# Patient Record
Sex: Male | Born: 1945 | Race: White | Hispanic: No | Marital: Single
Health system: Midwestern US, Community
[De-identification: ages and names within clinical notes are randomized; demographics above are authoritative.]

## PROBLEM LIST (undated history)

## (undated) ENCOUNTER — Emergency Department (HOSPITAL_COMMUNITY): Payer: Medicare Other | Source: Home / Self Care

## (undated) DIAGNOSIS — K219 Gastro-esophageal reflux disease without esophagitis: Secondary | ICD-10-CM

## (undated) DIAGNOSIS — K631 Perforation of intestine (nontraumatic): Secondary | ICD-10-CM

## (undated) DIAGNOSIS — E785 Hyperlipidemia, unspecified: Secondary | ICD-10-CM

## (undated) DIAGNOSIS — M109 Gout, unspecified: Secondary | ICD-10-CM

## (undated) DIAGNOSIS — J449 Chronic obstructive pulmonary disease, unspecified: Secondary | ICD-10-CM

## (undated) DIAGNOSIS — K635 Polyp of colon: Secondary | ICD-10-CM

## (undated) DIAGNOSIS — I4891 Unspecified atrial fibrillation: Secondary | ICD-10-CM

## (undated) DIAGNOSIS — C3490 Malignant neoplasm of unspecified part of unspecified bronchus or lung: Secondary | ICD-10-CM

## (undated) DIAGNOSIS — K5790 Diverticulosis of intestine, part unspecified, without perforation or abscess without bleeding: Secondary | ICD-10-CM

## (undated) DIAGNOSIS — E119 Type 2 diabetes mellitus without complications: Secondary | ICD-10-CM

## (undated) DIAGNOSIS — I2510 Atherosclerotic heart disease of native coronary artery without angina pectoris: Secondary | ICD-10-CM

## (undated) DIAGNOSIS — D649 Anemia, unspecified: Secondary | ICD-10-CM

## (undated) DIAGNOSIS — R911 Solitary pulmonary nodule: Secondary | ICD-10-CM

## (undated) DIAGNOSIS — K5900 Constipation, unspecified: Secondary | ICD-10-CM

## (undated) DIAGNOSIS — E109 Type 1 diabetes mellitus without complications: Secondary | ICD-10-CM

## (undated) DIAGNOSIS — R109 Unspecified abdominal pain: Secondary | ICD-10-CM

## (undated) DIAGNOSIS — D509 Iron deficiency anemia, unspecified: Secondary | ICD-10-CM

## (undated) DIAGNOSIS — K529 Noninfective gastroenteritis and colitis, unspecified: Secondary | ICD-10-CM

## (undated) DIAGNOSIS — D500 Iron deficiency anemia secondary to blood loss (chronic): Secondary | ICD-10-CM

## (undated) DIAGNOSIS — R140 Abdominal distension (gaseous): Secondary | ICD-10-CM

## (undated) DIAGNOSIS — R1033 Periumbilical pain: Secondary | ICD-10-CM

## (undated) DIAGNOSIS — R1013 Epigastric pain: Secondary | ICD-10-CM

## (undated) DIAGNOSIS — K30 Functional dyspepsia: Secondary | ICD-10-CM

## (undated) DIAGNOSIS — R69 Illness, unspecified: Secondary | ICD-10-CM

## (undated) DIAGNOSIS — K5909 Other constipation: Secondary | ICD-10-CM

## (undated) HISTORY — DX: Anemia, unspecified: D64.9

## (undated) HISTORY — DX: Atherosclerotic heart disease of native coronary artery without angina pectoris: I25.10

## (undated) HISTORY — DX: Polyp of colon: K63.5

## (undated) HISTORY — DX: Type 2 diabetes mellitus without complications: E11.9

## (undated) HISTORY — DX: Malignant neoplasm of unspecified part of unspecified bronchus or lung: C34.90

## (undated) HISTORY — PX: ROTATOR CUFF REPAIR: SHX139

## (undated) HISTORY — DX: Hyperlipidemia, unspecified: E78.5

## (undated) HISTORY — DX: Diverticulosis of intestine, part unspecified, without perforation or abscess without bleeding: K57.90

## (undated) HISTORY — DX: Gout, unspecified: M10.9

## (undated) HISTORY — PX: LUNG CANCER SURGERY: SHX702

## (undated) HISTORY — PX: ABDOMINAL SURGERY: SHX537

## (undated) HISTORY — PX: TONSILLECTOMY: SUR1361

## (undated) HISTORY — DX: Chronic obstructive pulmonary disease, unspecified: J44.9

## (undated) HISTORY — DX: Unspecified atrial fibrillation: I48.91

## (undated) HISTORY — DX: Solitary pulmonary nodule: R91.1

## (undated) HISTORY — DX: Gastro-esophageal reflux disease without esophagitis: K21.9

## (undated) HISTORY — DX: Perforation of intestine (nontraumatic): K63.1

---

## 2001-09-13 HISTORY — PX: CORONARY ANGIOPLASTY WITH STENT PLACEMENT: SHX49

## 2015-12-31 LAB — HM COLONOSCOPY

## 2016-01-26 NOTE — Progress Notes (Addendum)
Visit Type:  Nurse Visit  Primary Provider:  [name]    Chief Complaint:  Pill Cam.    History of Present Illness:  Was seen in the office today for capsule endoscopy. Consent form was signed. Sucessful application of data recorder with belt and leads placed. Pt was able to swallow the capsule without difficulty, instructions were given to patient for activities, water, diet intake, in which understanding was voiced. Pt agreed to return to the clinic 8 hours from time of ingesting for removal of data recorder. Pt to follow up with referring physician. Capsule results to be reported after viewing images.  .................................Marland KitchenLanier Ensign [facility]  Jan 26, 2016 8:14 AM    Findings: Gastritis. 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 Incomplete and sub optimal exam due to intra luminal contents obscuring mucosa and the fact that the capsule did not enter the colon at the end of the study.    Plan: Ms. Maury Dus advise the patient to proceed to the ER if he develops any symptoms of retention/SBO, to have an AXR in 2 weeks and to follow up with Dr Kelli Churn for repeat egd and colonoscopy if Dr Kelli Churn believes clinically indicated.     Vital Signs:    Patient Profile:    70 Years Old Male                          Orders Added:  1)  Nurse/MA Visit [CPT-nurse/MA visit]  2)  GI Tract Capsule Imaging [[id number]]              Additional Clinical Support Documentation          I have either personally documented or reviewed the patients past medical, surgical, family and social history and review of systems if present in the note and when recorded by clinical staff.             Electronically Signed by Duard Larsen MD on 01/26/2016 at 5:17 PM  Electronically Signed by Lamar Benes APRN on 01/26/2016 at 6:02 PM  Electronically Signed by Bennett Scrape MD on 01/26/2016 at 6:36 PM  ________________________________________________________________________

## 2016-03-01 NOTE — Progress Notes (Addendum)
Visit Type:  Follow-up Visit  Primary Provider:  Dr. Renard [name]    Chief Complaint:  pill cam.    History of Present Illness:  A 70 year old male presents today for a follow up on his pill cam.................................Marland Kitchen.Paul DubinKristen [name] CMA  March 01, 2016 9:20 AM    Here for follow up to review Pill Camera findings which ws done to assess for iron def anemia of unknown etiology - the pill did not pass into the colon upon review and the pt was contacted to advise of red flags for seek emergent care immediately or call 911 for abd pains; he denies ever developing any pain, and KUB was unrevealing for retained capsule.   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 Incomplete and sub optimal exam due to intra luminal contents obscuring mucosa and the fact that the capsule did not enter the colon at the end of the study.     He is currently followed by Paul [name] for the iron def - has rec'd PRBC and IV Ferrolicit while hosp 12/2015, and recently rec'd additional IV Ferrlicit; pt reports the plan is the repeat labs to assess status of iron studies. Has follow up appt with Paul [name] 03/09/16 to review.  I discussed with pt that if the labs do show ongoing decrease, suggestive of active bleeding, he may need repeat EGD/Colonoscopy.   Pt is advised that per review of the recommendation from Paul [name], pt is to return to Paul [name] if consideration of repeat EGD/Colonoscopy - pt is adamant today, that he [name] - asks for follow up with our office. [name] and is very happy with this office.   Currently, pt feels well, good energy, no malaise or fatigue or headache; denies SOB, Chest Pain or Palpitations; he just returned from driving down to [place] to see his grandchild graduate - he  is currently power washing and painting in his house, mows the lawn and maintains general household chores w/o difficulty.    Takes antiplatelet and 81mg  ASA for significant cardiac hx. Avoids all other NSAIDs - reports taking PPI appropriately.  Denies any change in color of stools, no nausea, vomiting, or diarrhea; no melena, no bleeding.   He does have hx of gastric ulcers in 2013, but 12/2015 EGD/Colonoscopy([place]) reveals only mild chemical gastropathy; Colonoscopy - adenomatous polyps -   ROS is otherwise benign except as noted in HPI    Impression:  70 yo male with complex medical hx, anti-platelet tx, recent hx of profound anemia. No obvious bleeding site on testing so far.     Plan:   1- Pt has follow up with hematology for assessment of his s/p Ferrolicit and transfusions, to assess for ongoing or recurrent blood loss; Will plan have pt follow up with Paul [name] in 3 months for eval with discussion that we may plan for sooner appt with likely repeat of the EGD/Colonoscopy, if his labs demonstrate ongoing occult blood loss. I also  reviewed with pt that possible repeat of the Pill Camera may be necessary if blood loss is ongoing, neg pt is aware of recommended repeat interval of 3-5 yrs and is advised that our office will not call to remind her/him.EGD/Colonoscopy - as the previous study was incomplete for exam of the entire small bowel.   \\RTC in 3 month(s) to re-assess - sooner as warranted.  CC: Paul Renard [name]  Patient Health Questionnaire  PHQ-2 Assessment Not Done: Not Indicated  PHQ-9 Assessment Not Done: Not Indicated    Problems:    Added new problem of BODY MASS INDEX (BMI) 28.0-28.99, ADULT (ICD-V85.24) (SWF09-N23.55)      Past Medical History:     Afibrillation     Diverticulosis     DM     Dyslipidemia     IBS     Severe iron deficiency - infusions 01/2016     Blood transions - 2017 for profound anemia     Ferrolicit infusions - 4 & 01/2016         Past Surgical History:     Right middle lobe resection October 2015     Bowel Resection 1994     Cardiac Catherization     Diagnostic Bronchoscopy      EGD - 2013 - gastric ulcers; 2017 gastritis, no bleeding     Colonoscopy - 2013 - recommended  5 yr follow up; repeat 12/2015 - villous adenoma sigmoid, tubular adenoma of transverse colon, ileum and ascending colon - CD3 immunostain neg.     Family History Summary:      Reviewed history Last on 01/12/2016 and no changes required:03/01/2016      General Comments - FH:  Pt was adopted-no family history    Social History:     Reviewed history from 01/08/2016 and no changes required:                Smoking History:        Patient is a former smoker. Quit in Oct 2015        Marital Status: Widowed        Children: no        Occupation: was in [employer].      Risk Factors:     Smoked Tobacco Use:  Former smoker     Cigarettes:  Yes -- 2 pack(s) per day,    Pack-years:  100        Year started:  80        Year quit:  2015        Years Since Last Quit:  2   Alcohol use:  no  Exercise:  no      Vital Signs:    Patient Profile:    70 Years Old Male  Height:     68.5 inches  Weight:     192.0 pounds  BMI:        28.77   ( Added Problem:Body mass index (BMI) 28.0-28.99, adult ( ICD10-Z68.28 ) )  BSA:        2.02  Pulse rate: 72 / minute  Pulse rhythm:   regular  Resp:       16 per minute  BP Sitting: 138 / 78  (left arm)    Cuff size:  large  Patient has a risk of falls? Assessment not done     Reason: Medical contraindication  Patient in pain?    No    Problems: Active problems were reviewed with the patient during this visit.  Medications: Medications were reviewed with the patient during this visit.  Allergies: Allergies were reviewed with the patient during this visit.  No Known Drug Allergy.        Vitals Entered By: Paul [name] CMA (March 01, 2016 9:20 AM)        Patient Instructions:  1)  Your appointment with Paul [name] is June 27 at 1030 AM  -  2)  We will have you follow up with Paul [name] in 3 months - sooner as warranted if Paul [name] finds your iron and blood levels are starting to  decrease. Paul [name] may want to repeat the Colonoscopy and the EGD to further assess.   3)           Additional Clinical Support Documentation    The medication list contains greater than 5 medications and was reviewed with the patient today. Paul [name] [facility]  March 01, 2016 9:04 AM      I have either personally documented or reviewed the patients past medical, surgical, family and social history and review of systems if present in the note and when recorded by clinical staff.             Electronically Signed by Lamar Benes APRN on 03/01/2016 at 10:22 AM  ________________________________________________________________________  OV note faxed/conf to pcp on file.      Electronically Signed by Paul [name] CMA on 03/01/2016 at 11:42 AM  ________________________________________________________________________                    Impression & Recommendations:  per discussion with Paul [name] will repeat the EGD ASAP, and will get  Colonoscopy subsequently - anticipate involved procedure.              Additional Clinical Support Documentation          I have either personally documented or reviewed the patients past medical, surgical, family and social history and review of systems if present in the note and when recorded by clinical staff.               Electronically Signed by Lamar Benes APRN on 03/01/2016 at 5:31 PM  ________________________________________________________________________

## 2016-03-04 LAB — CBC W/O DIFF
HCT: 43.3 % (ref 40.1–51.0)
HGB: 13.5 GM/DL — ABNORMAL LOW (ref 13.7–17.5)
MCH: 27.3 PG (ref 25.6–32.2)
MCHC: 31.2 G/DL — ABNORMAL LOW (ref 32.2–35.5)
MCV: 87.7 FL (ref 80–95)
MPV: 9.9 FL (ref 9.4–12.4)
NRBC: 0 /100 WBC (ref 0–0.02)
PLATELET: 160 10*3/uL (ref 150–400)
RBC: 4.94 M/ul (ref 4.51–5.93)
RDW: 21.9 % — ABNORMAL HIGH (ref 11.6–14.4)
WBC: 7.8 10*3/uL (ref 4.0–10.0)

## 2016-03-04 LAB — IRON & IRON BINDING
% SATURATION: 22 %
IRON: 66 UG/DL (ref 65–175)
TIBC: 297 UG/DL (ref 250–450)

## 2016-03-04 LAB — FERRITIN: FERRITIN: 55.9 NG/ML (ref 26–388)

## 2016-03-11 NOTE — Progress Notes (Signed)
Visit Type:  Follow-up Visit  Referring Provider:  pcp  Primary Provider:  [name]    Chief Complaint:  Iron Deficiency Anemia.    History of Present Illness:  Pt here for 2 mo f/u; he did complete IV iron x 8 May/June; is feeling better; had repeat labs done.  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  * Hem Hx: Severe anemia (Hb 5.2, Hct 18.8, MCV 68), iron deficiency (TS 3%), mild kidney dysfunction (BUN/Cr 20/1.4), mild Tn elevation (0.167), borderline Vit B12 (392) -> pRBC tfx & IViron.   * EGD/Colonoscopy: gastritis, duodenal AVM, pan-diverticulosis, external hemorrhoids, polyps, normal colonic anastomosis. Capsule study was incomplete. Seeing Dr. Berton Lan and Bonnetta Barry, APRN.    Feeling much better since iv iron, able to [employer]. Breathing is excellent, no dizziness. Denies any melena, hematochezia, chest pain or palpitations. Denies any nosebleeds, gum bleeds, blood in the urine. No excessive bruising. No imbalance or falls recently. No weakness of arms or legs, numbness, tingling. No abd. pain but has gas d/t IBS. No N/V. Has regular BMs. No leg swelling. Taking 1 tb of oral iron. Seems to be tolerating well. Reports night sweats recently, not every night but bad enough to change clothes; this is chronic for him. No fever or chills.          Problems:    Changed problem from BODY MASS INDEX (BMI) 28.0-28.99, ADULT (ICD-V85.24) (ICD10-Z68.28) to OVERWEIGHT (BMI 25-29.9) (ICD-278.02) (ICD10-E66.3)  Assessed OVERWEIGHT (BMI 25-29.9) as unchanged  Assessed IRON DEFICIENCY ANEMIA as unchanged  Assessed CARCINOMA, LUNG, NONSMALL CELL, STAGE IA as unchanged      Family History Summary:       Reviewed history Last on 03/01/2016 and no changes required:03/11/2016      General Comments - FH:  Pt was adopted-no family history      Risk Factors:     Smoked Tobacco Use:  Former smoker     Cigarettes:  Yes -- 2 pack(s) per day,    Pack-years:  100        Year started:  97        Year quit:  2015        Years Since Last Quit:  2      Counseled to quit/cut down:  no  Alcohol use:  no  Exercise:  no    Previous Tobacco Use: Signed On - 03/01/2016  Smoked Tobacco Use:  Former smoker     Cigarettes:  Yes -- 2 pack(s) per day,    Pack-years:  100        Year started:  66        Year quit:  2015        Years Since Last Quit:  2 years, 5 months, 28 days     Counseled to quit/cut down:  no      Review of Systems     See HPI  Gen: (+) night sweats (for years), no anorexia, fatigue, weight loss, sleep disorder.  Eyes: No vision loss, double vision, eye irritation, blurring  ENT: No tinnitus, (+) decreased hearing, no dysphagia, sore throat.   CV: No PND, orthopnea.  Resp: No cough, dyspnea, hemoptysis, (+) occ. wheezing,  no sputum.  GI: No indigestion, bloating  GU: No dysuria, (+) nocturia (3-4), no incontinence.  MSK: No muscle cramps, joint pain or swelling, back pain.  Derm: No suspicious lesions, itching, rash.  Neuro: No difficulty with concentration, headache, tremors.   Endo: No cold or heat intolerance.   Heme: No enlarged lymph nodes      Vital Signs:    Patient Profile:    70 Years Old Male  Height:     68.5 inches  Weight:     194 pounds  BMI:        29.07     BSA:        2.03  O2 Sat:     97 %  Pulse rate: 84 / minute  Pulse rhythm:   regular  Resp:       16 per minute  BP Sitting: 114 / 60  (left arm)    Cuff size:  large  Patient has a risk of falls? Assessment not done     Reason: Not indicated  Patient in pain?    No    Problems: Active problems were reviewed with the patient during this visit.  Medications: Medications were reviewed with the patient during this visit.   Allergies: Allergies were reviewed with the patient during this visit.  No Known Allergy.        Vitals Entered By: Virgil Benedict BSN, RN, OCN (March 09, 2016 10:11 AM)    Problems:    Changed problem from [place] (BMI) 28.0-28.99, ADULT (ICD-V85.24) (ICD10-Z68.28) to OVERWEIGHT (BMI 25-29.9) (ICD-278.02) (ICD10-E66.3)      Physical Exam    General: overweight, BMI 29.3, not in acute distress.    HEENT: PERRL/EOM intact, normal conjunctiva, clear sclera without nystagmus. Grossly normal hearing. Oropharynx clear.  Chest Wall: No tenderness.    Lungs: Clear to auscultation with good air entry bilaterally, no wheezes, rales or rhonchi.    Heart: Regular rate and rhythm, normal S1 and S2, 1-2/6 SEM at apex.  Abdomen: Soft, non-tender with normal bowel sounds; no hepatosplenomegaly, no masses noted.    Extremities: No clubbing, cyanosis or edema.    Neurologic: No focal deficits, cranial nerves II-XII grossly intact with grossly normal sensation, muscle strength and gait.    Skin: No visible petechia, purpura, ecchymosis    Lymph Nodes: No significant adenopathy or tenderness in cervical, axillary, inguinal area.    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  * Severe iron deficiency anemia (Hb 5.2, Hct 18.8, MCV 68, TS 3%) -> Received pRBC x5 units, IV Ferrlicit x2.   * EGD/Colonoscopy: gastritis, duodenal AVM, pan-diverticulosis, external hemorrhoids, polyps, normal colonic anastomosis. Capsule study was recommended. Planned to see Dr. Kelli Churn on 01/27/16.   * Feels well, no new/worsening/constitutional sx's, no bleeding/bruising, tolerating 1 tb of oral iron. Exam unchanged. Lab: improving anemia (Hb 13.5), Ferritin 55, TS 22%    Impr/Plan:  * Severe iron deficiency, likely 2/2 GI blood loss (duodenal AVM, pan-diverticulosis, int/ext. hemorrhoids)  - Significant improvement  w IV iron  - I will see him in 3 months with repeat labs, sooner if concerns, questions    His updated medication list for this problem includes:     Ferrous Sulfate 325 (65 Fe) Mg Tabs (Ferrous sulfate) ..... Once a day    Orders:  OV Est - Level III ([id number])  SCHEDULE A 3 MONTH FOLLOW UP (17MONTH)    Future Orders:  CBC w/o Diff ([id number]) ... 06/09/2016  Ferritin ([id number]) ... 06/09/2016  Total Iron Binding Capacity /TIBC (CPT-83550/83540) ... 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  - Has been followed by Dr. Estill Bamberg from the pulmonary [facility] at [facility]  - Last evaluation was on April 19 and Chest CT from the same day showed no evidence of residual or recurrent disease.   - He has no e/o recurrence by H&P and recent CT scan  - He is willing to continue his surveillance CT scans through Dr. Estill Bamberg.      Problem # 3:  OVERWEIGHT (BMI 25-29.9) (ICD-278.02) (ICD10-E66.3)  Assessment: Unchanged    Your BMI is above normal parameters.  - Decrease caloric intake.  - Follow up with your Primary Care Team.      Medications Added to Medication List This Visit:  1)  Ferrous Sulfate 325 (65 Fe) Mg Tabs (Ferrous sulfate) .... Once a day          Patient Instructions:  1)  I will see you in 3 months with repeat blood work but please call sooner with concerns, questions.  2)  Please continue to follow up with your primary care physician for your other medical problems.    Process Orders  Check Orders Results:       [facility]: ABN not required for this insurance.  Tests Sent for requisitioning (March 09, 2016 10:57 AM):      06/09/2016: [facility] -- CBC w/o Diff [[id number]] (signed)          Dx I10: ICD10-D50.9      09/27/2017[facility] -- Ferritin [[id number]] (signed)          Dx I10: ICD10-D50.9      06/09/2016: [facility] -- Total Iron Binding Capacity [place] [CPT-83550/83540] (signed)          Dx I10: ICD10-D50.9    PSC order (Req# [id number]) sent to [facility]. Order transmitted March 09, 2016 10:57 AM      cc: Dr Tamala Fothergill  Additional Clinical Support Documentation  The medication list contains greater than 5 medications and was reviewed with the patient today. Virgil Benedict BSN, RN, OCN  March 09, 2016 10:11 AM      I have either personally documented or reviewed the patients past medical, surgical, family and social history and review of systems if present in the note and when recorded by clinical staff.             Electronically Signed by Bennett Scrape MD on 03/11/2016 at 5:50 PM  ________________________________________________________________________

## 2016-04-12 NOTE — Procedures (Signed)
[place] [address] [phone]      ENDOSCOPY REPORT    Patient Name: Paul [name], Paul [name]. Medical Record Number: [id number]  Account Number: [id number] DOB: 10 / 23 / 1947  Admit Date: 07 / 31 / 2017 Discharge Date: 07 / 31 / 2017        REFERRING PHYSICIAN:  Karle Starch, MD.    PROCEDURES:  1.  Esophagogastroduodenoscopy (EGD) with snare polypectomy.  2.  Colonoscopy with snare polypectomy.    DATE:  04/12/2016.    INDICATION FOR THE PROCEDURES:  The patient underwent an upper endoscopy and colonoscopy for unexplained anemia   due to chronic GI blood loss.  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  Not removed at the time of those procedures; as they were done   for, what was presumed to be, GI bleeding.  The patient was counseled to   consider repeat procedures and elected to have both procedures for the removal   of these lesions.    IMPRESSION:  1.  EGD:  Seven sessile duodenal polyps removed.  Mild nodular antral  gastritis.  The duodenal arteriovenous malformations were not visualized on  this exam.  2.  Colonoscopy:  Six subcentimeter sessile polyps removed.  Pan  diverticulosis, left much greater than right.  Status post sigmoid resection   with normal-appearing anastomosis.  External hemorrhoids.  Inadequate   preparation.    RECOMMENDATIONS:  1.  Will require a followup colonoscopy with adequate preparation in 1 year.  The patient should be advised strongly to avoid any fruits, vegetables, or  salads for 1 week prior to next procedure.  2.  Will repeat endoscopy in 3 to 6 months to ensure completion of  polypectomies and take out any remaining polyps.  3.  Three clips placed in the duodenal bulb.  MRI precautions.  The patient to   resume Eliquis tomorrow.  Advised of the increased risk of bleeding.    HISTORY AND PHYSICAL:  Documented.  No change immediately prior to the procedures.    CONSENT:   The indications, benefits, risks and alternatives of the procedures were   discussed with the patient. The risks, including (but not limited to)  infection, bleeding, pneumonia, need for intubation, the risk of perforation,   the risk of injuring the liver or spleen, the need for surgery, as well as the   risk of missing lesions (such as polyps and cancer), were discussed.  The   increased risks due to chronic anticoagulation were emphasized.  Consent for   anesthesia was obtained by the attending anesthesiologist.    PREPARATION AND MEDICATIONS:  Per Anesthesia.    Procedures performed in the endoscopy unit.    PROCEDURE #1:  EGD  The patient was placed in the left lateral decubitus position.  A bite block  was placed.  After induction of anesthesia, a gastroscope was introduced into   the mouth and advanced carefully under direct visualization. The mucosa of the   hypopharynx and esophagus appeared normal.  The gastroesophageal junction was  at the 40 cm mark and appeared normal.  The scope was advance to the stomach,   where mucosal and inflammatory nodules were noted in the antrum.  No ulcers or   masses in the forward view or upon retroflexion.  The scope was then   straightened and advanced through a normal and patent pylorus to the second   portion of the  duodenum.  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  I could not visualize the arteriovenous malformations   seen on the prior exam.  No active bleeding was noted.  Seven sessile polyps   measuring between 2 mm and 1.5 cm were removed.  The 2 small polyps were  removed by cold snare, and the larger polyps removed by hot snare.  The largest   polyp was 1.5 cm in size, sessile, and had to be removed in a piecemeal fashion   using the hot snare.  A cold snare had to be used to remove any visible    adenomatous tissue that remained after snare polypectomy.  Two clips were  placed at the site to prevent bleeding.  This was on the inferior aspect of the   duodenal bulb.  On the superior anterior aspect of the duodenal bulb, another   polyp measuring about 7 mm in size on a narrow base was removed by hot snare  and a clip placed to prevent bleeding.  The postpolypectomy appearance of the   sites was then satisfactory.  No active bleeding was noted.  One of the polyps   had to be retrieve using a Roth net.  The procedure was then terminated.    Specimens:  Duodenal polyps x7.  Blood loss:  Not applicable.  Complications:  There appeared to be no immediate complications from the upper   endoscopy.    PROCEDURE #2:  Colonoscopy  The patient was turned around.  While still in the left lateral decubitus   position, a rectal examination was performed and no masses were palpable.  An   adult colonoscope was introduced into the rectum and advanced to the cecum   without assistance.  The endoscope was then withdrawn in a controlled fashion   for careful examination.  Representative photographs were taken for   photodocumentation purposes. No polyps visualized in the cecum, ascending  colon, or hepatic flexure.  Scattered in the transverse colon were 5 sessile   polyps measuring between 3 mm and 6 mm removed by cold snare and retrieved for   pathological examination.  No polyps visualized in the splenic flexure,   descending colon, sigmoid colon.  In the rectum, there was a sessile 4 mm polyp   removed by cold snare and retrieved for pathological examination.  The   postpolypectomy appearance of the sites was satisfactory.  Examination was  quite limited from the splenic flexure onwards due the presence of a large   volume of liquid stool and vegetable matter.  This can be considered an   inadequate from the distal transverse all the way to the rectosigmoid.    Retroflexion in the rectum revealed external hemorrhoids.  The endoscope was   straightened, air suctioned, and the procedure terminated.  The preparation   cannot be considered adequate.    In time:  4 minutes.  Withdrawal time:  10 minutes.  Specimens:  1.  Right colon polyps x5.  2.  Left colon polyp x1.  Blood loss:  Not applicable.  Complications:  There appeared to be no immediate complications, and the  patient was transferred to the recovery area.    ______________________________  Bing Ree, MD    PVG/nls  D:  04/12/2016  01:38 PM  T:  04/12/2016  07:41 PM  DVI Job #:  [id number]  Fusion Job #:  [id number]  Doc#:  [id number]    CC: Valetta Fuller, MD  [address]  [address], [place] [address]  Fax#:  [phone]  Karle [place]arch[place] [address]    Electronically Authenticated and Edited by:  Valetta Fuller, MD On 04/13/2016 06:55 AM EDT

## 2016-04-12 NOTE — Procedures (Signed)
[place] [address] [phone]      ENDOSCOPY REPORT    Patient Name: Paul [name], Paul [name]. Medical Record Number: [id number]  Account Number: [id number] DOB: 10 / 23 / 1947  Admit Date: 07 / 31 / 2017 Discharge Date: 07 / 31 / 2017        REFERRING PHYSICIAN:  Karle Starch, MD.    PROCEDURES:  1.  Esophagogastroduodenoscopy (EGD) with snare polypectomy.  2.  Colonoscopy with snare polypectomy.    DATE:  04/12/2016.    INDICATION FOR THE PROCEDURES:  The patient underwent an upper endoscopy and colonoscopy for unexplained anemia   due to chronic GI blood loss.  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  Not removed at the time of those procedures; as they were done   for, what was presumed to be, GI bleeding.  The patient was counseled to   consider repeat procedures and elected to have both procedures for the removal   of these lesions.    IMPRESSION:  1.  EGD:  Seven sessile duodenal polyps removed.  Mild nodular antral  gastritis.  The duodenal arteriovenous malformations were not visualized on  this exam.  2.  Colonoscopy:  Six subcentimeter sessile polyps removed.  Pan  diverticulosis, left much greater than right.  Status post sigmoid resection   with normal-appearing anastomosis.  External hemorrhoids.  Inadequate   preparation.    RECOMMENDATIONS:  1.  Will require a followup colonoscopy with adequate preparation in 1 year.  The patient should be advised strongly to avoid any fruits, vegetables, or  salads for 1 week prior to next procedure.  2.  Will repeat endoscopy in 3 to 6 months to ensure completion of  polypectomies and take out any remaining polyps.  3.  Three clips placed in the duodenal bulb.  MRI precautions.  The patient to   resume Eliquis tomorrow.  Advised of the increased risk of bleeding.    HISTORY AND PHYSICAL:  Documented.  No change immediately prior to the procedures.    CONSENT:  The indications, benefits, risks and alternatives of  the procedures were   discussed with the patient. The risks, including (but not limited to)  infection, bleeding, pneumonia, need for intubation, the risk of perforation,   the risk of injuring the liver or spleen, the need for surgery, as well as the   risk of missing lesions (such as polyps and cancer), were discussed.  The   increased risks due to chronic anticoagulation were emphasized.  Consent for   anesthesia was obtained by the attending anesthesiologist.    PREPARATION AND MEDICATIONS:  Per Anesthesia.    Procedures performed in the endoscopy unit.    PROCEDURE #1:  EGD  The patient was placed in the left lateral decubitus position.  A bite block  was placed.  After induction of anesthesia, a gastroscope was introduced into   the mouth and advanced carefully under direct visualization. The mucosa of the   hypopharynx and esophagus appeared normal.  The gastroesophageal junction was  at the 40 cm mark and appeared normal.  The scope was advance to the stomach,   where mucosal and inflammatory nodules were noted in the antrum.  No ulcers or   masses in the forward view or upon retroflexion.  The scope was then   straightened and advanced through a normal and patent pylorus to the second   portion of the  duodenum.  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  I could not visualize the arteriovenous malformations   seen on the prior exam.  No active bleeding was noted.  Seven sessile polyps   measuring between 2 mm and 1.5 cm were removed.  The 2 small polyps were  removed by cold snare, and the larger polyps removed by hot snare.  The largest   polyp was 1.5 cm in size, sessile, and had to be removed in a piecemeal fashion   using the hot snare.  A cold snare had to be used to remove any visible   adenomatous tissue that remained after snare polypectomy.  Two clips were  placed at the site to prevent  bleeding.  This was on the inferior aspect of the   duodenal bulb.  On the superior anterior aspect of the duodenal bulb, another   polyp measuring about 7 mm in size on a narrow base was removed by hot snare  and a clip placed to prevent bleeding.  The postpolypectomy appearance of the   sites was then satisfactory.  No active bleeding was noted.  One of the polyps   had to be retrieve using a Roth net.  The procedure was then terminated.    Specimens:  Duodenal polyps x7.  Blood loss:  Not applicable.  Complications:  There appeared to be no immediate complications from the upper   endoscopy.    PROCEDURE #2:  Colonoscopy  The patient was turned around.  While still in the left lateral decubitus   position, a rectal examination was performed and no masses were palpable.  An   adult colonoscope was introduced into the rectum and advanced to the cecum   without assistance.  The endoscope was then withdrawn in a controlled fashion   for careful examination.  Representative photographs were taken for   photodocumentation purposes. No polyps visualized in the cecum, ascending  colon, or hepatic flexure.  Scattered in the transverse colon were 5 sessile   polyps measuring between 3 mm and 6 mm removed by cold snare and retrieved for   pathological examination.  No polyps visualized in the splenic flexure,   descending colon, sigmoid colon.  In the rectum, there was a sessile 4 mm polyp   removed by cold snare and retrieved for pathological examination.  The   postpolypectomy appearance of the sites was satisfactory.  Examination was  quite limited from the splenic flexure onwards due the presence of a large   volume of liquid stool and vegetable matter.  This can be considered an   inadequate from the distal transverse all the way to the rectosigmoid.   Retroflexion in the rectum revealed external hemorrhoids.  The endoscope was   straightened, air suctioned, and the procedure terminated.  The preparation   cannot be  considered adequate.    In time:  4 minutes.  Withdrawal time:  10 minutes.  Specimens:  1.  Right colon polyps x5.  2.  Left colon polyp x1.  Blood loss:  Not applicable.  Complications:  There appeared to be no immediate complications, and the  patient was transferred to the recovery area.    ______________________________  Valetta Fuller, MD    PVG/nls  D:  04/12/2016  01:38 PM  T:  04/12/2016  07:41 PM  DVI Job #:  [id number]  Fusion Job #:  [id number]  Doc#:  [id number]    CC: Valetta Fuller, MD  [address]  [address], [place] [address]  Fax#:  [phone]  Karle [place]arch[place] [address]    Electronically Authenticated and Edited by:  Valetta Fuller, MD On 04/13/2016 06:55 AM EDT

## 2016-04-23 LAB — HM COLONOSCOPY

## 2016-06-02 LAB — CBC W/O DIFF
HCT: 42.9 % (ref 40.1–51.0)
HGB: 14.1 GM/DL (ref 13.7–17.5)
MCH: 29.6 PG (ref 25.6–32.2)
MCHC: 32.9 G/DL (ref 32.2–35.5)
MCV: 90.1 FL (ref 80–95)
MPV: 10.2 FL (ref 9.4–12.4)
NRBC: 0 /100 WBC (ref 0–0.02)
PLATELET: 126 10*3/uL — ABNORMAL LOW (ref 150–400)
RBC: 4.76 M/ul (ref 4.51–5.93)
RDW: 15.5 % — ABNORMAL HIGH (ref 11.6–14.4)
WBC: 6.7 10*3/uL (ref 4.0–10.0)

## 2016-06-02 LAB — IRON & IRON BINDING
% SATURATION: 18 %
IRON: 58 UG/DL — ABNORMAL LOW (ref 65–175)
TIBC: 324 UG/DL (ref 250–450)

## 2016-06-02 LAB — FERRITIN: FERRITIN: 25.1 NG/ML — ABNORMAL LOW (ref 26–388)

## 2016-06-04 NOTE — Progress Notes (Addendum)
Visit Type:  Follow-up Visit  Primary Provider:  [name]    Chief Complaint:  iron deficiency and anemia.    History of Present Illness:  70 year old male presents today for 3 month follow up iron deficient and ameic................................Marland KitchenRada Hay CMA  June 04, 2016 9:21 AM    Follow up: Here for follow up after CTE, capsule endoscopy, pan endoscopy done for obscure occult gi bleeding and severe anemia in the setting of chronic anticoagulation. Found to have non bleeding duodenal arteriovenous malformations. Followed by Dr. [name] and is on oral iron. Cbc is normal but continues to have low iron studies. No overt gi bleeding. Multiple adenomatous duodenal and colon polyps removed. Could not visualize duodenal avm on 03/2016. Scheduled for repeat Nov 2017. 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   ROS: Reports enlarged glands in neck. Has seen ENT      Labs: reviewed in centricity  Imaging: reviewed report of CTE, capsule endoscopy  Records: reviewed visits with Ms. Ballentine, Dr. Lynett Grimes, egd and colonoscopy    Impression: 70 year old male patient with the following GI issues  1. 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 On oral iron. Has recieved iv iron as well. H&H stable but iron stores low. Follows with Dr. [name]. No overt gi bleeding. Worsened by being on chronic anticoagulation.  2. Adenomatous duodenal polyps: surveillance scheduled for 07/2016  3. Adenomatous colon polyps- several removed 03/2016. Sub optimal prep. Repeat in 2018/2019  4. Small intestinal arteriovenous malformations  5. Abnorma CT scan abdomen/ abnormal GB appearance- non specific. No RUQ pain. Normal LFTs.  6. Anal seepage: Rectoanal agnosia. ? related to constipation/ fecal impaction. Is on miralax and iron.   Colonoscopy negative for iBD/tumor.  7. Fatty liver- NAFLD. Has risk factors. Will check liver panel     Your BMI is above normal parameters.  - Decrease caloric intake.  - Increase exercise.  - Consider additional dietary counseling.  - Get 7-8 hrs of sleep a night  - Follow up with your Primary Care Team.   Discussion: Discussed trial of fiber, continued use of miralax and an AXR. Discussed need for endoscopy.   Plan:  1. EGD 07/2016  2. Colonoscopy 2018/19  3. AXR. Trial of fiber. Prep is X ray suggests needs.  4. Liver panel    Counseling dominated visit of 25 minutes, with 20 minutes spent in face to face counseling and included discussion of care coordination, management advice, education and various treatment options as outlined above.    RTC 6 months    Patient Health Questionnaire  PHQ-2 Assessment Not Done: Not Indicated  PHQ-9 Assessment Not Done: Not Indicated    Problems:    Added new problem of FECAL INCONTINENCE WITH FECAL SMEARING (GEX-528.41) (ICD10-R15.0) - Signed  Added new problem of NON-ALCOHOLIC FATTY LIVER (LKG-401.0) (ICD10-K76.0) - Signed      Past Medical History:     Reviewed history from 03/01/2016 and no changes required:        Afibrillation        CAD sp MI        COPD; quit tobacco smoking        Right lung cancer sp sx 2015        DM        Dyslipidemia        IBS        Severe iron deficiency - infusions 01/2016  Blood transions - 2017 for profound anemia        Duodenal AVMs        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        Serrated adenomatous colon polyps; due 2018/19        Hiatal hernia        Diverticulosis colon        Elevated BMI            Past Surgical History:     Reviewed history from 03/01/2016 and no changes required:        Right middle lobe resection October 2015        Bowel Resection 1994        Cardiac Catherization        PTCA        Diagnostic Bronchoscopy        EGD - 2013 - gastric ulcers; 2017 gastritis, adenomtous duodenal polyps, no bleeding        Colonoscopy - 2013 - recommended  5 yr follow up; repeat 12/2015 -  villous adenoma sigmoid, tubular adenoma of transverse colon, ileum and ascending colon - CD3 immunostain neg.     Family History Summary:      Reviewed history Last on 03/09/2016 and no changes required:06/04/2016      General Comments - FH:  Pt was adopted-no family history    Social History:     Reviewed history from 01/08/2016 and no changes required:                Smoking History:        Patient is a former smoker. Quit in Oct 2015        Marital Status: Widowed        Children: no        Occupation: was in [employer] photographer.      Risk Factors:     Smoked Tobacco Use:  Former smoker     Cigarettes:  Yes -- 2 pack(s) per day,    Pack-years:  100        Year started:  55        Year quit:  2015        Years Since Last Quit:  2      Counseled to quit/cut down:  no  Alcohol use:  no  Exercise:  no        Vital Signs:    Patient Profile:    70 Years Old Male  Height:     68.5 inches  Weight:     195 pounds  BMI:        29.22     Pulse rate: 75 / minute  Pulse rhythm:   regular  Resp:       14 per minute  BP Sitting: 100 / 72  (left arm)    Cuff size:  regular  Patient has a risk of falls? Assessment not done     Reason: Medical contraindication  Patient in pain?    No    Problems: Active problems were reviewed with the patient during this visit.  Medications: Medications were reviewed with the patient during this visit.  Allergies: Allergies were reviewed with the patient during this visit.  No Known Allergy.  No Known Drug Allergy.        Vitals Entered By: Rada Hay CMA (June 04, 2016 9:22 AM)        Blood Pressure:  Today's BP: 100/72 mm Hg  Medications Added to Medication List This Visit:  1)  Albuterol Sulfate (2.5 Mg/88m) 0.083% Nebu (Albuterol sulfate) .... Take 3 mls by nebulization every 4 hours as needed for wheezing  2)  Ultram 50 Mg Tabs (Tramadol hcl) .... Take 1 tablet by mouth every 6 hours as needed  3)  Trazodone Hcl 100 Mg Tabs (Trazodone hcl) .... Take one before bedtime.   4)  Glucophage 850 Mg Tabs (Metformin hcl) .... Take one tablet by mouth daily.  5)  Symbicort 160-4.5 Mcg/act Aero (Budesonide-formoterol fumarate) .... Take as directed  6)  Bentyl 20 Mg Tabs (Dicyclomine hcl) .... Take 1 tablet by mouth every 6  hours as needed  7)  Cozaar 25 Mg Tabs (Losartan potassium) .... Take one tablet by mouth daily.  8)  Eliquis 5 Mg Tabs (Apixaban) .... Take one tablet by mouth twice daily.  9)  Atorvastatin Calcium 40 Mg Tabs (Atorvastatin calcium) .... Take one tablet by mouth daily.  10)  Aspirin Ec Low Dose 81 Mg Tbec (Aspirin) .... Take one tablet by mouth daily.  11)  Calcium Carbonate-vitamin D 500-400 Mg-unit Tabs (Calcium carbonate-vitamin d) .... Take one tablet by mouth daily.  12)  Protonix 40 Mg Tbec (Pantoprazole sodium) .... Take one tablet by mouth daily.          Patient Instructions:  1)  Citrucel or Benefiber powder 2 teaspoons in 8 ounces water/liquid daily.     Process Orders  Check Orders Results:      [facility] not required for this insurance.  Tests Sent for requisitioning (June 04, 2016 9:40 AM):      06/04/2016: [facility] Liver (Hepatic) Panel [[[id number]](signed)          Dx I10: IGXQ11-H41.7   [place] (Req# [id number] sent to [facility] Order transmitted June 04, 2016 9:40 AM        Additional Clinical Support Documentation      The medication list contains greater than 5 medications and was reviewed with the patient today. KThompson June 04, 2016 9:22 AM    I have either personally documented or reviewed the patients past medical, surgical, family and social history and review of systems if present in the note and when recorded by clinical staff.       cc: Preethi Rajanna      Electronically Signed by PDuard LarsenMD on 06/04/2016 at 9:44 AM  ________________________________________________________________________  ov note faxed/conf to pcp on file.       Electronically Signed by KLanier EnsignCMA on 06/04/2016 at 9:54 AM  ________________________________________________________________________

## 2016-06-15 LAB — CREATININE AND GFR
Creatinine: 1.4 mg/dL — ABNORMAL HIGH (ref 0.7–1.3)
Estimated GFR: 50 mL/min/{1.73_m2} — ABNORMAL LOW (ref >60)

## 2016-06-15 LAB — HEPATIC FUNCTION PANEL
ALT (SGPT): 30 IU/L (ref 16–61)
AST (SGOT): 19 IU/L (ref 15–37)
Albumin: 3.8 G/DL (ref 3.4–5.0)
Alk. phosphatase: 153 U/L — ABNORMAL HIGH (ref 45–117)
BILIRUBIN, DIRECT: 0.19 MG/DL (ref 0.00–0.20)
Bilirubin, total: 0.6 MG/DL (ref 0.2–1.2)
Protein, total: 7 G/DL (ref 6.4–8.2)

## 2016-06-15 LAB — BUN: BUN: 23 MG/DL — ABNORMAL HIGH (ref 7–18)

## 2016-06-15 NOTE — Progress Notes (Signed)
Visit Type:  Follow-up Visit  Referring Provider:  pcp  Primary Provider:  [name]    Chief Complaint:  Iron deficiency anemia.    History of Present Illness:  Pt here for 3 mo f/u; labs done today; he is feeling okay; continues to take oral iron. Continues to f/u with multiple providers; cardiac, GI, pulmonary.  ...............................Marland KitchenVirgil [name] BSN, RN, OCN  June 15, 2016 10:36 AM    70 yo M w h/o Stage 1A, NSCLC s/p RML resection (Oct '15), CAD (s/p stent), dyslipidemia, paroxysmal A.fib (on Eliquis), COPD, T2DM, s/p bowel resection (1994, for bowel obstruction) & iron deficiency anemia (s/p IV iron), here for f/u.  * Hem Hx: Severe anemia (Hb 5.2, Hct 18.8, MCV 68), iron deficiency (TS 3%), mild kidney dysfunction (BUN/Cr 20/1.4), mild Tn elevation (0.167), borderline Vit B12 (392) -> pRBC tfx & IViron.   * EGD/Colonoscopy: gastritis, duodenal AVM, pan-diverticulosis, external hemorrhoids, polyps, normal colonic anastomosis. Capsule study was incomplete. Seeing Dr. Berton Lan and Bonnetta Barry, APRN.    Feeling well, continues w oral iron once a day, tolerating w/o issues. No dyspnea at rest, mild DOE, no CP, palpitations, dizziness. Denies melena, hematochezia, nosebleeds, gum bleeds, hematuria. No excess bruising. No imbalance or falls recently. No weakness of arms or legs, numbness, tingling. 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 No N/V. Has regular BMs. No leg swelling. Continued night sweats, not every night, sometimes bad enough to change clothes; not new. No fever or chills.          Problems:    Assessed IRON DEFICIENCY ANEMIA as unchanged      Family History Summary:      Reviewed history Last on 06/04/2016 and no changes required:06/15/2016  Mother Paul [name].) - Has Family History Unknown - Entered On: 06/15/2016    General Comments - FH:  Pt was adopted-no family history      Risk Factors:     Smoked Tobacco Use:  Former smoker     Cigarettes:  Yes -- 2 pack(s) per day,    Pack-years:  100         Year started:  79        Year quit:  2015        Years Since Last Quit:  2      Counseled to quit/cut down:  no  Alcohol use:  no  Exercise:  no    Previous Tobacco Use: Signed On - 06/04/2016  Smoked Tobacco Use:  Former smoker     Cigarettes:  Yes -- 2 pack(s) per day,    Pack-years:  100        Year started:  55        Year quit:  2015        Years Since Last Quit:  2 years, 9 months, 2 days     Counseled to quit/cut down:  no      Review of Systems     See HPI  Gen: No anorexia, fatigue, weight loss, sleep disorder.  Eyes: No vision loss, double vision, eye irritation, blurring  ENT: No tinnitus, (+) decreased hearing, no dysphagia, sore throat.   CV: No PND, orthopnea.  Resp: No cough, hemoptysis, (+) occ. wheezing, no sputum.  GI: No indigestion, bloating  GU: No dysuria, (+) nocturia (3-4), no incontinence.  MSK: No muscle cramps, joint pain or swelling, back pain.  Derm: No suspicious lesions, itching, rash.  Neuro: No difficulty with concentration, headache, tremors.   Endo: No  cold or heat intolerance.   Heme: No enlarged lymph nodes      Vital Signs:    Patient Profile:    70 Years Old Male  Height:     68.5 inches  Weight:     197 pounds  BMI:        29.51     BSA:        2.04  O2 Sat:     97 %  Pulse rate: 84 / minute  Pulse rhythm:   regular  Resp:       20 per minute  BP Sitting: 130 / 74  (right arm)    Cuff size:  large  Patient has a risk of falls? Assessment not done     Reason: Medical contraindication  Patient in pain?    No    Problems: Active problems were reviewed with the patient during this visit.  Medications: Medications were reviewed with the patient during this visit.  Allergies: Allergies were reviewed with the patient during this visit.  No Known Allergy.        Vitals Entered By: Paul [name] BSN, RN, OCN (June 15, 2016 10:22 AM)      Physical Exam    General: overweight, BMI 29.5, not in acute distress.    HEENT: PERRL/EOM intact, normal conjunctiva, clear sclera without  nystagmus. Grossly normal hearing. Oropharynx clear.  Chest Wall: No tenderness.    Lungs: Clear to auscultation with good air entry bilaterally, no wheezes, rales or rhonchi.    Heart: Regular rate and rhythm, normal S1 and S2, 1-2/6 SEM at apex.  Abdomen: Soft, non-tender with normal bowel sounds; no hepatosplenomegaly, no masses noted.    Extremities: No clubbing, cyanosis or edema.    Neurologic: No focal deficits, cranial nerves II-XII grossly intact with grossly normal sensation, muscle strength and gait.    Skin: No visible petechia, purpura, ecchymosis    Lymph Nodes: No significant adenopathy or tenderness in cervical, axillary, inguinal area.    Psych: Alert, oriented and cooperative        Blood Pressure:  Today's BP: 130/74 mm Hg          Impression & Recommendations:    Problem # 1:  IRON DEFICIENCY ANEMIA (ICD-280.9) (ICD10-D50.9)  Assessment: Unchanged  70 yo M w h/o Stage 1A, NSCLC s/p RML resection (Oct '15), CAD (stenting 2003), dyslipidemia, paroxysmal A.fib (on Eliquis), COPD, T2DM, s/p bowel resection (1994, for bowel obstruction) & iron deficiency anemia (s/p IV iron), here for f/u.  * Severe iron deficiency anemia (Hb 5.2, Hct 18.8, MCV 68, TS 3%) -> Received pRBC x5 units, IV Ferrlicit x2.   * EGD/Colonoscopy: gastritis, duodenal AVM, pan-diverticulosis, external hemorrhoids, polyps, normal colonic anastomosis. Capsule study was performed. Being followed by GI.  * Feels well, some fatigue but no new/worsening/constitutional sx's, no bleeding/bruising, tolerating 1 tb of oral iron. Exam unchanged. Lab: improving anemia (Hb 13.5 -> 14) but iron stores are worsening Ferritin 55 -> 25, TS 22 -> 18%%    Impr/Plan:  * Severe iron deficiency, likely 2/2 GI blood loss (duodenal AVM, pan-diverticulosis, int/ext. hemorrhoids)  - Had improvement in Hb w IV iron but iron stores are worsening  - Will treat w 500 mg of IV iron and repeat labs in 3 months   - I will see him in 6 months with repeat labs, sooner if concerns, questions    His updated medication list for this problem includes:     Ferrous  Sulfate 325 (65 Fe) Mg Tabs (Ferrous sulfate) ..... Once a day    Orders:  OV Est - Level III ([id number])  SCHEDULE A 6 MONTH FOLLOW UP (47MONTH)    Future Orders:  Ferritin ([id number]) ... 09/15/2016  Total Iron Binding Capacity /TIBC (CPT-83550/83540) ... 09/15/2016  CBC w/o Diff ([id number]) ... 09/15/2016  Ferritin ([id number]) ... 12/14/2016  Total Iron Binding Capacity /TIBC (CPT-83550/83540) ... 12/14/2016  CBC w/o Diff ([id number]) ... 12/14/2016    Your BMI is above normal parameters.  - Decrease caloric intake.  - Increase exercise.  - Follow up with your Primary Care Team.    Systolic BP of 025-427 mmHg OR diastolic BP of 06-23 mmHg  Follow up plan:  Recheck blood pressure within one year with your Primary Care Team.  Consider lifestyle modifications such as:  - Increased Physical Activity  - Weight Reduction              Patient Instructions:  1)  I will order IV iron and you will receive a call to schedule.  2)  I have ordered blood work for 09/15/16 and 12/14/16 and will let you know of critical results.  3)  I will see you in 6 months with repeat blood work but please call sooner with concerns, questions.  4)  Please continue to follow up with your primary care physician for your other medical problems.    Process Orders  Check Orders Results:      [facility]: ABN not required for this insurance.  Tests Sent for requisitioning (June 15, 2016 10:58 AM):      09/15/2016: [facility] -- Ferritin [[id number]] (signed)          Dx I10: ICD10-D50.9      09/15/2016: [facility] -- Total Iron Binding Capacity [place] [CPT-83550/83540] (signed)          Dx I10: ICD10-D50.9      09/15/2016: [facility] -- CBC w/o Diff [[id number]] (signed)          Dx I10: ICD10-D50.9       12/14/2016: [facility] -- Ferritin [[id number]] (signed)          Dx I10: ICD10-D50.9      12/14/2016: [facility] -- Total Iron Binding Capacity [place] [CPT-83550/83540] (signed)          Dx I10: ICD10-D50.9      12/14/2016: [facility] -- CBC w/o Diff [[id number]] (signed)          Dx I10: ICD10-D50.9    PSC order (Req# [id number]) sent to [facility]. Order transmitted June 15, 2016 10:58 AM      cc: Dr Tamala Fothergill  cc: Dr Neal Dy    Additional Clinical Support Documentation      The medication list contains greater than 5 medications and was reviewed with the patient today. Paul [name] BSN, RN, OCN  June 15, 2016 10:22 AM    I have either personally documented or reviewed the patients past medical, surgical, family and social history and review of systems if present in the note and when recorded by clinical staff.             Electronically Signed by Bennett Scrape MD on 06/15/2016 at 11:38 AM  ________________________________________________________________________

## 2016-06-22 LAB — GGT: GGT: 67 U/L (ref 15–85)

## 2016-06-22 LAB — AMB EXT HEP C ANTIBODY: Hep C Antibody, External: NONREACTIVE

## 2016-06-22 LAB — CREATININE AND GFR
Creatinine: 1.3 mg/dL (ref 0.7–1.3)
Estimated GFR: 54 mL/min/{1.73_m2} — ABNORMAL LOW (ref >60)

## 2016-06-22 LAB — BUN: BUN: 21 MG/DL — ABNORMAL HIGH (ref 7–18)

## 2016-06-23 LAB — HEPATITIS C ANTIBODY
HEP C AB IGG, 20214: NONREACTIVE
HEP C AB IGG, 20214: REACTIVE

## 2016-06-23 LAB — HEPATITIS B SURFACE ANTIBODY
HEP B SURF AB, 20215: NONREACTIVE
HEP B SURF AB, 20215: REACTIVE

## 2016-06-23 LAB — HEPATITIS B CORE ANTIBODY, TOTAL
HEP BC TOTAL, 20175: NONREACTIVE
HEP BC TOTAL, 20175: POSITIVE

## 2016-06-23 LAB — HEP B SURFACE AG
HEP B SURF AG: NONREACTIVE
HEP B SURF AG: REACTIVE

## 2016-06-23 LAB — HEP B SURFACE AB
HEP B SURF AB: NONREACTIVE
HEP B SURF AB: REACTIVE

## 2016-06-23 LAB — HEP A AB, TOTAL
HEP A TOTAL: NONREACTIVE
HEP A TOTAL: REACTIVE

## 2016-06-23 LAB — CERULOPLASMIN
CERULOPLASMIN,S: 200
CERULOPLASMIN,S: 33.5 — ABNORMAL HIGH

## 2016-06-23 LAB — HCV AB
Hepatitis C Ab, IgG: NONREACTIVE
Hepatitis C Ab, IgG: REACTIVE

## 2016-06-23 LAB — HEPATITIS B CORE AB, TOTAL
HEP BC TOTAL: NONREACTIVE
HEP BC TOTAL: POSITIVE

## 2016-06-24 LAB — MITOCHONDRIAL M2 AB
MITOCHONDRIAL AB: 200
MITOCHONDRIAL AB: <0.1
MITOCHONDRIAL AB: NEGATIVE

## 2016-07-06 LAB — BUN: BUN: 23 MG/DL — ABNORMAL HIGH (ref 7–18)

## 2016-07-06 LAB — CREATININE AND GFR
Creatinine: 1.3 mg/dL (ref 0.7–1.3)
Estimated GFR: 54 mL/min/{1.73_m2} — ABNORMAL LOW (ref >60)

## 2016-07-20 NOTE — Procedures (Signed)
[place] [address] [phone]      ENDOSCOPY REPORT    Patient Name: Paul [name], Paul [name]. Medical Record Number: [id number]   Account Number: [id number] DOB: 10 / 23 / 1947   Admit Date: 11 / 07 / 2017 Discharge Date: 11 / 07 / 2017     REFERRING PHYSICIAN:  Bennett Scrape, MD      PRIMARY CARE PHYSICIAN:  Geralynn Rile, MD    PROCEDURE:  Esophagogastroduodenoscopy with snare polypectomy and biopsy.    DATE:  07/20/2016    INDICATION:  Known adenomatous duodenal polyp.  Upper endoscopy being performed to ensure  completion polypectomy.    IMPRESSION:  1.  Chronic diffuse gastritis with scattered erosions and inflammatory  appearing antral mucosal nodules.  2.  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  3.  Recurrence of adenomatous appearing duodenal polyp on the inferior aspect  of the duodenal bulb.  4.  Previously noted arteriovenous malformations not easily visualized on this  exam.    RECOMMENDATIONS:  Will discuss follow up endoscopy in 6 months with the patient to reassess  duodenal polypectomy site.  Will have the patient resume proton pump inhibitor  which is not on his med rec at this time.    HISTORY AND PHYSICAL:  Documented.  No change immediately prior to the procedure.    CONSENT:  The indications, benefits, risks and alternatives to the procedure were  discussed with the patient.  The risks including but not limited to infection,  bleeding, pneumonia, need for intubation, the risk of perforation, the need for  surgery, as well as the risk of missing lesions were discussed.  Consent for  anesthesia was obtained by the attending anesthesiologist.    PREPARATION MEDICATIONS:  Per Anesthesia.    Procedure performed in the endoscopy unit.    PROCEDURE:  The patient was placed in the left lateral decubitus position.  A bite block  was placed.  After induction of anesthesia, the gastroscope was introduced into  the mouth, advanced carefully  under direct visualization.  The mucosa of the  hypopharynx and esophagus appeared normal.  The gastroesophageal junction was  at the 40 cm mark and appeared normal.  The scope was advanced into the stomach  with chronic diffuse mucosal erythema and evidence of gastritis was  appreciated.  Erosions were scattered throughout the stomach.  Two inflammatory  appearing mucosal nodules on the lesser curvature in the antrum, both of them  with overlying erosions.  No ulcers noted in the forward view or upon  retroflexion.  The scope was then straightened and advanced through a normal  and patent pylorus into the 2nd portion of the duodenum.  Patchy duodenitis was  appreciated.  In the duodenal bulb, multiple mucosal nodules were noted just  beyond the pylorus on all surfaces.  The appearance was more consistent with  Brunner gland hyperplasia.  One large lesion was snared and multiple smaller  lesions were biopsied.  On the inferior aspect of the distal duodenal bulb  there was apparent recurrence of adenomatous appearing tissue.  All visible  tissue that could be removed using a hot snare was removed.  The site was then  treated with the tip of the snare for both hemostasis and ablation of tissue.  The post-polypectomy appearance of the site was then satisfactory and photo  documented.  The procedure was then terminated.    SPECIMENS:  1.  Duodenal mucosal  nodules biopsies.  2.  Duodenal bulb polyp.  3.  Antrum mucosal nodule biopsies.    BLOOD LOSS:  Not applicable.    COMPLICATIONS:  There appeared to be no immediate complications and the patient was transferred  to the [facility].      ______________________________  Valetta Fuller, MD    PVG/mh  D:  07/20/2016  08:05 AM  T:  07/21/2016  11:31 PM  DVI Job #:  [id number]  Fusion Job #:  [id number]  Doc#:  [id number]    CC: Valetta Fuller, MD  [address], [place] [address]  Fax#:  [phone]  Kar[place]  [address]    Electronically Authenticated by:  Valetta Fuller, MD On 07/22/2016 07:52 AM EST

## 2016-07-20 NOTE — Procedures (Signed)
[place] [address] [phone]      ENDOSCOPY REPORT    Patient Name: Paul [name], Paul [name]. Medical Record Number: [id number]   Account Number: [id number] DOB: 10 / 23 / 1947   Admit Date: 11 / 07 / 2017 Discharge Date: 11 / 07 / 2017     REFERRING PHYSICIAN:  Bennett Scrape, MD      PRIMARY CARE PHYSICIAN:  Geralynn Rile, MD    PROCEDURE:  Esophagogastroduodenoscopy with snare polypectomy and biopsy.    DATE:  07/20/2016    INDICATION:  Known adenomatous duodenal polyp.  Upper endoscopy being performed to ensure  completion polypectomy.    IMPRESSION:  1.  Chronic diffuse gastritis with scattered erosions and inflammatory  appearing antral mucosal nodules.  2.  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  3.  Recurrence of adenomatous appearing duodenal polyp on the inferior aspect  of the duodenal bulb.  4.  Previously noted arteriovenous malformations not easily visualized on this  exam.    RECOMMENDATIONS:  Will discuss follow up endoscopy in 6 months with the patient to reassess  duodenal polypectomy site.  Will have the patient resume proton pump inhibitor  which is not on his med rec at this time.    HISTORY AND PHYSICAL:  Documented.  No change immediately prior to the procedure.    CONSENT:  The indications, benefits, risks and alternatives to the procedure were  discussed with the patient.  The risks including but not limited to infection,  bleeding, pneumonia, need for intubation, the risk of perforation, the need for  surgery, as well as the risk of missing lesions were discussed.  Consent for  anesthesia was obtained by the attending anesthesiologist.    PREPARATION MEDICATIONS:  Per Anesthesia.    Procedure performed in the endoscopy unit.    PROCEDURE:  The patient was placed in the left lateral decubitus position.  A bite block  was placed.  After induction of anesthesia, the gastroscope was introduced into   the mouth, advanced carefully under direct visualization.  The mucosa of the  hypopharynx and esophagus appeared normal.  The gastroesophageal junction was  at the 40 cm mark and appeared normal.  The scope was advanced into the stomach  with chronic diffuse mucosal erythema and evidence of gastritis was  appreciated.  Erosions were scattered throughout the stomach.  Two inflammatory  appearing mucosal nodules on the lesser curvature in the antrum, both of them  with overlying erosions.  No ulcers noted in the forward view or upon  retroflexion.  The scope was then straightened and advanced through a normal  and patent pylorus into the 2nd portion of the duodenum.  Patchy duodenitis was  appreciated.  In the duodenal bulb, multiple mucosal nodules were noted just  beyond the pylorus on all surfaces.  The appearance was more consistent with  Brunner gland hyperplasia.  One large lesion was snared and multiple smaller  lesions were biopsied.  On the inferior aspect of the distal duodenal bulb  there was apparent recurrence of adenomatous appearing tissue.  All visible  tissue that could be removed using a hot snare was removed.  The site was then  treated with the tip of the snare for both hemostasis and ablation of tissue.  The post-polypectomy appearance of the site was then satisfactory and photo  documented.  The procedure was then terminated.    SPECIMENS:  1.  Duodenal mucosal  nodules biopsies.  2.  Duodenal bulb polyp.  3.  Antrum mucosal nodule biopsies.    BLOOD LOSS:  Not applicable.    COMPLICATIONS:  There appeared to be no immediate complications and the patient was transferred  to the [facility].      ______________________________  Valetta Fuller, MD    PVG/mh  D:  07/20/2016  08:05 AM  T:  07/21/2016  11:31 PM  DVI Job #:  [id number]  Fusion Job #:  [id number]  Doc#:  [id number]    CC: Valetta Fuller, MD  [address], [place] [address]  Fax#:  [phone]  Karle [place]arch[place] [address]    Electronically Authenticated by:  Valetta Fuller, MD On 07/22/2016 07:52 AM EST

## 2016-08-02 NOTE — Progress Notes (Signed)
Visit Type:  New Patient Visit  Primary Provider:  [name]    Chief Complaint:  Consult gallbladder.    History of Present Illness:  70 year old male presents today for new patient visit to evaluate gallbladder................................Marland KitchenRada Hay CMA  August 02, 2016 1:08 PM    70 yo male referred to my attn by his GI provider, Dr. Berton Lan, and by his PCP.  Pt presents with long-standing GI symptoms, including frequent episodes of left lower abdominal cramping with associated diarrhea.  He also has a history of very frequent, explosive gas which does not seem dependent on what he is eating.  He has a history of severe transfusion dependent iron-deficieny anemia as well.  His workup thus far has been extensive and has included an EGD in 4/17 demonstrating gastritis and a colonoscopy in 7/17 noting benign polyps.  A CT enterography obtained in 5/17 was essentially unremarkable, but incidentally noted edema around the gallbladder in the absence of gallstones.  Pt subsequently had bloodwork noting an elevated alk phos in October (rest of his liver panel was normal).  The elevated alk phos prompted additional liver/gallbladder work-up including hepatitis panel 06/22/16 which was negative and abd [place] on same date which noted thickened gb wall to 75m, no gallstones, negative Murphy's sign and CBD at upper limits of normal 619m    Patient denies post-prandial onset of his LLQ pain.  The LLQ pain episodes occur 3-4 times per week.  The episodes can last up to 3 days at their worst.  The pain is usually quite sharp in nature. He denies any radiation of the pain elsewhere.  He has never had RUQ or epigastric pain.  He denies frequent indigestion or early satiety.  His appetite is very good.  He does not avoid fatty foods.  He has limited his spice intake (because of gastritis noted on EGD) and he has cut out foods that are known to cause gas (e.g. broccoli, beans, etc).  He has not had any weight loss  recently.  He denies prior jaundice, pancreatitis, cholecystitis or changes in the color of his urine or stools.  No melena or hematochezia.       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        Serrated adenomatous colon polyps; due 2018/19        Hiatal hernia        Diverticulosis colon        Elevated BMI            Past Surgical History:  Reviewed history from 06/04/2016 and no changes required:        Right middle lobe resection October 2015        Bowel Resection 1994 ("stomach had attached itself to the small bowel")        Cardiac Catherization        PTCA        Diagnostic Bronchoscopy        EGD - 2013 - gastric ulcers; 2017 gastritis, adenomtous duodenal polyps, no bleeding        Colonoscopy - 2013 - recommended  5 yr follow up; repeat 12/2015 - villous adenoma sigmoid, tubular adenoma of transverse colon, ileum and ascending colon - CD3 immunostain neg.     Family History Summary:      Reviewed history Last on 06/15/2016 and no changes required:08/02/2016  Mother Ailene Ravel.) - Has Family History Unknown - Entered On: 06/15/2016    General Comments - FH:  Pt was adopted-no family history    Social History:     Reviewed history from 01/08/2016 and no changes required:                Smoking History:        Patient is a former smoker. Quit in Oct 2015        Marital Status: Widowed.  Lives with girlfriend        Children: no        Occupation: was in [employer].       Risk Factors:     Smoked Tobacco Use:  Former smoker     Cigarettes:  Yes -- 2 pack(s) per day,    Pack-years:  100        Year started:  2        Year quit:  2015        Years Since Last Quit:  2   Alcohol use:  no  Exercise:  no      Review of Systems     General: no fatigue  H&N : no hearing or vision problems  Skin: no new skin lesions  CVS: no chest pain  Resp: no shortness of breath  Abd: see HPI  GU: no new urinary sx's  MSK: no weakness  Neuro: no focal neurologic complaints  Psych: no mood changes       Vital Signs:    Patient Profile:    70 Years Old Male  Height:     68.5 inches  Weight:     195 pounds  BMI:        29.22     BSA:        2.03  Pulse rate: 81 / minute  Pulse rhythm:   regular  Resp:       14 per minute  BP Sitting: 124 / 70  (left arm)    Cuff size:  regular  Patient has a risk of falls? Assessment not done     Reason: Medical contraindication  Patient in pain?    No  Medications: Medications were reviewed with the patient during this visit.  Allergies: Allergies were reviewed with the patient during this visit.  No Known Allergy.  No Known Drug Allergy.        Vitals Entered By: Rada Hay CMA (August 02, 2016 1:08 PM)      Physical Exam    General:      well developed, well nourished, appears well and in no acute distress.    Head:  normocephalic and atraumatic, no sinus tenderness.    Eyes:      PERRL/EOM intact, conjunctiva and sclera clear without nystagmus.    Lungs:      clear to auscultation with good air entry bilaterally, no wheezes, rales or rhonchi.    Heart:      regular rate and rhythm, normal S1 and S2 without murmurs, rubs, or gallops  Abdomen:      soft, non-tender, non-distended with normal bowel sounds; abdomen is protuberant.  There is a well-healed midline incision present.  No ventral or umbilical hernias, no masses noted.  No tenderness or palpable masses with deep palpation in the right upper abdomen and epigastrium.  Msk:       no spinal deformity or scoliosis noted, full range of motion of all joints with no swelling or deformity.    Extremities:      no clubbing, cyanosis or edema.    Neurologic:      no focal deficits.  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  However, the pt has no symptoms to suggest chronic cholecystitis and no tenderness in the RUQ on exam.  As such, I think that his gallbladder findings are incidental and may represent an anatomical variant.  They do not warrant surgical intervention at this time.  He and I discussed the sx commonly associated with gb disease and he will return to my care should he develop these.   Orders:  OV New - Level IV ([id number])    Your BMI is above normal parameters.  - Decrease caloric intake.  - Increase exercise.  - Consider additional dietary counseling.  - Get 7-8 hrs of sleep a night  - Follow up with your Primary Care Team.      Complete Medication List:  1)  Allopurinol 300 Mg Oral Tablet (Allopurinol) .... Take one by mouth once a day.  2)  Albuterol Sulfate (2.5 Mg/49m) 0.083% Inhalation Nebulizatio (Albuterol sulfate) .... Take 3 mls by nebulization every 4 hours as needed for wheezing  3)  Ultram 50 Mg Oral Tablet (Tramadol hcl) .... Take 1 tablet by mouth every 6 hours as needed  4)  Trazodone Hcl 100 Mg Oral Tablet (Trazodone hcl) .... Take one before bedtime.  5)  Flomax 0.4 Mg Oral Capsule (Tamsulosin hcl) .... Take one tablet by mouth daily.  6)  Glucophage 850 Mg Oral Tablet (Metformin hcl) .... Take one tablet by mouth daily.  7)  Symbicort 160-4.5 Mcg/act Inhalation Aerosol (Budesonide-formoterol fumarate) .... Take as directed   8)  Bentyl 20 Mg Oral Tablet (Dicyclomine hcl) .... Take 1 tablet by mouth every 6  hours as needed  9)  Cozaar 25 Mg Oral Tablet (Losartan potassium) .... Take one tablet by mouth daily.  10)  Eliquis 5 Mg Oral Tablet (Apixaban) .... Take one tablet by mouth twice daily.  11)  Atorvastatin Calcium 40 Mg Oral Tablet (Atorvastatin calcium) .... Take one tablet by mouth daily.  12)  Aspirin Ec Low Dose 81 Mg Oral Tablet Delayed Release (Aspirin) .... Take one tablet by mouth daily.  13)  Calcium Carbonate-vitamin D 500-400 Mg-unit Oral Tablet (Calcium  carbonate-vitamin d) .... Take one tablet by mouth daily.  14)  Tudorza Pressair 400 Industrial/product designer (Aclidinium bromide) .... Take 1 puff twice a day  15)  Protonix 40 Mg Oral Tablet Delayed Release (Pantoprazole sodium) .... Take one tablet by mouth daily. first thing in the a.m empty stomach at least 30 min before breakfast  16)  Miralax Oral Powder (Polyethylene glycol 3350) .... Take 17gms by disolved in water daily as needed for constipation  17)  Ferrous Sulfate 325 (65 Fe) Mg Oral Tablet (Ferrous sulfate) .... Once a day          Patient Instructions:  1)  Your gallbladder wall appeared thickened on the CT you had in May and on the abdominal ultrasound from October.  2)  This finding is usually seen with inflammation of the gallbladder; however, you have no symptoms to suggest chronic cholecystitis and no tenderness in the right upper abdomen on your physical exam.   3)  As such, I think that the gallbladder findings are incidental and may represent a normal finding for you.  4)  At this time, you do not need an operation to remove your gallbladder.  5)  You are welcome to return to my care as needed.        Additional Clinical Support Documentation      The medication list contains greater than 5 medications and was reviewed with the patient today. Rada Hay CMA  August 02, 2016 1:09 PM     I have either personally documented or reviewed the patients past medical, surgical, family and social history and review of systems if present in the note and when recorded by clinical staff.       cc: Biagio Quint, ?PCP at [facility]        Electronically Signed by Bonnita Nasuti MD on 08/02/2016 at 9:06 PM  ________________________________________________________________________

## 2016-09-25 LAB — IRON & IRON BINDING
% SATURATION: 26 %
IRON: 72 UG/DL (ref 65–175)
TIBC: 279 UG/DL (ref 250–450)

## 2016-09-25 LAB — CBC W/O DIFF
HCT: 42.4 % (ref 40.1–51.0)
HGB: 14.4 GM/DL (ref 13.7–17.5)
MCH: 30.9 PG (ref 25.6–32.2)
MCHC: 34 G/DL (ref 32.2–35.5)
MCV: 91 FL (ref 80–95)
MPV: 9.8 FL (ref 9.4–12.4)
NRBC: 0 /100 WBC (ref 0–0.02)
PLATELET: 151 10*3/uL (ref 150–400)
RBC: 4.66 M/ul (ref 4.51–5.93)
RDW: 14.6 % — ABNORMAL HIGH (ref 11.6–14.4)
WBC: 6.5 10*3/uL (ref 4.0–10.0)

## 2016-09-25 LAB — CREATININE AND GFR
Creatinine: 1.2 mg/dL (ref 0.7–1.3)
Estimated GFR: 60 mL/min/{1.73_m2} — ABNORMAL LOW (ref >60)

## 2016-09-25 LAB — FERRITIN: FERRITIN: 54 NG/ML (ref 26–388)

## 2016-09-25 LAB — AMB EXT HCT: Hct, External: 42.4

## 2016-09-25 LAB — BUN: BUN: 19 MG/DL — ABNORMAL HIGH (ref 7–18)

## 2016-09-28 LAB — ALK PHOS ISOENZYMES
ALK. PHOS.: 119 — ABNORMAL HIGH
BONE %: 30.7
BONE: 36.5
INTESTINE %: 0.9
INTESTINE: 1.1
LIVER 1 %: 63.9
LIVER 1: 76 — ABNORMAL HIGH
LIVER 2 %: 4.5
LIVER 2: 5.4
PLACENTAL: 200

## 2016-12-15 LAB — CBC WITH AUTOMATED DIFF
ABS. BASOPHILS: 0 10*3/uL (ref 0.00–0.10)
ABS. EOSINOPHILS: 0.2 10*3/uL (ref 0.00–0.50)
ABS. IMM. GRANS.: 0.02 10*3/uL (ref 0–0.03)
ABS. LYMPHOCYTES: 1 10*3/uL — ABNORMAL LOW (ref 1.20–3.70)
ABS. MONOCYTES: 0.6 10*3/uL (ref 0.20–0.80)
ABS. NEUTROPHILS: 5 10*3/uL (ref 1.56–6.13)
BASOPHILS: 0.1 %
EOSINOPHILS: 3.1 %
HCT: 45.8 % (ref 40.1–51.0)
HGB: 15.7 GM/DL (ref 13.7–17.5)
IMMATURE GRANULOCYTES: 0.3 % (ref 0–0.43)
LYMPHOCYTES: 14.6 %
MCH: 32 PG (ref 25.6–32.2)
MCHC: 34.3 G/DL (ref 32.2–35.5)
MCV: 93.5 FL (ref 80–95)
MONOCYTES: 8.3 %
MPV: 10.7 FL (ref 9.4–12.4)
NEUTROPHILS: 73.6 %
NRBC: 0 /100 WBC (ref 0–0.02)
PLATELET: 142 10*3/uL — ABNORMAL LOW (ref 150–400)
RBC: 4.9 M/ul (ref 4.51–5.93)
RDW: 14.2 % (ref 11.6–14.4)
WBC: 6.8 10*3/uL (ref 4.0–10.0)

## 2016-12-15 LAB — METABOLIC PANEL, COMPREHENSIVE
ALT (SGPT): 36 IU/L (ref 16–61)
AST (SGOT): 24 IU/L (ref 15–37)
Albumin: 3.8 G/DL (ref 3.4–5.0)
Alk. phosphatase: 135 U/L — ABNORMAL HIGH (ref 45–117)
Anion gap: 10.6 MMOL/L (ref 5–15)
BUN: 22 MG/DL — ABNORMAL HIGH (ref 7–18)
Bilirubin, total: 0.4 MG/DL (ref 0.2–1.2)
CO2: 27.4 MMOL/L (ref 21–32)
Calcium: 9.3 MG/DL (ref 8.5–10.1)
Chloride: 103 MMOL/L (ref 98–110)
Creatinine: 1.4 mg/dL — ABNORMAL HIGH (ref 0.7–1.3)
Estimated GFR: 50 mL/min/{1.73_m2} — ABNORMAL LOW (ref >60)
FASTING GLUCOSE: 139 MG/DL — ABNORMAL HIGH (ref 70–100)
Potassium: 4.3 MMOL/L (ref 3.5–5.1)
Protein, total: 7 G/DL (ref 6.4–8.2)
Sodium: 141 MMOL/L (ref 136–145)

## 2016-12-15 LAB — LIPID PANEL
Cholesterol, total: 122 MG/DL (ref 0–200)
HDL Cholesterol: 32 MG/DL — ABNORMAL LOW (ref >60)
HDL Cholesterol: HIGH
LDL, calculated: 47 MG/DL (ref <100)
LDL, calculated: BORDERLINE
LDL, calculated: HIGH
Triglyceride: 216 MG/DL — ABNORMAL HIGH (ref 0–150)

## 2016-12-15 LAB — FERRITIN: FERRITIN: 25.2 NG/ML — ABNORMAL LOW (ref 26–388)

## 2016-12-15 LAB — IRON & IRON BINDING
% SATURATION: 19 %
IRON: 60 UG/DL — ABNORMAL LOW (ref 65–175)
TIBC: 311 UG/DL (ref 250–450)

## 2016-12-15 LAB — VITAMIN B12: VITAMIN B12: 690 PG/ML (ref 211–911)

## 2016-12-16 ENCOUNTER — Encounter

## 2016-12-17 LAB — METHYLMALONIC ACID
Methylmalonic acid: 0.39
Methylmalonic acid: 200

## 2016-12-20 LAB — FAX TO

## 2017-02-03 ENCOUNTER — Ambulatory Visit: Attending: Internal Medicine | Primary: Family Medicine

## 2017-03-31 NOTE — Telephone Encounter (Signed)
Patient would like to speak to a nurse, patient is having a lot of pain and gas/cramping in his stomach

## 2017-03-31 NOTE — Telephone Encounter (Signed)
Pt reports abdominal cramping and gas last week while on vacation. Pt reports taking Tramadol from pcp for this pain. Pt states he is moving his bowels fine and the cramping/pain has subsided. Per Dr. Berton Lan pt needs a f/u appt. We will place pt on a cx list for a sooner appt. Pt advised of this.

## 2017-05-19 ENCOUNTER — Telehealth

## 2017-05-19 MED ORDER — PEG-ELECTROLYTE SOLUTION 420 G ORAL (RECON)
420 gram | Freq: Once | ORAL | 0 refills | Status: AC
Start: 2017-05-19 — End: 2017-05-19

## 2017-05-19 NOTE — Telephone Encounter (Signed)
Discussed symptoms with patient. Having periumbilical abdominal pain which is not severe or waking upon sound sleep.  Feels full all the time and feels bloated.  Reports having liquid stools.  Reviewed prior records and there is a history of chronic constipation. His last colonoscopy in 2017 was limited by the prep.  There was a plan to repeat his colonoscopy in 2018 or 2019.  Also has a history of an abnormal appearing gallbladder but symptoms are not consistent with colic after discussion with patient. He was seen by Dr. Barry Dienes.  I discussed options to schedule imaging versus performing a colonoscopy and EGD.  I advised the patient to proceed to the emergency room with the pain worsens or is associated with nausea or vomiting. He denies severe pain and elects to hold off on emergency room visit.  He is scheduled for lab work next week but he is not aware as to what labs are being done.  He is scheduled to see me the other 20 of September.  I will attempt to get an EGD and colonoscopy before then.  Based on the results, we will consider the need for stool for C diff.  He denies any recent antibiotic use.

## 2017-05-19 NOTE — Telephone Encounter (Signed)
Patient having cramping issues in his stomach. He also wants to know if with this issue, if Dr Berton Lan wants him to have extra labs done. He is requesting a call from nurse.

## 2017-05-19 NOTE — Telephone Encounter (Signed)
Colonoscopy/EGD Dr.Gulur May 30, 2017    Patient is scheduled on: 05/30/17     Check In: 12:05 P.M.  Procedure: Colonoscopy/EGD  DX: Periumbilical abd pain, chronic constipation, Hx colon polyps  Preferred Pharmacy: [facility] [place]  Patient Aware, notification sent to PCP   Patient instructed to have ride due to Propofol anesthesia   Blood Thinners: Eliquis- Pt was instructed to check with doctor to see if any adjustments needed to be made prior.     PLEASE MAIL PREP INSTRUCTIONS TO PT  AND SEND PRESCRIPTION TO THE PHARMACY.     PATIENT WOULD LIKE INSTRUCTIONS MAILED TO [place] [address]

## 2017-05-19 NOTE — Telephone Encounter (Signed)
Nulytely prep mailed to pt home address on file. RX sent to pt pharmacy on file.

## 2017-05-30 ENCOUNTER — Inpatient Hospital Stay: Payer: PRIVATE HEALTH INSURANCE

## 2017-05-30 MED ORDER — PROPOFOL 10 MG/ML IV EMUL
10 mg/mL | INTRAVENOUS | Status: DC | PRN
Start: 2017-05-30 — End: 2017-05-30
  Administered 2017-05-30: 18:00:00 via INTRAVENOUS

## 2017-05-30 MED ORDER — PROPOFOL 10 MG/ML IV EMUL
10 mg/mL | INTRAVENOUS | Status: AC
Start: 2017-05-30 — End: ?

## 2017-05-30 MED ORDER — PROPOFOL 10 MG/ML IV EMUL
10 mg/mL | INTRAVENOUS | Status: DC | PRN
Start: 2017-05-30 — End: 2017-05-30
  Administered 2017-05-30 (×3): via INTRAVENOUS

## 2017-05-30 MED ORDER — LIDOCAINE HCL 2 % (20 MG/ML) IJ SOLN
20 mg/mL (2 %) | INTRAMUSCULAR | Status: DC | PRN
Start: 2017-05-30 — End: 2017-05-30
  Administered 2017-05-30: 18:00:00 via INTRAVENOUS

## 2017-05-30 MED ORDER — SODIUM CHLORIDE 0.9 % IV
INTRAVENOUS | Status: DC
Start: 2017-05-30 — End: 2017-05-30
  Administered 2017-05-30 (×2): via INTRAVENOUS

## 2017-05-30 MED FILL — SODIUM CHLORIDE 0.9 % IV: INTRAVENOUS | Qty: 1000

## 2017-05-30 MED FILL — XYLOCAINE 20 MG/ML (2 %) INJECTION SOLUTION: 20 mg/mL (2 %) | INTRAMUSCULAR | Qty: 100

## 2017-05-30 MED FILL — DIPRIVAN 10 MG/ML INTRAVENOUS EMULSION: 10 mg/mL | INTRAVENOUS | Qty: 543.98

## 2017-05-30 MED FILL — DIPRIVAN 10 MG/ML INTRAVENOUS EMULSION: 10 mg/mL | INTRAVENOUS | Qty: 60

## 2017-05-30 MED FILL — SODIUM CHLORIDE 0.9 % IV: INTRAVENOUS | Qty: 100

## 2017-05-30 NOTE — Procedures (Signed)
This document was generated with the aid of voice recognition software.. Please be aware that there may be inadvertent transcription errors not identified and corrected by the author.        EGD AND COLONOSCOPY REPORT    2:34 PM  Referring:  Paul Joe, MD   Date: 05/30/2017  Endoscopist: Duard Larsen, MD  Procedures:   1. EGD with biopsy and snare polypectomy  2. Colonoscopy with biopsy and snare polypectomy  Indication:  1. EGD:  Epigastric pain.  History of adenomatous duodenal polyp.  2. Colonoscopy:  High risk screening.  Last colonoscopy suboptimal due to prep.  Also reports diarrhea.  History of colon polyps.  Impression:  1. EGD:  2 polyps removed in the duodenum.  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  Nodular gastritis.  2. Colonoscopy:  Diminutive polyp removed.  Pandiverticulosis.  History of sigmoid resection with normal-appearing anastomosis.  External hemorrhoids.  Suboptimal preparation.  Recommendations:  1.  Assess response to prep.  Follow up biopsies taken to look for microscopic colitis.  2.  Interval repeat colonoscopy to be based on pathology of polyps, history of polyps and keeping in mind suboptimal preparation.  Likely 3 years.  3.  Follow up pathology of duodenal polyps.  Interval repeat endoscopy based on results.  Can resume Eliquis and aspirin tomorrow.  4.  Advised to follow up with Dr. Barry Dienes to discuss abdominal pain in the setting of gallstones.    Patient is clear discharge after meeting post anesthesia guidelines.    Clinical History and Physical examination: The patients clinical history was obtained and physical exam performed and documented in the medical record. No changes immediately prior to the procedure    Consent: The indication, benefits, risks and alternatives were discussed with the patient. I explained to the patient that the risks of the procedure included but were not limited to infection, bleeding, pneumonia, need for  intubation, the risk of injury to the colon, the risk of injuring abdominal organs such as the liver and spleen, which may require admission to the hospital, medications, transfusion, procedures such as a repeat colonoscopy or surgery, as well as the risk of missed lesions such as polyps or cancer.   For moderate sedation cases: I then explained to the patient the risks related to sedation that included but were not limited to allergic reactions, abnormal heart beats, breathing difficulty, blood pressure changes or the need for life support interventions. The patient then signed the consent form to document informed consent.  For deep sedation cases: consent obtained by anesthesia provider    Preparation:   The procedure was performed in endoscopy suite. Continuous monitoring of heart rate, heart rhythm, respiratory rate and oxygen saturation was performed throughout the procedure. Blood pressure checks were performed at 5 minute intervals.  ASA:  Refer to chart  AIRWAY: Refer to chart    Medications:   As per anaesthesia for deep sedation case.   For moderate sedation cases: Versed:  Fentanyl: Benadryl: Phenergan:    Procedure:   The patient was taken to the endoscopy suite and placed in the left lateral decubitus position. Time out was performed.   1. EGD: The endoscope was advanced under direct visualization to the descending duodenum. The endoscope was withdrawn in a controlled fashion for careful examination. Retroflexion was performed.  Findings:  1. Hypopharynx:normal.  2. Esophagus:normal.  3. Gastroesophageal junction:Was at 40 cm.  Normal.  4. Stomach:Chronic diffuse nodular gastritis with 2 prominent mucosal nodules  in the antrum on the lesser curvature.  Biopsies taken.  No ulcers or masses in the forward view upon retroflexion.  5. Pylorus:normal.  6. Duodenum:  Multiple mucosal nodule in the duodenal bulb likely Brunner's gland hyperplasia.  Biopsies taken.  Adenomatous appearing polyp on the  anterosuperior aspect of the duodenal bulb removed by cold snare.  One clip placed the site to prevent bleeding.  Additional polyp noted in the 2nd portion of the duodenum with a narrow base removed by cold snare.  Post polypectomy appearance of the site satisfactory.    Specimens:  1.  Duodenal polyps x2 2.  Duodenal nodule biopsies 3.  Antral nodule biopsies  Blood loss: Negligible.  Complications: No immediate complications from the upper endoscopy.    Procedure: 2. Colonoscopy: The patient was turned around. A digital rectal exam was performed and no masses were palpable. The endoscope was introduced into the rectum and advanced under direct visualization to the cecum as confirmed by the appendiceal orifice and ileocecal valve. Photo Documentation was done.The endoscope was withdrawn in a controlled fashion for careful examination of the colon. Retroflexion was performed. The preparation was adequate  Findings:  For redundant spastic colon.  History of sigmoid resection with normal-appearing anastomosis.  Pandiverticulosis.  3 m sessile polyp in the rectum removed by cold snare.  External hemorrhoids.  Preparation made adequate to fine polyps greater than 6 mm.  Spastic colon.  Terminal ileum visualized to 2 cm and the mucosa appeared normal.    Time in:  4 min  Withdrawal time:  14 min  Specimens:  1.  Random colon biopsy 2.  Rectal polyp  Blood loss: Negligible.  Complications: No immediate complications. The patient was transferred to the recovery area

## 2017-05-30 NOTE — Anesthesia Pre-Procedure Evaluation (Addendum)
Anesthetic History   No history of anesthetic complications            Review of Systems / Medical History  Patient summary reviewed, nursing notes reviewed and pertinent labs reviewed    Pulmonary    COPD            Comments: CANCER OF RIGHT LUNG   Neuro/Psych              Cardiovascular            Dysrhythmias : atrial fibrillation  CAD and hyperlipidemia      Comments: H/O PTCA   GI/Hepatic/Renal           Hiatal hernia and liver disease     Endo/Other    Diabetes: well controlled, type 1    Obesity  Pertinent negatives: No morbid obesity   Other Findings   Comments: IBS,         Physical Exam    Airway  Mallampati: II  TM Distance: 4 - 6 cm  Neck ROM: normal range of motion   Mouth opening: Normal     Cardiovascular               Dental    Dentition: Edentulous     Pulmonary                 Abdominal  GI exam deferred       Other Findings            Anesthetic Plan    ASA: 3  Anesthesia type: total IV anesthesia          Induction: Intravenous  Anesthetic plan and risks discussed with: Patient

## 2017-05-30 NOTE — Anesthesia Post-Procedure Evaluation (Signed)
Post-Anesthesia Evaluation and Assessment    Patient: Paul [name] MRN: [id number]  SSN: [id number]    Date of Birth: 03/23/1946  Age: 72 y.o.  Sex: male       Cardiovascular Function/Vital Signs  Visit Vitals   ??? BP 160/80   ??? Pulse 76   ??? Temp 36 ??C (96.8 ??F)   ??? Resp 18   ??? Ht 5\' 9"  (1.753 m)   ??? Wt 90.7 kg (200 lb)   ??? SpO2 98%   ??? BMI 29.53 kg/m2       Patient is status post total IV anesthesia anesthesia for Procedure(s):  ESOPHAGOGASTRODUODENOSCOPY (EGD)  COLONOSCOPY.    Nausea/Vomiting: None    Postoperative hydration reviewed and adequate.    Pain:  Pain Scale 1: Numeric (0 - 10) (05/30/17 1445)  Pain Intensity 1: 0 (05/30/17 1445)   Managed    Neurological Status:       At baseline    Mental Status and Level of Consciousness: Alert and oriented     Pulmonary Status:   O2 Device: Room air (05/30/17 1445)   Adequate oxygenation and airway patent    Complications related to anesthesia: None    Post-anesthesia assessment completed. No concerns    Signed By: Martyn Ehrich, MD     May 30, 2017

## 2017-05-30 NOTE — H&P (Signed)
This document was generated with the aid of voice recognition software. Please be aware that there may be inadvertent transcription errors not identified and corrected by the author.      PRE PROCEDURE H&P    Referring: Tally Joe, MD     12:56 PM  05/30/17    INDICATION:  Patient is a 71 y.o. male who has been referred for an EGD for abd pain, hx of duodenal adenoma and colonoscopy for abd pain, hx of polyps      PMH:  Active Ambulatory Problems     Diagnosis Date Noted   ??? Abdominal pain, generalized 12/16/2016   ??? Gallbladder disorder 12/16/2016   ??? Abnormal abdominal MRI 12/16/2016   ??? Elevated alkaline phosphatase level 12/16/2016   ??? Non-alcoholic fatty liver disease 12/16/2016   ??? Fecal incontinence 12/16/2016   ??? History of colon polyps 12/16/2016   ??? Overweight (BMI 25.0-29.9) 12/16/2016   ??? Arteriovenous malformation, other site 12/16/2016   ??? Polyp of duodenum 12/16/2016   ??? GI bleeding 12/16/2016   ??? Adequate anticoagulation on anticoagulant therapy 12/16/2016   ??? Body mass index (bmi) 29.0-29.9, adult 12/16/2016   ??? Hyperlipidemia 12/16/2016   ??? Esophageal reflux 12/16/2016   ??? COPD (chronic obstructive pulmonary disease) ([place]) 12/16/2016   ??? Diabetes mellitus, type 2 ([facility]) 12/16/2016   ??? Non-small cell carcinoma of lung, stage 1 ([place]) 12/16/2016   ??? Iron deficiency anemia 12/16/2016   ??? Atrial flutter ([place]) 12/16/2016   ??? Coronary atherosclerosis due to calcified coronary lesion ([facility]) 12/16/2016     Resolved Ambulatory Problems     Diagnosis Date Noted   ??? No Resolved Ambulatory Problems     Past Medical History:   Diagnosis Date   ??? A-fib ([place])    ??? Adenomatous colon polyp    ??? Adenomatous duodenal polyp    ??? AVM (arteriovenous malformation)    ??? CAD (coronary artery disease)    ??? Cancer of right lung ([place])    ??? COPD (chronic obstructive pulmonary disease) ([place])    ??? Diabetes ([place])    ??? Diverticulosis of colon    ??? Dyslipidemia    ??? Hiatal hernia    ??? History of blood transfusion 2017    ??? IBS (irritable bowel syndrome)    ??? Iron deficiency          PSH:  Past Surgical History:   Procedure Laterality Date   ??? HX COLONOSCOPY  2013    RECOMMENDED 5 YR FOLLOWUP; REPEAT 4/17-VILLOUS ADENOMA SIGMOID, TUBULAR ADENOMA OF TRANSVERSE COLON, ILEUM AND ASCENDING COLON-CD3 IMMUNOSTAIN NEG   ??? HX ENDOSCOPY      2013-GASTRIC ULCERS; 2017 GASTRITIS; ADENOMTOUS DUODENAL POLYPS, NO BLEEDING   ??? HX HEART CATHETERIZATION  2003    Stent x 1   ??? HX OTHER ARTIFICIAL OPENING      DIAGNOSTIC BRONCHOSCOPY   ??? HX OTHER SURGICAL Right 06/2014    MIDDLE LOBE RESECTION   ??? HX PTCA     ??? HX SMALL BOWEL RESECTION  1994    "STOMACH HAD ATTACHED ITSELF TO SMALL BOWEL"       Allergies:  No Known Allergies    Home Medications:  Prescriptions Prior to Admission   Medication Sig   ??? omeprazole (PRILOSEC) 40 mg capsule Take 40 mg by mouth two (2) times a day.   ??? allopurinol (ZYLOPRIM) 300 mg tablet Take 300 mg by mouth daily.   ??? ALBUTEROL SULFATE PO Take 3 Mls  by nebulization every 4 hours as needed for wheezing   Indications: Albuterol Sulfate (2.5 MG/3ML) 0.083% Inhalation Nebulizatio (Albuterol Sulfate)   ??? traZODone (DESYREL) 100 mg tablet Take 100 mg by mouth nightly.   ??? tamsulosin (FLOMAX) 0.4 mg capsule Take 0.4 mg by mouth daily.   ??? metFORMIN (GLUCOPHAGE) 850 mg tablet Take 500 mg by mouth daily. Indications: daily or twice a day   ??? budesonide-formoterol (SYMBICORT) 160-4.5 mcg/actuation HFAA Take as directed   ??? dicyclomine (BENTYL) 20 mg tablet Take 20 mg by mouth every six (6) hours. As needed   ??? losartan (COZAAR) 25 mg tablet Take 25 mg by mouth daily.   ??? atorvastatin (LIPITOR) 40 mg tablet Take 40 mg by mouth daily.   ??? aspirin delayed-release 81 mg tablet Take 81 mg by mouth daily.   ??? Calcium-Cholecalciferol, D3, 500 mg(1,250mg ) -400 unit tab Take 1 Tab by mouth daily.   ??? aclidinium bromide (TUDORZA PRESSAIR) 400 mcg/actuation inhaler Take 1 Puff by inhalation two (2) times a day.    ??? polyethylene glycol (MIRALAX) 17 gram/dose powder take 17gms by disolved in water daily as needed for constipation   ??? traMADol (ULTRAM) 50 mg tablet Take 50 mg by mouth every six (6) hours as needed.   ??? apixaban (ELIQUIS) 5 mg tablet Take 5 mg by mouth two (2) times a day.   ??? ferrous sulfate 325 mg (65 mg iron) tablet Take  by mouth Daily (before breakfast).         Social History:  Social History     Social History   ??? Marital status: SINGLE     Spouse name: N/A   ??? Number of children: N/A   ??? 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 Smoking status: Former Smoker     Quit date: 06/2014   ??? Smokeless tobacco: Never Used   ??? Alcohol use No   ??? Drug use: Not on file   ??? Sexual activity: Not on file     Other Topics Concern   ??? Not on file     Social History Narrative    PATIENT IS A FORMER SMOKER. QUIT IN OCT 2015    MARTIAL STATUS: WIDOWED. LIVES WITH GIRLFRIEND    CHILDREN: NO    OCCUPATION: WAS IN SALES       Family History:  Family History   Problem Relation Age of Onset   ??? Adopted: Yes   ??? Family history unknown: Yes       Review of Systems: (negative unless otherwise indicated)        EXAM:   General:Alert and Oriented x3, not in distress, able to speak in complete sentences  HEENT:Normocephalic, atraumatic, EOMI, PERRLA, anicteric sclerae  Oral: no ulcers, exudates, vascular malformation or masses  Neck:supple, no lymphadenopathy, JVD or neck vein engorgement  Lungs:Clear bilaterally, no wheezes or crackles. No chest wall deformity  Heart:RRR, no murmurs appreciated  Abdomen:soft, BS+, non distended. Non-tender.No rebound, rigidity or guarding. No masses or organomegaly  Extremities: no Pedal edema, cyanosis, clubbing. Peripheral pulses palpable  Rectal: deferred     Assessment:   ?? Appropriate candidate for procedure, no contra indcation     Plan:   ?? Proceed with EGD and colonoscopy     Valetta Fuller, MD

## 2017-05-30 NOTE — Procedures (Signed)
This document was generated with the aid of voice recognition software.. Please be aware that there may be inadvertent transcription errors not identified and corrected by the author.        EGD AND COLONOSCOPY REPORT    2:34 PM  Referring:  Tally Joe, MD   Date: 05/30/2017  Endoscopist: Duard Larsen, MD  Procedures:   1. EGD with biopsy and snare polypectomy  2. Colonoscopy with biopsy and snare polypectomy  Indication:  1. EGD:  Epigastric pain.  History of adenomatous duodenal polyp.  2. Colonoscopy:  High risk screening.  Last colonoscopy suboptimal due to prep.  Also reports diarrhea.  History of colon polyps.  Impression:  1. EGD:  2 polyps removed in the duodenum.  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  Nodular gastritis.  2. Colonoscopy:  Diminutive polyp removed.  Pandiverticulosis.  History of sigmoid resection with normal-appearing anastomosis.  External hemorrhoids.  Suboptimal preparation.  Recommendations:  1.  Assess response to prep.  Follow up biopsies taken to look for microscopic colitis.  2.  Interval repeat colonoscopy to be based on pathology of polyps, history of polyps and keeping in mind suboptimal preparation.  Likely 3 years.  3.  Follow up pathology of duodenal polyps.  Interval repeat endoscopy based on results.  Can resume Eliquis and aspirin tomorrow.  4.  Advised to follow up with Dr. Barry Dienes to discuss abdominal pain in the setting of gallstones.    Patient is clear discharge after meeting post anesthesia guidelines.    Clinical History and Physical examination: The patients clinical history was obtained and physical exam performed and documented in the medical record. No changes immediately prior to the procedure    Consent: The indication, benefits, risks and alternatives were discussed with the patient. I explained to the patient that the risks of the procedure included but were not limited to infection, bleeding, pneumonia,  need for intubation, the risk of injury to the colon, the risk of injuring abdominal organs such as the liver and spleen, which may require admission to the hospital, medications, transfusion, procedures such as a repeat colonoscopy or surgery, as well as the risk of missed lesions such as polyps or cancer.   For moderate sedation cases: I then explained to the patient the risks related to sedation that included but were not limited to allergic reactions, abnormal heart beats, breathing difficulty, blood pressure changes or the need for life support interventions. The patient then signed the consent form to document informed consent.  For deep sedation cases: consent obtained by anesthesia provider    Preparation:   The procedure was performed in endoscopy suite. Continuous monitoring of heart rate, heart rhythm, respiratory rate and oxygen saturation was performed throughout the procedure. Blood pressure checks were performed at 5 minute intervals.  ASA:  Refer to chart  AIRWAY: Refer to chart    Medications:   As per anaesthesia for deep sedation case.   For moderate sedation cases: Versed:  Fentanyl: Benadryl: Phenergan:    Procedure:   The patient was taken to the endoscopy suite and placed in the left lateral decubitus position. Time out was performed.   1. EGD: The endoscope was advanced under direct visualization to the descending duodenum. The endoscope was withdrawn in a controlled fashion for careful examination. Retroflexion was performed.  Findings:  1. Hypopharynx:normal.  2. Esophagus:normal.  3. Gastroesophageal junction:Was at 40 cm.  Normal.  4. Stomach:Chronic diffuse nodular gastritis with 2 prominent mucosal nodules  in the antrum on the lesser curvature.  Biopsies taken.  No ulcers or masses in the forward view upon retroflexion.  5. Pylorus:normal.  6. Duodenum:  Multiple mucosal nodule in the duodenal bulb likely Brunner's gland hyperplasia.  Biopsies taken.  Adenomatous appearing polyp  on the anterosuperior aspect of the duodenal bulb removed by cold snare.  One clip placed the site to prevent bleeding.  Additional polyp noted in the 2nd portion of the duodenum with a narrow base removed by cold snare.  Post polypectomy appearance of the site satisfactory.    Specimens:  1.  Duodenal polyps x2 2.  Duodenal nodule biopsies 3.  Antral nodule biopsies  Blood loss: Negligible.  Complications: No immediate complications from the upper endoscopy.    Procedure: 2. Colonoscopy: The patient was turned around. A digital rectal exam was performed and no masses were palpable. The endoscope was introduced into the rectum and advanced under direct visualization to the cecum as confirmed by the appendiceal orifice and ileocecal valve. Photo Documentation was done.The endoscope was withdrawn in a controlled fashion for careful examination of the colon. Retroflexion was performed. The preparation was adequate  Findings:  For redundant spastic colon.  History of sigmoid resection with normal-appearing anastomosis.  Pandiverticulosis.  3 m sessile polyp in the rectum removed by cold snare.  External hemorrhoids.  Preparation made adequate to fine polyps greater than 6 mm.  Spastic colon.  Terminal ileum visualized to 2 cm and the mucosa appeared normal.    Time in:  4 min  Withdrawal time:  14 min  Specimens:  1.  Random colon biopsy 2.  Rectal polyp  Blood loss: Negligible.  Complications: No immediate complications. The patient was transferred to the recovery area

## 2017-05-31 NOTE — Telephone Encounter (Signed)
Advised to resume DOAC.   Advised to monitor for overt GI bleeding.  He will call me if he has any issues and/or proceed to the emergency room depending on the hour amount.

## 2017-05-31 NOTE — Telephone Encounter (Signed)
Patient was told to call and update if had moved his bowels if any blood was present, patient states he has not had a bowel movement as of yet today.

## 2017-06-02 ENCOUNTER — Ambulatory Visit
Admit: 2017-06-02 | Discharge: 2017-06-02 | Payer: PRIVATE HEALTH INSURANCE | Attending: Internal Medicine | Primary: Family Medicine

## 2017-06-02 DIAGNOSIS — D500 Iron deficiency anemia secondary to blood loss (chronic): Secondary | ICD-10-CM

## 2017-06-02 NOTE — Progress Notes (Signed)
06/02/17 Follow up: Here for follow up of iron-deficiency anemia, abdominal pain, bowel habit changes. After last visit with me had blood work showing an elevated alkaline phosphatase level.  This was done because the CT enterography had shown fatty liver.  GGTP was normal. He had a CT scan in January 2018 for abdominal pain at which point the radiologist commented that the fatty liver was no longer seen. He had blood work done in April 2018 showing a normal complete blood count albeit with a marginal decrease in platelet count.  His iron studies continue to showed low ferritin.  He has not had any overt gastrointestinal bleeding since his last visit with me.  He called earlier this month complaining of periumbilical abdominal pain.  Also reported liquid stools.  He had an expedited EGD and colonoscopy performed. Found to have a single small adenomatous polyp and duodenitis.  No evidence of peptic ulcer disease.  I did not visualize any GI bleeding or arteriovenous malformations.  Colonoscopy revealed a single small adenomatous polyp and was negative for microscopic colitis.  I called in the day following the procedures and he has not had a bowel movement supporting a suspicion of constipation with overflow.  He also reported that the pain improved after the bowel prep.  He had been taking MiraLax daily, 17 g, since his last visit.  Reports sensation of obstruction incomplete evacuation 25% of the time or more.  Reports excessive straining and hard stools less than 25% of the time.  Moved bowels daily.  Is on iron 3 times a day.  ROS:  Has chronic dyspnea on exertion related to COPD.   8 point review of systems negative   ??  Labs: reviewed in computer.  Complete blood count showing marginal decrease in platelet but normal white count hemoglobin hematocrit as of May 23, 2017.  Liver panel showing elevated alkaline phosphatase less than 2 times the upper limit of normal.  GGTP in 2017 normal. Had  normal alkaline phosphatase in February 2018.  Levels fluctuating up and down but all this less than 2 times the upper limit of normal.        Hepatitis-C negative in 2070.  Ferritin 54 as of May 23, 2017.  Imaging: reviewed report CT scan done January 2018.  No acute pathology.  No fatty liver.  Chronic thickening of gallbladder reported.  Records: reviewed last visit with me, EGD and colonoscopy report, path and results letter.  ??  Impression: 71 year old male patient with the following GI issues  1. 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 On oral iron. Had recieved iv iron as well. H&H stable but iron stores low. Follows with Dr. [name]. No overt gi bleeding. Worsened by being on chronic anticoagulation.  2. Adenomatous duodenal polyps:   Had a single small duodenal adenoma September 2018.  Have discussed options with patient and recommended a repeat EGD in no greater than 3 years from now.  Can consider repeat endoscopy next year especially if he has any symptoms.  3. Adenomatous colon polyps- several removed 03/2016 and a single adenoma removed September 2018.  Prep was suboptimal but sufficient for lesions greater than 6 mm.  As a compromise have decided on repeat colonoscopy no later than September 2021.  4. 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  Reports having had blood work at [facility] showing normal iron stores and CBC.  Will obtain records.  No overt GI bleeding.  5. Abnorma CT scan abdomen/ abnormal GB appearance- non specific.   Has marginal elevation in alkaline phosphatase but normal GGTP.  Pain not consistent with colic.  Has seen surgery and they recommended against cholecystectomy.  His upper abdominal pain improved with bowel prep pointing against gallbladder as cause.  6. Anal seepage: Rectoanal agnosia. Continues to be an issue but does not  affect quality of life.  I suspect constipation/ fecal impaction. Is on miralax and iron. Colonoscopy negative for IBD/tumor.  7. Fatty liver- NAFLD. Has risk factors.   Liver panel shows marginal elevation alkaline phosphatase but normal GGTP.  CT scan done January 2018 showed no evidence of fatty liver per radiologist.  Discussion:  Discussed results of EGD and colonoscopy.  Discussed timing of repeat EGD and colonoscopy.  Discussed symptoms, response to prep and likely diagnosis of constipation with overflow.  Discussed inadequate control of constipation with current dose of MiraLax.  Discussed laxative options.  Discussed side effect profile.   Discussed options to workup bloating.  Explained that I do not believe digestive enzymes will be of benefit since he has no postprandial abdominal discomfort or unexplained weight loss.  And since he does not have described steatorrhea.   Discussed elevated alkaline phosphatase and options for workup was is monitoring.  Plan:  1.  He will continue with MiraLax 17 g at bedtime.  He will begin Dulcolax suppository, 1 to be placed into the rectum, after breakfast.  He will give himself at least 30 min in the restroom.  2.  If this fails to control his constipation, will modify bowel regimen.  He will call me in 4 weeks.  If he continues to have significant abdominal bloating, I will schedule him for a breath test for intestinal bacterial overgrowth.  3.  Next EGD and colonoscopy in 3 years unless the patient develops any upper GI symptoms or blood work shows worsening iron-deficiency and/or anemia.   4.  I will provide him a lab slip for blood work for hepatitis B, ceruloplasmin, AMA and alkaline phosphatase isoenzymes.  Based on results, we will consider further workup such as MRCP and liver biopsy.    Counseling dominated visit of 25 minutes, with 20 minutes spent in face to face counseling and included discussion of care coordination, management  advice, education and various treatment options as outlined above.     RTC 6 months    Discussed the patient's BMI with him.  The BMI follow up plan is as follows:     dietary management education, guidance, and counseling  encourage exercise  monitor weight  prescribed dietary intake    An After Visit Summary was printed and given to the patient.

## 2017-06-02 NOTE — Patient Instructions (Addendum)
Gas and Bloating: Care Instructions  Your Care Instructions    Gas and bloating can be uncomfortable and embarrassing problems. All people pass gas, but some people produce more gas than others, sometimes enough to cause distress. It is normal to pass gas from 6 to 20 times per day. Excess gas usually is not caused by a serious health problem.  Gas and bloating usually are caused by something you eat or drink, including some food supplements and medicines.  Gas and bloating are usually harmless and go away without treatment. However, changing your diet can help end the problem. Some over-the-counter medicines can help prevent gas and relieve bloating.  Follow-up care is a key part of your treatment and safety. Be sure to make and go to all appointments, and call your doctor if you are having problems. It's also a good idea to know your test results and keep a list of the medicines you take.  How can you care for yourself at home?  ?? Keep a food diary if you think a food gives you gas. Write down what you eat or drink. Also record when you get gas. If you notice that a food seems to cause your gas each time, avoid it and see if the gas goes away. Examples of foods that cause gas include:  ?? Fried and fatty foods.  ?? Beans.  ?? Vegetables such as artichokes, asparagus, broccoli, brussels sprouts, cabbage, cauliflower, cucumbers, green peppers, onions, peas, radishes, and raw potatoes.  ?? Fruits such as apricots, bananas, melons, peaches, pears, prunes, and raw apples.  ?? Wheat and wheat bran.  ?? Soak dry beans in water overnight, then dump the water and cook the soaked beans in new water. This can help prevent gas and bloating.  ?? If you have problems with lactose, avoid dairy products such as milk and cheese.  ?? Try not to swallow air. Do not drink through a straw, gulp your food, or chew gum.  ?? Take an over-the-counter medicine. Read and follow all instructions on the label.   ?? Food enzymes, such as Beano, can be added to gas-producing foods to prevent gas.  ?? Antacids, such as Maalox Anti-Gas and Mylanta Gas, can relieve bloating by making you burp. Be careful when you take over-the-counter antacid medicines. Many of these medicines have aspirin in them. Read the label to make sure that you are not taking more than the recommended dose. Too much aspirin can be harmful.  ?? Activated charcoal tablets, such as CharcoCaps, may decrease odor from gas you pass.  ?? If you have problems with lactose, you can take medicines such as Dairy Ease and Lactaid with dairy products to prevent gas and bloating.  ?? Get some exercise regularly.  When should you call for help?  Call 911 anytime you think you may need emergency care. For example, call if:  ?? ?? You have gas and signs of a heart attack, such as:  ?? Chest pain or pressure.  ?? Sweating.  ?? Shortness of breath.  ?? Nausea or vomiting.  ?? Pain that spreads from the chest to the neck, jaw, or one or both shoulders or arms.  ?? Dizziness or lightheadedness.  ?? A fast or uneven pulse.  After calling 911, chew 1 adult-strength aspirin. Wait for an ambulance. Do not try to drive yourself.   ??Call your doctor now or seek immediate medical care if:  ?? ?? You have severe belly pain.   ?? ?? You  have blood in your stool.   ??Watch closely for changes in your health, and be sure to contact your doctor if:  ?? ?? You have blood or pus in your urine.   ?? ?? Your urine is cloudy or smells bad.   ?? ?? You are burping and have trouble swallowing.   ?? ?? You feel bloated and have swelling in your belly.   ?? ?? You do not get better as expected.   Where can you learn more?  Go to [employer].  Enter [phone] in the search box to learn more about "Gas and Bloating: Care Instructions."  Current as of: August 02, 2016  Content Version: 11.7  ?? 2006-2018 [employer]. Care instructions adapted under  license by [employer] (which disclaims liability or warranty for this information). If you have questions about a medical condition or this instruction, always ask your healthcare professional. [place] any warranty or liability for your use of this information.         Body Mass Index: Care Instructions  Your Care Instructions    Body mass index (BMI) can help you see if your weight is raising your risk for health problems. It uses a formula to compare how much you weigh with how tall you are.  ?? A BMI lower than 18.5 is considered underweight.  ?? A BMI between 18.5 and 24.9 is considered healthy.  ?? A BMI between 25 and 29.9 is considered overweight. A BMI of 30 or higher is considered obese.  If your BMI is in the normal range, it means that you have a lower risk for weight-related health problems. If your BMI is in the overweight or obese range, you may be at increased risk for weight-related health problems, such as high blood pressure, heart disease, stroke, arthritis or joint pain, and diabetes. If your BMI is in the underweight range, you may be at increased risk for health problems such as fatigue, lower protection (immunity) against illness, muscle loss, bone loss, hair loss, and hormone problems.  BMI is just one measure of your risk for weight-related health problems. You may be at higher risk for health problems if you are not active, you eat an unhealthy diet, or you drink too much alcohol or use tobacco products.  Follow-up care is a key part of your treatment and safety. Be sure to make and go to all appointments, and call your doctor if you are having problems. It's also a good idea to know your test results and keep a list of the medicines you take.  How can you care for yourself at home?  ?? Practice healthy eating habits. This includes eating plenty of fruits, vegetables, whole grains, lean protein, and low-fat dairy.   ?? If your doctor recommends it, get more exercise. Walking is a good choice. Bit by bit, increase the amount you walk every day. Try for at least 30 minutes on most days of the week.  ?? Do not smoke. Smoking can increase your risk for health problems. If you need help quitting, talk to your doctor about stop-smoking programs and medicines. These can increase your chances of quitting for good.  ?? Limit alcohol to 2 drinks a day for men and 1 drink a day for women. Too much alcohol can cause health problems.  If you have a BMI higher than 25  ?? Your doctor may do other tests to check your risk for weight-related health problems. This may include measuring  the distance around your waist. A waist measurement of more than 40 inches in men or 35 inches in women can increase the risk of weight-related health problems.  ?? Talk with your doctor about steps you can take to stay healthy or improve your health. You may need to make lifestyle changes to lose weight and stay healthy, such as changing your diet and getting regular exercise.  If you have a BMI lower than 18.5  ?? Your doctor may do other tests to check your risk for health problems.  ?? Talk with your doctor about steps you can take to stay healthy or improve your health. You may need to make lifestyle changes to gain or maintain weight and stay healthy, such as getting more healthy foods in your diet and doing exercises to build muscle.  Where can you learn more?  Go to [employer].  Enter [id number] in the search box to learn more about "Body Mass Index: Care Instructions."  Current as of: June 26, 2015  Content Version: 11.4  ?? 2006-2017 Healthwise, [employer]. Care instructions adapted under license by [employer] (which disclaims liability or warranty for this information). If you have questions about a medical condition or this instruction, always ask your healthcare professional. [place] any warranty or liability for your use of this information.

## 2017-08-22 ENCOUNTER — Inpatient Hospital Stay

## 2017-08-22 ENCOUNTER — Emergency Department: Admit: 2017-08-22 | Payer: PRIVATE HEALTH INSURANCE | Primary: Family Medicine

## 2017-08-22 ENCOUNTER — Inpatient Hospital Stay
Admit: 2017-08-22 | Discharge: 2017-08-24 | Disposition: A | Discharge status: Home / Self Care | Payer: PRIVATE HEALTH INSURANCE | Attending: Internal Medicine | Admitting: Internal Medicine

## 2017-08-22 DIAGNOSIS — K529 Noninfective gastroenteritis and colitis, unspecified: Principal | ICD-10-CM

## 2017-08-22 LAB — EKG 12-LEAD
ECG QTC Interval: 392 ms
EKG QRS AXIS: -31 deg
EKG QRS INTERVAL: 122 ms
EKG QTC INTERVAL: 464 ms
EKG T WAVE AXIS: 74 deg
Heart Rate: 84 {beats}/min

## 2017-08-22 LAB — CBC WITH AUTOMATED DIFF
ABS. BASOPHILS: 0 10*3/uL (ref 0.0–0.1)
ABS. EOSINOPHILS: 0.2 10*3/uL (ref 0.0–0.5)
ABS. IMM. GRANS.: 0 10*3/uL (ref 0.00–0.03)
ABS. LYMPHOCYTES: 1.3 10*3/uL (ref 1.2–3.7)
ABS. MONOCYTES: 0.3 10*3/uL (ref 0.2–0.8)
ABS. NEUTROPHILS: 10.8 10*3/uL — ABNORMAL HIGH (ref 1.6–6.1)
BASOPHILS: 0 % (ref 0–1.2)
EOSINOPHILS: 1 %
HCT: 44.2 % (ref 40.1–51.0)
HGB: 14.6 g/dL (ref 13.7–17.5)
IMMATURE GRANULOCYTES: 0 % (ref 0.0–0.4)
LYMPHOCYTES: 10 %
MCH: 31.1 PG (ref 25.6–32.2)
MCHC: 33 g/dL (ref 32.2–35.5)
MCV: 94.2 FL (ref 80.0–95.0)
MONOCYTES: 3 %
MPV: 9.6 FL (ref 9.4–12.4)
NEUTROPHILS: 86 %
PLATELET: 136 10*3/uL — ABNORMAL LOW (ref 150–400)
RBC: 4.69 M/uL (ref 4.51–5.93)
RDW: 15.2 % — ABNORMAL HIGH (ref 11.6–14.4)
WBC: 12.7 10*3/uL — ABNORMAL HIGH (ref 4.0–10.0)

## 2017-08-22 LAB — LACTIC ACID: Lactic acid: 1.8 MMOL/L (ref 0.4–2.0)

## 2017-08-22 LAB — METABOLIC PANEL, COMPREHENSIVE
A-G Ratio: 1.2 (ref 1.0–3.0)
ALT (SGPT): 30 U/L (ref 16–61)
AST (SGOT): 13 U/L — ABNORMAL LOW (ref 15–37)
Albumin: 3.5 g/dL (ref 3.4–5.0)
Alk. phosphatase: 95 U/L (ref 45–117)
Anion gap: 7 mmol/L (ref 5–15)
BUN/Creatinine ratio: 18 (ref 12–20)
BUN: 23 MG/DL — ABNORMAL HIGH (ref 7–18)
Bilirubin, total: 0.89 mg/dL (ref 0.20–1.20)
CO2: 30 mmol/L (ref 21–32)
Calcium: 10.1 MG/DL (ref 8.5–10.1)
Chloride: 101 mmol/L (ref 98–110)
Creatinine: 1.28 MG/DL (ref 0.70–1.30)
GFR est AA: >60 mL/min/{1.73_m2} (ref >60)
GFR est non-AA: 59 mL/min/{1.73_m2} — ABNORMAL LOW (ref >60)
Globulin: 2.9 g/dL (ref 2.5–3.5)
Glucose: 149 mg/dL — ABNORMAL HIGH (ref 70–100)
Potassium: 4.1 mmol/L (ref 3.5–5.1)
Protein, total: 6.4 g/dL (ref 6.4–8.2)
Sodium: 138 mmol/L (ref 136–145)

## 2017-08-22 LAB — UA WITH REFLEX MICRO AND CULTURE
Bilirubin: NEGATIVE
Blood: NEGATIVE
Glucose: NEGATIVE mg/dL
Ketone: NEGATIVE mg/dL
Leukocyte Esterase: NEGATIVE
Nitrites: NEGATIVE
Specific gravity: >1.035 — ABNORMAL HIGH (ref 1.001–1.035)
Urobilinogen: 1 EU/dL (ref 0.2–1.0)
pH (UA): 6.5 (ref 5–8)

## 2017-08-22 LAB — EKG, 12 LEAD, INITIAL
Heart Rate: 84 {beats}/min
QRS Axis: -31 deg
QRS Interval: 122 ms
QT Interval: 392 ms
QTC Interval: 464 ms
T Wave Axis: 74 deg

## 2017-08-22 LAB — LIPASE: Lipase: 74 U/L (ref 73–393)

## 2017-08-22 LAB — PROTHROMBIN TIME + INR
INR: 1 (ref 0.7–1.0)
Prothrombin time: 10.5 s (ref 9.7–11.4)

## 2017-08-22 MED ORDER — ONDANSETRON 4 MG TAB, RAPID DISSOLVE
4 mg | Freq: Four times a day (QID) | ORAL | Status: DC | PRN
Start: 2017-08-22 — End: 2017-08-24

## 2017-08-22 MED ORDER — SODIUM CHLORIDE 0.9 % IJ SYRG
INTRAMUSCULAR | Status: DC | PRN
Start: 2017-08-22 — End: 2017-08-24

## 2017-08-22 MED ORDER — MORPHINE 2 MG/ML INJECTION
2 mg/mL | Freq: Four times a day (QID) | INTRAMUSCULAR | Status: DC | PRN
Start: 2017-08-22 — End: 2017-08-24
  Administered 2017-08-23: 16:00:00 via INTRAVENOUS

## 2017-08-22 MED ORDER — SODIUM CHLORIDE 0.9 % IJ SYRG
Freq: Two times a day (BID) | INTRAMUSCULAR | Status: DC
Start: 2017-08-22 — End: 2017-08-24
  Administered 2017-08-23 – 2017-08-24 (×4): via INTRAVENOUS

## 2017-08-22 MED ORDER — NALOXONE 0.4 MG/ML INJECTION
0.4 mg/mL | INTRAMUSCULAR | Status: DC | PRN
Start: 2017-08-22 — End: 2017-08-24

## 2017-08-22 MED ORDER — SODIUM CHLORIDE 0.9 % IV
INTRAVENOUS | Status: DC
Start: 2017-08-22 — End: 2017-08-23
  Administered 2017-08-23 (×2): via INTRAVENOUS

## 2017-08-22 MED ORDER — DEXTROSE 40 % ORAL GEL
40 % | ORAL | Status: DC | PRN
Start: 2017-08-22 — End: 2017-08-24

## 2017-08-22 MED ORDER — SODIUM CHLORIDE 0.9% BOLUS IV
0.9 % | Freq: Once | INTRAVENOUS | Status: AC
Start: 2017-08-22 — End: 2017-08-22
  Administered 2017-08-22: 18:00:00 via INTRAVENOUS

## 2017-08-22 MED ORDER — ONDANSETRON (PF) 4 MG/2 ML INJECTION
4 mg/2 mL | Freq: Four times a day (QID) | INTRAMUSCULAR | Status: DC | PRN
Start: 2017-08-22 — End: 2017-08-24

## 2017-08-22 MED ORDER — ALBUTEROL SULFATE 0.083 % (0.83 MG/ML) SOLN FOR INHALATION
2.5 mg /3 mL (0.083 %) | Freq: Four times a day (QID) | RESPIRATORY_TRACT | Status: DC | PRN
Start: 2017-08-22 — End: 2017-08-22

## 2017-08-22 MED ORDER — LEVOFLOXACIN IN D5W 500 MG/100 ML IV PIGGY BACK
500 mg/100 mL | INTRAVENOUS | Status: DC
Start: 2017-08-22 — End: 2017-08-24
  Administered 2017-08-23: 21:00:00 via INTRAVENOUS

## 2017-08-22 MED ORDER — BUDESONIDE-FORMOTEROL HFA 160 MCG-4.5 MCG/ACTUATION AEROSOL INHALER
Freq: Two times a day (BID) | RESPIRATORY_TRACT | Status: DC
Start: 2017-08-22 — End: 2017-08-22

## 2017-08-22 MED ORDER — GLUCAGON 1 MG INJECTION
1 mg | INTRAMUSCULAR | Status: DC | PRN
Start: 2017-08-22 — End: 2017-08-24

## 2017-08-22 MED ORDER — METOPROLOL TARTRATE 5 MG/5 ML IV SOLN
5 mg/ mL | Freq: Three times a day (TID) | INTRAVENOUS | Status: DC
Start: 2017-08-22 — End: 2017-08-23
  Administered 2017-08-23 (×2): via INTRAVENOUS

## 2017-08-22 MED ORDER — METOPROLOL TARTRATE 5 MG/5 ML IV SOLN
5 mg/ mL | Freq: Three times a day (TID) | INTRAVENOUS | Status: DC | PRN
Start: 2017-08-22 — End: 2017-08-24

## 2017-08-22 MED ORDER — DEXTROSE 10% IN WATER (D10W) IV
10 % | INTRAVENOUS | Status: DC | PRN
Start: 2017-08-22 — End: 2017-08-24

## 2017-08-22 MED ORDER — HEPARIN (PORCINE) 5,000 UNIT/ML IJ SOLN
5000 unit/mL | Freq: Three times a day (TID) | INTRAMUSCULAR | Status: DC
Start: 2017-08-22 — End: 2017-08-23
  Administered 2017-08-23: 19:00:00 via SUBCUTANEOUS

## 2017-08-22 MED ORDER — MOMETASONE-FORMOTEROL HFA 200 MCG-5 MCG/ACTUATION AEROSOL INHALER
200-5 mcg/actuation | Freq: Two times a day (BID) | RESPIRATORY_TRACT | Status: DC
Start: 2017-08-22 — End: 2017-08-24
  Administered 2017-08-23 – 2017-08-24 (×3): via RESPIRATORY_TRACT

## 2017-08-22 MED ORDER — GLUCOSE 4 GRAM CHEWABLE TAB
4 gram | ORAL | Status: DC | PRN
Start: 2017-08-22 — End: 2017-08-24

## 2017-08-22 MED ORDER — FENTANYL CITRATE (PF) 50 MCG/ML IJ SOLN
50 mcg/mL | INTRAMUSCULAR | Status: AC
Start: 2017-08-22 — End: 2017-08-22
  Administered 2017-08-22: 18:00:00 via INTRAVENOUS

## 2017-08-22 MED ORDER — IOHEXOL 350 MG IODINE/ML INTRAVENOUS SOLUTION
350 mg iodine/mL | Freq: Once | INTRAVENOUS | Status: AC
Start: 2017-08-22 — End: 2017-08-22
  Administered 2017-08-22: 19:00:00 via INTRAVENOUS

## 2017-08-22 MED ORDER — METRONIDAZOLE IN SODIUM CHLORIDE (ISO-OSM) 500 MG/100 ML IV PIGGY BACK
500 mg/100 mL | Freq: Three times a day (TID) | INTRAVENOUS | Status: DC
Start: 2017-08-22 — End: 2017-08-24
  Administered 2017-08-23 – 2017-08-24 (×5): via INTRAVENOUS

## 2017-08-22 MED ORDER — METRONIDAZOLE IN SODIUM CHLORIDE (ISO-OSM) 500 MG/100 ML IV PIGGY BACK
500 mg/100 mL | INTRAVENOUS | Status: AC
Start: 2017-08-22 — End: 2017-08-22
  Administered 2017-08-22: 23:00:00 via INTRAVENOUS

## 2017-08-22 MED ORDER — LEVOFLOXACIN IN D5W 750 MG/150 ML IV PIGGY BACK
750 mg/150 mL | INTRAVENOUS | Status: AC
Start: 2017-08-22 — End: 2017-08-22
  Administered 2017-08-22: 21:00:00 via INTRAVENOUS

## 2017-08-22 MED ORDER — FENTANYL CITRATE (PF) 50 MCG/ML IJ SOLN
50 mcg/mL | INTRAMUSCULAR | Status: AC
Start: 2017-08-22 — End: 2017-08-22
  Administered 2017-08-22: 20:00:00 via INTRAVENOUS

## 2017-08-22 MED FILL — SODIUM CHLORIDE 0.9 % IV: INTRAVENOUS | Qty: 1000

## 2017-08-22 MED FILL — GLUCOSE 4 GRAM CHEWABLE TAB: 4 gram | ORAL | Qty: 4

## 2017-08-22 MED FILL — LEVOFLOXACIN IN D5W 750 MG/150 ML IV PIGGY BACK: 750 mg/150 mL | INTRAVENOUS | Qty: 150

## 2017-08-22 MED FILL — NORMAL SALINE FLUSH 0.9 % INJECTION SYRINGE: INTRAMUSCULAR | Qty: 10

## 2017-08-22 MED FILL — OMNIPAQUE 350 MG IODINE/ML INTRAVENOUS SOLUTION: 350 mg iodine/mL | INTRAVENOUS | Qty: 90

## 2017-08-22 MED FILL — METRONIDAZOLE IN SODIUM CHLORIDE (ISO-OSM) 500 MG/100 ML IV PIGGY BACK: 500 mg/100 mL | INTRAVENOUS | Qty: 100

## 2017-08-22 MED FILL — FENTANYL CITRATE (PF) 50 MCG/ML IJ SOLN: 50 mcg/mL | INTRAMUSCULAR | Qty: 2

## 2017-08-22 MED FILL — DEXTROSE 10% IN WATER (D10W) IV: 10 % | INTRAVENOUS | Qty: 500

## 2017-08-22 NOTE — Progress Notes (Signed)
Pt's Aerosolized Medication Protocol Score: 5 (4-6)    Pt is here with Afib and abdominal pain. Pt has a hx of COPD and is a former smoker. Pt takes Symbicort BID, Atrovent QID, and Xopenex Q4 PRN. Pt denies an allergy to Albuterol and states that he gets these medications cheaper through his insurance. Will order pt on Duonebs QID and an Albuterol neb Q4 PRN; RT f/u QID.

## 2017-08-22 NOTE — ED Notes (Signed)
Care of patient assumed by this RN at this time from [facility]

## 2017-08-22 NOTE — Consults (Signed)
Consults  by Valetta Fuller, MD at 08/22/17 1550                Author: Valetta Fuller, MD  Service: Gastroenterology  Author Type: Physician       Filed: 08/23/17 0712  Date of Service: 08/22/17 1550  Status: Addendum          Editor: Valetta Fuller, MD (Physician)          Related Notes: Original Note by Jeanella Craze, PA-C (Physician Assistant) filed at  08/22/17 1703                               Consult          Patient: Paul [name]  MRN: [id number]   SSN: [id number]          Date of Birth: Oct 04, 1945   Age: 71 y.o.   Sex: male           Subjective:         Paul [name] is a 71 y.o. male who is being seen for abdominal pain and abnormal CT Scan.      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         For the above issues he has undergone extensive GI testing including:   1. EGD/colonoscopy 05/30/17: chronic gastritis (H.Pylori negative), gastric nodule, adenomatous duodenal polyp, single adenomatous colon polyp, diverticulosis, and external hemorrhoids.  Random colon biopsies negative for microscopic colitis.  Repeat recommended  in 3 years.   2. CT abdomen/pelvis 09/25/16: resolution of previously noted fatty infiltration liver, bilateral renal cysts, normal gallbladder appearance, mildly prominent periportal lymph nodes stable since 2/14, scattered subcm retroperitoneal lymph nodes, numerous  scattered diverticula, small bilateral inguinal hernias, prostate enlargement      3. CT enterography 01/22/16: colonic diverticulosis, fatty appearing liver, nonspecific edematous change around the gallbladder, bilateral small fat filled inguinal hernias, bilateral renal cysts, coronary artery calcifications, prostate enlargement   4. 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 EGD and colonoscopy 04/12/16: Seven sessile duodenal polyps, mild gastritis, 6 sub cm polyps, pan diverticulosis, s/p sigmoid resection with normal appearing anastomosis, external hemorrhoids   6. Capsule endoscopy 01/26/16: gastritis, duodenal non bleeding AVM and polyps.  Incomplete/suboptimal exam due to intraluminal contents      For the iron deficiency anemia, there has been no overt bleeding.  His blood counts have normalized with a daily iron supplement.        Regarding the abdominal pain, it had been periumbilical and improved with the prep for the colonoscopy suggesting constipation as a possible cause.  Patient states he was doing very well with Miralax 17 grams every day until last evening.  Had abrupt  onset of generalized abdominal pain and distension during the night.  Pain is unlike anything he has experienced in the past.  He denies associated nausea/vomiting.  He has not moved his bowels since yesterday morning but is passing flatus.        Work up in the emergency room noted a mildly elevated WBC (12.7), normal H&H (hgb 14.6), minimally  depressed platelet count (136k chronic), minimally elevated BUN (23) with normal Cr (1.28), normal LFTs, lipase, and lactic acid.  CT abdomen/pelvis noted  2 areas of bowel wall thickening of the mid ileum with luminal narrowing causing an obstructive pattern.  He has been started on antibiotics (Levaquin and flagyl) and will be admitted for observation.        Continues to have pain, but improved with medication.  He was started on a ARB medication within the last several months.          Past Medical History:        Diagnosis  Date         ?  A-fib ([place])       ?  Adenomatous colon polyp            SERRATED; DUE 18/19         ?  Adenomatous duodenal polyp       ?  AVM  (arteriovenous malformation)            DUODENAL         ?  CAD (coronary artery disease)            SP MI         ?  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  COPD (chronic obstructive pulmonary disease) ([facility])            QUIT TOBACCO SMOKING 2 YRS AGO         ?  Diabetes ([place])       ?  Diverticulosis of colon       ?  Dyslipidemia       ?  Hiatal hernia       ?  History of blood transfusion  2017          FOR PROFOUND ANEMIA         ?  IBS (irritable bowel syndrome)       ?  Iron deficiency            SEVERE-INFUSIONS 5/17          Past Surgical History:         Procedure  Laterality  Date          ?  COLONOSCOPY  N/A  05/30/2017          COLONOSCOPY performed by Valetta Fuller, MD at [place]          ?  HX COLONOSCOPY    2013          RECOMMENDED 5 YR FOLLOWUP; REPEAT 4/17-VILLOUS ADENOMA SIGMOID, TUBULAR ADENOMA OF TRANSVERSE COLON, ILEUM AND ASCENDING COLON-CD3 IMMUNOSTAIN NEG          ?  HX ENDOSCOPY              2013-GASTRIC ULCERS; 2017 GASTRITIS; ADENOMTOUS DUODENAL POLYPS, NO BLEEDING          ?  HX HEART CATHETERIZATION    2003          Stent x 1          ?  HX OTHER ARTIFICIAL OPENING              DIAGNOSTIC BRONCHOSCOPY          ?  HX OTHER SURGICAL  Right  06/2014  MIDDLE LOBE RESECTION          ?  HX PTCA         ?  HX SMALL BOWEL RESECTION    1994          "STOMACH HAD ATTACHED ITSELF TO SMALL BOWEL"          ?  PR COLONOSCOPY W/BIOPSY SINGLE/MULTIPLE    05/30/2017                     ?  PR COLSC FLX W/RMVL OF TUMOR POLYP LESION SNARE TQ    05/30/2017                     ?  PR EGD REMOVAL TUMOR POLYP/OTHER LESION SNARE [facility]    05/30/2017                     ?  PR EGD TRANSORAL BIOPSY SINGLE/MULTIPLE    05/30/2017                      Family History       Adopted: Yes       Family history unknown: Yes          Social History          Tobacco Use         ?  Smoking status:  Former Smoker              Last attempt to quit:  06/2014         Years since quitting:  3.1         ?   Smokeless tobacco:  Never Used       Substance Use Topics         ?  Alcohol use:  No           Current Facility-Administered Medications             Medication  Dose  Route  Frequency  Provider  Last Rate  Last Dose              ?  levoFLOXacin (LEVAQUIN) 750 mg in D5W IVPB   750 mg  IntraVENous  NOW  Audrie Lia., DO  100 mL/hr at 08/22/17 1536  750 mg at 08/22/17 1536     ?  metroNIDAZOLE (FLAGYL) IVPB premix 500 mg   500 mg  IntraVENous  NOW  Barron Alvine J., DO           ?  metoprolol (LOPRESSOR) injection 2.5 mg   2.5 mg  IntraVENous  Q8H  Rohatgi, Ankit, MD           ?  [START ON 08/23/2017] metoprolol (LOPRESSOR) injection 2.5 mg   2.5 mg  IntraVENous  Q8H PRN  Rohatgi, Ankit, MD           ?  albuterol (PROVENTIL VENTOLIN) nebulizer solution 2.5 mg   2.5 mg  Nebulization  Q6H PRN  Rohatgi, Ankit, MD                    ?  budesonide-formoterol (SYMBICORT) 160-4.5 mcg/actuation HFA inhaler 2 Puff   2 Puff  Inhalation  BID  Rohatgi, Ankit, MD                Current Outpatient Medications          Medication  Sig  Dispense  Refill           ?  metFORMIN (GLUCOPHAGE) 500 mg tablet  Take  by mouth two (2) times daily (with meals).         ?  omeprazole (PRILOSEC) 40 mg capsule  Take 40 mg by mouth two (2) times a day.         ?  allopurinol (ZYLOPRIM) 300 mg tablet  Take 300 mg by mouth daily.               ?  ALBUTEROL SULFATE PO  Take 3 Mls by nebulization every 4 hours as needed for wheezing   Indications: Albuterol Sulfate (2.5 MG/3ML) 0.083% Inhalation Nebulizatio (Albuterol  Sulfate)               ?  traMADol (ULTRAM) 50 mg tablet  Take 50 mg by mouth every six (6) hours as needed.         ?  traZODone (DESYREL) 100 mg tablet  Take 100 mg by mouth nightly.         ?  tamsulosin (FLOMAX) 0.4 mg capsule  Take 0.4 mg by mouth daily.         ?  metFORMIN (GLUCOPHAGE) 850 mg tablet  Take 500 mg by mouth daily. Indications: daily or twice a day         ?  budesonide-formoterol (SYMBICORT) 160-4.5  mcg/actuation HFAA  Take as directed         ?  dicyclomine (BENTYL) 20 mg tablet  Take 20 mg by mouth every six (6) hours. As needed         ?  losartan (COZAAR) 25 mg tablet  Take 50 mg by mouth daily.         ?  apixaban (ELIQUIS) 5 mg tablet  Take 5 mg by mouth two (2) times a day.         ?  atorvastatin (LIPITOR) 40 mg tablet  Take 40 mg by mouth daily.         ?  aspirin delayed-release 81 mg tablet  Take 81 mg by mouth daily.         ?  Calcium-Cholecalciferol, D3, 500 mg(1,244m) -400 unit tab  Take 1 Tab by mouth daily.         ?  aclidinium bromide (TUDORZA PRESSAIR) 400 mcg/actuation inhaler  Take 1 Puff by inhalation two (2) times a day.         ?  polyethylene glycol (MIRALAX) 17 gram/dose powder  take 17gms by disolved in water daily as needed for constipation               ?  ferrous sulfate 325 mg (65 mg iron) tablet  Take  by mouth Daily (before breakfast).                No Known Allergies      Review of Systems:   Review of Systems - Negative except abdominal pain.                Objective:          Vitals:           08/22/17 1211  08/22/17 1336         BP:  124/65  120/71     Pulse:  90  65     Resp:  16  12     Temp:  98.1 ??F (36.7 ??C)       SpO2:  94%  94%     Weight:  86.2 kg (190 lb)           Height:  5' 9"  (1.753 m)              Physical Exam:   Physical Examination: General appearance - alert, well appearing.   HEENT: NC/AT, PER, EOMI, sclera anicteric, OP clear, neck supple   Chest - clear to auscultation   Heart - irregularly irregular   Abdomen - soft, distended, diffuse tenderness, hyperactive bowel sounds   Neurological - alert, oriented, normal speech, no focal findings or movement disorder noted   Musculoskeletal - no joint tenderness, deformity or swelling   Extremities - peripheral pulses normal, no pedal edema, no clubbing or cyanosis   Skin - normal coloration and turgor, no rashes, no suspicious skin lesions noted        Recent Results (from the past 24 hour(s))     CBC WITH  AUTOMATED DIFF          Collection Time: 08/22/17 12:42 PM         Result  Value  Ref Range            WBC  12.7 (H)  4.0 - 10.0 K/uL       RBC  4.69  4.51 - 5.93 M/uL       HGB  14.6  13.7 - 17.5 g/dL       HCT  44.2  40.1 - 51.0 %       MCV  94.2  80.0 - 95.0 FL       MCH  31.1  25.6 - 32.2 PG       MCHC  33.0  32.2 - 35.5 g/dL       RDW  15.2 (H)  11.6 - 14.4 %       PLATELET  136 (L)  150 - 400 K/uL       MPV  9.6  9.4 - 12.4 FL       NEUTROPHILS  86  %       LYMPHOCYTES  10  %       MONOCYTES  3  %       EOSINOPHILS  1  %       BASOPHILS  0  0 - 1.2 %       ABS. NEUTROPHILS  10.8 (H)  1.6 - 6.1 K/UL       ABS. LYMPHOCYTES  1.3  1.2 - 3.7 K/UL       ABS. MONOCYTES  0.3  0.2 - 0.8 K/UL       ABS. EOSINOPHILS  0.2  0.0 - 0.5 K/UL       ABS. BASOPHILS  0.0  0.0 - 0.1 K/UL       IMMATURE GRANULOCYTES  0  0.0 - 0.4 %       ABS. IMM. GRANS.  0.0  0.00 - 0.03 K/UL       LIPASE          Collection Time: 08/22/17 12:42 PM         Result  Value  Ref Range            Lipase  74  73 - 393 U/L       METABOLIC PANEL, COMPREHENSIVE          Collection Time: 08/22/17 12:42 PM         Result  Value  Ref Range  Sodium  138  136 - 145 mmol/L       Potassium  4.1  3.5 - 5.1 mmol/L       Chloride  101  98 - 110 mmol/L       CO2  30  21 - 32 mmol/L       Anion gap  7  5 - 15 mmol/L       Glucose  149 (H)  70 - 100 mg/dL       BUN  23 (H)  7 - 18 MG/DL       Creatinine  1.28  0.70 - 1.30 MG/DL       BUN/Creatinine ratio  18  12 - 20       GFR est AA  >60  >60 ml/min/1.65m       GFR est non-AA  59 (L)  >60 ml/min/1.777m      Calcium  10.1  8.5 - 10.1 MG/DL       Bilirubin, total  0.89  0.20 - 1.20 mg/dL       ALT (SGPT)  30  16 - 61 U/L       AST (SGOT)  13 (L)  15 - 37 U/L       Alk. phosphatase  95  45 - 117 U/L       Protein, total  6.4  6.4 - 8.2 g/dL       Albumin  3.5  3.4 - 5.0 g/dL       Globulin  2.9  2.5 - 3.5 g/dL       A-G Ratio  1.2  1.0 - 3.0         UA WITH REFLEX MICRO AND CULTURE          Collection Time:  08/22/17 12:42 PM         Result  Value  Ref Range            Color  YELLOW  YELLOW       Appearance  CLEAR  CLEAR       Specific gravity  >1.035 (H)  1.001 - 1.035       pH (UA)  6.5  5 - 8       Protein  TRACE (A)  NEGATIVE mg/dL       Glucose  NEGATIVE   NEGATIVE mg/dL       Ketone  NEGATIVE   NEGATIVE mg/dL       Bilirubin  NEGATIVE   NEGATIVE       Blood  NEGATIVE   NEGATIVE       Urobilinogen  1.0  0.2 - 1.0 EU/dL       Nitrites  NEGATIVE   NEGATIVE       Leukocyte Esterase  NEGATIVE   NEGATIVE       PROTHROMBIN TIME + INR          Collection Time: 08/22/17 12:42 PM         Result  Value  Ref Range            Prothrombin time  10.5  9.7 - 11.4 sec       INR  1.0  0.7 - 1.0       EKG, 12 LEAD, INITIAL          Collection Time: 08/22/17 12:52 PM         Result  Value  Ref Range  Heart Rate  84  bpm       QRS Interval  122  ms       QT Interval  392  ms       QTC Interval  464  ms       P Axis    deg       QRS Axis  -31  deg       T Wave Axis  74  deg            P-R Interval    msec             Assessment:     71 year old male with multiple medical problems including history of 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  He has undergone an extensive GI work up over the recent years including multiple EGDs, colonoscopies, abdominal CTs and capsule endoscopy.  He presents  to the emergency department due to acute generalized abdominal pain and distension since last evening.  Labs show a mild leukocytosis.  CT abdomen/pelvis note 2 areas of bowel wall thickening in the mid ileum beyond the reach of a traditional scope.   Areas appear to be causing an obstruction.      Discussed the case with Dr. Berton Lan.  Findings consistent with an acute enteritis.  Etiology is not clear.  Less suspicious of crohn's as he has undergone an extensive GI work up to date without  findings to support this.  Its possible the recently started  ARB is causing angioedema.            Plan:     1. Recommend stopping the ARB and switching to an alternative antihypertensive   2. Recommend clear liquid diet    3. Agree with antibiotic coverage   4. Continue supportive management   5. If condition improves, will plan for repeat enterography study in 4-6 weeks to document healing         Signed By:  Jeanella Craze, PA-C           August 22, 2017         This patient was seen by me in person in addition to the allied provider. The chart was reviewed, the patient examined, the plan was discussed with the patient and I agree with the above note and plan.

## 2017-08-22 NOTE — Consults (Addendum)
Consult    Patient: Paul [name] MRN: [id number]  SSN: [id number]    Date of Birth: 03-09-1946  Age: 71 y.o.  Sex: male      Subjective:      Paul [name] is a 71 y.o. male who is being seen for abdominal pain and abnormal CT Scan.    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      For the above issues he has undergone extensive GI testing including:  1. EGD/colonoscopy 05/30/17: chronic gastritis (H.Pylori negative), gastric nodule, adenomatous duodenal polyp, single adenomatous colon polyp, diverticulosis, and external hemorrhoids.  Random colon biopsies negative for microscopic colitis.  Repeat recommended in 3 years.  2. CT abdomen/pelvis 09/25/16: resolution of previously noted fatty infiltration liver, bilateral renal cysts, normal gallbladder appearance, mildly prominent periportal lymph nodes stable since 2/14, scattered subcm retroperitoneal lymph nodes, numerous scattered diverticula, small bilateral inguinal hernias, prostate enlargement     3. CT enterography 01/22/16: colonic diverticulosis, fatty appearing liver, nonspecific edematous change around the gallbladder, bilateral small fat filled inguinal hernias, bilateral renal cysts, coronary artery calcifications, prostate enlargement  4. 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 EGD and colonoscopy 04/12/16: Seven sessile duodenal polyps, mild gastritis, 6 sub cm polyps, pan diverticulosis, s/p sigmoid resection with normal appearing anastomosis, external hemorrhoids  6. Capsule endoscopy 01/26/16: gastritis, duodenal non bleeding AVM and  polyps.  Incomplete/suboptimal exam due to intraluminal contents    For the iron deficiency anemia, there has been no overt bleeding.  His blood counts have normalized with a daily iron supplement.      Regarding the abdominal pain, it had been periumbilical and improved with the prep for the colonoscopy suggesting constipation as a possible cause.  Patient states he was doing very well with Miralax 17 grams every day until last evening.  Had abrupt onset of generalized abdominal pain and distension during the night.  Pain is unlike anything he has experienced in the past.  He denies associated nausea/vomiting.  He has not moved his bowels since yesterday morning but is passing flatus.      Work up in the emergency room noted a mildly elevated WBC (12.7), normal H&H (hgb 14.6), minimally depressed platelet count (136k chronic), minimally elevated BUN (23) with normal Cr (1.28), normal LFTs, lipase, and lactic acid.  CT abdomen/pelvis noted 2 areas of bowel wall thickening of the mid ileum with luminal narrowing causing an obstructive pattern.  He has been started on antibiotics (Levaquin and flagyl) and will be admitted for observation.      Continues to have pain, but improved with medication.  He was started on a ARB medication within the last several months.      Past Medical History:   Diagnosis Date   ??? A-fib ([place])    ??? Adenomatous colon polyp     SERRATED; DUE 18/19   ??? Adenomatous duodenal polyp    ??? AVM (arteriovenous malformation)     DUODENAL   ??? CAD (coronary artery disease)     SP MI   ??? Cancer of right lung ([place])     SP SX 2015   ??? COPD (chronic obstructive pulmonary disease) ([place])  QUIT TOBACCO SMOKING 2 YRS AGO   ??? Diabetes ([place])    ??? Diverticulosis of colon    ??? Dyslipidemia    ??? Hiatal hernia    ??? History of blood transfusion 2017    FOR PROFOUND ANEMIA   ??? IBS (irritable bowel syndrome)    ??? Iron deficiency     SEVERE-INFUSIONS 5/17     Past Surgical History:   Procedure Laterality Date    ??? COLONOSCOPY N/A 05/30/2017    COLONOSCOPY performed by Valetta Fuller, MD at [place]   ??? HX COLONOSCOPY  2013    RECOMMENDED 5 YR FOLLOWUP; REPEAT 4/17-VILLOUS ADENOMA SIGMOID, TUBULAR ADENOMA OF TRANSVERSE COLON, ILEUM AND ASCENDING COLON-CD3 IMMUNOSTAIN NEG   ??? HX ENDOSCOPY      2013-GASTRIC ULCERS; 2017 GASTRITIS; ADENOMTOUS DUODENAL POLYPS, NO BLEEDING   ??? HX HEART CATHETERIZATION  2003    Stent x 1   ??? HX OTHER ARTIFICIAL OPENING      DIAGNOSTIC BRONCHOSCOPY   ??? HX OTHER SURGICAL Right 06/2014    MIDDLE LOBE RESECTION   ??? HX PTCA     ??? HX SMALL BOWEL RESECTION  1994    "STOMACH HAD ATTACHED ITSELF TO SMALL BOWEL"   ??? PR COLONOSCOPY W/BIOPSY SINGLE/MULTIPLE  05/30/2017        ??? PR COLSC FLX W/RMVL OF TUMOR POLYP LESION SNARE TQ  05/30/2017        ??? PR EGD REMOVAL TUMOR POLYP/OTHER LESION SNARE [facility]  05/30/2017        ??? PR EGD TRANSORAL BIOPSY SINGLE/MULTIPLE  05/30/2017           Family History   Adopted: Yes   Family history unknown: Yes     Social History     Tobacco Use   ??? Smoking status: Former Smoker     Last attempt to quit: 06/2014     Years since quitting: 3.1   ??? Smokeless tobacco: Never Used   Substance Use Topics   ??? Alcohol use: No      Current Facility-Administered Medications   Medication Dose Route Frequency Provider Last Rate Last Dose   ??? levoFLOXacin (LEVAQUIN) 750 mg in D5W IVPB  750 mg IntraVENous NOW Audrie Lia., DO 100 mL/hr at 08/22/17 1536 750 mg at 08/22/17 1536   ??? metroNIDAZOLE (FLAGYL) IVPB premix 500 mg  500 mg IntraVENous NOW Barron Alvine J., DO       ??? metoprolol (LOPRESSOR) injection 2.5 mg  2.5 mg IntraVENous Q8H Rohatgi, Ankit, MD       ??? [START ON 08/23/2017] metoprolol (LOPRESSOR) injection 2.5 mg  2.5 mg IntraVENous Q8H PRN Rohatgi, Ankit, MD       ??? albuterol (PROVENTIL VENTOLIN) nebulizer solution 2.5 mg  2.5 mg Nebulization Q6H PRN Rohatgi, Ankit, MD       ??? budesonide-formoterol (SYMBICORT) 160-4.5 mcg/actuation HFA inhaler 2  Puff  2 Puff Inhalation BID Rohatgi, Ankit, MD         Current Outpatient Medications   Medication Sig Dispense Refill   ??? metFORMIN (GLUCOPHAGE) 500 mg tablet Take  by mouth two (2) times daily (with meals).     ??? omeprazole (PRILOSEC) 40 mg capsule Take 40 mg by mouth two (2) times a day.     ??? allopurinol (ZYLOPRIM) 300 mg tablet Take 300 mg by mouth daily.     ??? ALBUTEROL SULFATE PO Take 3 Mls by nebulization every 4 hours as needed for wheezing   Indications:  Albuterol Sulfate (2.5 MG/3ML) 0.083% Inhalation Nebulizatio (Albuterol Sulfate)     ??? traMADol (ULTRAM) 50 mg tablet Take 50 mg by mouth every six (6) hours as needed.     ??? traZODone (DESYREL) 100 mg tablet Take 100 mg by mouth nightly.     ??? tamsulosin (FLOMAX) 0.4 mg capsule Take 0.4 mg by mouth daily.     ??? metFORMIN (GLUCOPHAGE) 850 mg tablet Take 500 mg by mouth daily. Indications: daily or twice a day     ??? budesonide-formoterol (SYMBICORT) 160-4.5 mcg/actuation HFAA Take as directed     ??? dicyclomine (BENTYL) 20 mg tablet Take 20 mg by mouth every six (6) hours. As needed     ??? losartan (COZAAR) 25 mg tablet Take 50 mg by mouth daily.     ??? apixaban (ELIQUIS) 5 mg tablet Take 5 mg by mouth two (2) times a day.     ??? atorvastatin (LIPITOR) 40 mg tablet Take 40 mg by mouth daily.     ??? aspirin delayed-release 81 mg tablet Take 81 mg by mouth daily.     ??? Calcium-Cholecalciferol, D3, 500 mg(1,'250mg'$ ) -400 unit tab Take 1 Tab by mouth daily.     ??? aclidinium bromide (TUDORZA PRESSAIR) 400 mcg/actuation inhaler Take 1 Puff by inhalation two (2) times a day.     ??? polyethylene glycol (MIRALAX) 17 gram/dose powder take 17gms by disolved in water daily as needed for constipation     ??? ferrous sulfate 325 mg (65 mg iron) tablet Take  by mouth Daily (before breakfast).          No Known Allergies    Review of Systems:  Review of Systems - Negative except abdominal pain.          Objective:     Vitals:    08/22/17 1211 08/22/17 1336   BP: 124/65 120/71    Pulse: 90 65   Resp: 16 12   Temp: 98.1 ??F (36.7 ??C)    SpO2: 94% 94%   Weight: 86.2 kg (190 lb)    Height: '5\' 9"'$  (1.753 m)         Physical Exam:  Physical Examination: General appearance - alert, well appearing.  HEENT: NC/AT, PER, EOMI, sclera anicteric, OP clear, neck supple  Chest - clear to auscultation  Heart - irregularly irregular  Abdomen - soft, distended, diffuse tenderness, hyperactive bowel sounds  Neurological - alert, oriented, normal speech, no focal findings or movement disorder noted  Musculoskeletal - no joint tenderness, deformity or swelling  Extremities - peripheral pulses normal, no pedal edema, no clubbing or cyanosis  Skin - normal coloration and turgor, no rashes, no suspicious skin lesions noted    Recent Results (from the past 24 hour(s))   CBC WITH AUTOMATED DIFF    Collection Time: 08/22/17 12:42 PM   Result Value Ref Range    WBC 12.7 (H) 4.0 - 10.0 K/uL    RBC 4.69 4.51 - 5.93 M/uL    HGB 14.6 13.7 - 17.5 g/dL    HCT 44.2 40.1 - 51.0 %    MCV 94.2 80.0 - 95.0 FL    MCH 31.1 25.6 - 32.2 PG    MCHC 33.0 32.2 - 35.5 g/dL    RDW 15.2 (H) 11.6 - 14.4 %    PLATELET 136 (L) 150 - 400 K/uL    MPV 9.6 9.4 - 12.4 FL    NEUTROPHILS 86 %    LYMPHOCYTES 10 %  MONOCYTES 3 %    EOSINOPHILS 1 %    BASOPHILS 0 0 - 1.2 %    ABS. NEUTROPHILS 10.8 (H) 1.6 - 6.1 K/UL    ABS. LYMPHOCYTES 1.3 1.2 - 3.7 K/UL    ABS. MONOCYTES 0.3 0.2 - 0.8 K/UL    ABS. EOSINOPHILS 0.2 0.0 - 0.5 K/UL    ABS. BASOPHILS 0.0 0.0 - 0.1 K/UL    IMMATURE GRANULOCYTES 0 0.0 - 0.4 %    ABS. IMM. GRANS. 0.0 0.00 - 0.03 K/UL   LIPASE    Collection Time: 08/22/17 12:42 PM   Result Value Ref Range    Lipase 74 73 - 161 U/L   METABOLIC PANEL, COMPREHENSIVE    Collection Time: 08/22/17 12:42 PM   Result Value Ref Range    Sodium 138 136 - 145 mmol/L    Potassium 4.1 3.5 - 5.1 mmol/L    Chloride 101 98 - 110 mmol/L    CO2 30 21 - 32 mmol/L    Anion gap 7 5 - 15 mmol/L    Glucose 149 (H) 70 - 100 mg/dL    BUN 23 (H) 7 - 18 MG/DL     Creatinine 1.28 0.70 - 1.30 MG/DL    BUN/Creatinine ratio 18 12 - 20    GFR est AA >60 >60 ml/min/1.41m    GFR est non-AA 59 (L) >60 ml/min/1.762m   Calcium 10.1 8.5 - 10.1 MG/DL    Bilirubin, total 0.89 0.20 - 1.20 mg/dL    ALT (SGPT) 30 16 - 61 U/L    AST (SGOT) 13 (L) 15 - 37 U/L    Alk. phosphatase 95 45 - 117 U/L    Protein, total 6.4 6.4 - 8.2 g/dL    Albumin 3.5 3.4 - 5.0 g/dL    Globulin 2.9 2.5 - 3.5 g/dL    A-G Ratio 1.2 1.0 - 3.0     UA WITH REFLEX MICRO AND CULTURE    Collection Time: 08/22/17 12:42 PM   Result Value Ref Range    Color YELLOW YELLOW    Appearance CLEAR CLEAR    Specific gravity >1.035 (H) 1.001 - 1.035    pH (UA) 6.5 5 - 8    Protein TRACE (A) NEGATIVE mg/dL    Glucose NEGATIVE  NEGATIVE mg/dL    Ketone NEGATIVE  NEGATIVE mg/dL    Bilirubin NEGATIVE  NEGATIVE    Blood NEGATIVE  NEGATIVE    Urobilinogen 1.0 0.2 - 1.0 EU/dL    Nitrites NEGATIVE  NEGATIVE    Leukocyte Esterase NEGATIVE  NEGATIVE   PROTHROMBIN TIME + INR    Collection Time: 08/22/17 12:42 PM   Result Value Ref Range    Prothrombin time 10.5 9.7 - 11.4 sec    INR 1.0 0.7 - 1.0   EKG, 12 LEAD, INITIAL    Collection Time: 08/22/17 12:52 PM   Result Value Ref Range    Heart Rate 84 bpm    QRS Interval 122 ms    QT Interval 392 ms    QTC Interval 464 ms    P Axis  deg    QRS Axis -31 deg    T Wave Axis 74 deg    P-R Interval  msec       Assessment:   [age]ear old male with multiple medical problems including history 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  He has undergone an extensive GI work up over the recent years including multiple EGDs, colonoscopies, abdominal CTs and capsule endoscopy.  He presents to the emergency department due to acute generalized abdominal pain and distension since last evening.  Labs show a mild leukocytosis.  CT  abdomen/pelvis note 2 areas of bowel wall thickening in the mid ileum beyond the reach of a traditional scope.  Areas appear to be causing an obstruction.    Discussed the case with Dr. Berton Lan.  Findings consistent with an acute enteritis.  Etiology is not clear.  Less suspicious of crohn's as he has undergone an extensive GI work up to date without findings to support this.  Its possible the recently started ARB is causing angioedema.        Plan:   1. Recommend stopping the ARB and switching to an alternative antihypertensive  2. Recommend clear liquid diet   3. Agree with antibiotic coverage  4. Continue supportive management  5. If condition improves, will plan for repeat enterography study in 4-6 weeks to document healing    Signed By: Jeanella Craze, PA-C     August 22, 2017      This patient was seen by me in person in addition to the allied provider. The chart was reviewed, the patient examined, the plan was discussed with the patient and I agree with the above note and plan.

## 2017-08-22 NOTE — Progress Notes (Signed)
TRANSFER - IN REPORT:    Verbal report received from [place] Aragoso(name) on [employer]  being received from ED(unit) for routine progression of care      Report consisted of patient???s Situation, Background, Assessment and   Recommendations(SBAR).     Information from the following report(s) SBAR was reviewed with the receiving nurse.    Opportunity for questions and clarification was provided.      Assessment completed upon patient???s arrival to unit and care assumed.

## 2017-08-22 NOTE — ED Provider Notes (Signed)
Presents to the ED with a chief complaint of abdominal pain.  States that he has had abdominal pain ongoing now for approximately the last couple days.  Believes it started yesterday or perhaps on Saturday describes as a sharp pain across the abdomen worse on the right than the left he denies any fever chills nausea vomiting or other associated symptoms. This is never happened to him before he has no cough no chest pain no shortness of breath.             Past Medical History:   Diagnosis Date   ??? A-fib ([place])    ??? Adenomatous colon polyp     SERRATED; DUE 18/19   ??? Adenomatous duodenal polyp    ??? AVM (arteriovenous malformation)     DUODENAL   ??? CAD (coronary artery disease)     SP MI   ??? 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 COPD (chronic obstructive pulmonary disease) ([facility])     QUIT TOBACCO SMOKING 2 YRS AGO   ??? Diabetes ([place])    ??? Diverticulosis of colon    ??? Dyslipidemia    ??? Hiatal hernia    ??? History of blood transfusion 2017    FOR PROFOUND ANEMIA   ??? IBS (irritable bowel syndrome)    ??? Iron deficiency     SEVERE-INFUSIONS 5/17       Past Surgical History:   Procedure Laterality Date   ??? COLONOSCOPY N/A 05/30/2017    COLONOSCOPY performed by Valetta Fuller, MD at [place]   ??? HX COLONOSCOPY  2013    RECOMMENDED 5 YR FOLLOWUP; REPEAT 4/17-VILLOUS ADENOMA SIGMOID, TUBULAR ADENOMA OF TRANSVERSE COLON, ILEUM AND ASCENDING COLON-CD3 IMMUNOSTAIN NEG   ??? HX ENDOSCOPY      2013-GASTRIC ULCERS; 2017 GASTRITIS; ADENOMTOUS DUODENAL POLYPS, NO BLEEDING   ??? HX HEART CATHETERIZATION  2003    Stent x 1   ??? HX OTHER ARTIFICIAL OPENING      DIAGNOSTIC BRONCHOSCOPY   ??? HX OTHER SURGICAL Right 06/2014    MIDDLE LOBE RESECTION   ??? HX PTCA     ??? HX SMALL BOWEL RESECTION  1994    "STOMACH HAD ATTACHED ITSELF TO SMALL BOWEL"   ??? PR COLONOSCOPY W/BIOPSY SINGLE/MULTIPLE  05/30/2017        ??? PR COLSC FLX W/RMVL OF TUMOR POLYP LESION SNARE TQ  05/30/2017         ??? PR EGD REMOVAL TUMOR POLYP/OTHER LESION SNARE [facility]  05/30/2017        ??? PR EGD TRANSORAL BIOPSY SINGLE/MULTIPLE  05/30/2017              Family History:   Adopted: Yes   Family history unknown: Yes       Social History     Socioeconomic History   ??? Marital status: SINGLE     Spouse name: Not on file   ??? Number of children: Not on file   ??? Years of education: Not on file   ??? Highest education level: Not on file   Social Needs   ??? Financial resource strain: Not on file   ??? Food insecurity - worry: Not on file   ??? Food insecurity - inability: Not on file   ??? Transportation needs - medical: Not on file   ??? Transportation needs - non-medical: Not on file   Occupational History   ??? Occupation: Press photographer   Tobacco Use   ???  Smoking status: Former Smoker     Last attempt to quit: 06/2014     Years since quitting: 3.1   ??? Smokeless tobacco: Never Used   Substance and Sexual Activity   ??? Alcohol use: No   ??? Drug use: Not on file   ??? Sexual activity: Not on file   Other Topics Concern   ??? Not on file   Social History Narrative    PATIENT IS A FORMER SMOKER. QUIT IN OCT 2015    MARTIAL STATUS: WIDOWED. LIVES WITH GIRLFRIEND    CHILDREN: NO    OCCUPATION: WAS IN SALES         ALLERGIES: Patient has no known allergies.    Review of Systems   Constitutional: Negative for fever.   HENT: Negative for congestion.    Eyes: Negative for pain.   Respiratory: Negative for shortness of breath.    Cardiovascular: Negative for chest pain.   Gastrointestinal: Negative for abdominal pain.   Endocrine: Negative for polydipsia.   All other systems reviewed and are negative.      Vitals:    08/22/17 1211   BP: 124/65   Pulse: 90   Resp: 16   Temp: 98.1 ??F (36.7 ??C)   SpO2: 94%   Weight: 86.2 kg (190 lb)   Height: 5\' 9"  (1.753 m)            Physical Exam   Constitutional: He is oriented to person, place, and time. He appears well-developed and well-nourished.   HENT:   Head: Normocephalic and atraumatic.    Eyes: EOM are normal. Pupils are equal, round, and reactive to light.   Neck: Normal range of motion. Neck supple.   Cardiovascular: Normal rate and regular rhythm.   Pulmonary/Chest: Effort normal and breath sounds normal.   Abdominal: Soft. Bowel sounds are normal. He exhibits no abdominal bruit. There is generalized tenderness. There is no rigidity, no guarding and no CVA tenderness.   Musculoskeletal: Normal range of motion.   Neurological: He is alert and oriented to person, place, and time.   Skin: Skin is warm and dry. Capillary refill takes less than 2 seconds.   Psychiatric: He has a normal mood and affect.   Nursing note and vitals reviewed.       MDM  Number of Diagnoses or Management Options  Diagnosis management comments: Presents to the ED with a chief complaint of abdominal pain. Patient has diffuse abdominal pain.  He appears to have 2 areas of thickening on the mid ileum with luminal narrowing there are 2 areas separated by another small area.  This appears to be causing an obstructive pattern.  He will be started on Levaquin and Flagyl in the ED in the plan will be to admit him to the hospitalist service.  Currently he is hemodynamically stable he is afebrile he is nontoxic.  I think with the obstructive pattern it is prudent to admit him.  Have spoken with Dr. Berton Lan.  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 Enteritis K52.9 558.9   2. Obstruction (acquired) of bladder neck or vesicourethral orifice N32.0 596.0

## 2017-08-22 NOTE — H&P (Signed)
History & Physical     Primary Care Provider: Tally Joe, MD  Date of service: 08/22/17    History of Presenting Illness:     Paul [name] is a 71 y.o. male who presents with generalized abdominal pain with started yesterday associated with nausea.   Last bowel movement was yesterday, well-formed, no diarrhea, no blood in stools.  No vomiting. No history of fever or chills  No chest pain, lightheadedness, dizziness, palpitations.    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    Initial workup in the ED revealed, borderline leukocytosis, normal hemoglobin hematocrit.  CT scan of abdomen and pelvis reveals 2 areas of bowel wall thickening of mid ileum with luminal narrowing causing an obstructive pattern.  Patient started on antibiotics including levofloxacin and metronidazole.  Gastroenterology has been consulted    Past Medical History:   Diagnosis Date   ??? A-fib ([place])    ??? Adenomatous colon polyp     SERRATED; DUE 18/19   ??? Adenomatous duodenal polyp    ??? AVM (arteriovenous malformation)     DUODENAL   ??? CAD (coronary artery disease)     SP MI   ??? 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 COPD (chronic obstructive pulmonary disease) (HCC)     QUIT TOBACCO SMOKING 2 YRS AGO   ??? Diabetes ([place])    ??? Diverticulosis of colon    ??? Dyslipidemia    ??? Hiatal hernia    ??? History of blood transfusion 2017    FOR PROFOUND ANEMIA   ??? IBS (irritable bowel syndrome)    ??? Iron deficiency     SEVERE-INFUSIONS 5/17      Past Surgical History:   Procedure Laterality Date   ??? COLONOSCOPY N/A 05/30/2017    COLONOSCOPY performed by Valetta Fuller, MD at [facility]    ??? HX COLONOSCOPY  2013    RECOMMENDED 5 YR FOLLOWUP; REPEAT 4/17-VILLOUS ADENOMA SIGMOID, TUBULAR ADENOMA OF TRANSVERSE COLON, ILEUM AND ASCENDING COLON-CD3 IMMUNOSTAIN NEG   ??? HX ENDOSCOPY      2013-GASTRIC ULCERS; 2017 GASTRITIS; ADENOMTOUS DUODENAL POLYPS, NO BLEEDING   ??? HX HEART CATHETERIZATION  2003    Stent x 1   ??? HX OTHER ARTIFICIAL OPENING      DIAGNOSTIC BRONCHOSCOPY   ??? HX OTHER SURGICAL Right 06/2014    MIDDLE LOBE RESECTION   ??? HX PTCA     ??? HX SMALL BOWEL RESECTION  1994    "STOMACH HAD ATTACHED ITSELF TO SMALL BOWEL"   ??? PR COLONOSCOPY W/BIOPSY SINGLE/MULTIPLE  05/30/2017        ??? PR COLSC FLX W/RMVL OF TUMOR POLYP LESION SNARE TQ  05/30/2017        ??? PR EGD REMOVAL TUMOR POLYP/OTHER LESION SNARE [facility]  05/30/2017        ??? PR EGD TRANSORAL BIOPSY SINGLE/MULTIPLE  05/30/2017          Prior to Admission medications    Medication Sig Start Date End Date Taking? Authorizing Provider   metFORMIN (GLUCOPHAGE) 500 mg tablet Take  by mouth two (2) times daily (with meals).    Provider, Historical   omeprazole (PRILOSEC) 40  mg capsule Take 40 mg by mouth two (2) times a day.    Provider, Historical   allopurinol (ZYLOPRIM) 300 mg tablet Take 300 mg by mouth daily.    Provider, Historical   ALBUTEROL SULFATE PO Take 3 Mls by nebulization every 4 hours as needed for wheezing   Indications: Albuterol Sulfate (2.5 MG/3ML) 0.083% Inhalation Nebulizatio (Albuterol Sulfate)    Provider, Historical   traMADol (ULTRAM) 50 mg tablet Take 50 mg by mouth every six (6) hours as needed.    Provider, Historical   traZODone (DESYREL) 100 mg tablet Take 100 mg by mouth nightly.    Provider, Historical   tamsulosin (FLOMAX) 0.4 mg capsule Take 0.4 mg by mouth daily.    Provider, Historical   metFORMIN (GLUCOPHAGE) 850 mg tablet Take 500 mg by mouth daily. Indications: daily or twice a day    Provider, Historical   budesonide-formoterol (SYMBICORT) 160-4.5 mcg/actuation HFAA Take as directed    Provider, Historical    dicyclomine (BENTYL) 20 mg tablet Take 20 mg by mouth every six (6) hours. As needed    Provider, Historical   losartan (COZAAR) 25 mg tablet Take 50 mg by mouth daily.    Provider, Historical   apixaban (ELIQUIS) 5 mg tablet Take 5 mg by mouth two (2) times a day.    Provider, Historical   atorvastatin (LIPITOR) 40 mg tablet Take 40 mg by mouth daily.    Provider, Historical   aspirin delayed-release 81 mg tablet Take 81 mg by mouth daily.    Provider, Historical   Calcium-Cholecalciferol, D3, 500 mg(1,232m) -400 unit tab Take 1 Tab by mouth daily.    Provider, Historical   aclidinium bromide (TUDORZA PRESSAIR) 400 mcg/actuation inhaler Take 1 Puff by inhalation two (2) times a day.    Provider, Historical   polyethylene glycol (MIRALAX) 17 gram/dose powder take 17gms by disolved in water daily as needed for constipation    Provider, Historical   ferrous sulfate 325 mg (65 mg iron) tablet Take  by mouth Daily (before breakfast).    Provider, Historical     No Known Allergies   Family History   Adopted: Yes   Family history unknown: Yes      Social History     Socioeconomic History   ??? Marital status: SINGLE     Spouse name: Not on file   ??? Number of children: Not on file   ??? Years of education: Not on file   ??? Highest education level: Not on file   Social Needs   ??? Financial resource strain: Not on file   ??? Food insecurity - worry: Not on file   ??? Food insecurity - inability: Not on file   ??? Transportation needs - medical: Not on file   ??? Transportation needs - non-medical: Not on file   Occupational History   ??? Occupation: SPress photographer  Tobacco Use   ??? Smoking status: Former Smoker     Last attempt to quit: 06/2014     Years since quitting: 3.1   ??? Smokeless tobacco: Never Used   Substance and Sexual Activity   ??? Alcohol use: No   ??? Drug use: Not on file   ??? Sexual activity: Not on file   Other Topics Concern   ??? Not on file   Social History Narrative    PATIENT IS A FORMER SMOKER. QUIT IN OCT 2015     MARTIAL STATUS: WIDOWED. LIVES WITH GIRLFRIEND    CHILDREN:  NO    OCCUPATION: WAS IN SALES     No Order    REVIEW OF SYSTEMS:       _0  All systems reviewed and negative except as above in HPI      _1  Unable to obtain  ROS due to  _2 mental status change  _3 sedated   _4 intubated      Objective:     Physical Exam:     Visit Vitals  BP 120/71   Pulse 65   Temp 98.1 ??F (36.7 ??C)   Resp 12   Ht _5  (1.753 m)   Wt 86.2 kg (190 lb)   SpO2 94%   BMI 28.06 kg/m??      O2 Device: Room air     General: Alert, cooperative, no acute distress  HEENT:  Pupil equal and reactive to light, no pallor, no icterus.                  Moist mucous membranes  CVS:  Regular rate rhythm, S1-S2 normal, no murmur, gallops or rubs  Chest: Decreased A/E b/l  with bibasilar crackles. No overt respiratory distress.  Abdomen:  Soft, distended, diffuse tenderness, hyperactive bowel sounds. No rebound, guarding, rigidity.  Extremities:  No lower extremity pitting edema noted.  Distal pulse palpable bilaterally  Skin:  Warm dry, no rash   Neurologic:  Alert, cooperative.  No obvious focal motor deficits        Clinical results:    Recent Labs     08/22/17  1242   WBC 12.7*   HGB 14.6   HCT 44.2   PLT 136*     Recent Labs     08/22/17  1242   NA 138   K 4.1   CL 101   CO2 30   GLU 149*   BUN 23*   CREA 1.28   CA 10.1   ALB 3.5   TBILI 0.89   SGOT 13*   ALT 30   INR 1.0     No results for input(s): PH, PCO2, PO2, HCO3, FIO2 in the last 72 hours.  Recent Results (from the past 24 hour(s))   CBC WITH AUTOMATED DIFF    Collection Time: 08/22/17 12:42 PM   Result Value Ref Range    WBC 12.7 (H) 4.0 - 10.0 K/uL    RBC 4.69 4.51 - 5.93 M/uL    HGB 14.6 13.7 - 17.5 g/dL    HCT 44.2 40.1 - 51.0 %    MCV 94.2 80.0 - 95.0 FL    MCH 31.1 25.6 - 32.2 PG    MCHC 33.0 32.2 - 35.5 g/dL    RDW 15.2 (H) 11.6 - 14.4 %    PLATELET 136 (L) 150 - 400 K/uL    MPV 9.6 9.4 - 12.4 FL    NEUTROPHILS 86 %    LYMPHOCYTES 10 %    MONOCYTES 3 %    EOSINOPHILS 1 %     BASOPHILS 0 0 - 1.2 %    ABS. NEUTROPHILS 10.8 (H) 1.6 - 6.1 K/UL    ABS. LYMPHOCYTES 1.3 1.2 - 3.7 K/UL    ABS. MONOCYTES 0.3 0.2 - 0.8 K/UL    ABS. EOSINOPHILS 0.2 0.0 - 0.5 K/UL    ABS. BASOPHILS 0.0 0.0 - 0.1 K/UL    IMMATURE GRANULOCYTES 0 0.0 - 0.4 %    ABS. IMM. GRANS. 0.0 0.00 - 0.03 K/UL   LIPASE    Collection Time: 08/22/17 12:42  PM   Result Value Ref Range    Lipase 74 73 - 237 U/L   METABOLIC PANEL, COMPREHENSIVE    Collection Time: 08/22/17 12:42 PM   Result Value Ref Range    Sodium 138 136 - 145 mmol/L    Potassium 4.1 3.5 - 5.1 mmol/L    Chloride 101 98 - 110 mmol/L    CO2 30 21 - 32 mmol/L    Anion gap 7 5 - 15 mmol/L    Glucose 149 (H) 70 - 100 mg/dL    BUN 23 (H) 7 - 18 MG/DL    Creatinine 1.28 0.70 - 1.30 MG/DL    BUN/Creatinine ratio 18 12 - 20    GFR est AA >60 >60 ml/min/1.52m    GFR est non-AA 59 (L) >60 ml/min/1.741m   Calcium 10.1 8.5 - 10.1 MG/DL    Bilirubin, total 0.89 0.20 - 1.20 mg/dL    ALT (SGPT) 30 16 - 61 U/L    AST (SGOT) 13 (L) 15 - 37 U/L    Alk. phosphatase 95 45 - 117 U/L    Protein, total 6.4 6.4 - 8.2 g/dL    Albumin 3.5 3.4 - 5.0 g/dL    Globulin 2.9 2.5 - 3.5 g/dL    A-G Ratio 1.2 1.0 - 3.0     UA WITH REFLEX MICRO AND CULTURE    Collection Time: 08/22/17 12:42 PM   Result Value Ref Range    Color YELLOW YELLOW    Appearance CLEAR CLEAR    Specific gravity >1.035 (H) 1.001 - 1.035    pH (UA) 6.5 5 - 8    Protein TRACE (A) NEGATIVE mg/dL    Glucose NEGATIVE  NEGATIVE mg/dL    Ketone NEGATIVE  NEGATIVE mg/dL    Bilirubin NEGATIVE  NEGATIVE    Blood NEGATIVE  NEGATIVE    Urobilinogen 1.0 0.2 - 1.0 EU/dL    Nitrites NEGATIVE  NEGATIVE    Leukocyte Esterase NEGATIVE  NEGATIVE   PROTHROMBIN TIME + INR    Collection Time: 08/22/17 12:42 PM   Result Value Ref Range    Prothrombin time 10.5 9.7 - 11.4 sec    INR 1.0 0.7 - 1.0   EKG, 12 LEAD, INITIAL    Collection Time: 08/22/17 12:52 PM   Result Value Ref Range    Heart Rate 84 bpm    QRS Interval 122 ms    QT Interval 392 ms     QTC Interval 464 ms    P Axis  deg    QRS Axis -31 deg    T Wave Axis 74 deg    P-R Interval  msec   LACTIC ACID    Collection Time: 08/22/17  3:36 PM   Result Value Ref Range    Lactic acid 1.8 0.4 - 2.0 MMOL/L       EKG:  Atrial fibrillation rate controlled 84 beats per minute.   Incomplete left bundle branch block.   No acute ST T wave changes    Imaging:    CT scan of abdomen and pelvis reveals 2 areas of bowel wall thickening of mid ileum with luminal narrowing causing an obstructive pattern.     Assessment and Plan:     Patient is a pleasant [age]ear old gentleman, with multiple medical comorbidities including iron deficiency anemia, history of colonic and duodenal adenomatous polyps, remote history of bowel resection, atrial fibrillation on chronic anticoagulation, coronary artery disease non-insulin-dependent diabetes type 2 who presents with  generalized abdominal pain, since 1 day.  Initial workup reveals mild leukocytosis.  CT scan of abdomen and pelvis reveals 2 areas of bowel wall thickening in mid ileum, with luminal narrowing causing an obstructive pattern    Abdominal pain:   Patient will be treated supportively at this point with IV hydration, clear liquid diet, empiric treatment with IV antibiotics including levofloxacin and metronidazole.  Pain management, close clinical evaluation and serial abdominal examinations.  Gastroenterology has been consulted    Atrial fibrillation:   Rate controlled.   Continue telemetry monitoring.  Continue metoprolol IV with close monitoring.  Hold apixaban for now    Hypertension:   Monitor blood pressure trends, hold losartan    Diabetes type 2:   Close glucose monitoring, hold metformin for now    Code status:   Full code  Thromboprophylaxis:   Heparin s/c          Total time spent in evaluation management and admission more than 60 min.    Signed By: Raymond Gurney, MD     August 22, 2017

## 2017-08-22 NOTE — Progress Notes (Signed)
Problem: Falls - Risk of  Goal: *Absence of Falls  Document Schmid Fall Risk and appropriate interventions in the flowsheet.  Outcome: Progressing Towards Goal  Fall Risk Interventions:            Medication Interventions: Assess postural VS orthostatic hypotension, Patient to call before getting OOB, Teach patient to arise slowly

## 2017-08-22 NOTE — ED Notes (Signed)
TRANSFER - OUT REPORT:    Verbal report given to Josephina Shih RN (name) on Paul [name]  being transferred to [place] (unit) for routine progression of care       Report consisted of patient???s Situation, Background, Assessment and   Recommendations(SBAR).     Information from the following report(s) SBAR was reviewed with the receiving nurse.    Lines:       Opportunity for questions and clarification was provided.      Patient transported with:   [employer]

## 2017-08-23 LAB — GLUCOSE, POC
Glucose (POC): 144 mg/dL — ABNORMAL HIGH (ref 70–105)
Glucose (POC): 143 mg/dL — ABNORMAL HIGH (ref 70–105)

## 2017-08-23 LAB — METABOLIC PANEL, BASIC
Anion gap: 6 mmol/L (ref 5–15)
BUN/Creatinine ratio: 16 (ref 12–20)
BUN: 19 MG/DL — ABNORMAL HIGH (ref 7–18)
CO2: 30 mmol/L (ref 21–32)
Calcium: 8.6 MG/DL (ref 8.5–10.1)
Chloride: 103 mmol/L (ref 98–110)
Creatinine: 1.17 MG/DL (ref 0.70–1.30)
GFR est AA: >60 mL/min/{1.73_m2} (ref >60)
GFR est non-AA: >60 mL/min/{1.73_m2} (ref >60)
Glucose: 143 mg/dL — ABNORMAL HIGH (ref 70–100)
Potassium: 4 mmol/L (ref 3.5–5.1)
Sodium: 139 mmol/L (ref 136–145)

## 2017-08-23 LAB — CBC WITH AUTOMATED DIFF
ABS. BASOPHILS: 0 10*3/uL (ref 0.0–0.1)
ABS. EOSINOPHILS: 0.2 10*3/uL (ref 0.0–0.5)
ABS. IMM. GRANS.: 0 10*3/uL (ref 0–0.03)
ABS. LYMPHOCYTES: 0.9 10*3/uL — ABNORMAL LOW (ref 1.2–3.7)
ABS. MONOCYTES: 0.6 10*3/uL (ref 0.2–0.8)
ABS. NEUTROPHILS: 5.9 10*3/uL (ref 1.6–6.1)
BASOPHILS: 0 % (ref 0–1.2)
EOSINOPHILS: 2 %
HCT: 42.7 % (ref 40.1–51.0)
HGB: 14.1 g/dL (ref 13.7–17.5)
IMMATURE GRANULOCYTES: 0 % (ref 0–0.4)
LYMPHOCYTES: 12 %
MCH: 31.8 PG (ref 25.6–32.2)
MCHC: 33 g/dL (ref 32.2–35.5)
MCV: 96.4 FL — ABNORMAL HIGH (ref 80.0–95.0)
MONOCYTES: 7 %
MPV: 10.2 FL (ref 9.4–12.4)
NEUTROPHILS: 79 %
PLATELET: 114 10*3/uL — ABNORMAL LOW (ref 150–400)
RBC: 4.43 M/uL — ABNORMAL LOW (ref 4.51–5.93)
RDW: 15.3 % — ABNORMAL HIGH (ref 11.6–14.4)
WBC: 7.6 10*3/uL (ref 4.0–10.0)

## 2017-08-23 LAB — MAGNESIUM: Magnesium: 1.6 mg/dL (ref 1.6–2.6)

## 2017-08-23 MED ORDER — ALBUTEROL SULFATE 0.083 % (0.83 MG/ML) SOLN FOR INHALATION
2.5 mg /3 mL (0.083 %) | RESPIRATORY_TRACT | Status: DC | PRN
Start: 2017-08-23 — End: 2017-08-24

## 2017-08-23 MED ORDER — IPRATROPIUM-ALBUTEROL 2.5 MG-0.5 MG/3 ML NEB SOLUTION
2.5 mg-0.5 mg/3 ml | Freq: Four times a day (QID) | RESPIRATORY_TRACT | Status: DC
Start: 2017-08-23 — End: 2017-08-24
  Administered 2017-08-23 – 2017-08-24 (×5): via RESPIRATORY_TRACT

## 2017-08-23 MED ORDER — METOPROLOL TARTRATE 25 MG TAB
25 mg | Freq: Two times a day (BID) | ORAL | Status: DC
Start: 2017-08-23 — End: 2017-08-24
  Administered 2017-08-23 – 2017-08-24 (×3): via ORAL

## 2017-08-23 MED ORDER — MAGNESIUM SULFATE 2 GRAM/50 ML IVPB
2 gram/50 mL (4 %) | Freq: Once | INTRAVENOUS | Status: AC
Start: 2017-08-23 — End: 2017-08-23
  Administered 2017-08-23: 18:00:00 via INTRAVENOUS

## 2017-08-23 MED ORDER — TRAZODONE 100 MG TAB
100 mg | Freq: Every evening | ORAL | Status: DC
Start: 2017-08-23 — End: 2017-08-24
  Administered 2017-08-23 – 2017-08-24 (×2): via ORAL

## 2017-08-23 MED ORDER — APIXABAN 5 MG TABLET
5 mg | Freq: Two times a day (BID) | ORAL | Status: DC
Start: 2017-08-23 — End: 2017-08-24
  Administered 2017-08-24: 15:00:00 via ORAL

## 2017-08-23 MED ORDER — FLU VACCINE TV 2018-19 (65 YRS+)(PF) 180 MCG (60 MCG X 3)/0.5 ML IM SYRINGE
180 mcg/0.5 mL | INTRAMUSCULAR | Status: AC
Start: 2017-08-23 — End: 2017-08-23
  Administered 2017-08-23: 21:00:00 via INTRAMUSCULAR

## 2017-08-23 MED FILL — MORPHINE 2 MG/ML INJECTION: 2 mg/mL | INTRAMUSCULAR | Qty: 1

## 2017-08-23 MED FILL — METOPROLOL TARTRATE 25 MG TAB: 25 mg | ORAL | Qty: 1

## 2017-08-23 MED FILL — FLUZONE HIGH-DOSE 2018-2019 (PF) 180 MCG/0.5 ML INTRAMUSCULAR SYRINGE: 180 mcg/0.5 mL | INTRAMUSCULAR | Qty: 0.5

## 2017-08-23 MED FILL — ALBUTEROL SULFATE 0.083 % (0.83 MG/ML) SOLN FOR INHALATION: 2.5 mg /3 mL (0.083 %) | RESPIRATORY_TRACT | Qty: 1

## 2017-08-23 MED FILL — METRONIDAZOLE IN SODIUM CHLORIDE (ISO-OSM) 500 MG/100 ML IV PIGGY BACK: 500 mg/100 mL | INTRAVENOUS | Qty: 100

## 2017-08-23 MED FILL — METOPROLOL TARTRATE 5 MG/5 ML IV SOLN: 5 mg/ mL | INTRAVENOUS | Qty: 5

## 2017-08-23 MED FILL — HEPARIN (PORCINE) 5,000 UNIT/ML IJ SOLN: 5000 unit/mL | INTRAMUSCULAR | Qty: 1

## 2017-08-23 MED FILL — IPRATROPIUM-ALBUTEROL 2.5 MG-0.5 MG/3 ML NEB SOLUTION: 2.5 mg-0.5 mg/3 ml | RESPIRATORY_TRACT | Qty: 3

## 2017-08-23 MED FILL — MAGNESIUM SULFATE 2 GRAM/50 ML IVPB: 2 gram/50 mL (4 %) | INTRAVENOUS | Qty: 50

## 2017-08-23 MED FILL — DULERA 200 MCG-5 MCG/ACTUATION HFA AEROSOL INHALER: 200-5 mcg/actuation | RESPIRATORY_TRACT | Qty: 8.8

## 2017-08-23 MED FILL — LEVOFLOXACIN IN D5W 500 MG/100 ML IV PIGGY BACK: 500 mg/100 mL | INTRAVENOUS | Qty: 100

## 2017-08-23 MED FILL — TRAZODONE 100 MG TAB: 100 mg | ORAL | Qty: 1

## 2017-08-23 NOTE — Progress Notes (Signed)
Problem: Falls - Risk of  Goal: *Absence of Falls  Document Schmid Fall Risk and appropriate interventions in the flowsheet.  Outcome: Progressing Towards Goal  Fall Risk Interventions:            Medication Interventions: Bed/chair exit alarm, Teach patient to arise slowly, Assess postural VS orthostatic hypotension

## 2017-08-23 NOTE — Progress Notes (Addendum)
Gastrointestinal Progress Note    08/23/2017    Admit Date: 08/22/2017    Subjective:   Feeling better.   Minimal pain.  Denies nausea/vomiting.  Passing a lot of gas.  No BM yet.  Tolerating clear liquids.      Current Facility-Administered Medications   Medication Dose Route Frequency   ??? traZODone (DESYREL) tablet 100 mg  100 mg Oral QHS   ??? sodium chloride (NS) flush 3-10 mL  3-10 mL IntraVENous Q12H   ??? sodium chloride (NS) flush 3-10 mL  3-10 mL IntraVENous PRN   ??? naloxone (NARCAN) injection 0.4 mg  0.4 mg IntraVENous PRN   ??? morphine injection 2 mg  2 mg IntraVENous Q6H PRN   ??? ondansetron (ZOFRAN) injection 4 mg  4 mg IntraVENous Q6H PRN    Or   ??? ondansetron (ZOFRAN ODT) tablet 4 mg  4 mg Oral Q6H PRN   ??? heparin (porcine) injection 5,000 Units  5,000 Units SubCUTAneous Q8H   ??? levoFLOXacin (LEVAQUIN) 500 mg in D5W IVPB  500 mg IntraVENous Q24H   ??? 0.9% sodium chloride infusion  75 mL/hr IntraVENous CONTINUOUS   ??? glucose chewable tablet 16 g  4 Tab Oral PRN   ??? dextrose 40% (GLUTOSE) oral gel 1 Tube  1 Tube Oral PRN   ??? glucagon (GLUCAGEN) injection 1 mg  1 mg IntraMUSCular PRN   ??? dextrose 10% infusion 250 mL  250 mL IntraVENous PRN   ??? metroNIDAZOLE (FLAGYL) IVPB premix 500 mg  500 mg IntraVENous Q8H   ??? metoprolol (LOPRESSOR) injection 2.5 mg  2.5 mg IntraVENous Q8H   ??? metoprolol (LOPRESSOR) injection 2.5 mg  2.5 mg IntraVENous Q8H PRN   ??? mometasone-formoterol (DULERA) 230mg-5mcg/puff  2 Puff Inhalation BID RT   ??? influenza vaccine 2018-19 (65 yrs+)(PF) (FLUZONE HIGH-DOSE) injection 0.5 mL  0.5 mL IntraMUSCular PRIOR TO DISCHARGE   ??? albuterol (PROVENTIL VENTOLIN) nebulizer solution 2.5 mg  2.5 mg Nebulization Q4H PRN   ??? albuterol-ipratropium (DUO-NEB) 2.5 MG-0.5 MG/3 ML  3 mL Nebulization QID RT        Objective:     Blood pressure 122/70, pulse 79, temperature 97.8 ??F (36.6 ??C), resp. rate 16, height '5\' 9"'$  (1.753 m), weight 88.6 kg (195 lb 5.2 oz), SpO2 96 %.    No intake/output data recorded.     12/09 1901 - 12/11 0700  In: 1250 [I.V.:1250]  Out: -     EXAM:   General: Appears comfortable. A&Ox3  Cardio: RRR  Pulm: Clear  Abd: Hyperactive BS, less distended, soft, mild tenderness periumbilically.     Data Review      Recent Results (from the past 24 hour(s))   CBC WITH AUTOMATED DIFF    Collection Time: 08/22/17 12:42 PM   Result Value Ref Range    WBC 12.7 (H) 4.0 - 10.0 K/uL    RBC 4.69 4.51 - 5.93 M/uL    HGB 14.6 13.7 - 17.5 g/dL    HCT 44.2 40.1 - 51.0 %    MCV 94.2 80.0 - 95.0 FL    MCH 31.1 25.6 - 32.2 PG    MCHC 33.0 32.2 - 35.5 g/dL    RDW 15.2 (H) 11.6 - 14.4 %    PLATELET 136 (L) 150 - 400 K/uL    MPV 9.6 9.4 - 12.4 FL    NEUTROPHILS 86 %    LYMPHOCYTES 10 %    MONOCYTES 3 %    EOSINOPHILS 1 %  BASOPHILS 0 0 - 1.2 %    ABS. NEUTROPHILS 10.8 (H) 1.6 - 6.1 K/UL    ABS. LYMPHOCYTES 1.3 1.2 - 3.7 K/UL    ABS. MONOCYTES 0.3 0.2 - 0.8 K/UL    ABS. EOSINOPHILS 0.2 0.0 - 0.5 K/UL    ABS. BASOPHILS 0.0 0.0 - 0.1 K/UL    IMMATURE GRANULOCYTES 0 0.0 - 0.4 %    ABS. IMM. GRANS. 0.0 0.00 - 0.03 K/UL   LIPASE    Collection Time: 08/22/17 12:42 PM   Result Value Ref Range    Lipase 74 73 - 932 U/L   METABOLIC PANEL, COMPREHENSIVE    Collection Time: 08/22/17 12:42 PM   Result Value Ref Range    Sodium 138 136 - 145 mmol/L    Potassium 4.1 3.5 - 5.1 mmol/L    Chloride 101 98 - 110 mmol/L    CO2 30 21 - 32 mmol/L    Anion gap 7 5 - 15 mmol/L    Glucose 149 (H) 70 - 100 mg/dL    BUN 23 (H) 7 - 18 MG/DL    Creatinine 1.28 0.70 - 1.30 MG/DL    BUN/Creatinine ratio 18 12 - 20    GFR est AA >60 >60 ml/min/1.32m    GFR est non-AA 59 (L) >60 ml/min/1.718m   Calcium 10.1 8.5 - 10.1 MG/DL    Bilirubin, total 0.89 0.20 - 1.20 mg/dL    ALT (SGPT) 30 16 - 61 U/L    AST (SGOT) 13 (L) 15 - 37 U/L    Alk. phosphatase 95 45 - 117 U/L    Protein, total 6.4 6.4 - 8.2 g/dL    Albumin 3.5 3.4 - 5.0 g/dL    Globulin 2.9 2.5 - 3.5 g/dL    A-G Ratio 1.2 1.0 - 3.0     UA WITH REFLEX MICRO AND CULTURE     Collection Time: 08/22/17 12:42 PM   Result Value Ref Range    Color YELLOW YELLOW    Appearance CLEAR CLEAR    Specific gravity >1.035 (H) 1.001 - 1.035    pH (UA) 6.5 5 - 8    Protein TRACE (A) NEGATIVE mg/dL    Glucose NEGATIVE  NEGATIVE mg/dL    Ketone NEGATIVE  NEGATIVE mg/dL    Bilirubin NEGATIVE  NEGATIVE    Blood NEGATIVE  NEGATIVE    Urobilinogen 1.0 0.2 - 1.0 EU/dL    Nitrites NEGATIVE  NEGATIVE    Leukocyte Esterase NEGATIVE  NEGATIVE   PROTHROMBIN TIME + INR    Collection Time: 08/22/17 12:42 PM   Result Value Ref Range    Prothrombin time 10.5 9.7 - 11.4 sec    INR 1.0 0.7 - 1.0   EKG, 12 LEAD, INITIAL    Collection Time: 08/22/17 12:52 PM   Result Value Ref Range    Heart Rate 84 bpm    QRS Interval 122 ms    QT Interval 392 ms    QTC Interval 464 ms    P Axis  deg    QRS Axis -31 deg    T Wave Axis 74 deg    P-R Interval  msec   LACTIC ACID    Collection Time: 08/22/17  3:36 PM   Result Value Ref Range    Lactic acid 1.8 0.4 - 2.0 MMOL/L   GLUCOSE, POC    Collection Time: 08/22/17 10:21 PM   Result Value Ref Range    Glucose (POC) 144 (H)  70 - 105 mg/dL    Performed by Josephina Shih    GLUCOSE, POC    Collection Time: 08/23/17  5:47 AM   Result Value Ref Range    Glucose (POC) 143 (H) 70 - 105 mg/dL    Performed by [place], BASIC    Collection Time: 08/23/17  6:00 AM   Result Value Ref Range    Sodium 139 136 - 145 mmol/L    Potassium 4.0 3.5 - 5.1 mmol/L    Chloride 103 98 - 110 mmol/L    CO2 30 21 - 32 mmol/L    Anion gap 6 5 - 15 mmol/L    Glucose 143 (H) 70 - 100 mg/dL    BUN 19 (H) 7 - 18 MG/DL    Creatinine 1.17 0.70 - 1.30 MG/DL    BUN/Creatinine ratio 16 12 - 20    GFR est AA >60 >60 ml/min/1.61m    GFR est non-AA >60 >60 ml/min/1.768m   Calcium 8.6 8.5 - 10.1 MG/DL   CBC WITH AUTOMATED DIFF    Collection Time: 08/23/17  6:00 AM   Result Value Ref Range    WBC 7.6 4.0 - 10.0 K/uL    RBC 4.43 (L) 4.51 - 5.93 M/uL    HGB 14.1 13.7 - 17.5 g/dL    HCT 42.7 40.1 - 51.0 %     MCV 96.4 (H) 80.0 - 95.0 FL    MCH 31.8 25.6 - 32.2 PG    MCHC 33.0 32.2 - 35.5 g/dL    RDW 15.3 (H) 11.6 - 14.4 %    PLATELET 114 (L) 150 - 400 K/uL    MPV 10.2 9.4 - 12.4 FL    NEUTROPHILS 79 %    LYMPHOCYTES 12 %    MONOCYTES 7 %    EOSINOPHILS 2 %    BASOPHILS 0 0 - 1.2 %    ABS. NEUTROPHILS 5.9 1.6 - 6.1 K/UL    ABS. LYMPHOCYTES 0.9 (L) 1.2 - 3.7 K/UL    ABS. MONOCYTES 0.6 0.2 - 0.8 K/UL    ABS. EOSINOPHILS 0.2 0.0 - 0.5 K/UL    ABS. BASOPHILS 0.0 0.0 - 0.1 K/UL    IMMATURE GRANULOCYTES 0 0 - 0.4 %    ABS. IMM. GRANS. 0.0 0 - 0.03 K/UL   MAGNESIUM    Collection Time: 08/23/17  6:00 AM   Result Value Ref Range    Magnesium 1.6 1.6 - 2.6 mg/dL         Assessment:   Acute enteritis with partial small bowel obstruction:   Differential includes acute infectious enteritis versus Angioedema related to ARB, has been discontinued.  Shows clinical improvement.  Less likely inflammatory bowel disease.    Plan:   1. Continue supportive management  2. Will advance diet to low fiber as tolerated  3.  Plan is for outpatient CT or MRI enterography in 4-6 weeks.  Complete 7 days antibiotics.    Discussed with Dr. GuBerton Lan  This patient was seen by me in person in addition to the allied provider. The chart was reviewed, the patient examined, the plan was discussed with the patient and I agree with the above note and plan.

## 2017-08-23 NOTE — Progress Notes (Signed)
Patien was glad for healing prayer.  He was having a breathing treatment so did not talk long.

## 2017-08-23 NOTE — Progress Notes (Signed)
2100: DH to Dr Verneda Skill- 38- [name], Paul- pt c/o rash to chest- very small, red, raised area. states it itches. He said he has had it since he came into the hospital and "no one has done anything about it"... he would like some Benadryl.     Orders received for 25mg  benadryl PO x1.    Pt medicated with benadryl at 2140. Trazodone held at this time d/t benadryl admin, pt will call if he is unable to sleep. Will cont to monitor.

## 2017-08-23 NOTE — Progress Notes (Signed)
Hospitalist Progress Note         Raymond Gurney, MD    Date of Service: 08/23/2017    Overnight events reviewed- no acute overnight events.   Diet escalated to clear liquids    Review of Systems:   The patient is without any acute complaints at this time.   Abdominal pain has significantly improved.  No nausea, vomiting, diarrhea.  No fever or chills  Objective:   Physical Exam:   Vitals:  Visit Vitals  BP 122/70   Pulse 79   Temp 97.8 ??F (36.6 ??C)   Resp 16   Ht 5\' 9"  (1.753 m)   Wt 88.6 kg (195 lb 5.2 oz)   SpO2 96%   BMI 28.84 kg/m??     Temp (24hrs), Avg:97.4 ??F (36.3 ??C), Min:95.7 ??F (35.4 ??C), Max:98.1 ??F (36.7 ??C)    Weight change:     Last 24hr Input/Output:    Intake/Output Summary (Last 24 hours) at 08/23/2017 1138  Last data filed at 08/22/2017 1912  Gross per 24 hour   Intake 1250 ml   Output ???   Net 1250 ml        General: Alert, cooperative, no acute distress  HEENT:  Pupil equal and reactive to light, no pallor, no icterus.                  Moist mucous membranes  CVS:  Regular rate rhythm, S1-S2 normal, no murmur, gallops or rubs  Chest: Decreased A/E b/l  with bibasilar crackles. No overt respiratory distress.  Abdomen:  Soft, mild generalized tenderness.  No rebound, guarding, rigidity.  Bowel sounds normoactive.  Extremities:  No lower extremity pitting edema noted.  Distal pulse palpable bilaterally  Skin:  Warm dry, no rash   Neurologic:  Alert, cooperative.  No obvious focal motor deficits    Clinical results:       Lab Data Reviewed:  No components found for: [facility]  Recent Labs     08/23/17  0600 08/22/17  1242   NA 139 138   K 4.0 4.1   CL 103 101   CO2 30 30   BUN 19* 23*   CREA 1.17 1.28   GLU 143* 149*   CA 8.6 10.1   ALB  --  3.5   WBC 7.6 12.7*   HGB 14.1 14.6   HCT 42.7 44.2   PLT 114* 136*       Recent Results (from the past 12 hour(s))   GLUCOSE, POC    Collection Time: 08/23/17  5:47 AM   Result Value Ref Range    Glucose (POC) 143 (H) 70 - 105 mg/dL    Performed by Josephina Shih     METABOLIC PANEL, BASIC    Collection Time: 08/23/17  6:00 AM   Result Value Ref Range    Sodium 139 136 - 145 mmol/L    Potassium 4.0 3.5 - 5.1 mmol/L    Chloride 103 98 - 110 mmol/L    CO2 30 21 - 32 mmol/L    Anion gap 6 5 - 15 mmol/L    Glucose 143 (H) 70 - 100 mg/dL    BUN 19 (H) 7 - 18 MG/DL    Creatinine 1.17 0.70 - 1.30 MG/DL    BUN/Creatinine ratio 16 12 - 20    GFR est AA >60 >60 ml/min/1.37m2    GFR est non-AA >60 >60 ml/min/1.48m2    Calcium 8.6 8.5 - 10.1 MG/DL  CBC WITH AUTOMATED DIFF    Collection Time: 08/23/17  6:00 AM   Result Value Ref Range    WBC 7.6 4.0 - 10.0 K/uL    RBC 4.43 (L) 4.51 - 5.93 M/uL    HGB 14.1 13.7 - 17.5 g/dL    HCT 42.7 40.1 - 51.0 %    MCV 96.4 (H) 80.0 - 95.0 FL    MCH 31.8 25.6 - 32.2 PG    MCHC 33.0 32.2 - 35.5 g/dL    RDW 15.3 (H) 11.6 - 14.4 %    PLATELET 114 (L) 150 - 400 K/uL    MPV 10.2 9.4 - 12.4 FL    NEUTROPHILS 79 %    LYMPHOCYTES 12 %    MONOCYTES 7 %    EOSINOPHILS 2 %    BASOPHILS 0 0 - 1.2 %    ABS. NEUTROPHILS 5.9 1.6 - 6.1 K/UL    ABS. LYMPHOCYTES 0.9 (L) 1.2 - 3.7 K/UL    ABS. MONOCYTES 0.6 0.2 - 0.8 K/UL    ABS. EOSINOPHILS 0.2 0.0 - 0.5 K/UL    ABS. BASOPHILS 0.0 0.0 - 0.1 K/UL    IMMATURE GRANULOCYTES 0 0 - 0.4 %    ABS. IMM. GRANS. 0.0 0 - 0.03 K/UL   MAGNESIUM    Collection Time: 08/23/17  6:00 AM   Result Value Ref Range    Magnesium 1.6 1.6 - 2.6 mg/dL       All Micro Results     None          Assessment/Plan:     Patient is a pleasant 72 year old gentleman, with multiple medical comorbidities including iron deficiency anemia, history of colonic and duodenal adenomatous polyps, remote history of bowel resection, atrial fibrillation on chronic anticoagulation, coronary artery disease non-insulin-dependent diabetes type 2 who presents with generalized abdominal pain, since 1 day.  Initial workup reveals mild leukocytosis.  CT scan of abdomen and pelvis reveals 2 areas of bowel wall thickening in  mid ileum, with luminal narrowing causing an obstructive pattern  ??  Abdominal pain:   Patient will be treated supportively at this point with IV hydration, escalate diet as tolerates, empiric treatment with IV antibiotics including levofloxacin and metronidazole.    Abdominal pain seems to be gradually improving.  Monitor.    ??  Atrial fibrillation:   Rate controlled.   Continue telemetry monitoring.    Resume beta-blockers.  Resume apixaban.    ??  Hypertension:   Monitor blood pressure trends, hold losartan  ??  Diabetes type 2:   Close glucose monitoring, hold metformin for now, in setting of mild renal impairment which is gradually improving.    ??  Code status:   Full code  Thromboprophylaxis:   Heparin s/c      Disposition likely discharge in next 24 hr.    Risk of deterioration:  [] Low    [] Moderate  [] High    Care Plan discussed with:    [x] Patient   [] Family    [] Care Manager    [x] Nursing   [] Consultant/Specialist :    Disposition:  [x] Home w/ Family   [] HH PT,OT,RN   [] SNF/LTC   [] Rehab    Total time spent with patient: 30 minutes.    Raymond Gurney, MD  08/23/2017      * Medications reviewed  Current Facility-Administered Medications   Medication Dose Route Frequency   ??? traZODone (DESYREL) tablet 100 mg  100 mg Oral QHS   ??? metoprolol tartrate (LOPRESSOR)  tablet 12.5 mg  12.5 mg Oral Q12H   ??? magnesium sulfate 2 g/50 ml IVPB (premix or compounded)  2 g IntraVENous ONCE   ??? sodium chloride (NS) flush 3-10 mL  3-10 mL IntraVENous Q12H   ??? sodium chloride (NS) flush 3-10 mL  3-10 mL IntraVENous PRN   ??? naloxone (NARCAN) injection 0.4 mg  0.4 mg IntraVENous PRN   ??? morphine injection 2 mg  2 mg IntraVENous Q6H PRN   ??? ondansetron (ZOFRAN) injection 4 mg  4 mg IntraVENous Q6H PRN    Or   ??? ondansetron (ZOFRAN ODT) tablet 4 mg  4 mg Oral Q6H PRN   ??? heparin (porcine) injection 5,000 Units  5,000 Units SubCUTAneous Q8H   ??? levoFLOXacin (LEVAQUIN) 500 mg in D5W IVPB  500 mg IntraVENous Q24H    ??? 0.9% sodium chloride infusion  75 mL/hr IntraVENous CONTINUOUS   ??? glucose chewable tablet 16 g  4 Tab Oral PRN   ??? dextrose 40% (GLUTOSE) oral gel 1 Tube  1 Tube Oral PRN   ??? glucagon (GLUCAGEN) injection 1 mg  1 mg IntraMUSCular PRN   ??? dextrose 10% infusion 250 mL  250 mL IntraVENous PRN   ??? metroNIDAZOLE (FLAGYL) IVPB premix 500 mg  500 mg IntraVENous Q8H   ??? metoprolol (LOPRESSOR) injection 2.5 mg  2.5 mg IntraVENous Q8H PRN   ??? mometasone-formoterol (DULERA) 246mcg-5mcg/puff  2 Puff Inhalation BID RT   ??? influenza vaccine 2018-19 (65 yrs+)(PF) (FLUZONE HIGH-DOSE) injection 0.5 mL  0.5 mL IntraMUSCular PRIOR TO DISCHARGE   ??? albuterol (PROVENTIL VENTOLIN) nebulizer solution 2.5 mg  2.5 mg Nebulization Q4H PRN   ??? albuterol-ipratropium (DUO-NEB) 2.5 MG-0.5 MG/3 ML  3 mL Nebulization QID RT

## 2017-08-23 NOTE — Progress Notes (Addendum)
Met with patient.  He was independent PTA.  He lives alone.  He has a girlfriend Vicente Males)  who he lives with part time.  Patient will be going to his girlfriend's home on discharge.  No needs.  Anticipated discharge is 08/24/17 if patient is able to tolerate advancing of his diet.

## 2017-08-24 LAB — METABOLIC PANEL, BASIC
Anion gap: 5 mmol/L (ref 5–15)
BUN/Creatinine ratio: 15 (ref 12–20)
BUN: 17 MG/DL (ref 7–18)
CO2: 28 mmol/L (ref 21–32)
Calcium: 8.6 MG/DL (ref 8.5–10.1)
Chloride: 106 mmol/L (ref 98–110)
Creatinine: 1.17 MG/DL (ref 0.70–1.30)
GFR est AA: >60 mL/min/{1.73_m2} (ref >60)
GFR est non-AA: >60 mL/min/{1.73_m2} (ref >60)
Glucose: 172 mg/dL — ABNORMAL HIGH (ref 70–100)
Potassium: 4.1 mmol/L (ref 3.5–5.1)
Sodium: 139 mmol/L (ref 136–145)

## 2017-08-24 LAB — GLUCOSE, POC: Glucose (POC): 162 mg/dL — ABNORMAL HIGH (ref 70–105)

## 2017-08-24 MED ORDER — DIPHENHYDRAMINE 25 MG TAB
25 mg | Freq: Once | ORAL | Status: AC
Start: 2017-08-24 — End: 2017-08-23
  Administered 2017-08-24: 03:00:00 via ORAL

## 2017-08-24 MED ORDER — DIPHENHYDRAMINE 25 MG TAB
25 mg | ORAL | Status: AC
Start: 2017-08-24 — End: 2017-08-24
  Administered 2017-08-24: 18:00:00 via ORAL

## 2017-08-24 MED ORDER — METRONIDAZOLE 500 MG TAB
500 mg | ORAL_TABLET | Freq: Three times a day (TID) | ORAL | 0 refills | Status: AC
Start: 2017-08-24 — End: 2017-08-31

## 2017-08-24 MED ORDER — LEVOFLOXACIN 500 MG TAB
500 mg | ORAL_TABLET | Freq: Every day | ORAL | 0 refills | Status: AC
Start: 2017-08-24 — End: 2017-08-29

## 2017-08-24 MED ORDER — METOPROLOL SUCCINATE SR 25 MG 24 HR TAB
25 mg | ORAL_TABLET | Freq: Every day | ORAL | 1 refills | Status: AC
Start: 2017-08-24 — End: ?

## 2017-08-24 MED FILL — TRAZODONE 100 MG TAB: 100 mg | ORAL | Qty: 1

## 2017-08-24 MED FILL — METRONIDAZOLE IN SODIUM CHLORIDE (ISO-OSM) 500 MG/100 ML IV PIGGY BACK: 500 mg/100 mL | INTRAVENOUS | Qty: 100

## 2017-08-24 MED FILL — ELIQUIS 5 MG TABLET: 5 mg | ORAL | Qty: 1

## 2017-08-24 MED FILL — LEVOFLOXACIN IN D5W 500 MG/100 ML IV PIGGY BACK: 500 mg/100 mL | INTRAVENOUS | Qty: 100

## 2017-08-24 MED FILL — METOPROLOL TARTRATE 25 MG TAB: 25 mg | ORAL | Qty: 1

## 2017-08-24 MED FILL — DIPHENHIST 25 MG TABLET: 25 mg | ORAL | Qty: 1

## 2017-08-24 NOTE — Progress Notes (Signed)
Patient stated "no need for appeal " after important message explained to patients. No other needs identified and patient awaiting discharge today.

## 2017-08-24 NOTE — Discharge Summary (Signed)
Hospitalist Discharge Summary  ??  ??  Name:  Paul [name]     DOB:  [birth year]    MRM:  [id number]  ????  Admit Date: 08/22/2017   ??  Discharge Date:   08/24/2017  ??  Primary Care Provider: Tally Joe, MD  ??  ??  Consultations:   Dr. Dorothea Ogle    Procedures:   None    Discharge Diagnosis:  Diffuse abdominal pain  Suspect acute enteritis with partial small bowel obstruction  Atrial fibrillation rate controlled  Secondary Diagnosis:   ??  Past Medical History:   Diagnosis Date   ??? A-fib ([place])    ??? Adenomatous colon polyp     SERRATED; DUE 18/19   ??? Adenomatous duodenal polyp    ??? AVM (arteriovenous malformation)     DUODENAL   ??? CAD (coronary artery disease)     SP MI   ??? 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 COPD (chronic obstructive pulmonary disease) (HCC)     QUIT TOBACCO SMOKING 2 YRS AGO   ??? Diabetes ([place])    ??? Diverticulosis of colon    ??? Dyslipidemia    ??? Hiatal hernia    ??? History of blood transfusion 2017    FOR PROFOUND ANEMIA   ??? IBS (irritable bowel syndrome)    ??? Iron deficiency     SEVERE-INFUSIONS 5/17      ??  History of the Present Illness:   Patient is a pleasant 71 year old gentleman, with multiple medical comorbidities including iron deficiency anemia, history of colonic and duodenal adenomatous polyps, remote history of bowel resection, atrial fibrillation on chronic anticoagulation, coronary artery disease non-insulin-dependent diabetes type 2 who presents with generalized abdominal pain, since 1 day. ??Initial workup reveals mild leukocytosis. ??CT scan of abdomen and pelvis reveals 2 areas of bowel wall thickening in mid ileum, with luminal narrowing causing an obstructive pattern    Hospital Course and Summary:  He was treated supportively with IV hydration, diet was gradually escalated as he tolerated and did well.  He had a bowel movement yesterday with no evidence of blood or loose stools.  He was empirically treated with IV antibiotics including levofloxacin and  metronidazole which has been changed to oral at the time of discharge to complete a full 7 day course.  Patient will need a close follow-up with Gastroenterology in 4-6 weeks for possible outpatient CT or MRI enterography.    For hypertension patient had been on losartan which has been discontinued in setting of mild renal impairment as well as remote possibility of angioedema however less likely.    Atrial fibrillation- rate controlled.  On review of medication, it seems like patient is not on any beta blockers.  Metoprolol XL 25 mg daily has been initiated    Advised to continue apixaban.  Advised to follow up with his Cardiology/primary care physician for blood pressure monitoring as well as medication reconciliation.    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  The time of discharge creatinine is 1.17, baseline.      ??  Vitals:??   ??  Patient Vitals for the past 24 hrs:   Temp Pulse Resp BP SpO2   08/24/17 1136 ??? ??? ??? ??? 95 %   08/24/17 1004 ??? 66 ??? 120/70 ???   08/24/17 0504 98.1 ??F (36.7 ??C) 62 24 128/58 ???   08/23/17 2140 ??? 70 ??? 110/68 ???  08/23/17 2100 ??? ??? ??? ??? 96 %   08/23/17 2007 ??? ??? ??? ??? 95 %   08/23/17 1517 ??? ??? ??? ??? 95 %   08/23/17 1300 98.4 ??F (36.9 ??C) 84 16 133/68 ???   08/23/17 1256 ??? 79 ??? 133/69 ???     ??  ??  Physical Exam:??   ??  PHYSICAL EXAM:   General: Alert, cooperative, no acute distress  HEENT:  Pupil equal and reactive to light, no pallor, no icterus.                  Moist mucous membranes  CVS:  Regular rate rhythm, S1-S2 normal, no murmur, gallops or rubs  Chest: Decreased A/E b/l  with bibasilar crackles. No overt respiratory distress.  Abdomen:  Soft, nontender.  No rebound, guarding, rigidity.  Bowel sounds normoactive.  Extremities:  No lower extremity pitting edema noted.  Distal pulse palpable bilaterally  Skin:  Warm dry, no rash   Neurologic:  Alert, cooperative.  No obvious focal motor deficits  ??  Diagnostic Tests:??   ??  Recent Results (from the past  24 hour(s))   GLUCOSE, POC    Collection Time: 08/23/17  9:47 PM   Result Value Ref Range    Glucose (POC) 162 (H) 70 - 105 mg/dL    Performed by CHENEY TARA    METABOLIC PANEL, BASIC    Collection Time: 08/24/17  5:33 AM   Result Value Ref Range    Sodium 139 136 - 145 mmol/L    Potassium 4.1 3.5 - 5.1 mmol/L    Chloride 106 98 - 110 mmol/L    CO2 28 21 - 32 mmol/L    Anion gap 5 5 - 15 mmol/L    Glucose 172 (H) 70 - 100 mg/dL    BUN 17 7 - 18 MG/DL    Creatinine 1.17 0.70 - 1.30 MG/DL    BUN/Creatinine ratio 15 12 - 20    GFR est AA >60 >60 ml/min/1.56m2    GFR est non-AA >60 >60 ml/min/1.93m2    Calcium 8.6 8.5 - 10.1 MG/DL       All Micro Results     None          Xr Chest Pa Lat    Result Date: 08/24/2017  : 1. THERE ARE NO ACUTE CARDIOPULMONARY FINDINGS SEEN. 2. CHRONIC ELEVATION OF THE LEFT HEMIDIAPHRAGM. Dict 08/22/17 13:55 MGQ#[id number]    Ct Abd Pelv W Cont    Result Date: 08/23/2017  : 1. THERE IS A 10-15 MM SEGMENT OF PROXIMAL TO MID ILEUM WHICH DEMONSTRATES BOWEL WALL THICKENING AND LUMINAL NARROWING CONSISTENT WITH ENTERITIS.  A SECOND SMALLER SEGMENT IS SEEN MORE DISTALLY.  THIS IS ASSOCIATED WITH A COMPONENT OF SMALL BOWEL OBSTRUCTION.  DIFFERENTIAL CONSIDERATIONS INCLUDE INFLAMMATORY BOWEL DISEASE.  OTHER CAUSES OF ENTERITIS INCLUDING ISCHEMIA IS IN THE DIFFERENTIAL ALTHOUGH LESS LIKELY. 2. DIVERTICULOSIS.  NO EVIDENCE OF DIVERTICULITIS. 3. PROSTATE ENLARGEMENT. 4. BILATERAL RENAL CYSTS. 5. SMALL NON-OBSTRUCTING RIGHT RENAL CALCULUS. COMMENT: The findings were discussed with Dr. Marlou Porch following the exam on 08/22/17. Dictated 08/22/2017   14:58   Job #[id number]      Echo Results  (Last 48 hours)    None            Current Discharge Medication List      START taking these medications    Details   metroNIDAZOLE (FLAGYL) 500 mg tablet Take 1 Tab by mouth three (3) times daily for  7 days.  Qty: 21 Tab, Refills: 0    Associated Diagnoses: Enteritis      levoFLOXacin (LEVAQUIN) 500 mg tablet Take 1 Tab by  mouth daily for 5 days.  Qty: 5 Tab, Refills: 0    Associated Diagnoses: Enteritis         CONTINUE these medications which have NOT CHANGED    Details   calcium carbonate (CALTREX) 600 mg calcium (1,500 mg) tablet Take 600 mg by mouth daily.      cholecalciferol, VITAMIN D3, (VITAMIN D3) 5,000 unit tab tablet Take 5,000 Units by mouth daily.      ascorbic acid, vitamin C, (VITAMIN C) 1,000 mg tablet Take 1,000 mg by mouth daily.      ipratropium (ATROVENT) 0.02 % soln 0.5 mg by Nebulization route four (4) times daily.      metFORMIN (GLUCOPHAGE) 500 mg tablet Take  by mouth two (2) times daily (with meals).      omeprazole (PRILOSEC) 40 mg capsule Take 40 mg by mouth two (2) times a day.      allopurinol (ZYLOPRIM) 300 mg tablet Take 300 mg by mouth daily.      ALBUTEROL SULFATE PO Take 3 Mls by nebulization every 4 hours as needed for wheezing   Indications: Albuterol Sulfate (2.5 MG/3ML) 0.083% Inhalation Nebulizatio (Albuterol Sulfate)      traMADol (ULTRAM) 50 mg tablet Take 50 mg by mouth every six (6) hours as needed.      traZODone (DESYREL) 100 mg tablet Take 100 mg by mouth nightly.      tamsulosin (FLOMAX) 0.4 mg capsule Take 0.4 mg by mouth daily.      budesonide-formoterol (SYMBICORT) 160-4.5 mcg/actuation HFAA 2 Puffs two (2) times a day. Take as directed       dicyclomine (BENTYL) 20 mg tablet Take 20 mg by mouth every six (6) hours. As needed      apixaban (ELIQUIS) 5 mg tablet Take 5 mg by mouth two (2) times a day.      atorvastatin (LIPITOR) 40 mg tablet Take 40 mg by mouth daily.      aspirin delayed-release 81 mg tablet Take 81 mg by mouth daily.      polyethylene glycol (MIRALAX) 17 gram/dose powder 17 g every evening. take 17gms by disolved in water daily as needed for constipation       ferrous sulfate 325 mg (65 mg iron) tablet Take  by mouth three (3) times daily (with meals).         STOP taking these medications       docusate sodium (DULCOLAX STOOL SOFTENER, DSS,) 100 mg capsule  Comments:   Reason for Stopping:         losartan (COZAAR) 25 mg tablet Comments:   Reason for Stopping:            New medication, also includes metoprolol XL 25 mg daily.    Condition: Stable    Code: Full Code    Disposition:   Discharge home    The care plan, discharge diagnosis and medications, including side effects and interactions was discussed with the patient in detail. He/she expressed understanding and is in agreement. Irma H [place] has been advised to seek immediate medical attention for any acute or worsening symptoms.    Total time spent in evaluation management and discharge more than 60 min.    Raymond Gurney, MD  08/24/2017

## 2017-08-24 NOTE — Discharge Summary (Signed)
Hospitalist Discharge Summary  ??  ??  Name:  Paul [name]     DOB:  Dec 23, 1945    MRM:  [id number]  ????  Admit Date: 08/22/2017   ??  Discharge Date:   08/24/2017  ??  Primary Care Provider: Tally Joe, MD  ??  ??  Consultations:   Dr. Dorothea Ogle    Procedures:   None    Discharge Diagnosis:  Diffuse abdominal pain  Suspect acute enteritis with partial small bowel obstruction  Atrial fibrillation rate controlled  Secondary Diagnosis:   ??  Past Medical History:   Diagnosis Date   ??? A-fib ([place])    ??? Adenomatous colon polyp     SERRATED; DUE 18/19   ??? Adenomatous duodenal polyp    ??? AVM (arteriovenous malformation)     DUODENAL   ??? CAD (coronary artery disease)     SP MI   ??? 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 COPD (chronic obstructive pulmonary disease) (HCC)     QUIT TOBACCO SMOKING 2 YRS AGO   ??? Diabetes ([place])    ??? Diverticulosis of colon    ??? Dyslipidemia    ??? Hiatal hernia    ??? History of blood transfusion 2017    FOR PROFOUND ANEMIA   ??? IBS (irritable bowel syndrome)    ??? Iron deficiency     SEVERE-INFUSIONS 5/17      ??  History of the Present Illness:   Patient is a pleasant 71 year old gentleman, with multiple medical comorbidities including iron deficiency anemia, history of colonic and duodenal adenomatous polyps, remote history of bowel resection, atrial fibrillation on chronic anticoagulation, coronary artery disease non-insulin-dependent diabetes type 2 who presents with generalized abdominal pain, since 1 day. ??Initial workup reveals mild leukocytosis. ??CT scan of abdomen and pelvis reveals 2 areas of bowel wall thickening in mid ileum, with luminal narrowing causing an obstructive pattern    Hospital Course and Summary:  He was treated supportively with IV hydration, diet was gradually escalated as he tolerated and did well.  He had a bowel movement yesterday with no evidence of blood or loose stools.  He was empirically treated  with IV antibiotics including levofloxacin and metronidazole which has been changed to oral at the time of discharge to complete a full 7 day course.  Patient will need a close follow-up with Gastroenterology in 4-6 weeks for possible outpatient CT or MRI enterography.    For hypertension patient had been on losartan which has been discontinued in setting of mild renal impairment as well as remote possibility of angioedema however less likely.    Atrial fibrillation- rate controlled.  On review of medication, it seems like patient is not on any beta blockers.  Metoprolol XL 25 mg daily has been initiated    Advised to continue apixaban.  Advised to follow up with his Cardiology/primary care physician for blood pressure monitoring as well as medication reconciliation.    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  The time of discharge creatinine is 1.17, baseline.      ??  Vitals:??   ??  Patient Vitals for the past 24 hrs:   Temp Pulse Resp BP SpO2   08/24/17 1136 ??? ??? ??? ??? 95 %   08/24/17 1004 ??? 66 ??? 120/70 ???   08/24/17 0504 98.1 ??F (36.7 ??C) 62 24 128/58 ???   08/23/17 2140 ??? 70 ??? 110/68 ???  08/23/17 2100 ??? ??? ??? ??? 96 %   08/23/17 2007 ??? ??? ??? ??? 95 %   08/23/17 1517 ??? ??? ??? ??? 95 %   08/23/17 1300 98.4 ??F (36.9 ??C) 84 16 133/68 ???   08/23/17 1256 ??? 79 ??? 133/69 ???     ??  ??  Physical Exam:??   ??  PHYSICAL EXAM:   General: Alert, cooperative, no acute distress  HEENT:  Pupil equal and reactive to light, no pallor, no icterus.                  Moist mucous membranes  CVS:  Regular rate rhythm, S1-S2 normal, no murmur, gallops or rubs  Chest: Decreased A/E b/l  with bibasilar crackles. No overt respiratory distress.  Abdomen:  Soft, nontender.  No rebound, guarding, rigidity.  Bowel sounds normoactive.  Extremities:  No lower extremity pitting edema noted.  Distal pulse palpable bilaterally  Skin:  Warm dry, no rash   Neurologic:  Alert, cooperative.  No obvious focal motor deficits  ??   Diagnostic Tests:??   ??  Recent Results (from the past 24 hour(s))   GLUCOSE, POC    Collection Time: 08/23/17  9:47 PM   Result Value Ref Range    Glucose (POC) 162 (H) 70 - 105 mg/dL    Performed by CHENEY TARA    METABOLIC PANEL, BASIC    Collection Time: 08/24/17  5:33 AM   Result Value Ref Range    Sodium 139 136 - 145 mmol/L    Potassium 4.1 3.5 - 5.1 mmol/L    Chloride 106 98 - 110 mmol/L    CO2 28 21 - 32 mmol/L    Anion gap 5 5 - 15 mmol/L    Glucose 172 (H) 70 - 100 mg/dL    BUN 17 7 - 18 MG/DL    Creatinine 1.17 0.70 - 1.30 MG/DL    BUN/Creatinine ratio 15 12 - 20    GFR est AA >60 >60 ml/min/1.37m2    GFR est non-AA >60 >60 ml/min/1.58m2    Calcium 8.6 8.5 - 10.1 MG/DL       All Micro Results     None          Xr Chest Pa Lat    Result Date: 08/24/2017  : 1. THERE ARE NO ACUTE CARDIOPULMONARY FINDINGS SEEN. 2. CHRONIC ELEVATION OF THE LEFT HEMIDIAPHRAGM. Dict 08/22/17 13:55 [id number]    Ct Abd Pelv W Cont    Result Date: 08/23/2017  : 1. THERE IS A 10-15 MM SEGMENT OF PROXIMAL TO MID ILEUM WHICH DEMONSTRATES BOWEL WALL THICKENING AND LUMINAL NARROWING CONSISTENT WITH ENTERITIS.  A SECOND SMALLER SEGMENT IS SEEN MORE DISTALLY.  THIS IS ASSOCIATED WITH A COMPONENT OF SMALL BOWEL OBSTRUCTION.  DIFFERENTIAL CONSIDERATIONS INCLUDE INFLAMMATORY BOWEL DISEASE.  OTHER CAUSES OF ENTERITIS INCLUDING ISCHEMIA IS IN THE DIFFERENTIAL ALTHOUGH LESS LIKELY. 2. DIVERTICULOSIS.  NO EVIDENCE OF DIVERTICULITIS. 3. PROSTATE ENLARGEMENT. 4. BILATERAL RENAL CYSTS. 5. SMALL NON-OBSTRUCTING RIGHT RENAL CALCULUS. COMMENT: The findings were discussed with Dr. Marlou Porch following the exam on 08/22/17. Dictated 08/22/2017   14:58   Job #[id number]      Echo Results  (Last 48 hours)    None            Current Discharge Medication List      START taking these medications    Details   metroNIDAZOLE (FLAGYL) 500 mg tablet Take 1 Tab by mouth three (3) times daily for  7 days.  Qty: 21 Tab, Refills: 0    Associated Diagnoses: Enteritis       levoFLOXacin (LEVAQUIN) 500 mg tablet Take 1 Tab by mouth daily for 5 days.  Qty: 5 Tab, Refills: 0    Associated Diagnoses: Enteritis         CONTINUE these medications which have NOT CHANGED    Details   calcium carbonate (CALTREX) 600 mg calcium (1,500 mg) tablet Take 600 mg by mouth daily.      cholecalciferol, VITAMIN D3, (VITAMIN D3) 5,000 unit tab tablet Take 5,000 Units by mouth daily.      ascorbic acid, vitamin C, (VITAMIN C) 1,000 mg tablet Take 1,000 mg by mouth daily.      ipratropium (ATROVENT) 0.02 % soln 0.5 mg by Nebulization route four (4) times daily.      metFORMIN (GLUCOPHAGE) 500 mg tablet Take  by mouth two (2) times daily (with meals).      omeprazole (PRILOSEC) 40 mg capsule Take 40 mg by mouth two (2) times a day.      allopurinol (ZYLOPRIM) 300 mg tablet Take 300 mg by mouth daily.      ALBUTEROL SULFATE PO Take 3 Mls by nebulization every 4 hours as needed for wheezing   Indications: Albuterol Sulfate (2.5 MG/3ML) 0.083% Inhalation Nebulizatio (Albuterol Sulfate)      traMADol (ULTRAM) 50 mg tablet Take 50 mg by mouth every six (6) hours as needed.      traZODone (DESYREL) 100 mg tablet Take 100 mg by mouth nightly.      tamsulosin (FLOMAX) 0.4 mg capsule Take 0.4 mg by mouth daily.      budesonide-formoterol (SYMBICORT) 160-4.5 mcg/actuation HFAA 2 Puffs two (2) times a day. Take as directed       dicyclomine (BENTYL) 20 mg tablet Take 20 mg by mouth every six (6) hours. As needed      apixaban (ELIQUIS) 5 mg tablet Take 5 mg by mouth two (2) times a day.      atorvastatin (LIPITOR) 40 mg tablet Take 40 mg by mouth daily.      aspirin delayed-release 81 mg tablet Take 81 mg by mouth daily.      polyethylene glycol (MIRALAX) 17 gram/dose powder 17 g every evening. take 17gms by disolved in water daily as needed for constipation       ferrous sulfate 325 mg (65 mg iron) tablet Take  by mouth three (3) times daily (with meals).         STOP taking these medications        docusate sodium (DULCOLAX STOOL SOFTENER, DSS,) 100 mg capsule Comments:   Reason for Stopping:         losartan (COZAAR) 25 mg tablet Comments:   Reason for Stopping:            New medication, also includes metoprolol XL 25 mg daily.    Condition: Stable    Code: Full Code    Disposition:   Discharge home    The care plan, discharge diagnosis and medications, including side effects and interactions was discussed with the patient in detail. He/she expressed understanding and is in agreement. Rohan H Kalil has been advised to seek immediate medical attention for any acute or worsening symptoms.    Total time spent in evaluation management and discharge more than 60 min.    Raymond Gurney, MD  08/24/2017

## 2017-08-24 NOTE — Progress Notes (Addendum)
Gastrointestinal Progress Note    08/24/2017    Admit Date: 08/22/2017    Subjective:   Feeling well.  Tolerating low fiber diet.  Denies pain, nausea, vomiting.  Passed a stool last evening.  Anxious to go home.      Current Facility-Administered Medications   Medication Dose Route Frequency   ??? diphenhydrAMINE (BENADRYL) tablet 25 mg  25 mg Oral NOW   ??? traZODone (DESYREL) tablet 100 mg  100 mg Oral QHS   ??? metoprolol tartrate (LOPRESSOR) tablet 12.5 mg  12.5 mg Oral Q12H   ??? apixaban (ELIQUIS) tablet 5 mg  5 mg Oral BID   ??? sodium chloride (NS) flush 3-10 mL  3-10 mL IntraVENous Q12H   ??? sodium chloride (NS) flush 3-10 mL  3-10 mL IntraVENous PRN   ??? naloxone (NARCAN) injection 0.4 mg  0.4 mg IntraVENous PRN   ??? morphine injection 2 mg  2 mg IntraVENous Q6H PRN   ??? ondansetron (ZOFRAN) injection 4 mg  4 mg IntraVENous Q6H PRN    Or   ??? ondansetron (ZOFRAN ODT) tablet 4 mg  4 mg Oral Q6H PRN   ??? levoFLOXacin (LEVAQUIN) 500 mg in D5W IVPB  500 mg IntraVENous Q24H   ??? glucose chewable tablet 16 g  4 Tab Oral PRN   ??? dextrose 40% (GLUTOSE) oral gel 1 Tube  1 Tube Oral PRN   ??? glucagon (GLUCAGEN) injection 1 mg  1 mg IntraMUSCular PRN   ??? dextrose 10% infusion 250 mL  250 mL IntraVENous PRN   ??? metroNIDAZOLE (FLAGYL) IVPB premix 500 mg  500 mg IntraVENous Q8H   ??? metoprolol (LOPRESSOR) injection 2.5 mg  2.5 mg IntraVENous Q8H PRN   ??? mometasone-formoterol (DULERA) 246mcg-5mcg/puff  2 Puff Inhalation BID RT   ??? albuterol (PROVENTIL VENTOLIN) nebulizer solution 2.5 mg  2.5 mg Nebulization Q4H PRN   ??? albuterol-ipratropium (DUO-NEB) 2.5 MG-0.5 MG/3 ML  3 mL Nebulization QID RT        Objective:     Blood pressure 120/70, pulse 66, temperature 98.1 ??F (36.7 ??C), resp. rate 24, height 5\' 9"  (1.753 m), weight 88.6 kg (195 lb 5.2 oz), SpO2 96 %.    12/12 0701 - 12/12 1900  In: 400 [P.O.:400]  Out: -     12/10 1901 - 12/12 0700  In: 240 [P.O.:140; I.V.:100]  Out: -     EXAM:   General: Appears comfortable. A&Ox3  Cardio: RRR   Pulm: Clear  Abd: Normoactive BS, less distended, soft, non tender.     Data Review      Recent Results (from the past 24 hour(s))   GLUCOSE, POC    Collection Time: 08/23/17  9:47 PM   Result Value Ref Range    Glucose (POC) 162 (H) 70 - 105 mg/dL    Performed by CHENEY TARA    METABOLIC PANEL, BASIC    Collection Time: 08/24/17  5:33 AM   Result Value Ref Range    Sodium 139 136 - 145 mmol/L    Potassium 4.1 3.5 - 5.1 mmol/L    Chloride 106 98 - 110 mmol/L    CO2 28 21 - 32 mmol/L    Anion gap 5 5 - 15 mmol/L    Glucose 172 (H) 70 - 100 mg/dL    BUN 17 7 - 18 MG/DL    Creatinine 1.17 0.70 - 1.30 MG/DL    BUN/Creatinine ratio 15 12 - 20    GFR est AA >  60 >60 ml/min/1.50m2    GFR est non-AA >60 >60 ml/min/1.65m2    Calcium 8.6 8.5 - 10.1 MG/DL         Assessment:   Acute enteritis with partial small bowel obstruction:   Differential includes acute infectious enteritis versus angioedema related to ARB, has been discontinued.  Less likely inflammatory bowel disease.  Clinically improved.      Plan:   1.  Plan is for outpatient CT or MRI enterography in 4-6 weeks.  Complete 7 days antibiotics.  2.  Will sign off     Discussed with Dr. Berton Lan    This patient was seen by me in person in addition to the allied provider. The chart was reviewed, the patient examined, the plan was discussed with the patient and I agree with the above note and plan.

## 2017-10-07 ENCOUNTER — Ambulatory Visit
Admit: 2017-10-07 | Discharge: 2017-10-07 | Payer: PRIVATE HEALTH INSURANCE | Attending: Internal Medicine | Primary: Family Medicine

## 2017-10-07 DIAGNOSIS — K529 Noninfective gastroenteritis and colitis, unspecified: Secondary | ICD-10-CM

## 2017-10-07 NOTE — Progress Notes (Signed)
10/07/17 Follow up: Here for follow up  after admission with abrupt onset periumbilical abdominal pain and imaging showing enteritis with partial small-bowel obstruction.  Patient manage conservatively with antibiotics.  Differential included acute infectious enteritis versus angioedema related to angiotensin receptor blocker and less likely Crohn's disease.   Feeling better since discharge. No longer has abdominal pain, nausea and vomiting. Has seen [name] since discharge. Treated for a fungal infection of the skin.  No overt GI bleeding.  Is no longer on the angiotensin receptor blocker.                  Also followed for chronic issues of iron-deficiency anemia, abdominal pain, bowel habit changes. Since last visit with me September 2018, he had blood work which showed that the elevated alkaline phosphatase was of bone origin.    Adherent to bowel regimen moving bowels 3 times a day.  Is satisfied with the bowel regimen.  Denies any incontinence or anal seepage.  Denies diarrhea.    ROS:  Has chronic dyspnea on exertion related to COPD.   8 point review of systems negative   ??  Labs:   Negative hepatitis B surface antigen, ceruloplasmin normal and AMA negative.  Alkaline phosphatase isoenzymes showing bone origin.  Imaging:  Reviewed results of CT scan done as inpatient December 2018.  Records:  Reviewed GI consult note, followup notes and discharge summary from admission December 2018.  Prior workup:  1. EGD/colonoscopy 05/30/17: chronic gastritis (H.Pylori negative), gastric nodule, adenomatous duodenal polyp, single adenomatous colon polyp, diverticulosis, and external hemorrhoids.  Random colon biopsies negative for microscopic colitis.  Repeat recommended in 3 years.  2. CT abdomen/pelvis 09/25/16: resolution of previously noted fatty infiltration liver, bilateral renal cysts, normal gallbladder appearance, mildly prominent periportal lymph nodes stable since 2/14, scattered subcm  retroperitoneal lymph nodes, numerous scattered diverticula, small bilateral inguinal hernias, prostate enlargement     3. CT enterography 01/22/16: colonic diverticulosis, fatty appearing liver, nonspecific edematous change around the gallbladder, bilateral small fat filled inguinal hernias, bilateral renal cysts, coronary artery calcifications, prostate enlargement  4. 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 EGD and colonoscopy 04/12/16: Seven sessile duodenal polyps, mild gastritis, 6 sub cm polyps, pan diverticulosis, s/p sigmoid resection with normal appearing anastomosis, external hemorrhoids  6. Capsule endoscopy 01/26/16: gastritis, duodenal non bleeding AVM and polyps.  Incomplete/suboptimal exam due to intraluminal contents      ??  Impression:   Acute issue:  1.  Acute enteritis with partial small-bowel obstruction:  Responded to conservative measures.  Completed antibiotics.  Requires follow-up imaging to document chronicity versus resolution.  Angiotensin receptor blocker stopped.  Chronic issues:  1. 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 On oral iron. Had recieved iv iron as well. H&H stable but iron stores low. Follows with Dr. [name]. No overt gi bleeding. Worsened by being on chronic anticoagulation.  2. Adenomatous duodenal polyps:   Had a single small duodenal adenoma September 2018.  Have discussed options with patient and recommended a repeat EGD in no greater than 3 years from now.  Can consider repeat endoscopy next year especially if he has any symptoms.  3. Adenomatous colon polyps- several removed 03/2016 and a single adenoma removed September 2018.  Prep was suboptimal but sufficient for lesions  greater than 6  mm.  As a compromise have decided on repeat colonoscopy no later than September 2021.  4. 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  Reports having had blood work at [facility] showing normal iron stores and CBC.  Will obtain records.  No overt GI bleeding.  5. Abnormal CT scan abdomen/ abnormal GB appearance- non specific.   Has marginal elevation in alkaline phosphatase but normal GGTP and alkaline phosphatase isoenzymes suggest bone origin.  Pain not consistent with colic.  Has seen surgery and they recommended against cholecystectomy.  His upper abdominal pain improved with bowel prep pointing against gallbladder as cause.  6. Anal seepage: Rectoanal agnosia.  improved with new bowel regimen.   I suspect constipation/ fecal impaction. Is on miralax and iron. Colonoscopy negative for IBD/tumor.  7. Fatty liver- NAFLD. Has risk factors.   Liver panel shows marginal elevation alkaline phosphatase but normal GGTP and alkaline phosphatase isoenzymes show bone origin.  CT scan done January 2018 showed no evidence of fatty liver per radiologist.  8.  Chronic constipation:  Response to regimen of MiraLax at bedtime and Dulcolax suppository.  Satisfied with symptom control.  No indication for small intestinal bacterial overgrowth testing at this time.  Discussion:  Discussed need to repeat enterography.  Discuss followup capsule endoscopy based on results.   Discussed results of blood work and need to discuss bone source of alkaline phosphatase with his primary care.  Plan:  1.  He will continue with MiraLax 17 g at bedtime.  He will begin Dulcolax suppository, 1 to be placed into the rectum, after breakfast.  He will give himself at least 30 min in the restroom.  2.   CT enterography.  Based on results will order the video capsule endoscopy.  3.  Next EGD and colonoscopy in 3 years unless the patient develops any  upper GI symptoms or blood work shows worsening iron-deficiency and/or anemia.   4.   He will discuss elevated bone fraction of alkaline phosphatase with his primary care at his next visit.    Counseling dominated visit of 15 minutes, with 12 minutes spent in face to face counseling and included discussion of care coordination, management advice, education and various treatment options as outlined above.     RTC 3 months      Discussed the patient's BMI with him.  The BMI follow up plan is as follows:     dietary management education, guidance, and counseling  encourage exercise  monitor weight  prescribed dietary intake    An After Visit Summary was printed and given to the patient.

## 2017-10-07 NOTE — Patient Instructions (Signed)
Body Mass Index: Care Instructions  Your Care Instructions    Body mass index (BMI) can help you see if your weight is raising your risk for health problems. It uses a formula to compare how much you weigh with how tall you are.  ?? A BMI lower than 18.5 is considered underweight.  ?? A BMI between 18.5 and 24.9 is considered healthy.  ?? A BMI between 25 and 29.9 is considered overweight. A BMI of 30 or higher is considered obese.  If your BMI is in the normal range, it means that you have a lower risk for weight-related health problems. If your BMI is in the overweight or obese range, you may be at increased risk for weight-related health problems, such as high blood pressure, heart disease, stroke, arthritis or joint pain, and diabetes. If your BMI is in the underweight range, you may be at increased risk for health problems such as fatigue, lower protection (immunity) against illness, muscle loss, bone loss, hair loss, and hormone problems.  BMI is just one measure of your risk for weight-related health problems. You may be at higher risk for health problems if you are not active, you eat an unhealthy diet, or you drink too much alcohol or use tobacco products.  Follow-up care is a key part of your treatment and safety. Be sure to make and go to all appointments, and call your doctor if you are having problems. It's also a good idea to know your test results and keep a list of the medicines you take.  How can you care for yourself at home?  ?? Practice healthy eating habits. This includes eating plenty of fruits, vegetables, whole grains, lean protein, and low-fat dairy.  ?? If your doctor recommends it, get more exercise. Walking is a good choice. Bit by bit, increase the amount you walk every day. Try for at least 30 minutes on most days of the week.  ?? Do not smoke. Smoking can increase your risk for health problems. If you need help quitting, talk to your doctor about stop-smoking programs and  medicines. These can increase your chances of quitting for good.  ?? Limit alcohol to 2 drinks a day for men and 1 drink a day for women. Too much alcohol can cause health problems.  If you have a BMI higher than 25  ?? Your doctor may do other tests to check your risk for weight-related health problems. This may include measuring the distance around your waist. A waist measurement of more than 40 inches in men or 35 inches in women can increase the risk of weight-related health problems.  ?? Talk with your doctor about steps you can take to stay healthy or improve your health. You may need to make lifestyle changes to lose weight and stay healthy, such as changing your diet and getting regular exercise.  If you have a BMI lower than 18.5  ?? Your doctor may do other tests to check your risk for health problems.  ?? Talk with your doctor about steps you can take to stay healthy or improve your health. You may need to make lifestyle changes to gain or maintain weight and stay healthy, such as getting more healthy foods in your diet and doing exercises to build muscle.  Where can you learn more?  Go to http://www.healthwise.net/[employer].  Enter S176 in the search box to learn more about "Body Mass Index: Care Instructions."  Current as of: June 26, 2015  Content Version: 11.4  ??   2006-2017 [employer]. Care instructions adapted under license by [employer] (which disclaims liability or warranty for this information). If you have questions about a medical condition or this instruction, always ask your healthcare professional. [employer] disclaims any warranty or liability for your use of this information.

## 2017-10-12 ENCOUNTER — Telehealth

## 2017-10-12 NOTE — Telephone Encounter (Signed)
Radiology usually has the patient stop metformin for 2 days prior to the procedure. He needs repeat BUN and creatinine done after the CT before resuming metformin.  I will place the order.

## 2017-10-12 NOTE — Telephone Encounter (Signed)
Pt advised ok to take Iron, aspirin, and eliquis per CT tech. No Metformin the day of the CT scan and two days after. Pt advised of need for labwork after exam. All questions answered..  Signed By: Jacky Kindle     October 12, 2017

## 2017-10-12 NOTE — Telephone Encounter (Signed)
Patient calling needs to know what medications he will need to stop to have his CT , states his pcp advised him to call her since Dr. Berton Lan  Ordered the CT. Pt- [phone]

## 2017-10-14 ENCOUNTER — Ambulatory Visit: Payer: PRIVATE HEALTH INSURANCE | Primary: Family Medicine

## 2017-10-19 NOTE — Telephone Encounter (Signed)
I called pt and he agrees to complete his lab work 10/20/17.

## 2017-10-19 NOTE — Telephone Encounter (Signed)
Patient scheduled for a CT on Friday morning, but needs labs first, and really should come the day before, because if his labs come back abn, and he has already drank the prep, they will have to cancel him.    If he really wants to just come early, due to the projected weather tomorrow, he can do that if he likes. She said his labs in December were normal, and that wasn't too long ago.     Regardless, she asked that the patient is notified, so he can plan accordingly.

## 2017-10-20 ENCOUNTER — Inpatient Hospital Stay: Admit: 2017-10-20 | Payer: PRIVATE HEALTH INSURANCE | Primary: Family Medicine

## 2017-10-20 DIAGNOSIS — R109 Unspecified abdominal pain: Secondary | ICD-10-CM

## 2017-10-20 LAB — BUN: BUN: 19 MG/DL — ABNORMAL HIGH (ref 7–18)

## 2017-10-20 LAB — CREATININE: Creatinine: 1.22 MG/DL (ref 0.70–1.30)

## 2017-10-21 ENCOUNTER — Inpatient Hospital Stay: Admit: 2017-10-21 | Payer: PRIVATE HEALTH INSURANCE | Attending: Internal Medicine | Primary: Family Medicine

## 2017-10-21 DIAGNOSIS — K529 Noninfective gastroenteritis and colitis, unspecified: Secondary | ICD-10-CM

## 2017-10-21 MED ORDER — BARIUM SULFATE 0.1 % (W/V), 0.1 % (W/W) ORAL SUSP
0.1 % | Freq: Once | ORAL | Status: AC
Start: 2017-10-21 — End: 2017-10-21
  Administered 2017-10-21: 13:00:00 via ORAL

## 2017-10-21 MED ORDER — IOHEXOL 350 MG IODINE/ML INTRAVENOUS SOLUTION
350 mg iodine/mL | Freq: Once | INTRAVENOUS | Status: AC
Start: 2017-10-21 — End: 2017-10-21
  Administered 2017-10-21: 13:00:00 via INTRAVENOUS

## 2017-10-21 MED ORDER — BARIUM SULFATE 0.1 % (W/V), 0.1 % (W/W) ORAL SUSP
0.1 % | Freq: Once | ORAL | Status: AC
Start: 2017-10-21 — End: 2017-10-21
  Administered 2017-10-21: 12:00:00 via ORAL

## 2017-10-21 MED FILL — VOLUMEN 0.1 % (W/V), 0.1 % (W/W) ORAL SUSPENSION: 0.1 % | ORAL | Qty: 450

## 2017-10-21 MED FILL — OMNIPAQUE 350 MG IODINE/ML INTRAVENOUS SOLUTION: 350 mg iodine/mL | INTRAVENOUS | Qty: 90

## 2017-10-26 ENCOUNTER — Telehealth

## 2017-10-26 ENCOUNTER — Encounter

## 2017-10-26 MED ORDER — HYOSCYAMINE 0.125 MG TAB, RAPID DISSOLVE
0.125 mg | ORAL_TABLET | Freq: Four times a day (QID) | ORAL | 0 refills | Status: AC | PRN
Start: 2017-10-26 — End: 2017-11-02

## 2017-10-26 MED ORDER — CIPROFLOXACIN 500 MG TAB
500 mg | ORAL_TABLET | Freq: Two times a day (BID) | ORAL | 0 refills | Status: AC
Start: 2017-10-26 — End: 2017-11-02

## 2017-10-26 MED ORDER — METRONIDAZOLE 500 MG TAB
500 mg | ORAL_TABLET | Freq: Three times a day (TID) | ORAL | 0 refills | Status: AC
Start: 2017-10-26 — End: 2017-11-02

## 2017-10-26 NOTE — Telephone Encounter (Signed)
Patient calling back want to see if Dr. Berton Lan can prescribe for his pain. Please call pt [phone]

## 2017-10-26 NOTE — Telephone Encounter (Signed)
Patient called and has questions about 2 of the three medications he received today.    ciprofloxacin HCl (CIPRO) 500 mg tablet    And     metroNIDAZOLE (FLAGYL) 500 mg tablet    Please call back at [phone].    ASAP.

## 2017-10-26 NOTE — Telephone Encounter (Signed)
Sent in prescription for antispasm medication which he can put under the tongue. He can take 1 tablet every 6 hr.  If needed he can take a 2nd tablet if 1 tablet a does not control the pain. He should monitor for dry eyes, dry mouth, confusion or difficulty urinating as these can be side effects.  If the pain is uncontrolled with this medication, he needs to go to Urgent Care/emergency room.

## 2017-10-26 NOTE — Telephone Encounter (Signed)
Pt advised of above. All questions answered.

## 2017-10-26 NOTE — Telephone Encounter (Signed)
Patient called with abrupt onset periumbilical cramp like non radiating abdominal pain this AM. No fever, N/V, obstipation or diarrhea. No GI bleeding. Had CTE 10/24/2017 negative for enteritis/SBO. Had multiple liquid BMs post test and does not feel constipation. Advised to remain on clear to full liquid bland diet and monitor symptoms. If pain uncontrolled, and or associated with N/V, fever, CP, SOB, obstipation- to head to the ER. If mild but persistent, to pick up antibiotics. I am ordering a video capsule. He is in agreement with this plan.

## 2017-10-26 NOTE — Telephone Encounter (Signed)
I s/w pt and he wanted to make sure he was supposed to be prescribed 2 antibiotics. Pt advised yes and to take as prescribed.

## 2017-10-26 NOTE — Addendum Note (Signed)
Addended by: Valetta Fuller on: 10/26/2017 10:22 AM     Modules accepted: Orders

## 2017-10-27 NOTE — Telephone Encounter (Signed)
LVM for pt to sched PILLCAM. Please send to me when they call back.

## 2017-10-28 NOTE — Telephone Encounter (Signed)
Received message from answering svc at 1216:  Wants to go on solid foods?

## 2017-10-28 NOTE — Telephone Encounter (Signed)
Returned patient's call letting him know that Dr. Berton Lan is out of the office today and message is being forwarded to him to address on Monday.  Patient voiced understanding and is comfortable with plan.  Please advise.  Signed By: Cala Bradford, LPN     October 28, 2017

## 2017-10-31 NOTE — Telephone Encounter (Signed)
Spoke to patient this morning.  Abdominal pain has resolved.  Anticholinergic helped pain.  Advised to stop uses for now and use p.r.n. should he have recurrence of abdominal pain. Advised to remain on low fiber diet for now.  Video capsule scheduled for March 4th as of now.  Reports symptoms of a severe URI.  He is monitoring symptoms and will consider proceeding to the emergency room with the fail to improve in the next 48 hr.  All questions answered.

## 2017-11-11 NOTE — Telephone Encounter (Signed)
Patient cancelled pill cam for Monday due to weather. He wants to know if he can come Tuesday morning

## 2017-11-14 ENCOUNTER — Ambulatory Visit: Primary: Family Medicine

## 2017-11-15 ENCOUNTER — Telehealth

## 2017-11-15 NOTE — Telephone Encounter (Signed)
Patient called stating he has no appetite whatsoever (started Sunday night 03/03).    Please call back at [phone].

## 2017-11-15 NOTE — Telephone Encounter (Signed)
Returned patient's call who complains of having no appetite and a full feeling in his stomach without pain, nausea/vomiting, fevers, loose stools, or constipation.  Patient states he had a large bowel movement this morning and he felt like "his guts were falling out".  Patient also "suggested", Dr. Berton Lan admit him to the hospital.  Patient aware his message is being forwarded to Dr. Berton Lan to address his concerns.  Patient advised to increase fluid intake where he's not eating.  Patient voiced understanding and is comfortable with plan.  Signed By: Cala Bradford, LPN     November 15, 5636

## 2017-11-15 NOTE — Telephone Encounter (Signed)
Spoke with patient. Reports early satiety and abdominal bloating.  Did have a bowel movement this morning which did not relieve symptoms.  Has been adherent to antispasmodic medication.  Denies any nausea, vomiting, GI bleeding, fever, diarrhea or obstipation.  Does have a past history of constipation.  Today he had eggs, 2 small sausages and oatmeal.  Advised the patient that he may be having symptoms due to the fatty nature of the meal and the consumption of fiber.  Advised him that I do not believe that he needs admission at this time.  Advised to monitor for fever, uncontrolled abdominal pain, uncontrolled nausea and vomiting or inability to pass flatus.  Should he develop these symptoms, advised to proceed to the emergency room.  In the interim I recommended an abdominal x-ray flat and upright to assess for ileus and a fecal loaded colon.  If negative, I recommended a gastric emptying scan.  I advised her diet of bland low-fiber food, small frequent meals.  Both patient and wife acknowledged understanding of the plan and were in agreement.

## 2017-11-17 ENCOUNTER — Telehealth

## 2017-11-17 ENCOUNTER — Inpatient Hospital Stay: Admit: 2017-11-17 | Payer: PRIVATE HEALTH INSURANCE | Primary: Family Medicine

## 2017-11-17 DIAGNOSIS — R140 Abdominal distension (gaseous): Secondary | ICD-10-CM

## 2017-11-17 NOTE — Telephone Encounter (Signed)
Discussion with Dr. Berton Lan as above. I have limited availability on 3/11 and may therefore not be able to see the pt until 3/12. Pt was made aware to go to the ER for any worsening symptoms in the interim.

## 2017-11-17 NOTE — Telephone Encounter (Signed)
11/17/17 called patient and informed him that abdominal x-ray suggests evidence of ileus/partial small-bowel obstruction/early bowel obstruction.  He reports that he is feeling a little better.  He did have some pain this morning but does not have any pain at this time.  He denies any nausea or vomiting.  He is having bowel movements and passing flatus.  He denies any GI bleeding.  I discussed options of proceeding to the emergency room or conservative management at home and expedited surgical opinion I advised him that he cannot have his video capsule study at this time.  He was amenable to conservative management at home.  He will remain on a full liquid diet.  He will proceed to the emergency room for nausea vomiting, uncontrolled abdominal pain or fever.  I called and discussed the case with Dr.Manoli who had seen him in the past for unrelated issue.  I discussed the clinical course, prior extensive workup including panendoscopy, CT enterography, video capsule endoscopy, recent hospitalization for abrupt onset abdominal pain, abnormal CT scan, rapid improvement in symptoms and negative follow-up CT enterography.  We went over the patient's surgical history and he does have a history of small-bowel resection in 1994.  The patient is not on any ACE inhibitor or angiotensin receptor blocker that may cause angioedema.  She is willing to see the patient by Monday 11/21/2017.

## 2017-11-21 ENCOUNTER — Encounter: Primary: Family Medicine

## 2017-11-21 NOTE — Telephone Encounter (Signed)
Not a candidate for capsule endoscopy at this time.  Will have to await surgical evaluation and follow-up.

## 2017-11-21 NOTE — Telephone Encounter (Signed)
Patient would like a call from the nurse regarding pill cam.    Please call back at [phone].

## 2017-11-21 NOTE — Telephone Encounter (Signed)
Pt advised of above. All questions answered.

## 2017-11-21 NOTE — Telephone Encounter (Signed)
Lvmcb.

## 2017-11-23 ENCOUNTER — Encounter: Primary: Family Medicine

## 2017-11-23 ENCOUNTER — Ambulatory Visit
Admit: 2017-11-23 | Discharge: 2017-11-23 | Payer: PRIVATE HEALTH INSURANCE | Attending: Surgery | Primary: Family Medicine

## 2017-11-23 DIAGNOSIS — R1084 Generalized abdominal pain: Secondary | ICD-10-CM

## 2017-11-23 NOTE — Patient Instructions (Addendum)
Gas and Bloating: Care Instructions  Your Care Instructions    Gas and bloating can be uncomfortable and embarrassing problems. All people pass gas, but some people produce more gas than others, sometimes enough to cause distress. It is normal to pass gas from 6 to 20 times per day. Excess gas usually is not caused by a serious health problem.  Gas and bloating usually are caused by something you eat or drink, including some food supplements and medicines.  Gas and bloating are usually harmless and go away without treatment. However, changing your diet can help end the problem. Some over-the-counter medicines can help prevent gas and relieve bloating.  Follow-up care is a key part of your treatment and safety. Be sure to make and go to all appointments, and call your doctor if you are having problems. It's also a good idea to know your test results and keep a list of the medicines you take.  How can you care for yourself at home?  ?? Keep a food diary if you think a food gives you gas. Write down what you eat or drink. Also record when you get gas. If you notice that a food seems to cause your gas each time, avoid it and see if the gas goes away. Examples of foods that cause gas include:  ? Fried and fatty foods.  ? Beans.  ? Vegetables such as artichokes, asparagus, broccoli, brussels sprouts, cabbage, cauliflower, cucumbers, green peppers, onions, peas, radishes, and raw potatoes.  ? Fruits such as apricots, bananas, melons, peaches, pears, prunes, and raw apples.  ? Wheat and wheat bran.  ?? Soak dry beans in water overnight, then dump the water and cook the soaked beans in new water. This can help prevent gas and bloating.  ?? If you have problems with lactose, avoid dairy products such as milk and cheese.  ?? Try not to swallow air. Do not drink through a straw, gulp your food, or chew gum.  ?? Take an over-the-counter medicine. Read and follow all instructions on the label.   ? Food enzymes, such as Beano, can be added to gas-producing foods to prevent gas.  ? Antacids, such as Maalox Anti-Gas and Mylanta Gas, can relieve bloating by making you burp. Be careful when you take over-the-counter antacid medicines. Many of these medicines have aspirin in them. Read the label to make sure that you are not taking more than the recommended dose. Too much aspirin can be harmful.  ? Activated charcoal tablets, such as CharcoCaps, may decrease odor from gas you pass.  ? If you have problems with lactose, you can take medicines such as Dairy Ease and Lactaid with dairy products to prevent gas and bloating.  ?? Get some exercise regularly.  When should you call for help?  Call 911 anytime you think you may need emergency care. For example, call if:  ?? ?? You have gas and signs of a heart attack, such as:  ? Chest pain or pressure.  ? Sweating.  ? Shortness of breath.  ? Nausea or vomiting.  ? Pain that spreads from the chest to the neck, jaw, or one or both shoulders or arms.  ? Dizziness or lightheadedness.  ? A fast or uneven pulse.  After calling 911, chew 1 adult-strength aspirin. Wait for an ambulance. Do not try to drive yourself.   ??Call your doctor now or seek immediate medical care if:  ?? ?? You have severe belly pain.   ?? ?? You   have blood in your stool.   ??Watch closely for changes in your health, and be sure to contact your doctor if:  ?? ?? You have blood or pus in your urine.   ?? ?? Your urine is cloudy or smells bad.   ?? ?? You are burping and have trouble swallowing.   ?? ?? You feel bloated and have swelling in your belly.   ?? ?? You do not get better as expected.   Where can you learn more?  Go to [email or web address].  Enter [phone] in the search box to learn more about "Gas and Bloating: Care Instructions."  Current as of: June 05, 2017  Content Version: 11.9  ?? 2006-2018 [employer]. Care instructions adapted under  license by [employer] (which disclaims liability or warranty for this information). If you have questions about a medical condition or this instruction, always ask your healthcare professional. [place] any warranty or liability for your use of this information.

## 2017-11-23 NOTE — Progress Notes (Signed)
HPI:   Patient known to me from a previous outpatient consultation in November 2017 which was requested for discussion regarding his gallbladder.  At that time, he had had extensive workup for ongoing abdominal discomfort.  An abdominal ultrasound incidentally noted gallbladder wall thickening for which reason I was asked to see the patient. His symptoms were inconsistent with cholecystitis or biliary colic (his pain was primarily left lower quadrant).  Because his gallbladder did not seem to be contributing to his symptoms at the time, I advised against cholecystectomy.    He presents now for urgent re-consultation at the request of his GI provider, Dr. Duard Larsen.  Since I saw the patient last, he has continued to have intermittent episodes of abdominal discomfort, severe bloating, significant foul-smelling flatulence, and frequent explosive bowel movements.  Patient was recently hospitalized in early December 2018 for similar symptoms that had acutely worsened.  CT scan of the abdomen pelvis obtained during that admission demonstrated 2 areas of bowel wall thickening in the mid ileum with luminal narrowing causing an obstructive pattern.  These small-bowel findings were felt to represent acute enteritis, possibly due to an infectious source versus secondary to angioedema from ARB use.  His ARB was immediately discontinued at the time of admission; he was started on broad-spectrum antibiotics; his symptoms resolved fairly promptly thereafter.      A repeat abdominal CT scan obtained as an outpatient on October 21, 2017 confirmed complete resolution of the previous small bowel findings. He has continued to follow-up with Dr. Berton Lan.  Capsule endoscopy was planned for further investigation.  On March 5th, the patient developed anorexia, early satiety, abdominal bloating with accompanying pain for which reason he called Dr. Berton Lan for advice.  Based upon the patient's symptoms, Dr.  Berton Lan ordered a KUB which was done on 11/17/17.  This study noted gaseous distention of bowel loops, especially the small bowel with multiple air-fluid levels.  Although the findings were suggestive of possible bowel obstruction, the patient was still passing a large amount of flatus and stool and was not nauseous or vomiting.  Dr. Berton Lan therefore did not feel the patient needed immediate admission; instead, he advised him over the phone on 3/7 to adhere to a light, mostly liquid diet, and to seek emergency medical care for any worsening symptoms.  Dr. Berton Lan then requested that I see the patient for further management.    At this time, the patient denies any persistent or recurrent abdominal pain.  His abdominal bloating has subsided.  He has had no fevers, chills, or sweats.  He has had no nausea or vomiting.  He is tolerating solid food, but has noticed a decreased appetite.  He continues to pass a large amount of flatus throughout the day; he is moving his bowels 2 to 3 times daily.  He reports formed, nonbloody stools.          Visit Vitals  BP 106/64 (BP 1 Location: Left arm, BP Patient Position: Sitting)   Pulse 64   Resp 14   Ht 5\' 9"  (1.753 m)   Wt 186 lb (84.4 kg)   BMI 27.47 kg/m??       PMH:  Past Medical History:   Diagnosis Date   ??? A-fib ([place])    ??? Adenomatous colon polyp     SERRATED; DUE 18/19   ??? Adenomatous duodenal polyp    ??? AVM (arteriovenous malformation)     DUODENAL   ??? CAD (coronary artery disease)  SP MI   ??? Cancer of right lung ([place])     SP SX 2015   ??? COPD (chronic obstructive pulmonary disease) ([facility])     QUIT TOBACCO SMOKING 2 YRS AGO   ??? Diabetes ([place])    ??? Diverticulosis of colon    ??? Dyslipidemia    ??? Hiatal hernia    ??? History of blood transfusion 2017    FOR PROFOUND ANEMIA   ??? IBS (irritable bowel syndrome)    ??? Iron deficiency     SEVERE-INFUSIONS 5/17     PSH:  Past Surgical History:   Procedure Laterality Date   ??? COLONOSCOPY N/A 05/30/2017     COLONOSCOPY performed by Valetta Fuller, MD at [facility]   ??? HX COLONOSCOPY  2013    RECOMMENDED 5 YR FOLLOWUP; REPEAT 4/17-VILLOUS ADENOMA SIGMOID, TUBULAR ADENOMA OF TRANSVERSE COLON, ILEUM AND ASCENDING COLON-CD3 IMMUNOSTAIN NEG   ??? HX ENDOSCOPY      2013-GASTRIC ULCERS; 2017 GASTRITIS; ADENOMTOUS DUODENAL POLYPS, NO BLEEDING   ??? HX HEART CATHETERIZATION  2003    Stent x 1   ??? HX OTHER ARTIFICIAL OPENING      DIAGNOSTIC BRONCHOSCOPY   ??? HX OTHER SURGICAL Right 06/2014    MIDDLE LOBE RESECTION   ??? HX PTCA     ??? HX SMALL BOWEL RESECTION  1994    "STOMACH HAD ATTACHED ITSELF TO SMALL BOWEL"   ??? PR COLONOSCOPY W/BIOPSY SINGLE/MULTIPLE  05/30/2017        ??? PR COLSC FLX W/RMVL OF TUMOR POLYP LESION SNARE TQ  05/30/2017        ??? PR EGD REMOVAL TUMOR POLYP/OTHER LESION SNARE [facility]  05/30/2017        ??? PR EGD TRANSORAL BIOPSY SINGLE/MULTIPLE  05/30/2017          Meds:    Current Outpatient Medications:   ???  metoprolol succinate (TOPROL-XL) 25 mg XL tablet, Take 1 Tab by mouth daily., Disp: 30 Tab, Rfl: 1  ???  calcium carbonate (CALTREX) 600 mg calcium (1,500 mg) tablet, Take 600 mg by mouth daily., Disp: , Rfl:   ???  cholecalciferol, VITAMIN D3, (VITAMIN D3) 5,000 unit tab tablet, Take 5,000 Units by mouth daily., Disp: , Rfl:   ???  ascorbic acid, vitamin C, (VITAMIN C) 1,000 mg tablet, Take 1,000 mg by mouth daily., Disp: , Rfl:   ???  ipratropium (ATROVENT) 0.02 % soln, 0.5 mg by Nebulization route four (4) times daily., Disp: , Rfl:   ???  metFORMIN (GLUCOPHAGE) 500 mg tablet, Take  by mouth two (2) times daily (with meals)., Disp: , Rfl:   ???  omeprazole (PRILOSEC) 40 mg capsule, Take 40 mg by mouth two (2) times a day., Disp: , Rfl:   ???  allopurinol (ZYLOPRIM) 300 mg tablet, Take 300 mg by mouth daily., Disp: , Rfl:   ???  ALBUTEROL SULFATE PO, Take 3 Mls by nebulization every 4 hours as needed for wheezing   Indications: Albuterol Sulfate (2.5 MG/3ML) 0.083% Inhalation Nebulizatio (Albuterol Sulfate), Disp: , Rfl:    ???  traMADol (ULTRAM) 50 mg tablet, Take 50 mg by mouth every six (6) hours as needed., Disp: , Rfl:   ???  traZODone (DESYREL) 100 mg tablet, Take 100 mg by mouth nightly., Disp: , Rfl:   ???  tamsulosin (FLOMAX) 0.4 mg capsule, Take 0.4 mg by mouth daily., Disp: , Rfl:   ???  budesonide-formoterol (SYMBICORT) 160-4.5 mcg/actuation HFAA, 2 Puffs two (2) times a  day. Take as directed , Disp: , Rfl:   ???  dicyclomine (BENTYL) 20 mg tablet, Take 20 mg by mouth every six (6) hours. As needed, Disp: , Rfl:   ???  apixaban (ELIQUIS) 5 mg tablet, Take 5 mg by mouth two (2) times a day., Disp: , Rfl:   ???  atorvastatin (LIPITOR) 40 mg tablet, Take 40 mg by mouth daily., Disp: , Rfl:   ???  aspirin delayed-release 81 mg tablet, Take 81 mg by mouth daily., Disp: , Rfl:   ???  polyethylene glycol (MIRALAX) 17 gram/dose powder, 17 g every evening. take 17gms by disolved in water daily as needed for constipation , Disp: , Rfl:   ???  ferrous sulfate 325 mg (65 mg iron) tablet, Take  by mouth three (3) times daily (with meals)., Disp: , Rfl:     Allergies:  No Known Allergies    Social hx:  Social History     Socioeconomic History   ??? Marital status: SINGLE     Spouse name: Not on file   ??? Number of children: Not on file   ??? Years of education: Not on file   ??? Highest education level: Not on file   Social Needs   ??? Financial resource strain: Not on file   ??? Food insecurity - worry: Not on file   ??? Food insecurity - inability: Not on file   ??? Transportation needs - medical: Not on file   ??? Transportation needs - non-medical: Not on file   Occupational History   ??? Occupation: Press photographer   Tobacco Use   ??? Smoking status: Former Smoker     Last attempt to quit: 06/2014     Years since quitting: 3.4   ??? Smokeless tobacco: Never Used   Substance and Sexual Activity   ??? Alcohol use: No   ??? Drug use: Not on file   ??? Sexual activity: Not on file   Other Topics Concern   ??? Not on file   Social History Narrative    PATIENT IS A FORMER SMOKER. QUIT IN OCT 2015     MARTIAL STATUS: WIDOWED. LIVES WITH GIRLFRIEND    CHILDREN: NO    OCCUPATION: WAS IN SALES       Family Hx:  Family History   Adopted: Yes   Family history unknown: Yes         ROS  General: no fatigue   H&N : no hearing or vision problems   Skin: no new skin lesions   CVS: no chest pain   Resp: no shortness of breath   Abd: see HPI  GU: no new urinary sx's   MSK: no weakness   Neuro: no focal neurologic complaints   Psych: no mood changes     Physical Exam   Constitutional: He is oriented to person, place, and time and well-developed, well-nourished, and in no distress.   HENT:   Head: Normocephalic and atraumatic.   Eyes: Conjunctivae and EOM are normal. Pupils are equal, round, and reactive to light.   Neck: Normal range of motion. Neck supple.   Cardiovascular:   Irregular rate and rhythm.   Pulmonary/Chest: Effort normal and breath sounds normal.   Abdominal:   Protuberant but very soft, not distended, nontender, positive bowel sounds.  Well-healed midline incision that extends supra and infraumbilically.  There is a nontender, reducible hernia associated with the upper portion of this incision.   Musculoskeletal: Normal range of motion. He exhibits no  edema.   Neurological: He is alert and oriented to person, place, and time. GCS score is 15.   Psychiatric: Affect normal.       Impression and Plan:  72 year old male with chronic abdominal/GI issues that have been extensively worked up in the past and have been managed under the care of Dr. Berton Lan.  The episode of symptoms that the patient experienced last week likely represented a partial small-bowel obstruction secondary to intra-abdominal adhesions from prior surgery. This episode has now resolved, and the patient has returned to his baseline.  As such, no further intervention or imaging investigation is needed.  Capsule endoscopy, which was previously requested by Dr. Berton Lan, can be rescheduled  at Dr. Fara Olden discretion/convenience.  As for the small-bowel findings noted during the patient's most recent hospital stay in December, I doubt these recurred to contribute to the patient's episode last week, although this is possible.  If indeed there is luminal narrowing along portions of the patient's small intestine, this should come to light during capsule endoscopy.  The patient is welcome to return to my care as needed.

## 2017-11-24 NOTE — Telephone Encounter (Signed)
12/06/17 8am

## 2017-11-24 NOTE — Telephone Encounter (Signed)
S/w pt and scheduled his PillCam for 2/26. Verified that pt still has the instructions and will be able to come back to the office at 4pm to remove sensors.

## 2017-11-30 NOTE — Telephone Encounter (Signed)
Pt advised it is ok to eat [place] food prior to his pill cam.

## 2017-11-30 NOTE — Telephone Encounter (Signed)
Patient calling wants to have [place] food this Friday wonders if this will effect pillcam on 12/06/17, please advise pt [phone]

## 2017-12-06 ENCOUNTER — Institutional Professional Consult (permissible substitution): Primary: Family Medicine

## 2017-12-06 ENCOUNTER — Institutional Professional Consult (permissible substitution): Admit: 2017-12-06 | Payer: PRIVATE HEALTH INSURANCE | Primary: Family Medicine

## 2017-12-06 DIAGNOSIS — D500 Iron deficiency anemia secondary to blood loss (chronic): Secondary | ICD-10-CM

## 2017-12-06 NOTE — Progress Notes (Signed)
Was seen in the office today for a capsule endoscopy. Consent form was signed. Successful application of data recorder with belt and leads placed. Pt was able to swallow the capsule without difficulty, instructions were given to patient for activities, water, diet intake, in which understanding was voiced. Pt agreed to return to the clinic 8 hours from the time of ingesting the capsule for removal of data recorder. Pt to follow up with referring provider. Capsule results to be reported after viewing images.

## 2017-12-06 NOTE — Progress Notes (Signed)
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  History of partial intermittent small-bowel obstruction.  Enteritis noted on CT scan.  Video capsule endoscopy done to look for Crohn's enteritis.  Findings:  The capsule remained in the stomach at the end of the 8 hr study.  Plan:  1.  Abdominal x-ray in 2 weeks to confirm capsule passage.  Sooner if the patient is symptomatic.  2.  Will consider gastric emptying scan.  Will have to consider repeat capsule with metoclopramide premedication versus deployment by endoscopy.

## 2017-12-07 ENCOUNTER — Telehealth

## 2017-12-07 NOTE — Telephone Encounter (Signed)
Call patient and wife.  Informed them that the video capsule remained in the stomach at the end of the 8 hr study.  Patient does have diabetes and a history of chronic constipation.  He denies any abdominal pain, nausea, vomiting or obstipation at this time.  Advised to proceed to the emergency room if he has any of these symptoms.   Discussed next steps.  Advised an abdominal x-ray in 2 weeks to ensure the capsule has passed.  Advised to monitor his bowel movements and if he notices the capsule to passed to call me right away.  I will repeat the x-ray at that point to assess colonic fecal load.  Based on that we did a bowel prep and order a gastric emptying scan.  The patient does have diabetes.  He is not on any opioids.  Based on the results of the x-ray and gastric emptying scan, will consider repeating the capsule with endoscopic placement versus premedication with metoclopramide.  All questions answered.  They are in agreement with the plan.

## 2017-12-07 NOTE — Addendum Note (Signed)
Addended by: Valetta Fuller on: 12/07/2017 01:23 PM     Modules accepted: Orders

## 2017-12-07 NOTE — Telephone Encounter (Signed)
Patient calling for pill cam results. Wants a call back. Pt [phone]

## 2017-12-19 ENCOUNTER — Telehealth

## 2017-12-19 NOTE — Telephone Encounter (Signed)
Patient is calling stating he passed the camera,.  Patient is wondering if he can have salad tonight or tomorrow.    Patient can be reached  At [phone]

## 2017-12-19 NOTE — Telephone Encounter (Signed)
Please advised the patient to come in for an abdominal x-ray at his convenience.  The x-rays to see how much stool is in his colon.  That will help me decide if he needs a gastric emptying scan and if and when he can have a repeat attempt at video capsule.  He can have a small salad but should avoid having a large amount of fiber at any given time or over the course of the day.

## 2017-12-19 NOTE — Telephone Encounter (Signed)
Pt advised of above. All questions answered.

## 2017-12-27 NOTE — Telephone Encounter (Signed)
Patient called in and has an appointment on Thurs. Patient wants to know if he needs to still have an xray on his stomach. Patient has more questions and would like a call back. [phone]

## 2017-12-27 NOTE — Telephone Encounter (Signed)
I s/w pt and reviewed Dr Maura Crandall last phone notes  With him in regards to having his xray done at his convenience. Pt cx his appt this week 12/29/17 as he is scheduled for a f/u in October and there is nothing new to discuss at this point as he has some potential upcoming tests to complete.

## 2017-12-29 ENCOUNTER — Encounter: Attending: Internal Medicine | Primary: Family Medicine

## 2018-01-13 ENCOUNTER — Telehealth

## 2018-01-13 NOTE — Telephone Encounter (Signed)
Pt and wife was advised of above results, orders and recommendations.

## 2018-01-13 NOTE — Telephone Encounter (Signed)
Paul [name], please let the patient know that the abdominal x-ray done at the [facility] on January 10, 2018 showed that the video capsule has passed.  It does show a significant amount of stool in the colon.  He will need to take a MiraLax only bowel prep (250 g in 2 L of clear liquid, twice, 12 hr apart).  I am ordering a gastric emptying scan.  He should take the prep 3-5 days prior to when the gastric emptying scan is scheduled.

## 2018-01-18 NOTE — Telephone Encounter (Signed)
Pt was advised of above and is aware of completing a complete colon prep prior to his procedure.

## 2018-01-18 NOTE — Telephone Encounter (Signed)
Pt states he is waiting for his dermatologist to get back to him to let him know what he's allergic to before he schedules his CT scan. He thinks he may be allergic to the dye.

## 2018-01-18 NOTE — Telephone Encounter (Signed)
I have not ordered another CT scan.  I have ordered a gastric emptying scan.  That does not in wall CT scan dye.  He has received CT scan dye as recently as January 2019 without an issue.  Please make him aware.  Keep in mind he will need to take a full colon prep prior to the gastric emptying scan.

## 2018-02-08 NOTE — Telephone Encounter (Signed)
Pt called to make Dr Berton Lan aware that he has been having chronic hives since 08/2017. Pt is seeing Dermatology. Pt wants to figure this out prior to having the ordered Gastric Emptying Scan done.

## 2018-02-20 NOTE — Telephone Encounter (Signed)
Claiborne Billings at [facility] is calling on behalf of [name] to find out which, if any, meds pt needs to hold for his gastric emptying study on 02/23/18. She is requesting a call back at [phone] (ok to LVM) She states she will relay message to both pt and PCP.

## 2018-02-21 NOTE — Telephone Encounter (Signed)
I faxed a copy of Gastric Emptying Scan instructions to [facility] at Dr Mariea Clonts office.

## 2018-02-23 ENCOUNTER — Encounter

## 2018-02-23 ENCOUNTER — Inpatient Hospital Stay: Admit: 2018-02-23 | Payer: PRIVATE HEALTH INSURANCE | Attending: Internal Medicine | Primary: Family Medicine

## 2018-02-23 DIAGNOSIS — E109 Type 1 diabetes mellitus without complications: Secondary | ICD-10-CM

## 2018-02-23 DIAGNOSIS — D509 Iron deficiency anemia, unspecified: Secondary | ICD-10-CM

## 2018-02-23 DIAGNOSIS — K30 Functional dyspepsia: Secondary | ICD-10-CM

## 2018-02-23 LAB — COMPREHENSIVE METABOLIC PANEL
ALT: 30 U/L (ref 16–61)
AST: 15 U/L (ref 15–37)
Albumin/Globulin Ratio: 1.3 (ref 1.0–3.0)
Albumin: 3.9 g/dL (ref 3.4–5.0)
Alkaline Phosphatase: 113 U/L (ref 45–117)
Anion Gap: 6 mmol/L (ref 5–15)
BUN: 29 MG/DL — ABNORMAL HIGH (ref 7–18)
Bun/Cre Ratio: 21 NA — ABNORMAL HIGH (ref 12–20)
CO2: 34 mmol/L — ABNORMAL HIGH (ref 21–32)
Calcium: 11.4 MG/DL — ABNORMAL HIGH (ref 8.5–10.1)
Chloride: 100 mmol/L (ref 98–110)
Creatinine: 1.36 MG/DL — ABNORMAL HIGH (ref 0.70–1.30)
EGFR IF NonAfrican American: 55 mL/min/{1.73_m2} — ABNORMAL LOW (ref >60)
GFR African American: >60 mL/min/{1.73_m2} (ref >60)
Glucose: 138 mg/dL — ABNORMAL HIGH (ref 70–100)
Potassium: 4.3 mmol/L (ref 3.5–5.1)
Sodium: 140 mmol/L (ref 136–145)
Total Bilirubin: 0.43 mg/dL (ref 0.20–1.20)
Total Protein: 7 g/dL (ref 6.4–8.2)

## 2018-02-23 LAB — CBC WITH AUTO DIFFERENTIAL
Basophils %: 0 % (ref 0–1.2)
Basophils Absolute: 0 10*3/uL (ref 0.0–0.1)
Eosinophils %: 1 %
Eosinophils Absolute: 0.1 10*3/uL (ref 0.0–0.5)
Granulocyte Absolute Count: 0 10*3/uL (ref 0.00–0.03)
Hematocrit: 48.3 % (ref 40.1–51.0)
Hemoglobin: 16.1 g/dL (ref 13.7–17.5)
Immature Granulocytes: 0 % (ref 0.0–0.4)
Lymphocytes %: 9 %
Lymphocytes Absolute: 0.8 10*3/uL — ABNORMAL LOW (ref 1.2–3.7)
MCH: 30.5 PG (ref 25.6–32.2)
MCHC: 33.3 g/dL (ref 32.2–35.5)
MCV: 91.5 FL (ref 80.0–95.0)
MPV: 10.1 FL (ref 9.4–12.4)
Monocytes %: 8 %
Monocytes Absolute: 0.7 10*3/uL (ref 0.2–0.8)
Neutrophils %: 82 %
Neutrophils Absolute: 7.3 10*3/uL — ABNORMAL HIGH (ref 1.6–6.1)
Platelets: 149 10*3/uL — ABNORMAL LOW (ref 150–400)
RBC: 5.28 M/uL (ref 4.51–5.93)
RDW: 14.5 % — ABNORMAL HIGH (ref 11.6–14.4)
WBC: 9 10*3/uL (ref 4.0–10.0)

## 2018-02-23 LAB — LIPID PANEL W/ REFLEX DIRECT LDL
Chol/HDL Ratio: 2.7 NA (ref 1–3.4)
Cholesterol, Total: 126 MG/DL (ref 0–200)
HDL: 47 MG/DL — ABNORMAL LOW (ref >59)
LDL Calculated: 51 MG/DL (ref <100)
Non HDL Chol. (LDL+VLDL): 79 mg/dL (ref <130)
Triglycerides: 140 MG/DL (ref 0–150)

## 2018-02-23 LAB — VITAMIN B12
Vitamin B-12: 425 pg/mL (ref 211.0–911.0)
Vitamin B12: 425 pg/mL (ref 211.0–911.0)

## 2018-02-23 LAB — HEMOGLOBIN A1C W/EAG
Hemoglobin A1C: 6.6 % — ABNORMAL HIGH (ref 4.2–6.3)
eAG: 143 mg/dL

## 2018-02-23 LAB — FERRITIN
Ferritin: 33.1 ng/mL (ref 26–388)
Ferritin: 33.1 ng/mL (ref 26–388)

## 2018-02-23 LAB — IRON AND TIBC
Iron Saturation: 34 %
Iron: 108 ug/dL (ref 65–175)
TIBC: 315 ug/dL (ref 250–450)

## 2018-02-23 LAB — CBC WITH AUTOMATED DIFF
ABS. BASOPHILS: 0 10*3/uL (ref 0.0–0.1)
ABS. EOSINOPHILS: 0.1 10*3/uL (ref 0.0–0.5)
ABS. IMM. GRANS.: 0 10*3/uL (ref 0.00–0.03)
ABS. LYMPHOCYTES: 0.8 10*3/uL — ABNORMAL LOW (ref 1.2–3.7)
ABS. MONOCYTES: 0.7 10*3/uL (ref 0.2–0.8)
ABS. NEUTROPHILS: 7.3 10*3/uL — ABNORMAL HIGH (ref 1.6–6.1)
BASOPHILS: 0 % (ref 0–1.2)
EOSINOPHILS: 1 %
HCT: 48.3 % (ref 40.1–51.0)
HGB: 16.1 g/dL (ref 13.7–17.5)
IMMATURE GRANULOCYTES: 0 % (ref 0.0–0.4)
LYMPHOCYTES: 9 %
MCH: 30.5 PG (ref 25.6–32.2)
MCHC: 33.3 g/dL (ref 32.2–35.5)
MCV: 91.5 FL (ref 80.0–95.0)
MONOCYTES: 8 %
MPV: 10.1 FL (ref 9.4–12.4)
NEUTROPHILS: 82 %
PLATELET: 149 10*3/uL — ABNORMAL LOW (ref 150–400)
RBC: 5.28 M/uL (ref 4.51–5.93)
RDW: 14.5 % — ABNORMAL HIGH (ref 11.6–14.4)
WBC: 9 10*3/uL (ref 4.0–10.0)

## 2018-02-23 LAB — METABOLIC PANEL, COMPREHENSIVE
A-G Ratio: 1.3 (ref 1.0–3.0)
ALT (SGPT): 30 U/L (ref 16–61)
AST (SGOT): 15 U/L (ref 15–37)
Albumin: 3.9 g/dL (ref 3.4–5.0)
Alk. phosphatase: 113 U/L (ref 45–117)
Anion gap: 6 mmol/L (ref 5–15)
BUN/Creatinine ratio: 21 — ABNORMAL HIGH (ref 12–20)
BUN: 29 MG/DL — ABNORMAL HIGH (ref 7–18)
Bilirubin, total: 0.43 mg/dL (ref 0.20–1.20)
CO2: 34 mmol/L — ABNORMAL HIGH (ref 21–32)
Calcium: 11.4 MG/DL — ABNORMAL HIGH (ref 8.5–10.1)
Chloride: 100 mmol/L (ref 98–110)
Creatinine: 1.36 MG/DL — ABNORMAL HIGH (ref 0.70–1.30)
GFR est AA: >60 mL/min/{1.73_m2} (ref >60)
GFR est non-AA: 55 mL/min/{1.73_m2} — ABNORMAL LOW (ref >60)
Glucose: 138 mg/dL — ABNORMAL HIGH (ref 70–100)
Potassium: 4.3 mmol/L (ref 3.5–5.1)
Protein, total: 7 g/dL (ref 6.4–8.2)
Sodium: 140 mmol/L (ref 136–145)

## 2018-02-23 LAB — LIPID PANEL W/ REFLX DIRECT LDL
CHOL/HDL Ratio: 2.7 (ref 1–3.4)
Cholesterol, total: 126 MG/DL (ref 0–200)
HDL Cholesterol: 47 MG/DL — ABNORMAL LOW (ref >59)
LDL, calculated: 51 MG/DL (ref <100)
N-HDL-C: 79 mg/dL (ref <130)
Triglyceride: 140 MG/DL (ref 0–150)

## 2018-02-23 LAB — HEMOGLOBIN A1C WITH EAG
Est. average glucose: 143 mg/dL
Hemoglobin A1c: 6.6 % — ABNORMAL HIGH (ref 4.2–6.3)

## 2018-02-23 LAB — IRON PROFILE
Iron % saturation: 34 %
Iron: 108 ug/dL (ref 65–175)
TIBC: 315 ug/dL (ref 250–450)

## 2018-02-23 MED ORDER — TECHNETIUM TC 99M SULFUR COLLOID
Freq: Once | Status: AC
Start: 2018-02-23 — End: 2018-02-23
  Administered 2018-02-23: 12:00:00 via ORAL

## 2018-02-23 NOTE — Progress Notes (Signed)
Patient informed of results on the phone

## 2018-02-27 NOTE — Telephone Encounter (Signed)
Patient RYC

## 2018-02-27 NOTE — Telephone Encounter (Signed)
Pt would like results from recent gastric emptying scan.

## 2018-02-27 NOTE — Telephone Encounter (Signed)
Based on review of the preliminary report, results are essentially normal. There is a extremely marginal delay in emptying of the stomach 4 hr after the procedure. I do not believe this would be clinically significant.  He should continue with advised small volume, low-fat, frequent meals and no additional action is required for the above findings.I will explain the more in detail to him at his follow-up visit.

## 2018-02-27 NOTE — Telephone Encounter (Signed)
Patient calling for recent NM test results, from Dr. Berton Lan. Please call back [phone]

## 2018-02-27 NOTE — Telephone Encounter (Signed)
Lvmcb

## 2018-02-28 NOTE — Telephone Encounter (Signed)
Pt advised of above results. All questions answered.

## 2018-05-23 NOTE — Telephone Encounter (Signed)
Return call received from patient who states he does not have severe abdominal pain as told to the [facility] because he was to embarrassed to tell her that he's had fecal incontinence when he passes gas.  He states he had a normal bowel movement this morning, feels fine, and otherwise, asymptomatic.  Patient has an appointment scheduled with Dr. Berton Lan on Friday, 05/26/18.  Signed By: Cala Bradford, LPN     May 23, 2018

## 2018-05-23 NOTE — Telephone Encounter (Signed)
Patient called in and stated he is severe pain in the middle of his stomach with a lot of gas. Been having pain for 2 days. No other systems

## 2018-05-23 NOTE — Telephone Encounter (Signed)
Left voice mail message returning patient's call.  Signed By: Cala Bradford, LPN     May 23, 2018

## 2018-05-26 ENCOUNTER — Ambulatory Visit: Attending: Internal Medicine | Primary: Family Medicine

## 2018-05-26 ENCOUNTER — Ambulatory Visit
Admit: 2018-05-26 | Discharge: 2018-05-26 | Payer: PRIVATE HEALTH INSURANCE | Attending: Internal Medicine | Primary: Family Medicine

## 2018-05-26 DIAGNOSIS — R151 Fecal smearing: Secondary | ICD-10-CM

## 2018-05-26 NOTE — Progress Notes (Signed)
05/26/18 Follow up: Followed for chronic issues of iron-deficiency anemia, abdominal pain, bowel habit changes. Last seen in the office 10/07/2017 after hospitalization for enteritis and small-bowel obstruction.  Since then he had a CT enterography which showed no evidence of inflammatory bowel disease or neoplasia; and no evidence/resolution of the small-bowel obstruction.  A video capsule study was attempted but the capsule remained within the stomach for the 8 hr duration.  The patient does have risk factors for delayed gastric emptying.  He had a gastric emptying scan done June 2019 which showed a marginal delay at 4:00 a.m..  He had had a prep prior to this exam.  He was also prescribed hyoscyamine for abdominal pain which he developed a week after his negative CT enterography.  This helped his pain.    He is no longer on hyoscyamine.  He has been placed on Bentyl 20 mg 4 times a day.  He has been using 3 times a day.  Attacks of abdominal pain are infrequent.  His only complaint today is a recurrence of anal seepage with the passage of flatus.   This problem resumed about a week ago.  No new medications, diet change stress or anxiety.  He is suffering from an upper respiratory infection.  No recent antibiotics.  ??  Adherent to bowel regimen of MiraLax and moving bowels 3 times a day.   Has not tried the suppository.  He has remained on low-fiber diet since his attack in December 2018.        Having anal seepage but denies diarrhea.  Denies nausea, vomiting, hematemesis or melena.      They are moving to [place] at the end of this month.  ??  ROS:????Has chronic dyspnea on exertion related to COPD.  Presently suffering from her upper respiratory infection.   All other systems reviewed and negative.  ??  Labs:  June 2019-white count 9, hemoglobin 16, hematocrit 48, platelet count 149.  Basic metabolic panel showed a CO2 of 34, blood glucose of  138, BUN of 29 and creatinine of 1.36.  Calcium level 11.4.  GFR 55.  Normal LFTs.  Lipid panel showed a low HDL.  B12 level 425.  A1c 6.6.  Iron studies showed a ferritin of 33.  Saturation of 34%.  Imaging:  Reviewed report of CT enterography from 10/21/2017-  IMPRESSION:  1. DUODENAL DIVERTICULUM.  THE SMALL BOWEL IS OTHERWISE UNREMARKABLE, WITHOUT EVIDENCE OF SEGMENTAL WALL THICKENING OR OBSTRUCTION.  2. EXTENSIVE COLONIC DIVERTICULOSIS.  3. FAT-CONTAINING RIGHT INGUINAL HERNIA.  Reviewed report of nuclear medicine gastric emptying scan-  IMPRESSION:  THERE IS MINIMAL DELAY AFTER 4 HOURS OF GASTRIC EMPTYING.  THE REMAINDER OF THE STUDY IS WITHIN NORMAL LIMITS.  CLINICAL CORRELATION IS RECOMMENDED.      Prior workup:  Labs:   Negative hepatitis B surface antigen, ceruloplasmin normal and AMA negative. Alkaline phosphatase isoenzymes showing bone origin.  Imaging:  Reviewed results of CT scan done as inpatient December 2018.  Records:  Reviewed GI consult note, followup notes and discharge summary from admission December 2018.  1. EGD/colonoscopy 05/30/17: chronic gastritis (H.Pylori negative), gastric nodule, adenomatous duodenal polyp, single adenomatous colon polyp, diverticulosis, and external hemorrhoids. ??Random colon biopsies negative for microscopic colitis. ??Repeat recommended in 3 years.  2. CT abdomen/pelvis 09/25/16: resolution of previously noted fatty infiltration liver, bilateral renal cysts, normal gallbladder appearance, mildly prominent periportal lymph nodes stable since 2/14, scattered subcm retroperitoneal lymph nodes, numerous scattered diverticula, small bilateral inguinal hernias, prostate  enlargement ????  3. CT enterography 01/22/16: colonic diverticulosis, fatty appearing liver, nonspecific edematous change around the gallbladder, bilateral small fat filled inguinal hernias, bilateral renal cysts, coronary artery calcifications, prostate enlargement   4. 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 EGD and colonoscopy 04/12/16: Seven sessile duodenal polyps, mild gastritis, 6 sub cm polyps,??pan diverticulosis, s/p sigmoid resection with normal appearing anastomosis, external hemorrhoids  6. Capsule endoscopy 01/26/16: gastritis, duodenal??non bleeding??AVM and polyps. ??Incomplete/suboptimal exam due to intraluminal contents  ????  ??  Impression:   Acute issue:  1.  Acute enteritis with partial small-bowel obstruction:  Responded to conservative measures.  Completed antibiotics. Angiotensin receptor blocker stopped. CTE showed resolution 10/2017. Could not complete capsule.   Chronic issues:  1. 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 On oral iron. Had??recieved iv iron as well. H&H stable but iron stores low. Follows with Dr. [name]. No overt gi bleeding. Worsened by being on chronic anticoagulation.  2. Adenomatous duodenal polyps:??????Had a single small duodenal adenoma September 2018. ??Have discussed options with patient and recommended a repeat EGD in no greater than 3 years from now. ??Can consider repeat endoscopy next year especially if he has any symptoms.  3. Adenomatous colon polyps- several removed 03/2016??and a single adenoma removed September 2018. ??Prep was suboptimal but sufficient for lesions greater than 6 mm. ??As a compromise have decided on repeat colonoscopy no later than September 2021.  4. 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 ??Reports having had blood work at [facility] showing normal iron stores and CBC. ??Will obtain records. ??No overt GI bleeding.  5. Abnormal CT scan abdomen/ abnormal GB appearance- non specific.??????Has  marginal elevation in alkaline phosphatase but normal GGTP and alkaline phosphatase isoenzymes suggest bone origin.   The alk-phos level has normalized as of June 2019.  ??Pain not consistent with colic. ??Has seen surgery and they recommended against cholecystectomy. ??His upper abdominal pain improved with bowel prep pointing against gallbladder as cause.  6. Anal seepage: Rectoanal agnosia. Had?? improved with new bowel regimen.  ??I suspect constipation/ fecal impaction. Is on miralax and iron. Colonoscopy negative for??IBD/tumor.  7. Fatty liver- NAFLD. Has risk factors.??????Liver panel shows marginal elevation alkaline phosphatase but normal GGTP and alkaline phosphatase isoenzymes show bone origin. ??CT scan done January 2018 showed no evidence of fatty liver per radiologist.  8.  Chronic constipation:  Response to regimen of MiraLax at bedtime and Dulcolax suppository.  Satisfied with symptom control.  No indication for small intestinal bacterial overgrowth testing at this time.  Discussion:????Discussed  holding off on any testing at this time due to [place] impending [place].  Discussed the recurrence of the anal seepage.  Discussed the need to resume dietary fiber and if this does not result in improvement, begin the use of the Dulcolax suppository.  Plan:  1. ??He will continue with MiraLax 17 g at bedtime.   He will resume dietary fiber.  If this fails to improve his anal seepage, he will begin Dulcolax suppository, 1 to be placed into the rectum, after breakfast. ??He will give himself at least 30 min in the restroom.  2. ??hold off on any further testing.  3. ??Reminded that his next EGD and colonoscopy in 3 years/January 2022 unless the patient develops any  upper GI symptoms or blood work shows worsening iron-deficiency and/or anemia.   4.  Considering a resolution of his symptoms, unclear benefit of continuing dicyclomine especially as he is at risk from its anticholinergic side effects     Counseling dominated visit of 25 minutes, with 20 minutes spent in face to face counseling and included discussion of care coordination, management advice, education and various treatment options as outlined above.         Discussed the patient's BMI with him.  The BMI follow up plan is as follows:     dietary management education, guidance, and counseling  encourage exercise  monitor weight  prescribed dietary intake    An After Visit Summary was printed and given to the patient.

## 2018-05-26 NOTE — Progress Notes (Signed)
05/26/18 Follow up: Followed for chronic issues of iron-deficiency anemia, abdominal pain, bowel habit changes. Last seen in the office 10/07/2017 after hospitalization for enteritis and small-bowel obstruction.  Since then he had a CT enterography which showed no evidence of inflammatory bowel disease or neoplasia; and no evidence/resolution of the small-bowel obstruction.  A video capsule study was attempted but the capsule remained within the stomach for the 8 hr duration.  The patient does have risk factors for delayed gastric emptying.  He had a gastric emptying scan done June 2019 which showed a marginal delay at 4:00 a.m..  He had had a prep prior to this exam.  He was also prescribed hyoscyamine for abdominal pain which he developed a week after his negative CT enterography.  This helped his pain.    He is no longer on hyoscyamine.  He has been placed on Bentyl 20 mg 4 times a day.  He has been using 3 times a day.  Attacks of abdominal pain are infrequent.  His only complaint today is a recurrence of anal seepage with the passage of flatus.   This problem resumed about a week ago.  No new medications, diet change stress or anxiety.  He is suffering from an upper respiratory infection.  No recent antibiotics.  ??  Adherent to bowel regimen of MiraLax and moving bowels 3 times a day.   Has not tried the suppository.  He has remained on low-fiber diet since his attack in December 2018.        Having anal seepage but denies diarrhea.  Denies nausea, vomiting, hematemesis or melena.      They are moving to [place] at the end of this month.  ??  ROS:????Has chronic dyspnea on exertion related to COPD.  Presently suffering from her upper respiratory infection.   All other systems reviewed and negative.  ??  Labs:  June 2019-white count 9, hemoglobin 16, hematocrit 48, platelet count 149.  Basic metabolic panel showed a CO2 of 34, blood glucose of 138, BUN of 29 and creatinine of 1.36.  Calcium level 11.4.  GFR  55.  Normal LFTs.  Lipid panel showed a low HDL.  B12 level 425.  A1c 6.6.  Iron studies showed a ferritin of 33.  Saturation of 34%.  Imaging:  Reviewed report of CT enterography from 10/21/2017-  IMPRESSION:  1. DUODENAL DIVERTICULUM.  THE SMALL BOWEL IS OTHERWISE UNREMARKABLE, WITHOUT EVIDENCE OF SEGMENTAL WALL THICKENING OR OBSTRUCTION.  2. EXTENSIVE COLONIC DIVERTICULOSIS.  3. FAT-CONTAINING RIGHT INGUINAL HERNIA.  Reviewed report of nuclear medicine gastric emptying scan-  IMPRESSION:  THERE IS MINIMAL DELAY AFTER 4 HOURS OF GASTRIC EMPTYING.  THE REMAINDER OF THE STUDY IS WITHIN NORMAL LIMITS.  CLINICAL CORRELATION IS RECOMMENDED.      Prior workup:  Labs:   Negative hepatitis B surface antigen, ceruloplasmin normal and AMA negative. Alkaline phosphatase isoenzymes showing bone origin.  Imaging:  Reviewed results of CT scan done as inpatient December 2018.  Records:  Reviewed GI consult note, followup notes and discharge summary from admission December 2018.  1. EGD/colonoscopy 05/30/17: chronic gastritis (H.Pylori negative), gastric nodule, adenomatous duodenal polyp, single adenomatous colon polyp, diverticulosis, and external hemorrhoids. ??Random colon biopsies negative for microscopic colitis. ??Repeat recommended in 3 years.  2. CT abdomen/pelvis 09/25/16: resolution of previously noted fatty infiltration liver, bilateral renal cysts, normal gallbladder appearance, mildly prominent periportal lymph nodes stable since 2/14, scattered subcm retroperitoneal lymph nodes, numerous scattered diverticula, small bilateral inguinal hernias, prostate  enlargement ????  3. CT enterography 01/22/16: colonic diverticulosis, fatty appearing liver, nonspecific edematous change around the gallbladder, bilateral small fat filled inguinal hernias, bilateral renal cysts, coronary artery calcifications, prostate enlargement  4. 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 EGD and colonoscopy 04/12/16: Seven sessile duodenal polyps, mild gastritis, 6 sub cm polyps,??pan diverticulosis, s/p sigmoid resection with normal appearing anastomosis, external hemorrhoids  6. Capsule endoscopy 01/26/16: gastritis, duodenal??non bleeding??AVM and polyps. ??Incomplete/suboptimal exam due to intraluminal contents  ????  ??  Impression:   Acute issue:  1.  Acute enteritis with partial small-bowel obstruction:  Responded to conservative measures.  Completed antibiotics. Angiotensin receptor blocker stopped. CTE showed resolution 10/2017. Could not complete capsule.   Chronic issues:  1. 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 On oral iron. Had??recieved iv iron as well. H&H stable but iron stores low. Follows with Dr. [name]. No overt gi bleeding. Worsened by being on chronic anticoagulation.  2. Adenomatous duodenal polyps:??????Had a single small duodenal adenoma September 2018. ??Have discussed options with patient and recommended a repeat EGD in no greater than 3 years from now. ??Can consider repeat endoscopy next year especially if he has any symptoms.  3. Adenomatous colon polyps- several removed 03/2016??and a single adenoma removed September 2018. ??Prep was suboptimal but sufficient for lesions greater than 6 mm. ??As a compromise have decided on repeat colonoscopy no later than September 2021.  4. 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 ??Reports having had blood work at [facility] showing normal iron stores and CBC. ??Will obtain records. ??No overt GI bleeding.  5. Abnormal CT scan abdomen/ abnormal GB appearance- non specific.??????Has marginal elevation in alkaline phosphatase but normal GGTP and alkaline phosphatase isoenzymes suggest bone origin.    The alk-phos level has normalized as of June 2019.  ??Pain not consistent with colic. ??Has seen surgery and they recommended against cholecystectomy. ??His upper abdominal pain improved with bowel prep pointing against gallbladder as cause.  6. Anal seepage: Rectoanal agnosia. Had?? improved with new bowel regimen.  ??I suspect constipation/ fecal impaction. Is on miralax and iron. Colonoscopy negative for??IBD/tumor.  7. Fatty liver- NAFLD. Has risk factors.??????Liver panel shows marginal elevation alkaline phosphatase but normal GGTP and alkaline phosphatase isoenzymes show bone origin. ??CT scan done January 2018 showed no evidence of fatty liver per radiologist.  8.  Chronic constipation:  Response to regimen of MiraLax at bedtime and Dulcolax suppository.  Satisfied with symptom control.  No indication for small intestinal bacterial overgrowth testing at this time.  Discussion:????Discussed  holding off on any testing at this time due to his impending [place] out of state.  Discussed the recurrence of the anal seepage.  Discussed the need to resume dietary fiber and if this does not result in improvement, begin the use of the Dulcolax suppository.  Plan:  1. ??He will continue with MiraLax 17 g at bedtime.   He will resume dietary fiber.  If this fails to improve his anal seepage, he will begin Dulcolax suppository, 1 to be placed into the rectum, after breakfast. ??He will give himself at least 30 min in the restroom.  2. ??hold off on any further testing.  3. ??Reminded that his next EGD and colonoscopy in 3 years/January 2022 unless the patient develops any  upper GI symptoms or blood work shows worsening iron-deficiency and/or anemia.   4.  Considering a resolution of his symptoms, unclear benefit of continuing dicyclomine especially as he is at risk from its anticholinergic side effects    Counseling dominated visit of 25 minutes, with 20 minutes spent in face to face counseling and included discussion of care  coordination, management advice, education and various treatment options as outlined above.         Discussed the patient's BMI with him.  The BMI follow up plan is as follows:     dietary management education, guidance, and counseling  encourage exercise  monitor weight  prescribed dietary intake    An After Visit Summary was printed and given to the patient.

## 2018-05-26 NOTE — Patient Instructions (Signed)
Body Mass Index: Care Instructions  Your Care Instructions    Body mass index (BMI) can help you see if your weight is raising your risk for health problems. It uses a formula to compare how much you weigh with how tall you are.  ?? A BMI lower than 18.5 is considered underweight.  ?? A BMI between 18.5 and 24.9 is considered healthy.  ?? A BMI between 25 and 29.9 is considered overweight. A BMI of 30 or higher is considered obese.  If your BMI is in the normal range, it means that you have a lower risk for weight-related health problems. If your BMI is in the overweight or obese range, you may be at increased risk for weight-related health problems, such as high blood pressure, heart disease, stroke, arthritis or joint pain, and diabetes. If your BMI is in the underweight range, you may be at increased risk for health problems such as fatigue, lower protection (immunity) against illness, muscle loss, bone loss, hair loss, and hormone problems.  BMI is just one measure of your risk for weight-related health problems. You may be at higher risk for health problems if you are not active, you eat an unhealthy diet, or you drink too much alcohol or use tobacco products.  Follow-up care is a key part of your treatment and safety. Be sure to make and go to all appointments, and call your doctor if you are having problems. It's also a good idea to know your test results and keep a list of the medicines you take.  How can you care for yourself at home?  ?? Practice healthy eating habits. This includes eating plenty of fruits, vegetables, whole grains, lean protein, and low-fat dairy.  ?? If your doctor recommends it, get more exercise. Walking is a good choice. Bit by bit, increase the amount you walk every day. Try for at least 30 minutes on most days of the week.  ?? Do not smoke. Smoking can increase your risk for health problems. If you need help quitting, talk to your doctor about stop-smoking programs and  medicines. These can increase your chances of quitting for good.  ?? Limit alcohol to 2 drinks a day for men and 1 drink a day for women. Too much alcohol can cause health problems.  If you have a BMI higher than 25  ?? Your doctor may do other tests to check your risk for weight-related health problems. This may include measuring the distance around your waist. A waist measurement of more than 40 inches in men or 35 inches in women can increase the risk of weight-related health problems.  ?? Talk with your doctor about steps you can take to stay healthy or improve your health. You may need to make lifestyle changes to lose weight and stay healthy, such as changing your diet and getting regular exercise.  If you have a BMI lower than 18.5  ?? Your doctor may do other tests to check your risk for health problems.  ?? Talk with your doctor about steps you can take to stay healthy or improve your health. You may need to make lifestyle changes to gain or maintain weight and stay healthy, such as getting more healthy foods in your diet and doing exercises to build muscle.  Where can you learn more?  Go to http://www.healthwise.net/[employer].  Enter S176 in the search box to learn more about "Body Mass Index: Care Instructions."  Current as of: June 26, 2015  Content Version: 11.4  ??   2006-2017 [employer]. Care instructions adapted under license by [employer] (which disclaims liability or warranty for this information). If you have questions about a medical condition or this instruction, always ask your healthcare professional. [employer] disclaims any warranty or liability for your use of this information.

## 2018-06-09 ENCOUNTER — Encounter: Attending: Internal Medicine | Primary: Family Medicine

## 2018-06-19 NOTE — Telephone Encounter (Signed)
It is hard to judge what exactly is causing his abdominal cramps and diarrhea.  He may be best served by going to a local urgent care.  He may need an abdominal x-ray and stool tests.  If he has any dicyclomine left over, he can take that in the meantime for abdominal cramps.

## 2018-06-19 NOTE — Telephone Encounter (Signed)
Returned patient's call with Dr. Fara Olden response to his message advising he be seen at a local urgent care facility to be evaluated for any immediate testing.  Patient voiced understanding and is amenable to plan.  Signed By: Cala Bradford, LPN     June 20, 6643

## 2018-06-19 NOTE — Telephone Encounter (Signed)
Patient called in and stated he has been having cramps, diarrhea, last couple of days. He stated he didn't take iron for 10 days. He went back on the iron on Tuesday. Patient is in [place] would like a call to see what he should take

## 2018-06-29 ENCOUNTER — Encounter: Attending: Internal Medicine | Primary: Family Medicine

## 2018-08-29 DIAGNOSIS — Z955 Presence of coronary angioplasty implant and graft: Secondary | ICD-10-CM | POA: Diagnosis not present

## 2018-08-29 DIAGNOSIS — Z23 Encounter for immunization: Secondary | ICD-10-CM | POA: Diagnosis not present

## 2018-08-29 DIAGNOSIS — E119 Type 2 diabetes mellitus without complications: Secondary | ICD-10-CM | POA: Diagnosis not present

## 2018-08-29 DIAGNOSIS — K589 Irritable bowel syndrome without diarrhea: Secondary | ICD-10-CM | POA: Diagnosis not present

## 2018-09-07 ENCOUNTER — Encounter: Payer: Self-pay | Admitting: Cardiology

## 2018-09-07 ENCOUNTER — Ambulatory Visit (INDEPENDENT_AMBULATORY_CARE_PROVIDER_SITE_OTHER): Payer: Medicare Other | Admitting: Cardiology

## 2018-09-07 VITALS — BP 124/82 | HR 63 | Ht 69.0 in | Wt 185.0 lb

## 2018-09-07 DIAGNOSIS — E782 Mixed hyperlipidemia: Secondary | ICD-10-CM | POA: Diagnosis not present

## 2018-09-07 DIAGNOSIS — E088 Diabetes mellitus due to underlying condition with unspecified complications: Secondary | ICD-10-CM

## 2018-09-07 DIAGNOSIS — I4821 Permanent atrial fibrillation: Secondary | ICD-10-CM | POA: Insufficient documentation

## 2018-09-07 DIAGNOSIS — I4891 Unspecified atrial fibrillation: Secondary | ICD-10-CM | POA: Diagnosis not present

## 2018-09-07 DIAGNOSIS — I2510 Atherosclerotic heart disease of native coronary artery without angina pectoris: Secondary | ICD-10-CM | POA: Diagnosis not present

## 2018-09-07 DIAGNOSIS — I10 Essential (primary) hypertension: Secondary | ICD-10-CM | POA: Diagnosis not present

## 2018-09-07 HISTORY — DX: Diabetes mellitus due to underlying condition with unspecified complications: E08.8

## 2018-09-07 HISTORY — DX: Essential (primary) hypertension: I10

## 2018-09-07 HISTORY — DX: Permanent atrial fibrillation: I48.21

## 2018-09-07 HISTORY — DX: Unspecified atrial fibrillation: I48.91

## 2018-09-07 HISTORY — DX: Mixed hyperlipidemia: E78.2

## 2018-09-07 MED ORDER — NITROGLYCERIN 0.4 MG SL SUBL: 0.4000 mg | SUBLINGUAL_TABLET | SUBLINGUAL | 11 refills | Status: DC | PRN

## 2018-09-07 NOTE — Progress Notes (Signed)
Cardiology Office Note:    Date:  09/07/2018   ID:  Juan Valdez, DOB Aug 07, 1946, MRN [id number]  PCP:  Ronita Hipps, MD  Cardiologist:  Jenean Lindau, MD   Referring MD: Ronita Hipps, MD    ASSESSMENT:    1. Atrial fibrillation, unspecified type ([place])   2. Coronary artery disease involving native coronary artery of native heart without angina pectoris   3. Essential hypertension   4. Mixed dyslipidemia   5. Diabetes mellitus due to underlying condition with unspecified complications ([place])    PLAN:    In order of problems listed above:  1. Secondary prevention stressed with the patient.  His blood pressure is stable.  Diet was discussed for dyslipidemia and diabetes mellitus.  Patient walks on a regular basis. 2. We will try to obtain records from his doctor in [place] about his coronary artery disease and atrial fibrillation. 3. I discussed with the patient atrial fibrillation, disease process. Management and therapy including rate and rhythm control, anticoagulation benefits and potential risks were discussed extensively with the patient. Patient had multiple questions which were answered to patient's satisfaction. 4. He will be back in the next few days for blood work has not had that for the past several months. 5. Echocardiogram will be done to assess murmur heard on auscultation. 6. Patient was advised to walk at least half an hour a day at least 5 days a week and he agrees 7. Patient will be seen in follow-up appointment in 6 months or earlier if the patient has any concerns 8. Sublingual nitroglycerin prescription was sent, its protocol and 911 protocol explained and the patient vocalized understanding questions were answered to the patient's satisfaction   Medication Adjustments/Labs and Tests Ordered: Current medicines are reviewed at length with the patient today.  Concerns regarding medicines are outlined above.  No orders of the defined types were  placed in this encounter.  No orders of the defined types were placed in this encounter.    History of Present Illness:    Juan Valdez is a 72 y.o. male who is being seen today for the evaluation of coronary artery disease and atrial fibrillation to get established at the request of Ronita Hipps, MD.  He is a pleasant 72 year old male with past medical history of coronary artery disease post stenting in the remote past, atrial fibrillation, essential hypertension, dyslipidemia and diabetes mellitus.  He has moved to this area from [place] and wants to get established.  He denies any chest pain orthopnea or PND.  At the time of my evaluation, the patient is alert awake oriented and in no distress.  He is on anticoagulation and is tolerating it well.   Past Medical History:  Diagnosis Date  . Anemia   . Atrial fibrillation ([place]) 09/07/2018  . Atrial fibrillation ([place])   . Diabetes ([place])   . Gout   . Lung cancer [facility])     Past Surgical History:  Procedure Laterality Date  . ABDOMINAL SURGERY    . ROTATOR CUFF REPAIR      Current Medications: Current Meds  Medication Sig  . allopurinol (ZYLOPRIM) 300 MG tablet Take 300 mg by mouth daily.  Marland Kitchen apixaban (ELIQUIS) 5 MG TABS tablet Take 5 mg by mouth 2 (two) times daily.  Marland Kitchen atorvastatin (LIPITOR) 40 MG tablet Take 40 mg by mouth daily.  . budesonide-formoterol (SYMBICORT) 160-4.5 MCG/ACT inhaler Inhale 2 puffs into the lungs 2 (two) times daily.  Marland Kitchen  dicyclomine (BENTYL) 20 MG tablet Take 20 mg by mouth every 6 (six) hours.  Marland Kitchen ipratropium (ATROVENT) 0.03 % nasal spray Place 2 sprays into both nostrils every 12 (twelve) hours.  . metFORMIN (GLUMETZA) 500 MG (MOD) 24 hr tablet Take 500 mg by mouth 2 (two) times daily.  . metoprolol succinate (TOPROL-XL) 25 MG 24 hr tablet Take 25 mg by mouth daily.  Marland Kitchen omeprazole (PRILOSEC) 40 MG capsule Take 40 mg by mouth 2 (two) times daily.  . tamsulosin (FLOMAX) 0.4 MG CAPS capsule Take 0.4  mg by mouth.  . tiotropium (SPIRIVA HANDIHALER) 18 MCG inhalation capsule Place 18 mcg into inhaler and inhale daily.  . traZODone (DESYREL) 100 MG tablet Take 100 mg by mouth at bedtime.     Allergies:   Patient has no known allergies.   Social History   Socioeconomic History  . Marital status: Unknown    Spouse name: Not on file  . Number of children: Not on file  . Years of education: Not on file  . Highest education level: Not on file  Occupational History  . Not on file  Social Needs  . Financial resource strain: Not on file  . Food insecurity:    Worry: Not on file    Inability: Not on file  . Transportation needs:    Medical: Not on file    Non-medical: Not on file  Tobacco Use  . Smoking status: Former Research scientist (life sciences)  . Smokeless tobacco: Never Used  Substance and Sexual Activity  . Alcohol use: Not on file  . Drug use: Not on file  . Sexual activity: Not on file  Lifestyle  . Physical activity:    Days per week: Not on file    Minutes per session: Not on file  . Stress: Not on file  Relationships  . Social connections:    Talks on phone: Not on file    Gets together: Not on file    Attends religious service: Not on file    Active member of club or organization: Not on file    Attends meetings of clubs or organizations: Not on file    Relationship status: Not on file  Other Topics Concern  . Not on file  Social History Narrative  . Not on file     Family History: The patient's family history is not on file.  ROS:   Please see the history of present illness.    All other systems reviewed and are negative.  EKGs/Labs/Other Studies Reviewed:    The following studies were reviewed today: EKG revealed atrial fibrillation with well-controlled ventricular rate.   Recent Labs: No results found for requested labs within last 8760 hours.  Recent Lipid Panel No results found for: CHOL, TRIG, HDL, CHOLHDL, VLDL, LDLCALC, LDLDIRECT  Physical Exam:    VS:  BP  124/82 (BP Location: Right Arm, Patient Position: Sitting, Cuff Size: Normal)   Pulse 63   Ht 5\' 9"  (1.753 m)   Wt 185 lb (83.9 kg)   SpO2 98%   BMI 27.32 kg/m     Wt Readings from Last 3 Encounters:  09/07/18 185 lb (83.9 kg)     GEN: Patient is in no acute distress HEENT: Normal NECK: No JVD; No carotid bruits LYMPHATICS: No lymphadenopathy CARDIAC: S1 S2 regular, 2/6 systolic murmur at the apex. RESPIRATORY:  Clear to auscultation without rales, wheezing or rhonchi  ABDOMEN: Soft, non-tender, non-distended MUSCULOSKELETAL:  No edema; No deformity  SKIN: Warm and dry  NEUROLOGIC:  Alert and oriented x 3 PSYCHIATRIC:  Normal affect    Signed, Jenean Lindau, MD  09/07/2018 11:43 AM    [place]

## 2018-09-07 NOTE — Patient Instructions (Signed)
Medication Instructions:  Your physician has recommended you make the following change in your medication:  Start as needed for chest pain: Nitroglycerin 0.4 mg sublingual (under your tongue) as needed for chest pain. If experiencing chest pain, stop what you are doing and sit down. Take 1 nitroglycerin and wait 5 minutes. If chest pain continues, take another nitroglycerin and wait 5 minutes. If chest pain does not subside, take 1 more nitroglycerin and dial 911. You make take a total of 3 nitroglycerin in a 15 minute time frame.  If you need a refill on your cardiac medications before your next appointment, please call your pharmacy.   Lab work: Your physician recommends that you return for lab work tomorrow: Bmp, cbc, tsh, lft, and lipids.  If you have labs (blood work) drawn today and your tests are completely normal, you will receive your results only by: Marland Kitchen MyChart Message (if you have MyChart) OR . A paper copy in the mail If you have any lab test that is abnormal or we need to change your treatment, we will call you to review the results.  Testing/Procedures: Your physician has requested that you have an echocardiogram. Echocardiography is a painless test that uses sound waves to create images of your heart. It provides your doctor with information about the size and shape of your heart and how well your heart's chambers and valves are working. This procedure takes approximately one hour. There are no restrictions for this procedure.    Follow-Up: At [facility], you and your health needs are our priority.  As part of our continuing mission to provide you with exceptional heart care, we have created designated Provider Care Teams.  These Care Teams include your primary Cardiologist (physician) and Advanced Practice Providers (APPs -  Physician Assistants and Nurse Practitioners) who all work together to provide you with the care you need, when you need it. You will need a follow up  appointment in 6 months.  Please call our office 2 months in advance to schedule this appointment.  You may see No primary care provider on file. or another member of our [employer] Provider Team in [place]: Shirlee More, MD . Jyl Heinz, MD  Any Other Special Instructions Will Be Listed Below (If Applicable).  Nitroglycerin sublingual tablets What is this medicine? NITROGLYCERIN (nye troe GLI ser in) is a type of vasodilator. It relaxes blood vessels, increasing the blood and oxygen supply to your heart. This medicine is used to relieve chest pain caused by angina. It is also used to prevent chest pain before activities like climbing stairs, going outdoors in cold weather, or sexual activity. This medicine may be used for other purposes; ask your health care provider or pharmacist if you have questions. COMMON BRAND NAME(S): Nitroquick, Nitrostat, Nitrotab What should I tell my health care provider before I take this medicine? They need to know if you have any of these conditions: -anemia -head injury, recent stroke, or bleeding in the brain -liver disease -previous heart attack -an unusual or allergic reaction to nitroglycerin, other medicines, foods, dyes, or preservatives -pregnant or trying to get pregnant -breast-feeding How should I use this medicine? Take this medicine by mouth as needed. At the first sign of an angina attack (chest pain or tightness) place one tablet under your tongue. You can also take this medicine 5 to 10 minutes before an event likely to produce chest pain. Follow the directions on the prescription label. Let the tablet dissolve under the tongue.  Do not swallow whole. Replace the dose if you accidentally swallow it. It will help if your mouth is not dry. Saliva around the tablet will help it to dissolve more quickly. Do not eat or drink, smoke or chew tobacco while a tablet is dissolving. If you are not better within 5 minutes after taking ONE dose of  nitroglycerin, call 9-1-1 immediately to seek emergency medical care. Do not take more than 3 nitroglycerin tablets over 15 minutes. If you take this medicine often to relieve symptoms of angina, your doctor or health care professional may provide you with different instructions to manage your symptoms. If symptoms do not go away after following these instructions, it is important to call 9-1-1 immediately. Do not take more than 3 nitroglycerin tablets over 15 minutes. Talk to your pediatrician regarding the use of this medicine in children. Special care may be needed. Overdosage: If you think you have taken too much of this medicine contact a poison control center or emergency room at once. NOTE: This medicine is only for you. Do not share this medicine with others. What if I miss a dose? This does not apply. This medicine is only used as needed. What may interact with this medicine? Do not take this medicine with any of the following medications: -certain migraine medicines like ergotamine and dihydroergotamine (DHE) -medicines used to treat erectile dysfunction like sildenafil, tadalafil, and vardenafil -riociguat This medicine may also interact with the following medications: -alteplase -aspirin -heparin -medicines for high blood pressure -medicines for mental depression -other medicines used to treat angina -phenothiazines like chlorpromazine, mesoridazine, prochlorperazine, thioridazine This list may not describe all possible interactions. Give your health care provider a list of all the medicines, herbs, non-prescription drugs, or dietary supplements you use. Also tell them if you smoke, drink alcohol, or use illegal drugs. Some items may interact with your medicine. What should I watch for while using this medicine? Tell your doctor or health care professional if you feel your medicine is no longer working. Keep this medicine with you at all times. Sit or lie down when you take your  medicine to prevent falling if you feel dizzy or faint after using it. Try to remain calm. This will help you to feel better faster. If you feel dizzy, take several deep breaths and lie down with your feet propped up, or bend forward with your head resting between your knees. You may get drowsy or dizzy. Do not drive, use machinery, or do anything that needs mental alertness until you know how this drug affects you. Do not stand or sit up quickly, especially if you are an older patient. This reduces the risk of dizzy or fainting spells. Alcohol can make you more drowsy and dizzy. Avoid alcoholic drinks. Do not treat yourself for coughs, colds, or pain while you are taking this medicine without asking your doctor or health care professional for advice. Some ingredients may increase your blood pressure. What side effects may I notice from receiving this medicine? Side effects that you should report to your doctor or health care professional as soon as possible: -blurred vision -dry mouth -skin rash -sweating -the feeling of extreme pressure in the head -unusually weak or tired Side effects that usually do not require medical attention (report to your doctor or health care professional if they continue or are bothersome): -flushing of the face or neck -headache -irregular heartbeat, palpitations -nausea, vomiting This list may not describe all possible side effects. Call your doctor for  medical advice about side effects. You may report side effects to FDA at 1-800-FDA-1088. Where should I keep my medicine? Keep out of the reach of children. Store at room temperature between 20 and 25 degrees C (68 and 77 degrees F). Store in [employer] of [employer]. Protect from light and moisture. Keep tightly closed. Throw away any unused medicine after the expiration date. NOTE: This sheet is a summary. It may not cover all possible information. If you have questions about this medicine, talk to your doctor,  pharmacist, or health care provider.  2019 Elsevier/Gold Standard (2013-06-28 17:57:36)   Echocardiogram An echocardiogram is a procedure that uses painless sound waves (ultrasound) to produce an image of the heart. Images from an echocardiogram can provide important information about:  Signs of coronary artery disease (CAD).  Aneurysm detection. An aneurysm is a weak or damaged part of an artery wall that bulges out from the normal force of blood pumping through the body.  Heart size and shape. Changes in the size or shape of the heart can be associated with certain conditions, including heart failure, aneurysm, and CAD.  Heart muscle function.  Heart valve function.  Signs of a past heart attack.  Fluid buildup around the heart.  Thickening of the heart muscle.  A tumor or infectious growth around the heart valves. Tell a health care provider about:  Any allergies you have.  All medicines you are taking, including vitamins, herbs, eye drops, creams, and over-the-counter medicines.  Any blood disorders you have.  Any surgeries you have had.  Any medical conditions you have.  Whether you are pregnant or may be pregnant. What are the risks? Generally, this is a safe procedure. However, problems may occur, including:  Allergic reaction to dye (contrast) that may be used during the procedure. What happens before the procedure? No specific preparation is needed. You may eat and drink normally. What happens during the procedure?   An IV tube may be inserted into one of your veins.  You may receive contrast through this tube. A contrast is an injection that improves the quality of the pictures from your heart.  A gel will be applied to your chest.  A wand-like tool (transducer) will be moved over your chest. The gel will help to transmit the sound waves from the transducer.  The sound waves will harmlessly bounce off of your heart to allow the heart images to be  captured in real-time motion. The images will be recorded on a computer. The procedure may vary among health care providers and hospitals. What happens after the procedure?  You may return to your normal, everyday life, including diet, activities, and medicines, unless your health care provider tells you not to do that. Summary  An echocardiogram is a procedure that uses painless sound waves (ultrasound) to produce an image of the heart.  Images from an echocardiogram can provide important information about the size and shape of your heart, heart muscle function, heart valve function, and fluid buildup around your heart.  You do not need to do anything to prepare before this procedure. You may eat and drink normally.  After the echocardiogram is completed, you may return to your normal, everyday life, unless your health care provider tells you not to do that. This information is not intended to replace advice given to you by your health care provider. Make sure you discuss any questions you have with your health care provider. Document Released: 08/27/2000 Document Revised: 10/02/2016 Document Reviewed: 10/02/2016 Elsevier  Interactive Patient Education  2019 [place] physician has recommended you make the following change in your medication: *

## 2018-09-08 DIAGNOSIS — I2510 Atherosclerotic heart disease of native coronary artery without angina pectoris: Secondary | ICD-10-CM | POA: Diagnosis not present

## 2018-09-08 DIAGNOSIS — I10 Essential (primary) hypertension: Secondary | ICD-10-CM | POA: Diagnosis not present

## 2018-09-08 DIAGNOSIS — E782 Mixed hyperlipidemia: Secondary | ICD-10-CM | POA: Diagnosis not present

## 2018-09-09 LAB — BASIC METABOLIC PANEL
BUN/Creatinine Ratio: 16 (ref 10–24)
BUN: 20 mg/dL (ref 8–27)
CALCIUM: 10 mg/dL (ref 8.6–10.2)
CO2: 27 mmol/L (ref 20–29)
Chloride: 98 mmol/L (ref 96–106)
Creatinine, Ser: 1.26 mg/dL (ref 0.76–1.27)
GFR calc Af Amer: 65 mL/min/{1.73_m2} (ref >59)
GFR calc non Af Amer: 56 mL/min/{1.73_m2} — ABNORMAL LOW (ref >59)
Glucose: 145 mg/dL — ABNORMAL HIGH (ref 65–99)
Potassium: 4.5 mmol/L (ref 3.5–5.2)
Sodium: 141 mmol/L (ref 134–144)

## 2018-09-09 LAB — HEPATIC FUNCTION PANEL
ALT: 17 IU/L (ref 0–44)
AST: 13 IU/L (ref 0–40)
Albumin: 4.1 g/dL (ref 3.5–4.8)
Alkaline Phosphatase: 144 IU/L — ABNORMAL HIGH (ref 39–117)
Bilirubin Total: 0.5 mg/dL (ref 0.0–1.2)
Bilirubin, Direct: 0.16 mg/dL (ref 0.00–0.40)
Total Protein: 6.2 g/dL (ref 6.0–8.5)

## 2018-09-09 LAB — CBC
Hematocrit: 44.9 % (ref 37.5–51.0)
Hemoglobin: 14.6 g/dL (ref 13.0–17.7)
MCH: 28.9 pg (ref 26.6–33.0)
MCHC: 32.5 g/dL (ref 31.5–35.7)
MCV: 89 fL (ref 79–97)
Platelets: 152 10*3/uL (ref 150–450)
RBC: 5.05 x10E6/uL (ref 4.14–5.80)
RDW: 14.4 % (ref 12.3–15.4)
WBC: 8.1 10*3/uL (ref 3.4–10.8)

## 2018-09-09 LAB — LIPID PANEL
Chol/HDL Ratio: 3.2 ratio (ref 0.0–5.0)
Cholesterol, Total: 116 mg/dL (ref 100–199)
HDL: 36 mg/dL — ABNORMAL LOW (ref >39)
LDL CALC: 60 mg/dL (ref 0–99)
Triglycerides: 99 mg/dL (ref 0–149)
VLDL CHOLESTEROL CAL: 20 mg/dL (ref 5–40)

## 2018-09-09 LAB — TSH: TSH: 3.38 u[IU]/mL (ref 0.450–4.500)

## 2018-09-11 ENCOUNTER — Telehealth: Payer: Self-pay

## 2018-09-11 NOTE — Telephone Encounter (Signed)
-----   Message from Mattie Marlin, RN sent at 09/11/2018  9:23 AM EST ----- The results of the study is unremarkable. Please inform patient. I will discuss in detail at next appointment. Cc  primary care/referring physician Jenean Lindau, MD 09/11/2018 8:52 AM

## 2018-09-11 NOTE — Telephone Encounter (Signed)
Patient called and notified of lab results. 

## 2018-10-03 ENCOUNTER — Telehealth: Payer: Self-pay | Admitting: Emergency Medicine

## 2018-10-03 NOTE — Telephone Encounter (Signed)
Called [employer] [facility] and spoke with Izora Gala. She reports now that the medication was denied and we will need to start an appeal for this medication. I reported the phone call from this morning and reported I was told we needed to do a tier exception. However she reports the medication prior authorization was denied. She will fax the information about how to do this. Will await this information

## 2018-10-03 NOTE — Telephone Encounter (Signed)
The number provided is not working. It rings and hangs up. I will contact [employer] and ask for the appropriate number

## 2018-10-03 NOTE — Telephone Encounter (Signed)
Called [employer], I was transferred 3 times and on hold all together for 10 minutes they informed me then that I will need to call [employer] [facility] since this involves a medication. [phone]. I will call them

## 2018-10-03 NOTE — Telephone Encounter (Signed)
These call the insurance company and check with them as to what they need from [place] thank you

## 2018-10-03 NOTE — Telephone Encounter (Signed)
Juan Valdez with [employer] called in informing us that the patient is requesting a tier exception be done for his eliquis to decrease the cost from $47.00 to $10.00. Will route to Dr. Geraldo Pitter. The number to call and request to complete tier exception is [phone]

## 2018-10-04 DIAGNOSIS — D509 Iron deficiency anemia, unspecified: Secondary | ICD-10-CM | POA: Diagnosis not present

## 2018-10-05 NOTE — Telephone Encounter (Signed)
Heather called back. She confirmed the prior auth was denied, I will reach back out again as I haven't received appeal paperwork

## 2018-10-06 NOTE — Telephone Encounter (Signed)
I called appeal line back, requested appeal letter to be faxed again. Will await this fax.

## 2018-10-10 NOTE — Telephone Encounter (Signed)
Called [employer] back again as we still never received fax. I spoke with [place], she will refax again.

## 2018-10-12 NOTE — Telephone Encounter (Signed)
Called patient he reports he isn't having issues with medication being denied. He is able to afford his portion at this time. He will inform us if he has any other issues with medication.

## 2018-10-13 DIAGNOSIS — D509 Iron deficiency anemia, unspecified: Secondary | ICD-10-CM | POA: Diagnosis not present

## 2018-10-18 ENCOUNTER — Ambulatory Visit (INDEPENDENT_AMBULATORY_CARE_PROVIDER_SITE_OTHER): Payer: Medicare Other

## 2018-10-18 DIAGNOSIS — I2510 Atherosclerotic heart disease of native coronary artery without angina pectoris: Secondary | ICD-10-CM | POA: Diagnosis not present

## 2018-10-18 NOTE — Progress Notes (Signed)
Complete echocardiogram has been performed.  Jimmy Shondell Poulson RDCS, RVT 

## 2018-10-19 ENCOUNTER — Telehealth: Payer: Self-pay | Admitting: *Deleted

## 2018-10-19 NOTE — Telephone Encounter (Signed)
Patient informed of echocardiogram results and advised to continue current medications as prescribed. Patient is agreeable to increasing exercise. No further questions.

## 2018-10-19 NOTE — Telephone Encounter (Signed)
Patient returned your call for results

## 2018-10-19 NOTE — Telephone Encounter (Signed)
-----   Message from Jenean Lindau, MD sent at 10/18/2018  3:42 PM EST ----- Ejection fraction is 40 to 45%.  Mild to moderate mitral regurgitation.  No change in therapy at this time.  Encouraged active exercise and will discuss details at next appointment Jenean Lindau, MD 10/18/2018 3:41 PM

## 2018-10-19 NOTE — Telephone Encounter (Signed)
Left message to return call to discuss echocardiogram results.

## 2018-10-26 DIAGNOSIS — M109 Gout, unspecified: Secondary | ICD-10-CM | POA: Diagnosis not present

## 2018-10-26 DIAGNOSIS — E119 Type 2 diabetes mellitus without complications: Secondary | ICD-10-CM | POA: Diagnosis not present

## 2018-10-26 DIAGNOSIS — J449 Chronic obstructive pulmonary disease, unspecified: Secondary | ICD-10-CM | POA: Diagnosis not present

## 2018-10-26 DIAGNOSIS — L602 Onychogryphosis: Secondary | ICD-10-CM | POA: Diagnosis not present

## 2018-10-27 ENCOUNTER — Ambulatory Visit (INDEPENDENT_AMBULATORY_CARE_PROVIDER_SITE_OTHER): Payer: Medicare Other | Admitting: Cardiology

## 2018-10-27 ENCOUNTER — Encounter: Payer: Self-pay | Admitting: Cardiology

## 2018-10-27 VITALS — BP 122/68 | HR 61 | Ht 69.0 in | Wt 189.6 lb

## 2018-10-27 DIAGNOSIS — E088 Diabetes mellitus due to underlying condition with unspecified complications: Secondary | ICD-10-CM

## 2018-10-27 DIAGNOSIS — I4819 Other persistent atrial fibrillation: Secondary | ICD-10-CM

## 2018-10-27 DIAGNOSIS — I10 Essential (primary) hypertension: Secondary | ICD-10-CM

## 2018-10-27 DIAGNOSIS — E782 Mixed hyperlipidemia: Secondary | ICD-10-CM

## 2018-10-27 NOTE — Patient Instructions (Signed)
Medication Instructions:   Your physician recommends that you continue on your current medications as directed. Please refer to the Current Medication list given to you today.   If you need a refill on your cardiac medications before your next appointment, please call your pharmacy.   Lab work:  NONE  If you have labs (blood work) drawn today and your tests are completely normal, you will receive your results only by: Marland Kitchen MyChart Message (if you have MyChart) OR . A paper copy in the mail If you have any lab test that is abnormal or we need to change your treatment, we will call you to review the results.  Testing/Procedures:  Your physician has requested that you have a lexiscan myoview. For further information please visit [email or web address]. Please follow instruction sheet, as given.    Follow-Up: At [facility], you and your health needs are our priority.  As part of our continuing mission to provide you with exceptional heart care, we have created designated Provider Care Teams.  These Care Teams include your primary Cardiologist (physician) and Advanced Practice Providers (APPs -  Physician Assistants and Nurse Practitioners) who all work together to provide you with the care you need, when you need it.   You will need a follow up appointment in 3 months.

## 2018-10-27 NOTE — Addendum Note (Signed)
Addended by: Orland Penman on: 10/27/2018 02:08 PM   Modules accepted: Orders

## 2018-10-27 NOTE — Progress Notes (Signed)
Cardiology Office Note:    Date:  10/27/2018   ID:  Juan Valdez, DOB 1946/06/19, MRN [id number]  PCP:  Ronita Hipps, MD  Cardiologist:  Jenean Lindau, MD   Referring MD: Ronita Hipps, MD    ASSESSMENT:    1. Persistent atrial fibrillation   2. Essential hypertension   3. Diabetes mellitus due to underlying condition with unspecified complications ([place])   4. Mixed dyslipidemia    PLAN:    In order of problems listed above:  1. Primary prevention stressed with the patient.  Importance of compliance with diet and medication stressed and he vocalized understanding.  His blood pressure is stable. 2. I discussed with the patient atrial fibrillation, disease process. Management and therapy including rate and rhythm control, anticoagulation benefits and potential risks were discussed extensively with the patient. Patient had multiple questions which were answered to patient's satisfaction. 3. He will continue current medications and I will do a Lexiscan sestamibi to assess some of his shortness of breath on exertion which is not something new but has happened for the past several months.  He seems to be getting better with that but since he has multiple risk factors for coronary artery disease I would like to rule it out so that evaluation and management is optimized.   Medication Adjustments/Labs and Tests Ordered: Current medicines are reviewed at length with the patient today.  Concerns regarding medicines are outlined above.  No orders of the defined types were placed in this encounter.  No orders of the defined types were placed in this encounter.    Chief Complaint  Patient presents with  . Follow-up     History of Present Illness:    Juan Valdez is a 73 y.o. male.  Patient has atrial fibrillation which is persistent.  Echocardiogram has revealed mild to moderate mitral regurgitation and enlarged left atrium.  Patient is asymptomatic at this time and has  started walking 10 to 15 minutes on a regular basis without any symptoms.  His only problem is that he has orthopedic issues involving his both knees.  His wife is very supportive and accompanies him for the visit.  At the time of my evaluation, the patient is alert awake oriented and in no distress.  Past Medical History:  Diagnosis Date  . Anemia   . Atrial fibrillation ([place]) 09/07/2018  . Atrial fibrillation ([place])   . Diabetes ([place])   . Gout   . Lung cancer [facility])     Past Surgical History:  Procedure Laterality Date  . ABDOMINAL SURGERY    . ROTATOR CUFF REPAIR      Current Medications: Current Meds  Medication Sig  . allopurinol (ZYLOPRIM) 300 MG tablet Take 300 mg by mouth daily.  Marland Kitchen apixaban (ELIQUIS) 5 MG TABS tablet Take 5 mg by mouth 2 (two) times daily.  Marland Kitchen atorvastatin (LIPITOR) 40 MG tablet Take 40 mg by mouth daily.  . budesonide-formoterol (SYMBICORT) 160-4.5 MCG/ACT inhaler Inhale 2 puffs into the lungs 2 (two) times daily.  Marland Kitchen dicyclomine (BENTYL) 20 MG tablet Take 20 mg by mouth every 6 (six) hours.  Marland Kitchen ipratropium (ATROVENT) 0.03 % nasal spray Place 2 sprays into both nostrils every 12 (twelve) hours.  . metFORMIN (GLUMETZA) 500 MG (MOD) 24 hr tablet Take 500 mg by mouth 2 (two) times daily.  . metoprolol succinate (TOPROL-XL) 25 MG 24 hr tablet Take 25 mg by mouth daily.  . nitroGLYCERIN (NITROSTAT) 0.4 MG SL tablet Place  1 tablet (0.4 mg total) under the tongue every 5 (five) minutes as needed for chest pain.  Marland Kitchen omeprazole (PRILOSEC) 40 MG capsule Take 40 mg by mouth 2 (two) times daily.  . tamsulosin (FLOMAX) 0.4 MG CAPS capsule Take 0.4 mg by mouth.  . tiotropium (SPIRIVA HANDIHALER) 18 MCG inhalation capsule Place 18 mcg into inhaler and inhale daily.  . traZODone (DESYREL) 100 MG tablet Take 100 mg by mouth at bedtime.     Allergies:   Patient has no known allergies.   Social History   Socioeconomic History  . Marital status: Unknown    Spouse name: Not  on file  . Number of children: Not on file  . Years of education: Not on file  . Highest education level: Not on file  Occupational History  . Not on file  Social Needs  . Financial resource strain: Not on file  . Food insecurity:    Worry: Not on file    Inability: Not on file  . Transportation needs:    Medical: Not on file    Non-medical: Not on file  Tobacco Use  . Smoking status: Former Research scientist (life sciences)  . Smokeless tobacco: Never Used  Substance and Sexual Activity  . Alcohol use: Not on file  . Drug use: Not on file  . Sexual activity: Not on file  Lifestyle  . Physical activity:    Days per week: Not on file    Minutes per session: Not on file  . Stress: Not on file  Relationships  . Social connections:    Talks on phone: Not on file    Gets together: Not on file    Attends religious service: Not on file    Active member of club or organization: Not on file    Attends meetings of clubs or organizations: Not on file    Relationship status: Not on file  Other Topics Concern  . Not on file  Social History Narrative  . Not on file     Family History: The patient's family history is not on file.  ROS:   Please see the history of present illness.    All other systems reviewed and are negative.  EKGs/Labs/Other Studies Reviewed:    The following studies were reviewed today: Echocardiogram report was discussed with the patient at length.  Procedure: 2D Echo  Indications:    CAD (coronary artery disease) [347425]   History:        Patient has no prior history of Echocardiogram examinations.                 Atrial Fibrillation; Risk Factors: Diabetes, Dyslipidemia and                 Hypertension.   Sonographer:    Luane School Referring Phys: [place]    1. The left ventricle has mildly reduced systolic function of 95-63%. The cavity size is normal. There is moderately increased left ventricular wall thickness. Left ventricular  diastology could not be evaluated secondary to atrial fibrillation.  2. The right ventricle has normal systolic function. The cavity in normal in size. There is no increase in right ventricular wall thickness.  3. Severely dilated left atrial size.  4. Mildly dilated right atrial size.  5. The mitral valve is degenerative There is moderate thickening. Mitral valve regurgitation is mild to moderate by color flow Doppler.  6. The aortic valve is tricuspid. There is mild thickening of the  aortic valve.  7. The aortic root and ascending aorta are normal in size and structure.  8. No evidence of left ventricular regional wall motion abnormalities.    Recent Labs: 09/08/2018: ALT 17; BUN 20; Creatinine, Ser 1.26; Hemoglobin 14.6; Platelets 152; Potassium 4.5; Sodium 141; TSH 3.380  Recent Lipid Panel    Component Value Date/Time   CHOL 116 09/08/2018 0815   TRIG 99 09/08/2018 0815   HDL 36 (L) 09/08/2018 0815   CHOLHDL 3.2 09/08/2018 0815   LDLCALC 60 09/08/2018 0815    Physical Exam:    VS:  BP 122/68   Pulse 61   Ht 5\' 9"  (1.753 m)   Wt 189 lb 9.6 oz (86 kg)   SpO2 97%   BMI 28.00 kg/m     Wt Readings from Last 3 Encounters:  10/27/18 189 lb 9.6 oz (86 kg)  09/07/18 185 lb (83.9 kg)     GEN: Patient is in no acute distress HEENT: Normal NECK: No JVD; No carotid bruits LYMPHATICS: No lymphadenopathy CARDIAC: Hear sounds regular, 2/6 systolic murmur at the apex. RESPIRATORY:  Clear to auscultation without rales, wheezing or rhonchi  ABDOMEN: Soft, non-tender, non-distended MUSCULOSKELETAL:  No edema; No deformity  SKIN: Warm and dry NEUROLOGIC:  Alert and oriented x 3 PSYCHIATRIC:  Normal affect   Signed, Jenean Lindau, MD  10/27/2018 1:52 PM    [facility]

## 2018-11-01 DIAGNOSIS — D509 Iron deficiency anemia, unspecified: Secondary | ICD-10-CM | POA: Diagnosis not present

## 2018-11-10 DIAGNOSIS — M25569 Pain in unspecified knee: Secondary | ICD-10-CM | POA: Diagnosis not present

## 2018-11-14 ENCOUNTER — Institutional Professional Consult (permissible substitution): Payer: Medicare Other | Admitting: Emergency Medicine

## 2018-11-15 ENCOUNTER — Telehealth: Payer: Self-pay | Admitting: *Deleted

## 2018-11-15 NOTE — Telephone Encounter (Signed)
Patient given detailed instructions per Myocardial Perfusion Study Information Sheet for the test on 11/22/18 at 0800. Patient notified to arrive 15 minutes early and that it is imperative to arrive on time for appointment to keep from having the test rescheduled.  If you need to cancel or reschedule your appointment, please call the office within 24 hours of your appointment. . Patient verbalized understanding.Bruce Churilla, [place]anae [place] No mychart

## 2018-11-16 DIAGNOSIS — E119 Type 2 diabetes mellitus without complications: Secondary | ICD-10-CM | POA: Diagnosis not present

## 2018-11-16 DIAGNOSIS — B351 Tinea unguium: Secondary | ICD-10-CM | POA: Insufficient documentation

## 2018-11-16 HISTORY — DX: Tinea unguium: B35.1

## 2018-11-22 ENCOUNTER — Ambulatory Visit: Payer: Medicare Other

## 2018-11-23 ENCOUNTER — Institutional Professional Consult (permissible substitution): Payer: Medicare Other | Admitting: Emergency Medicine

## 2018-11-23 ENCOUNTER — Encounter: Payer: Self-pay | Admitting: Emergency Medicine

## 2018-11-23 ENCOUNTER — Ambulatory Visit: Payer: Medicare Other | Admitting: Emergency Medicine

## 2018-11-23 ENCOUNTER — Other Ambulatory Visit: Payer: Self-pay

## 2018-11-23 DIAGNOSIS — J449 Chronic obstructive pulmonary disease, unspecified: Secondary | ICD-10-CM

## 2018-11-23 DIAGNOSIS — C3491 Malignant neoplasm of unspecified part of right bronchus or lung: Secondary | ICD-10-CM

## 2018-11-23 HISTORY — DX: Malignant neoplasm of unspecified part of right bronchus or lung: C34.91

## 2018-11-23 MED ORDER — BUDESONIDE-FORMOTEROL FUMARATE 160-4.5 MCG/ACT IN AERO: 2.0000 | INHALATION_SPRAY | Freq: Two times a day (BID) | RESPIRATORY_TRACT | 0 refills | Status: DC

## 2018-11-23 NOTE — Assessment & Plan Note (Signed)
Status post right lower lobectomy 2015.  He has had unremarkable serial CT scans for follow-up.  I will get copies of these.  If he has had a full 5 years of surveillance then we can transition him over to low-dose lung cancer screening CTs until age 73.  I will order next time depending on the timing of his follow-up that has been done already in [place].

## 2018-11-23 NOTE — Assessment & Plan Note (Addendum)
He states that he flares about once annually.  Suspect that he will ultimately need to stay on an ICS.  That may make Trelegy the best substitution for his Spiriva, Symbicort.  We will check to see if this is cost effective.  Please continue your Symbicort and your Spiriva as you have been taking them. Keep your albuterol available to use 2 puffs up to every 4 hours if needed for shortness of breath, chest tightness, wheezing. Please obtain a copy of your insurance inhaler formulary.  In particular were interested in in the type of coverage you would receive for inhalers such as Anoro, Stiolto, Trelegy.  Depending on the cost we may decide to make a switch in the future. Please follow up in September or sooner if you have any problems

## 2018-11-23 NOTE — Patient Instructions (Addendum)
Please continue your Symbicort and your Spiriva as you have been taking them. Keep your albuterol available to use 2 puffs up to every 4 hours if needed for shortness of breath, chest tightness, wheezing. Please obtain a copy of your insurance inhaler formulary.  In particular were interested in in the type of coverage you would receive for inhalers such as Anoro, Stiolto, Trelegy.  Depending on the cost we may decide to make a switch in the future. We will plan to repeat your CT scan of the chest without contrast in October 2021 to follow for your history of lung cancer.  If that scan is stable then subsequently you can get a low-dose screening scan until age 71. We will try to obtain copies of your prior CT scans and notes from [place]. Please follow up in September or sooner if you have any problems

## 2018-11-23 NOTE — Progress Notes (Signed)
Subjective:    Patient ID: Juan Valdez, male    DOB: 1945/11/07, 73 y.o.   MRN: [id number]  HPI 73 year old man with a history of tobacco use (100 pack years), atrial fibrillation, CAD, diabetes and COPD.  He also has a history of non-small cell lung cancer, post resection in 2015, RL lobectomy with no XRT or chemo. Last CT was in 06/2018, no recurrence. He is on Spiriva and Symbicort, uses fairly reliably, but cost is an issue. He doesn't always get it. Has doesn't cough, sometimes wheezes esp at night.  Flares about once a year. Minimal allergy sx.     Review of Systems  Constitutional: Negative for fever and unexpected weight change.  HENT: Negative for congestion, dental problem, ear pain, nosebleeds, postnasal drip, rhinorrhea, sinus pressure, sneezing, sore throat and trouble swallowing.   Eyes: Negative for redness and itching.  Respiratory: Positive for shortness of breath. Negative for cough, chest tightness and wheezing.   Cardiovascular: Positive for chest pain. Negative for palpitations and leg swelling.  Gastrointestinal: Positive for abdominal pain. Negative for nausea and vomiting.  Genitourinary: Negative for dysuria.  Musculoskeletal: Negative for joint swelling.  Skin: Negative for rash.  Neurological: Positive for headaches.  Hematological: Does not bruise/bleed easily.  Psychiatric/Behavioral: Negative for dysphoric mood. The patient is not nervous/anxious.     Past Medical History:  Diagnosis Date  . Anemia   . Atrial fibrillation ([place]) 09/07/2018  . Atrial fibrillation ([place])   . Diabetes ([place])   . Gout   . Lung cancer ([place])      Family History  Adopted: Yes     Social History   Socioeconomic History  . Marital status: Unknown    Spouse name: Not on file  . Number of children: Not on file  . Years of education: Not on file  . Highest education level: Not on file  Occupational History  . Not on file  Social Needs  . Financial resource  strain: Not on file  . Food insecurity:    Worry: Not on file    Inability: Not on file  . Transportation needs:    Medical: Not on file    Non-medical: Not on file  Tobacco Use  . Smoking status: Former Smoker    Packs/day: 2.00    Years: 50.00    Pack years: 100.00    Types: Cigarettes    Last attempt to quit: 06/13/2014    Years since quitting: 4.4  . Smokeless tobacco: Never Used  Substance and Sexual Activity  . Alcohol use: Not on file  . Drug use: Not on file  . Sexual activity: Not on file  Lifestyle  . Physical activity:    Days per week: Not on file    Minutes per session: Not on file  . Stress: Not on file  Relationships  . Social connections:    Talks on phone: Not on file    Gets together: Not on file    Attends religious service: Not on file    Active member of club or organization: Not on file    Attends meetings of clubs or organizations: Not on file    Relationship status: Not on file  . Intimate partner violence:    Fear of current or ex partner: Not on file    Emotionally abused: Not on file    Physically abused: Not on file    Forced sexual activity: Not on file  Other Topics Concern  . Not  on file  Social History Narrative  . Not on file  has lived in [place] Has worked as a Multimedia programmer,  Had horses with [place] exposure.    No Known Allergies   Outpatient Medications Prior to Visit  Medication Sig Dispense Refill  . albuterol (VENTOLIN HFA) 108 (90 Base) MCG/ACT inhaler Inhale 2 puffs into the lungs every 6 (six) hours as needed for wheezing or shortness of breath.    . allopurinol (ZYLOPRIM) 300 MG tablet Take 300 mg by mouth daily.    Marland Kitchen apixaban (ELIQUIS) 5 MG TABS tablet Take 5 mg by mouth 2 (two) times daily.    Marland Kitchen atorvastatin (LIPITOR) 40 MG tablet Take 40 mg by mouth daily.    . budesonide-formoterol (SYMBICORT) 160-4.5 MCG/ACT inhaler Inhale 2 puffs into the lungs 2 (two) times daily.    Marland Kitchen dicyclomine (BENTYL) 20 MG tablet Take 20 mg by  mouth every 6 (six) hours.    Marland Kitchen ipratropium (ATROVENT) 0.03 % nasal spray Place 2 sprays into both nostrils every 12 (twelve) hours.    . metFORMIN (GLUMETZA) 500 MG (MOD) 24 hr tablet Take 500 mg by mouth 2 (two) times daily.    . metoprolol succinate (TOPROL-XL) 25 MG 24 hr tablet Take 25 mg by mouth daily.    . nitroGLYCERIN (NITROSTAT) 0.4 MG SL tablet Place 1 tablet (0.4 mg total) under the tongue every 5 (five) minutes as needed for chest pain. 25 tablet 11  . omeprazole (PRILOSEC) 40 MG capsule Take 40 mg by mouth 2 (two) times daily.    . tamsulosin (FLOMAX) 0.4 MG CAPS capsule Take 0.4 mg by mouth.    . tiotropium (SPIRIVA HANDIHALER) 18 MCG inhalation capsule Place 18 mcg into inhaler and inhale daily.    . traMADol (ULTRAM) 50 MG tablet Take 50 mg by mouth every 6 (six) hours as needed.    . traZODone (DESYREL) 100 MG tablet Take 100 mg by mouth at bedtime.     No facility-administered medications prior to visit.         Objective:   Physical Exam Vitals:   11/23/18 1144  BP: 130/74  Pulse: 60  SpO2: 98%  Weight: 187 lb (84.8 kg)  Height: 5\' 9"  (1.753 m)   Gen: Pleasant, well-nourished, in no distress,  normal affect  ENT: No lesions,  mouth clear,  oropharynx clear, no postnasal drip  Neck: No JVD, no stridor  Lungs: No use of accessory muscles, no crackles or wheezing on normal respiration, no wheeze on forced expiration  Cardiovascular: RRR, heart sounds normal, no murmur or gallops, no peripheral edema  Musculoskeletal: No deformities, no cyanosis or clubbing  Neuro: alert, awake, non focal  Skin: Warm, no lesions or rash      Assessment & Plan:  Non-small cell cancer of right lung ([facility]) Status post right lower lobectomy 2015.  He has had unremarkable serial CT scans for follow-up.  I will get copies of these.  If he has had a full 5 years of surveillance then we can transition him over to low-dose lung cancer screening CTs until age 65.  I will order next  time depending on the timing of his follow-up that has been done already in [place].  COPD (chronic obstructive pulmonary disease) ([place]) He states that he flares about once annually.  Suspect that he will ultimately need to stay on an ICS.  That may make Trelegy the best substitution for his Spiriva, Symbicort.  We will check to see  if this is cost effective.  Please continue your Symbicort and your Spiriva as you have been taking them. Keep your albuterol available to use 2 puffs up to every 4 hours if needed for shortness of breath, chest tightness, wheezing. Please obtain a copy of your insurance inhaler formulary.  In particular were interested in in the type of coverage you would receive for inhalers such as Anoro, Stiolto, Trelegy.  Depending on the cost we may decide to make a switch in the future. Please follow up in September or sooner if you have any problems  Baltazar Apo, MD, PhD 11/23/2018, 12:35 PM [facility] [phone] or if no answer [phone]

## 2018-12-07 ENCOUNTER — Telehealth: Payer: Self-pay | Admitting: Cardiology

## 2018-12-07 NOTE — Telephone Encounter (Signed)
Patient is requesting samples of Eliquis.

## 2018-12-07 NOTE — Telephone Encounter (Signed)
Patient scheduled for  Myoview

## 2018-12-12 NOTE — Telephone Encounter (Signed)
Patient requesting additional eliquis samples. RN advised he work on patient assistance packet for future financial need. He was given highlighted packet along with samples today.

## 2018-12-14 ENCOUNTER — Telehealth: Payer: Self-pay | Admitting: Emergency Medicine

## 2018-12-14 MED ORDER — PREDNISONE 10 MG PO TABS: ORAL_TABLET | ORAL | 0 refills | Status: DC

## 2018-12-14 MED ORDER — DOXYCYCLINE HYCLATE 100 MG PO TABS: 100.0000 mg | ORAL_TABLET | Freq: Two times a day (BID) | ORAL | 0 refills | Status: DC

## 2018-12-14 MED ORDER — ALBUTEROL SULFATE HFA 108 (90 BASE) MCG/ACT IN AERS: 2.0000 | INHALATION_SPRAY | Freq: Four times a day (QID) | RESPIRATORY_TRACT | 4 refills | Status: DC | PRN

## 2018-12-14 NOTE — Telephone Encounter (Signed)
Primary Pulmonologist: RB Last office visit and with whom: 11/23/2018 RB What do we see them for (pulmonary problems): Non-small cell cancer of right lung ([place]) Status post right lower lobectomy 2015.  He has had unremarkable serial CT scans for follow-up.  I will get copies of these.  If he has had a full 5 years of surveillance then we can transition him over to low-dose lung cancer screening CTs until age 73.  I will order next time depending on the timing of his follow-up that has been done already in [place].  COPD (chronic obstructive pulmonary disease) ([place]) He states that he flares about once annually.  Suspect that he will ultimately need to stay on an ICS.  That may make Trelegy the best substitution for his Spiriva, Symbicort.  We will check to see if this is cost effective.  Please continue your Symbicort and your Spiriva as you have been taking them. Keep your albuterol available to use 2 puffs up to every 4 hours if needed for shortness of breath, chest tightness, wheezing. Please obtain a copy of your insurance inhaler formulary.  In particular were interested in in the type of coverage you would receive for inhalers such as Anoro, Stiolto, Trelegy.  Depending on the cost we may decide to make a switch in the future. Please follow up in September or sooner if you have any problems   Reason for call: Patient has been increasingly SOB. Over the last two days. Has a hard time  Sleeping SOB gets worse while lying down. No swelling in legs or arms. No chest/head congestion No Fever    In the last month, have you been in contact with someone who was confirmed or suspected to have [place]avirus / COVID-19?  No   Do you have any of the following symptoms developed in the last 30 days? Fever: No Cough: No  Shortness of breath: Yes   When did your symptoms start?  Two days ago.  If the patient has a fever, what is the last reading?  (use n/a if patient denies fever)  No . IF  THE PATIENT STATES THEY DO NOT OWN A THERMOMETER, THEY MUST GO AND PURCHASE ONE When did the fever start?: N/A Have you taken any medication to suppress a fever (ie Ibuprofen, Aleve, Tylenol)?:  Tylenol- For head this is ne symptom for him.  Patient has taking Albuterol nebulizer this helps some.

## 2018-12-14 NOTE — Telephone Encounter (Signed)
Patient is aware of this and would like medication and Tele visit made and My chart code sent. Nothing further needed at this time.

## 2018-12-14 NOTE — Telephone Encounter (Signed)
Can offer:   Doxycycline >>> 1 100 mg tablet every 12 hours for 7 days >>>take with food  >>>wear sunscreen   Prednisone 10mg  tablet  >>>4 tabs for 2 days, then 3 tabs for 2 days, 2 tabs for 2 days, then 1 tab for 2 days, then stop >>>take with food  >>>take in the morning   Please place the order  Continue inhalers as prescribed Continue rescue inhaler as prescribed  Please make sure the patient has a telephonic average visit in 2 to 4 weeks with an APP to ensure symptoms are resolving  Wyn Quaker, FNP

## 2018-12-21 DIAGNOSIS — B349 Viral infection, unspecified: Secondary | ICD-10-CM | POA: Diagnosis not present

## 2018-12-21 DIAGNOSIS — R05 Cough: Secondary | ICD-10-CM | POA: Diagnosis not present

## 2018-12-21 DIAGNOSIS — R509 Fever, unspecified: Secondary | ICD-10-CM | POA: Diagnosis not present

## 2018-12-21 DIAGNOSIS — Z85118 Personal history of other malignant neoplasm of bronchus and lung: Secondary | ICD-10-CM | POA: Diagnosis not present

## 2018-12-21 DIAGNOSIS — I517 Cardiomegaly: Secondary | ICD-10-CM | POA: Diagnosis not present

## 2018-12-21 DIAGNOSIS — R918 Other nonspecific abnormal finding of lung field: Secondary | ICD-10-CM | POA: Diagnosis not present

## 2018-12-21 DIAGNOSIS — R0602 Shortness of breath: Secondary | ICD-10-CM | POA: Diagnosis not present

## 2018-12-21 DIAGNOSIS — J9811 Atelectasis: Secondary | ICD-10-CM | POA: Diagnosis not present

## 2018-12-21 DIAGNOSIS — R069 Unspecified abnormalities of breathing: Secondary | ICD-10-CM | POA: Diagnosis not present

## 2018-12-21 DIAGNOSIS — R51 Headache: Secondary | ICD-10-CM | POA: Diagnosis not present

## 2018-12-21 DIAGNOSIS — R600 Localized edema: Secondary | ICD-10-CM | POA: Diagnosis not present

## 2018-12-27 ENCOUNTER — Telehealth: Payer: Self-pay | Admitting: Cardiology

## 2018-12-27 ENCOUNTER — Telehealth (INDEPENDENT_AMBULATORY_CARE_PROVIDER_SITE_OTHER): Payer: Medicare Other | Admitting: Cardiology

## 2018-12-27 ENCOUNTER — Encounter: Payer: Self-pay | Admitting: Cardiology

## 2018-12-27 ENCOUNTER — Other Ambulatory Visit: Payer: Self-pay

## 2018-12-27 VITALS — BP 149/80 | HR 70 | Ht 69.0 in | Wt 185.0 lb

## 2018-12-27 DIAGNOSIS — I2510 Atherosclerotic heart disease of native coronary artery without angina pectoris: Secondary | ICD-10-CM | POA: Diagnosis not present

## 2018-12-27 DIAGNOSIS — I4819 Other persistent atrial fibrillation: Secondary | ICD-10-CM

## 2018-12-27 DIAGNOSIS — I10 Essential (primary) hypertension: Secondary | ICD-10-CM

## 2018-12-27 DIAGNOSIS — E782 Mixed hyperlipidemia: Secondary | ICD-10-CM

## 2018-12-27 DIAGNOSIS — E088 Diabetes mellitus due to underlying condition with unspecified complications: Secondary | ICD-10-CM

## 2018-12-27 DIAGNOSIS — J431 Panlobular emphysema: Secondary | ICD-10-CM

## 2018-12-27 NOTE — Telephone Encounter (Signed)
Virtual Visit Pre-Appointment Phone Call  Steps For Call:  1. Confirm consent - "In the setting of the current Covid19 crisis, you are scheduled for a (phone or video) visit with your provider on (date) at (time).  Just as we do with many in-office visits, in order for you to participate in this visit, we must obtain consent.  If you'd like, I can send this to your mychart (if signed up) or email for you to review.  Otherwise, I can obtain your verbal consent now.  All virtual visits are billed to your insurance company just like a normal visit would be.  By agreeing to a virtual visit, we'd like you to understand that the technology does not allow for your provider to perform an examination, and thus may limit your provider's ability to fully assess your condition.  Finally, though the technology is pretty good, we cannot assure that it will always work on either your or our end, and in the setting of a video visit, we may have to convert it to a phone-only visit.  In either situation, we cannot ensure that we have a secure connection.  Are you willing to proceed?" STAFF: Did the patient verbally acknowledge consent to telehealth visit? Document YES/NO here: YES  2. Confirm the BEST phone number to call the day of the visit by including in appointment notes  3. Give patient instructions for WebEx/MyChart download to smartphone as below or Doximity/Doxy.me if video visit (depending on what platform provider is using)  4. Advise patient to be prepared with their blood pressure, heart rate, weight, any heart rhythm information, their current medicines, and a piece of paper and pen handy for any instructions they may receive the day of their visit  5. Inform patient they will receive a phone call 15 minutes prior to their appointment time (may be from unknown caller ID) so they should be prepared to answer  6. Confirm that appointment type is correct in Epic appointment notes (VIDEO vs  PHONE)     TELEPHONE CALL NOTE  Juan Valdez has been deemed a candidate for a follow-up tele-health visit to limit community exposure during the Covid-19 pandemic. I spoke with the patient via phone to ensure availability of phone/video source, confirm preferred email & phone number, and discuss instructions and expectations.  I reminded Juan Valdez to be prepared with any vital sign and/or heart rhythm information that could potentially be obtained via home monitoring, at the time of his visit. I reminded Juan Valdez to expect a phone call at the time of his visit if his visit.  [name] 12/27/2018 1:18 PM   INSTRUCTIONS FOR DOWNLOADING THE [place] APP TO SMARTPHONE  - If Apple, ask patient to go to App Store and type in WebEx in the search bar. [place] [employer], the blue/green circle. If Android, go to [employer] and type in [employer] in the search bar. The app is free but as with any other app downloads, their phone may require them to verify saved payment information or Apple/Android password.  - The patient does NOT have to create an account. - On the day of the visit, the assist will walk the patient through joining the meeting with the meeting number/password.  INSTRUCTIONS FOR DOWNLOADING THE MYCHART APP TO SMARTPHONE  - The patient must first make sure to have activated MyChart and know their login information - If Apple, go to [employer] and type in  MyChart in the search bar and download the app. If Android, ask patient to go to [employer] and type in [place] in the search bar and download the app. The app is free but as with any other app downloads, their phone may require them to verify saved payment information or Apple/Android password.  - The patient will need to then log into the app with their MyChart username and password, and select [facility] as their healthcare provider to link the account. When it is time for your visit, go to the  MyChart app, find appointments, and click Begin Video Visit. Be sure to Select Allow for your device to access the Microphone and Camera for your visit. You will then be connected, and your provider will be with you shortly.  **If they have any issues connecting, or need assistance please contact MyChart service desk [phone])**  **If using a computer, in order to ensure the best quality for their visit they will need to use either of the following Internet Browsers: [employer], or Google Chrome**  IF USING DOXIMITY or DOXY.ME - The patient will receive a link just prior to their visit, either by text or email (to be determined day of appointment depending on if it's doxy.me or Doximity).     FULL LENGTH CONSENT FOR TELE-HEALTH VISIT   I hereby voluntarily request, consent and authorize [facility] and its employed or contracted physicians, physician assistants, nurse practitioners or other licensed health care professionals (the Practitioner), to provide me with telemedicine health care services (the Services") as deemed necessary by the treating Practitioner. I acknowledge and consent to receive the Services by the Practitioner via telemedicine. I understand that the telemedicine visit will involve communicating with the Practitioner through live audiovisual communication technology and the disclosure of certain medical information by electronic transmission. I acknowledge that I have been given the opportunity to request an in-person assessment or other available alternative prior to the telemedicine visit and am voluntarily participating in the telemedicine visit.  I understand that I have the right to withhold or withdraw my consent to the use of telemedicine in the course of my care at any time, without affecting my right to future care or treatment, and that the Practitioner or I may terminate the telemedicine visit at any time. I understand that I have the right to  inspect all information obtained and/or recorded in the course of the telemedicine visit and may receive copies of available information for a reasonable fee.  I understand that some of the potential risks of receiving the Services via telemedicine include:   Delay or interruption in medical evaluation due to technological equipment failure or disruption;  Information transmitted may not be sufficient (e.g. poor resolution of images) to allow for appropriate medical decision making by the Practitioner; and/or   In rare instances, security protocols could fail, causing a breach of personal health information.  Furthermore, I acknowledge that it is my responsibility to provide information about my medical history, conditions and care that is complete and accurate to the best of my ability. I acknowledge that Practitioner's advice, recommendations, and/or decision may be based on factors not within their control, such as incomplete or inaccurate data provided by me or distortions of diagnostic images or specimens that may result from electronic transmissions. I understand that the practice of medicine is not an exact science and that Practitioner makes no warranties or guarantees regarding treatment outcomes. I acknowledge that I will receive a copy  of this consent concurrently upon execution via email to the email address I last provided but may also request a printed copy by calling the office of [facility].    I understand that my insurance will be billed for this visit.   I have read or had this consent read to me.  I understand the contents of this consent, which adequately explains the benefits and risks of the Services being provided via telemedicine.   I have been provided ample opportunity to ask questions regarding this consent and the Services and have had my questions answered to my satisfaction.  I give my informed consent for the services to be provided through the use of  telemedicine in my medical care  By participating in this telemedicine visit I agree to the above.

## 2018-12-27 NOTE — Progress Notes (Signed)
Virtual Visit via Video Note   This visit type was conducted due to national recommendations for restrictions regarding the COVID-19 Pandemic (e.g. social distancing) in an effort to limit this patient's exposure and mitigate transmission in our community.  Due to his co-morbid illnesses, this patient is at least at moderate risk for complications without adequate follow up.  This format is felt to be most appropriate for this patient at this time.  All issues noted in this document were discussed and addressed.  A limited physical exam was performed with this format.  Please refer to the patient's chart for his consent to telehealth for [facility].   Evaluation Performed:  Follow-up visit  Date:  12/27/2018   ID:  Juan Valdez, DOB [birth year], MRN [id number]  Patient Location: Home Provider Location: Home  PCP:  Ronita Hipps, MD  Cardiologist:  No primary care provider on file.  Electrophysiologist:  None   Chief Complaint:  DOE  History of Present Illness:    Juan Valdez is a 73 y.o. male with history of essential hypertension and COPD.  He recently went to the hospital with a fever and shortness of breath and was treated and released and told to quarantine himself.  He denies any chest pain orthopnea or PND.  No chest tightness.  His stress test has been postponed because of the viral pandemic.  At the time of my evaluation, the patient is alert awake oriented and in no distress.  The patient does not have symptoms concerning for COVID-19 infection (fever, chills, cough, or new shortness of breath).    Past Medical History:  Diagnosis Date  . Anemia   . Atrial fibrillation ([place]) 09/07/2018  . Atrial fibrillation ([place])   . Diabetes ([place])   . Gout   . Lung cancer [facility])    Past Surgical History:  Procedure Laterality Date  . ABDOMINAL SURGERY    . ROTATOR CUFF REPAIR       Current Meds  Medication Sig  . albuterol (VENTOLIN HFA) 108 (90 Base) MCG/ACT  inhaler Inhale 2 puffs into the lungs every 6 (six) hours as needed for wheezing or shortness of breath.  . allopurinol (ZYLOPRIM) 300 MG tablet Take 300 mg by mouth daily.  Marland Kitchen apixaban (ELIQUIS) 5 MG TABS tablet Take 5 mg by mouth 2 (two) times daily.  Marland Kitchen atorvastatin (LIPITOR) 40 MG tablet Take 40 mg by mouth daily.  . budesonide-formoterol (SYMBICORT) 160-4.5 MCG/ACT inhaler Inhale 2 puffs into the lungs 2 (two) times daily.  Marland Kitchen dicyclomine (BENTYL) 20 MG tablet Take 20 mg by mouth every 6 (six) hours.  Marland Kitchen ipratropium (ATROVENT) 0.03 % nasal spray Place 2 sprays into both nostrils every 12 (twelve) hours.  . metFORMIN (GLUMETZA) 500 MG (MOD) 24 hr tablet Take 500 mg by mouth 2 (two) times daily.  . metoprolol succinate (TOPROL-XL) 25 MG 24 hr tablet Take 25 mg by mouth daily.  Marland Kitchen omeprazole (PRILOSEC) 40 MG capsule Take 40 mg by mouth 2 (two) times daily.  . tamsulosin (FLOMAX) 0.4 MG CAPS capsule Take 0.4 mg by mouth daily after supper.   . traZODone (DESYREL) 100 MG tablet Take 100 mg by mouth at bedtime.  . [DISCONTINUED] metFORMIN (GLUCOPHAGE-XR) 500 MG 24 hr tablet Take 500 mg by mouth daily with breakfast.      Allergies:   Patient has no known allergies.   Social History   Tobacco Use  . Smoking status: Former Smoker    Packs/day: 2.00  Years: 50.00    Pack years: 100.00    Types: Cigarettes    Last attempt to quit: 06/13/2014    Years since quitting: 4.5  . Smokeless tobacco: Never Used  Substance Use Topics  . Alcohol use: Not on file  . Drug use: Not on file     Family Hx: The patient's family history is not on file. He was adopted.  ROS:   Please see the history of present illness.    Patient denies any chest pain orthopnea and only has shortness of breath on exertion which is chronic All other systems reviewed and are negative.   Prior CV studies:   The following studies were reviewed today:  Results of the echocardiogram were discussed with the patient at  length  Labs/Other Tests and Data Reviewed:    EKG:  No ECG reviewed.  Recent Labs: 09/08/2018: ALT 17; BUN 20; Creatinine, Ser 1.26; Hemoglobin 14.6; Platelets 152; Potassium 4.5; Sodium 141; TSH 3.380   Recent Lipid Panel Lab Results  Component Value Date/Time   CHOL 116 09/08/2018 08:15 AM   TRIG 99 09/08/2018 08:15 AM   HDL 36 (L) 09/08/2018 08:15 AM   CHOLHDL 3.2 09/08/2018 08:15 AM   LDLCALC 60 09/08/2018 08:15 AM    Wt Readings from Last 3 Encounters:  12/27/18 185 lb (83.9 kg)  11/23/18 187 lb (84.8 kg)  10/27/18 189 lb 9.6 oz (86 kg)     Objective:    Vital Signs:  BP (!) 149/80 (BP Location: Left Arm, Patient Position: Sitting, Cuff Size: Normal)   Pulse 70   Ht 5\' 9"  (1.753 m)   Wt 185 lb (83.9 kg)   SpO2 94%   BMI 27.32 kg/m    Well nourished, well developed male in no acute distress. Is in no acute distress and appears comfortable at rest  ASSESSMENT & PLAN:    1. Dyspnea on exertion: The patient was recently treated for a febrile illness and told to quarantine himself.  He is under the care of his pulmonologist. 2. Results of the echocardiogram discussed with the patient at extensive length.  He is on anticoagulation. 3. He requests Eliquis samples and I have told him that my office will get in touch with him to give him the samples in a protocol that we have at her office he could receive samples for about 2 weeks or so and then he will have to buy his medications 4. Patient will be seen in follow-up appointment in 6 months or earlier if the patient has any concerns 5. I discussed with the patient atrial fibrillation, disease process. Management and therapy including rate and rhythm control, anticoagulation benefits and potential risks were discussed extensively with the patient. Patient had multiple questions which were answered to patient's satisfaction.   COVID-19 Education: The signs and symptoms of COVID-19 were discussed with the patient and how to  seek care for testing (follow up with PCP or arrange E-visit).  The importance of social distancing was discussed today.  Time:   Today, I have spent 16 minutes with the patient with telehealth technology discussing the above problems.     Medication Adjustments/Labs and Tests Ordered: Current medicines are reviewed at length with the patient today.  Concerns regarding medicines are outlined above.   Tests Ordered: No orders of the defined types were placed in this encounter.   Medication Changes: No orders of the defined types were placed in this encounter.   Disposition:  Follow up 3  months.  Signed, Jenean Lindau, MD  12/27/2018 2:09 PM    [place]

## 2018-12-27 NOTE — Patient Instructions (Signed)
Medication Instructions:  Your physician recommends that you continue on your current medications as directed. Please refer to the Current Medication list given to you today.  If you need a refill on your cardiac medications before your next appointment, please call your pharmacy.   Lab work: None  If you have labs (blood work) drawn today and your tests are completely normal, you will receive your results only by: Marland Kitchen MyChart Message (if you have MyChart) OR . A paper copy in the mail If you have any lab test that is abnormal or we need to change your treatment, we will call you to review the results.  Testing/Procedures: None  Follow-Up: At [facility], you and your health needs are our priority.  As part of our continuing mission to provide you with exceptional heart care, we have created designated Provider Care Teams.  These Care Teams include your primary Cardiologist (physician) and Advanced Practice Providers (APPs -  Physician Assistants and Nurse Practitioners) who all work together to provide you with the care you need, when you need it. You will need a follow up appointment in 3 months.  Any Other Special Instructions Will Be Listed Below (If Applicable).

## 2018-12-28 ENCOUNTER — Ambulatory Visit (INDEPENDENT_AMBULATORY_CARE_PROVIDER_SITE_OTHER): Payer: Medicare Other | Admitting: Acute Care

## 2018-12-28 ENCOUNTER — Other Ambulatory Visit: Payer: Self-pay | Admitting: Acute Care

## 2018-12-28 ENCOUNTER — Ambulatory Visit: Payer: Medicare Other | Admitting: Primary Care

## 2018-12-28 ENCOUNTER — Encounter: Payer: Self-pay | Admitting: Acute Care

## 2018-12-28 DIAGNOSIS — J441 Chronic obstructive pulmonary disease with (acute) exacerbation: Secondary | ICD-10-CM | POA: Diagnosis not present

## 2018-12-28 DIAGNOSIS — R0601 Orthopnea: Secondary | ICD-10-CM

## 2018-12-28 MED ORDER — FUROSEMIDE 20 MG PO TABS: 20.0000 mg | ORAL_TABLET | Freq: Every day | ORAL | 0 refills | Status: DC

## 2018-12-28 NOTE — Progress Notes (Addendum)
Virtual Visit via Video Note  I connected with Juan Valdez on 12/28/18 at 10:00 AM EDT by a video enabled telemedicine application and verified that I am speaking with the correct person using two identifiers.   I discussed the limitations of evaluation and management by telemedicine and the availability of in person appointments. The patient expressed understanding and agreed to proceed.  I  confirmed date of birth and address to authenticate  Identity. My nurse Braulio Conte reviewed medications and ordered any refills required.  I am present in the office and the patient is present at his home.  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 (  full 5 years of surveillance >>  can transition him over to low-dose lung cancer screening CTs until age 60) Maintenance medications: Symbicort and Spiriva Albuterol inhaler as rescue  History of Present Illness: Pt. Presents for a virtual visit. He had called the office 12/14/2018 with complaints of shortness of breath x 2 days. He noted this was worse while laying down. No swelling in legs or arms. No chest/head congestion . No Fever. The office doc of the day called in Doxycycline 100 mg every 12 hours x 7 days, and a prednisone taper . This is a follow up tele visit to ensure that the patient has improved with treatment. He states he did take all medications, but despite treatment he had to go to the ED 12/21/2018.( [place])  He was seen for shortness of breath and  fever of 101.5. Lactate at the time was 2.6 initially, then after one Liter of IVF it was 1.8. His Urine was negative for Nitrites . He was told he had a viral illness.  He was not tested for COVID 19, but was told to go home and self isolate for 14 days.He states he has been doing well. He has no secretions, no cough, no fever. His only complaint  was of shortness of breath when he lies down and new swollen ankles. He does not weigh himself. He does not have a scale at home.  10/18/2018  Echo showed EF of 45-50%, Normal RV systolic function, Severe dilation of LA,. . He is using his Symbicort , and instead of using his Spiriva he was told to use Atrovent nebs 4 times daily until he feels better. ( He states his Spiriva works better for him). He is compliant with this regimen.  He would like to be transitioned to Trelegy once he is better ( Please evaluate at follow up this week). The cost is $47.00 for him which would offer him triple therapy at a lower cost.I told him we can prescribe it once he is better to see if it is effective therapy for him.  He rarely uses his Camera operator. He feels it does not work as well as Ventolin, but Ventolin is cost prohibitive on his insurance plan.  He is quarantined through 4/23, and will need his medications delivered, and he is unable to get a CXR or labs. Last labs 4/9 : Creatinine 1.08, Potassium 4.5, Glucose 204, GFR 67  12/21/2018   138  4.5  101  27  24  204 (H)  1.08  9.6  6.3  4.0  0.6  98  31  41  10  67   WBC 9.4, RDW 16.7, Platelets 133,000, Neutrophils  Absolute 8.6, Lymphocytes Absolute 0.3 12/21/2018   9.4  4.82  14.8  43.3  89.7  30.7  34.2  16.7 (H)  133 (L)  8.2  92  3  4  1   0  8.6 (H)  0.3 (L)  0.4  0.1  0.0    Observations/Objective:  10/18/2018  Echo: EF of 45-50%, Normal RV systolic function, Severe dilation of LA,. Mildly dilated RA, MV regurge and moderate thickening, Mild thickening of a tricuspic  Aortic valve with mild thickening.  12/21/2018: CXR IMPRESSION: Elevated left hemidiaphragm with adjacent atelectasis. Mild cardiomegaly with aortic tortuosity. Central bronchial thickening. No focal airspace disease. No pleural effusion, pneumothorax or pulmonary edema.  Assessment and Plan: COPD Flare 12/14/2018>> resolved  Completed Doxy and Pred  Taper ED visit  for dyspnea and fever 101.5 ( Viral Illness) NO COVID 19 testing  Home with instructions to self quarantine for 14 days. Plan:  We will continue Symbicort 2 puffs twice a day Rinse mouth after use. Continue the  ipratropium (ATROVENT) 0.02 % nebulizer solution  Take 500 mcg by nebulization 4 times daily.  0    ipratropium (ATROVENT) 0.03 % nasal spray  2 sprays by Nasal route every 12 hours.      Pro Air inhaler as needed for shortness of breath or wheezing. We you are better , we will transition you to Trelegy .( evaluate next week at follow up call) Pt. Needs PFT's once available   Concern for  Acute on chronic CHF  EF 45-50% Orthopnea ( Only short of breath when lying down) New Swollen ankles  ? Weight gain ( No home scale) Given 1 L IVF 4/9 in ED Plan: BP was 140/80 yesterday per tele visit with cards Lasix 20 mg once daily x 3 days, take in the morning  Eat a banana every day Low salt diet Consider Pro BNP once out of quarentine Weigh each day when you get a scale Weigh first thing in the morning after your first morning void. Elevate feet when able, consider compression stockings Follow up with cardiology>> Dr. Geraldo Pitter, ? Heart failure      Follow Up Instructions: Follow up televisit 01/04/2019 at 2:30 pm with Wyn Quaker, NP to ensure you are better. We will consider labs at this visit   I discussed the assessment and treatment plan with the patient. The patient was provided an opportunity to ask questions and all were answered. The patient agreed with the plan and demonstrated an understanding of the instructions.   The patient was advised to call back or seek an in-person evaluation if the symptoms worsen or if the condition fails to improve as anticipated.  I provided 60  minutes of non-face-to-face time during this encounter.   Magdalen Spatz, NP 12/28/2018 1:29 PM

## 2018-12-28 NOTE — Patient Instructions (Addendum)
It is nice to talk with you today. We will continue Symbicort 2 puffs twice a day Rinse mouth after use. Continue the  ipratropium (ATROVENT) 0.02 % nebulizer solution  Take 500 mcg by nebulization 4 times daily.  0    ipratropium (ATROVENT) 0.03 % nasal spray  2 sprays by Nasal route every 12 hours.      Pro Air inhaler as needed for shortness of breath or wheezing. We you are better , we will transition you to Trelegy .( evaluate next week at folow up call) Pt. Needs PFT's once we are able to do them Lasix 20 mg once daily x 3 days  Eat banana once daily Weigh each day when you a scale Low salt diet Keep feet elevated when able We will call Lasix in to [place]. [place]>> will need delivery >> patient is quarantined I will sent this OV note to your Cardiologist (Dr. Geraldo Pitter) and let him know you are having shortness of breath when laying down.  Follow up Televisit on 01/04/2019 at 2:30 with Wyn Quaker NP.

## 2018-12-29 ENCOUNTER — Telehealth: Payer: Self-pay | Admitting: *Deleted

## 2018-12-29 NOTE — Telephone Encounter (Signed)
Pt called to see if samples for eliquis are ready for pick up in [place]. Samples were put up front by [facility] and pt knows to go by between 4 and 4:30 and call Adrienne's number when he gets there.

## 2019-01-04 ENCOUNTER — Ambulatory Visit (INDEPENDENT_AMBULATORY_CARE_PROVIDER_SITE_OTHER): Payer: Medicare Other | Admitting: Pulmonary Disease

## 2019-01-04 ENCOUNTER — Other Ambulatory Visit: Payer: Self-pay

## 2019-01-04 ENCOUNTER — Encounter: Payer: Self-pay | Admitting: Pulmonary Disease

## 2019-01-04 DIAGNOSIS — Z79899 Other long term (current) drug therapy: Secondary | ICD-10-CM

## 2019-01-04 DIAGNOSIS — J449 Chronic obstructive pulmonary disease, unspecified: Secondary | ICD-10-CM

## 2019-01-04 DIAGNOSIS — R0601 Orthopnea: Secondary | ICD-10-CM

## 2019-01-04 HISTORY — DX: Other long term (current) drug therapy: Z79.899

## 2019-01-04 HISTORY — DX: Orthopnea: R06.01

## 2019-01-04 NOTE — Patient Instructions (Addendum)
TRIAL of Trelegy Ellipta  >>> 1 puff daily in the morning >>>rinse mouth out after use  >>> This inhaler contains 3 medications that help manage her respiratory status, contact our office if you cannot afford this medication or unable to remain on this medication  STOP SYMBICORT and SPIRIVA when on TRELEGY ELLIPTA   If you like the Trelegy ellipta then call and let us know   DO NOT USE ipratropium when on Trelegy Ellipta   STOP SPIRIVA HANDIHALER when starting Trelegy Ellipita   Only use your albuterol as a rescue medication to be used if you can't catch your breath by resting or doing a relaxed purse lip breathing pattern.  - The less you use it, the better it will work when you need it. - Ok to use up to 2 puffs  every 4 hours if you must but call for immediate appointment if use goes up over your usual need - Don't leave home without it !!  (think of it like the spare tire for your car)     Note your daily symptoms > remember "red flags" for COPD:   >>>Increase in cough >>>increase in sputum production >>>increase in shortness of breath or activity  intolerance.   If you notice these symptoms, please call the office to be seen.     Needs Pulmonary Function Test in July / 2020 - Follow up with Wyn Quaker FNP after    Pt will call cardiology about fluid pill     Patient Education for Congestive Heart Failure  Do the following things EVERY DAY:   1. Weigh yourself EVERY morning after you go to the bathroom but before you eat or drink anything. Write this number down in a weight log/diary.    2. Take your medicines as prescribed. If you have concerns about your medications, please call us before you stop taking them.    3. Eat low salt foods-Limit salt (sodium) to 2000 mg per day. This will help prevent your body from holding onto fluid. Read food labels as many processed foods have a lot of sodium, especially canned goods and prepackaged meats. If you would like some  assistance choosing low sodium foods, we would be happy to set you up with a nutritionist.   4. Stay as active as you can everyday. Staying active will give you more energy and make your muscles stronger. Start with 5 minutes at a time and work your way up to 30 minutes a day. Break up your activities--do some in the morning and some in the afternoon. Start with 3 days per week and work your way up to 5 days as you can.  If you have chest pain, feel short of breath, dizzy, or lightheaded, STOP. If you don't feel better after a short rest, call 911. If you do feel better, call the office to let us know you have symptoms with exercise.   5. Limit all fluids for the day to less than 2 liters. Fluid includes all drinks, coffee, juice, ice chips, soup, jello, and all other liquids.    Return in about 4 weeks (around 02/01/2019), or if symptoms worsen or fail to improve, for Follow up with Wyn Quaker FNP-C.      Coronavirus (COVID-19) Are you at risk?  Are you at risk for the Coronavirus (COVID-19)?  To be considered HIGH RISK for Coronavirus (COVID-19), you have to meet the following criteria:  . Traveled to [place], [place], [place], [place] or [place];  or in the [place] to [place], [place], [place], or [place]; and have fever, cough, and shortness of breath within the last 2 weeks of travel OR . Been in close contact with a person diagnosed with COVID-19 within the last 2 weeks and have fever, cough, and shortness of breath . IF YOU DO NOT MEET THESE CRITERIA, YOU ARE CONSIDERED LOW RISK FOR COVID-19.  What to do if you are HIGH RISK for COVID-19?  Marland Kitchen If you are having a medical emergency, call 911. . Seek medical care right away. Before you go to a doctor's office, urgent care or emergency department, call ahead and tell them about your recent travel, contact with someone diagnosed with COVID-19, and your symptoms. You should receive instructions from your physician's office  regarding next steps of care.  . When you arrive at healthcare provider, tell the healthcare staff immediately you have returned from visiting [place], [place], [place], [place] or [place]; or traveled in the [place] to [place], [place], [place], or [place]; in the last two weeks or you have been in close contact with a person diagnosed with COVID-19 in the last 2 weeks.   . Tell the health care staff about your symptoms: fever, cough and shortness of breath. . After you have been seen by a medical provider, you will be either: o Tested for (COVID-19) and discharged home on quarantine except to seek medical care if symptoms worsen, and asked to  - Stay home and avoid contact with others until you get your results (4-5 days)  - Avoid travel on public transportation if possible (such as bus, train, or airplane) or o Sent to the Emergency Department by EMS for evaluation, COVID-19 testing, and possible admission depending on your condition and test results.  What to do if you are LOW RISK for COVID-19?  Reduce your risk of any infection by using the same precautions used for avoiding the common cold or flu:  Marland Kitchen Wash your hands often with soap and warm water for at least 20 seconds.  If soap and water are not readily available, use an alcohol-based hand sanitizer with at least 60% alcohol.  . If coughing or sneezing, cover your mouth and nose by coughing or sneezing into the elbow areas of your shirt or coat, into a tissue or into your sleeve (not your hands). . Avoid shaking hands with others and consider head nods or verbal greetings only. . Avoid touching your eyes, nose, or mouth with unwashed hands.  . Avoid close contact with people who are sick. . Avoid places or events with large numbers of people in one location, like concerts or sporting events. . Carefully consider travel plans you have or are making. . If you are planning any travel outside or inside the [place], visit the CDC's  Travelers' Health webpage for the latest health notices. . If you have some symptoms but not all symptoms, continue to monitor at home and seek medical attention if your symptoms worsen. . If you are having a medical emergency, call 911.   [place] / e-Visit: [email or web address]         [facility]: [facility]: [phone]                   [facility]: [phone]           It is flu season:   >>>  Best ways to protect herself from the flu: Receive the yearly flu vaccine, practice good hand hygiene washing with soap and also using hand sanitizer when available, eat a nutritious meals, get adequate rest, hydrate appropriately   Please contact the office if your symptoms worsen or you have concerns that you are not improving.   Thank you for choosing [facility] for your healthcare, and for allowing [place] to partner with you on your healthcare journey. I am thankful to be able to provide care to you today.   Wyn Quaker FNP-C

## 2019-01-04 NOTE — Progress Notes (Signed)
Virtual Visit via Telephone Note  I connected with Juan Valdez on 01/04/19 at  2:30 PM EDT by telephone and verified that I am speaking with the correct person using two identifiers.   I discussed the limitations, risks, security and privacy concerns of performing an evaluation and management service by telephone and the availability of in person appointments. I also discussed with the patient that there may be a patient responsible charge related to this service. The patient expressed understanding and agreed to proceed.  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 ( full 5 years of surveillance >>  can transition him over to low-dose lung cancer screening CTs until age 53) Maintenance medications: Symbicort and Spiriva Albuterol inhaler as rescue  Patient consented to consult via telephone: Yes People present and their role in pt care: Pt   Chief complaint: Shortness of breath, COPD, one-week follow-up  73 year old male former smoker (100-pack-year smoking history, quit 2015) initially referred to our office on 11/23/2018 to be established with Dr. Lamonte Sakai.  Patient then proceeded to have a COPD exacerbation and had a tele-visit with Eric Form on 12/28/2018.  At that point time patient had resolved from a COPD exacerbation but was under quarantine for COVID-19 restrictions as he had had fevers as well as shortness of breath and presented to an emergency room (patient's official quarantine ended today-01/04/2019) patient was afebrile in emergency room on assessment.  Patient continues to report that he has not had worsened body aches, chills or fevers while on quarantine.  Patient reports that after Judson Roch treated him with a fluid pill for a few days his lower extremity swelling has improved.  His shortness of breath improved slightly but  then when he restarted his Spiriva HandiHaler he felt that his breathing did lessened.  He feels that he breathes better when he is using the Atrovent nebulized medications.  He does not feel that his rescue inhaler helps.  He is using his rescue inhaler 3-6 times a day.  He believes that personally he does better when he uses Ventolin HFA instead of the pro-air which she has.  His insurance will not pay for the Ventolin HFA.  Patient is interested to know what other options he has as far as for management of his breathing.   Today over the call we discussed the benefits of replacing Spiriva HandiHaler with Yupelri nebulized medication or replacing Symbicort 160 and Spiriva HandiHaler with Trelegy Ellipta.  The patient agreed to the Trelegy Ellipta trial.  MMRC - Breathlessness Score 3 - I stop for breath after walking about 100 yards or after a few minutes on level ground (isle at grocery store is 163ft)    Observations/Objective:  01/03/2019 - Sp02 - 92 percent   01/04/2019 - SP02 - 95 percent   10/18/2018  Echo: EF of 45-50%, Normal RV systolic function, Severe dilation of LA,. Mildly dilated RA, MV regurge and moderate thickening, Mild thickening of a tricuspic  Aortic valve with mild thickening.  12/21/2018: CXR IMPRESSION: Elevated left hemidiaphragm with adjacent atelectasis. Mild cardiomegaly with aortic tortuosity. Central bronchial thickening. No focal airspace disease. No pleural effusion, pneumothorax or pulmonary edema.  No results found for: NITRICOXIDE  Assessment and Plan:  COPD (chronic obstructive pulmonary disease) ([place]) Assessment: Patient needs formal pulmonary function testing 100-pack-year smoking history, quit 2015 Maintained  on Symbicort 160 and Spiriva HandiHaler Patient has concerns regarding Spiriva HandiHaler DPI mMRC 3 Oxygen saturations today are stable at 95%  Plan: Trelegy Ellipta trial-2 weeks sample provided for patient, patient to pick up at our  office in 01/05/2019 [place] for you paperwork to be provided with the Trelegy Ellipta sample Continue rescue inhaler as needed Do not use Atrovent nebs while using Trelegy Ellipta inhaler Stop Symbicort 160 and Spiriva HandiHaler while using Trelegy Ellipta trial Follow-up with our office in 1 week and let us know how you are doing with your breathing 50-month follow-up with pulmonary function testing with Wyn Quaker, FNP  Orthopnea Assessment: Questionable etiology -likely CHF exacerbation /post ED visit with 1 L administered Patient with CHF on 10/18/2018 echocardiogram showing EF of 45 to 50% Symptoms improved while on diuretic Patient is managed by cardiology Dr. Geraldo Pitter  Plan: Patient to contact Dr. Julien Nordmann office and discuss diuretic dosing with them We will defer to cardiology for chronic management of diuretic  Medication management A: Managed on Symbicort 160 and Spiriva Handihaler  ? Unsure if patient can handle a DPI like Spiriva Handihaler   P:  Trial of Trelegy Ellipta  Consider Yupelri and Symbicort 160 if patient fails Trelegy   Follow Up Instructions:  Return in about 3 months (around 04/05/2019), or if symptoms worsen or fail to improve, for Follow up with Wyn Quaker FNP-C, Follow up for PFT.    I discussed the assessment and treatment plan with the patient. The patient was provided an opportunity to ask questions and all were answered. The patient agreed with the plan and demonstrated an understanding of the instructions.   The patient was advised to call back or seek an in-person evaluation if the symptoms worsen or if the condition fails to improve as anticipated.  I provided 39 minutes of non-face-to-face time during this encounter.   Lauraine Rinne, NP

## 2019-01-04 NOTE — Assessment & Plan Note (Signed)
Assessment: Questionable etiology -likely CHF exacerbation /post ED visit with 1 L administered Patient with CHF on 10/18/2018 echocardiogram showing EF of 45 to 50% Symptoms improved while on diuretic Patient is managed by cardiology Dr. Geraldo Pitter  Plan: Patient to contact Dr. Julien Nordmann office and discuss diuretic dosing with them We will defer to cardiology for chronic management of diuretic

## 2019-01-04 NOTE — Assessment & Plan Note (Addendum)
A: Managed on Symbicort 160 and Spiriva Handihaler  ? Unsure if patient can handle a DPI like Spiriva Handihaler   P:  Trial of Trelegy Ellipta  Consider Yupelri and Symbicort 160 if patient fails Trelegy

## 2019-01-04 NOTE — Assessment & Plan Note (Signed)
Assessment: Patient needs formal pulmonary function testing 100-pack-year smoking history, quit 2015 Maintained on Symbicort 160 and Spiriva HandiHaler Patient has concerns regarding Spiriva HandiHaler DPI mMRC 3 Oxygen saturations today are stable at 95%  Plan: Trelegy Ellipta trial-2 weeks sample provided for patient, patient to pick up at our office in 01/05/2019 [place] for you paperwork to be provided with the Trelegy Ellipta sample Continue rescue inhaler as needed Do not use Atrovent nebs while using Trelegy Ellipta inhaler Stop Symbicort 160 and Spiriva HandiHaler while using Trelegy Ellipta trial Follow-up with our office in 1 week and let us know how you are doing with your breathing 2-month follow-up with pulmonary function testing with Wyn Quaker, FNP

## 2019-01-05 ENCOUNTER — Telehealth: Payer: Self-pay | Admitting: Cardiology

## 2019-01-05 NOTE — Telephone Encounter (Signed)
I am completely fine with this idea.  Since I do not know much about this patient I would prefer that her pulmonologist continues with her diuretic therapy because then she will have to be monitored for blood check with potassium and also follow blood pressures.  This is something that even his pulmonologist can take care of at this time since he has already started the process.  Subsequently when the viral pandemic subsides in the next 2 to 3 months I can take over and titrate his medical history.  This is only a suggestion.....Marland Kitchen if the lung doctor is reluctant then I could certainly look into this and resume and take charge of prescribing this therapy also.

## 2019-01-05 NOTE — Telephone Encounter (Signed)
Patient was recently placed on a 3 day lasix trial by his pulmonologist to determine if it would help decrease lower extremity edema. Per patient, last dose was on Wednesday and it reduced the edema quite a bit. He has a hx of COPD and denies feeling like his breathing has improved since pre-diuretic trial. He is on several inhalers. He does not weigh himself and cannot say if he's put on any weight recently or lost weight after lasix trial. VS now are 121/67, HR 71, O2 sat 93% on 2L/[place].    Per patient, pulmonologist wanted to make Cardiology provider aware that patient could potentially benefit from use of a diuretic.    Follow-up visit scheduled with Dr. Lennox Pippins on 03/28/2019.   pls advise, tx

## 2019-01-05 NOTE — Telephone Encounter (Signed)
Patient states his pulmonologist wants him to start taking fluid pills.Please call patient to discuss

## 2019-01-10 ENCOUNTER — Telehealth: Payer: Self-pay | Admitting: Emergency Medicine

## 2019-01-10 DIAGNOSIS — J441 Chronic obstructive pulmonary disease with (acute) exacerbation: Secondary | ICD-10-CM

## 2019-01-10 NOTE — Telephone Encounter (Signed)
Can stick with proair or pay for ventolin out of pocket. Patient could try Xopenex HFA but most insurances do not cover it or require a prior auth.   [name]

## 2019-01-10 NOTE — Telephone Encounter (Signed)
Primary Pulmonologist: RB Last office visit and with whom: 01/04/2019 with Wyn Quaker What do we see them for (pulmonary problems): COPD/lung cancer Last OV assessment/plan: Instructions  Return in about 3 months (around 04/05/2019), or if symptoms worsen or fail to improve, for Follow up with Wyn Quaker FNP-C, Follow up for PFT.  TRIAL of Trelegy Ellipta  >>> 1 puff daily in the morning >>>rinse mouth out after use  >>> This inhaler contains 3 medications that help manage her respiratory status, contact our office if you cannot afford this medication or unable to remain on this medication  STOP SYMBICORT and SPIRIVA when on TRELEGY ELLIPTA   If you like the Trelegy ellipta then call and let us know   DO NOT USE ipratropium when on Trelegy Ellipta   STOP SPIRIVA HANDIHALER when starting Trelegy Ellipita   Only use your albuterol as a rescue medication to be used if you can't catch your breath by resting or doing a relaxed purse lip breathing pattern.  - The less you use it, the better it will work when you need it. - Ok to use up to 2 puffs every 4 hours if you must but call for immediate appointment if use goes up over your usual need - Don't leave home without it !! (think of it like the spare tire for your car)     Note your daily symptoms >remember "red flags" for COPD:  >>>Increase in cough >>>increase in sputum production >>>increase in shortness of breath or activity  intolerance.   If you notice these symptoms, please call the office to be seen.     Needs Pulmonary Function Test in July / 2020 - Follow up with Wyn Quaker FNP after    Pt will call cardiology about fluid pill     Patient Education for Congestive Heart Failure  Do the following things EVERY DAY:  1. Weigh yourself EVERY morning after you go to the bathroom but before you eat or drink anything. Write this number down in a weight log/diary.   2. Take your medicines as prescribed. If  you have concerns about your medications, please call us before you stop taking them.   3. Eat low salt foods-Limit salt (sodium) to 2000 mg per day. This will help prevent your body from holding onto fluid. Read food labels as many processed foods have a lot of sodium, especially canned goods and prepackaged meats. If you would like some assistance choosing low sodium foods, we would be happy to set you up with a nutritionist.  4. Stay as active as you can everyday. Staying active will give you more energy and make your muscles stronger. Start with 5 minutes at a time and work your way up to 30 minutes a day. Break up your activities--do some in the morning and some in the afternoon. Start with 3 days per week and work your way up to 5 days as you can. If you have chest pain, feel short of breath, dizzy, or lightheaded, STOP. If you don't feel better after a short rest, call 911. If you do feel better, call the office to let us know you have symptoms with exercise.  5. Limit all fluids for the day to less than 2 liters. Fluid includes all drinks, coffee, juice, ice chips, soup, jello, and all other liquids.    Return in about 4 weeks (around 02/01/2019), or if symptoms worsen or fail to improve, for Follow up with Wyn Quaker FNP-C.  Was appointment offered to patient (explain)?  No, pt wants inhaler change   Reason for call: Received a call from pt in regards to his Proair inhaler. Pt was switched to Proair due to insurance not covering the Ventolin inhaler which he was previously on. Pt stated when he has to use his Proair inhaler, he feels like he is not getting any medication from the inhaler. Pt states even though the Proair inhaler and Ventolin inhalers are in the same class, he stated the inhalers do not look the same and the opening to where the medication comes out is different on both inhalers.  Pt stated to me he had an old ventolin inhaler from previously being prescribed  and compared it to the proair inhaler and pt stated when he sprayed the ventolin inhaler, he was able to get a better spray from the ventolin than he was able to with the proair.  Due to the ventolin working better for the pt, pt checked with insurance to see how much a 30-day supply of the ventolin would cost since it is not covered by insurance and pt stated it would be roughly $100 for a 30-day supply of ventolin.  Pt is wanting to know if there is a different rescue inhaler that might possibly be able to be prescribed for him other than his current inhaler of proair or the ventolin (different class of rescue inhaler possibly). [name], please advise on this for pt. Thanks!

## 2019-01-11 ENCOUNTER — Ambulatory Visit: Payer: Medicare Other | Admitting: Pulmonary Disease

## 2019-01-11 ENCOUNTER — Other Ambulatory Visit: Payer: Self-pay

## 2019-01-11 ENCOUNTER — Encounter: Payer: Self-pay | Admitting: Cardiology

## 2019-01-11 ENCOUNTER — Telehealth (INDEPENDENT_AMBULATORY_CARE_PROVIDER_SITE_OTHER): Payer: Medicare Other | Admitting: Cardiology

## 2019-01-11 ENCOUNTER — Other Ambulatory Visit: Payer: Self-pay | Admitting: Cardiology

## 2019-01-11 VITALS — BP 127/69 | HR 59 | Ht 69.0 in | Wt 189.0 lb

## 2019-01-11 DIAGNOSIS — E088 Diabetes mellitus due to underlying condition with unspecified complications: Secondary | ICD-10-CM

## 2019-01-11 DIAGNOSIS — I4891 Unspecified atrial fibrillation: Secondary | ICD-10-CM

## 2019-01-11 DIAGNOSIS — E782 Mixed hyperlipidemia: Secondary | ICD-10-CM

## 2019-01-11 DIAGNOSIS — I10 Essential (primary) hypertension: Secondary | ICD-10-CM

## 2019-01-11 DIAGNOSIS — I2510 Atherosclerotic heart disease of native coronary artery without angina pectoris: Secondary | ICD-10-CM

## 2019-01-11 MED ORDER — FLUTICASONE-UMECLIDIN-VILANT 100-62.5-25 MCG/INH IN AEPB: 1.0000 | INHALATION_SPRAY | Freq: Every day | RESPIRATORY_TRACT | 6 refills | Status: DC

## 2019-01-11 MED ORDER — METOPROLOL SUCCINATE ER 25 MG PO TB24: 25.0000 mg | ORAL_TABLET | Freq: Every day | ORAL | 1 refills | Status: DC

## 2019-01-11 NOTE — Telephone Encounter (Signed)
Patient informed that Dr. Geraldo Pitter is fine with him on a diuretic/lasix. He suggested his pulmonologist may want to continue to prescribe and treat that, but if he is reluctant to, to let us know and Dr. Geraldo Pitter will take over this medication management. Patient verbalized no further questions or concerns and will reach out to our office if additional assistance is needed.

## 2019-01-11 NOTE — Telephone Encounter (Signed)
Juan Valdez [birth year] called 01/05/2019 -- Juan Valdez put a note in to [facility]; [facility] responded to [facility] but no one has called this patient back.  Transferred patient to [employer]

## 2019-01-11 NOTE — Telephone Encounter (Signed)
Rx sent for Metoprolol 25mg  one tablet daily sent to [facility] [place] as requested.

## 2019-01-11 NOTE — Progress Notes (Signed)
An error occurred in the note in the plan or rather the documentation was not complete.  Patient will take furosemide 20 mg daily.  Also potassium supplementation in diet was advised.

## 2019-01-11 NOTE — Telephone Encounter (Signed)
Pt is calling back [phone]

## 2019-01-11 NOTE — Telephone Encounter (Signed)
Called the patient and advised him of the response received. The patient stated that he would like to get a refill of the Trelegy that was issued as it has helped his breathing. He asked for the [facility] [place].  Patient also wanted to make [name] aware that his shortness of breath has gotten worse at night.  Message above forwarded to Wyn Quaker, NP at patient request.  [name], please see above regarding patient update. Also, can Trelegy be sent?

## 2019-01-11 NOTE — Telephone Encounter (Signed)
The patient called in and stated he was in contact with Cardiology regarding the usage of Lasix. The patient stated he was told to contact pulmonary regarding the usage as he was given 3 day supply on 12/28/18.   Patient stated that his weight was 175 lbs on 01/10/19 and today it is 189 lbs. Patient stated that although the prescription was sent in on 12/28/18 [facility] gave him a hard time and would not deliver the medication. He was able to pick it up on 12/30/18 and took it on Sunday 12/31/18. [facility] then mailed him the prescription he picked up on 01/01/19. Patient stated he took the Lasix from 12/31/18 to 01/05/19 for a total of six days.   Patient stated that he does not want any of his medications to be sent to [facility]. [facility] [place].  Patient scheduled for 3:30 televisit with Wyn Quaker, NP.  Message above sent to [place] and FYI.  [name], please see above. Thanks.

## 2019-01-11 NOTE — Telephone Encounter (Signed)
Patient phoned asking how Dr. Geraldo Pitter feels about him continuing on lasix

## 2019-01-11 NOTE — Telephone Encounter (Signed)
Yes okay to send prescription for Trelegy Ellipta.  I see the patient contacted cardiology.  Cardiology would like for [place] to manage patient's diuretic chronically.  We will not do that.  Patient needs to be evaluated by cardiology.  Have patient's weights increased?  Does he have increased lower extremity swelling?  Is it harder for him to breathe while lying flat?  If so we could consider another short course of diuretics but chronically this does need to be managed by Dr. Geraldo Pitter his cardiologist or patient's primary care provider.  Wyn Quaker, FNP

## 2019-01-11 NOTE — Telephone Encounter (Signed)
Received call from RN at [facility] office triage nurse. States that patient's weight has increased from 175 # yesterday to #189.7 today and that patient's lower extremity edema has increased. Patient had been scheduled for a televisit at 3:30 with NP in their office based on this but triage RN states this is a cardiology  Issue, not a pulmonary one.    Phoned patient to assess, states that he got a new scale yesterday and as above yesterday's weight 175 # and today's weight 189. As per previous conversation today, he denies lower extremity edema and does report shortness of breath which is not new.     Will inform Dr. Lennox Pippins to follow-up with patient.

## 2019-01-11 NOTE — Telephone Encounter (Signed)
LMTCB

## 2019-01-11 NOTE — Progress Notes (Signed)
Virtual Visit via Video Note   This visit type was conducted due to national recommendations for restrictions regarding the COVID-19 Pandemic (e.g. social distancing) in an effort to limit this patient's exposure and mitigate transmission in our community.  Due to his co-morbid illnesses, this patient is at least at moderate risk for complications without adequate follow up.  This format is felt to be most appropriate for this patient at this time.  All issues noted in this document were discussed and addressed.  A limited physical exam was performed with this format.  Please refer to the patient's chart for his consent to telehealth for [facility].   Evaluation Performed:  Follow-up visit  Date:  01/11/2019   ID:  Juan Valdez, DOB 03/21/1946, MRN [id number]  Patient Location: Home Provider Location: Home  PCP:  Ronita Hipps, MD  Cardiologist:  No primary care provider on file.  Electrophysiologist:  None   Chief Complaint: Dyspnea and orthopnea  History of Present Illness:    Juan Valdez is a 73 y.o. male with his medical history of essential hypertension and mildly reduced left ventricular systolic function.  He also has pulmonary issues and.  He mentions to me that his doctor initiated him on 20 mg of furosemide.  No chest pain orthopnea or PND.  At the time of my evaluation, the patient is alert awake oriented and in no distress.  The patient requests to be initiated on diuretic therapy.  The patient does not have symptoms concerning for COVID-19 infection (fever, chills, cough, or new shortness of breath).    Past Medical History:  Diagnosis Date  . Anemia   . Atrial fibrillation ([place]) 09/07/2018  . Atrial fibrillation ([place])   . Diabetes ([place])   . Gout   . Lung cancer [facility])    Past Surgical History:  Procedure Laterality Date  . ABDOMINAL SURGERY    . ROTATOR CUFF REPAIR       Current Meds  Medication Sig  . albuterol (VENTOLIN HFA) 108 (90 Base)  MCG/ACT inhaler Inhale 2 puffs into the lungs every 6 (six) hours as needed for wheezing or shortness of breath.  . allopurinol (ZYLOPRIM) 300 MG tablet Take 300 mg by mouth daily.  Marland Kitchen apixaban (ELIQUIS) 5 MG TABS tablet Take 5 mg by mouth 2 (two) times daily.  Marland Kitchen atorvastatin (LIPITOR) 40 MG tablet Take 40 mg by mouth daily.  Marland Kitchen dicyclomine (BENTYL) 20 MG tablet Take 20 mg by mouth every 6 (six) hours.  . Fluticasone-Umeclidin-Vilant (TRELEGY ELLIPTA) 100-62.5-25 MCG/INH AEPB Inhale 1 puff into the lungs daily.  Marland Kitchen ipratropium (ATROVENT) 0.03 % nasal spray Place 2 sprays into both nostrils every 12 (twelve) hours.  . metFORMIN (GLUMETZA) 500 MG (MOD) 24 hr tablet Take 500 mg by mouth 2 (two) times daily.  Marland Kitchen omeprazole (PRILOSEC) 40 MG capsule Take 40 mg by mouth 2 (two) times daily.  . tamsulosin (FLOMAX) 0.4 MG CAPS capsule Take 0.4 mg by mouth daily after supper.   . traZODone (DESYREL) 100 MG tablet Take 100 mg by mouth at bedtime.  . [DISCONTINUED] metoprolol succinate (TOPROL-XL) 25 MG 24 hr tablet Take 25 mg by mouth daily.     Allergies:   Patient has no known allergies.   Social History   Tobacco Use  . Smoking status: Former Smoker    Packs/day: 2.00    Years: 50.00    Pack years: 100.00    Types: Cigarettes    Last attempt to  quit: 06/13/2014    Years since quitting: 4.5  . Smokeless tobacco: Never Used  Substance Use Topics  . Alcohol use: Not on file  . Drug use: Not on file     Family Hx: The patient's family history is not on file. He was adopted.  ROS:   Please see the history of present illness.    As mentioned above All other systems reviewed and are negative.   Prior CV studies:   The following studies were reviewed today:  Echocardiogram was reviewed  Labs/Other Tests and Data Reviewed:    EKG:  No ECG reviewed.  Recent Labs: 09/08/2018: ALT 17; BUN 20; Creatinine, Ser 1.26; Hemoglobin 14.6; Platelets 152; Potassium 4.5; Sodium 141; TSH 3.380    Recent Lipid Panel Lab Results  Component Value Date/Time   CHOL 116 09/08/2018 08:15 AM   TRIG 99 09/08/2018 08:15 AM   HDL 36 (L) 09/08/2018 08:15 AM   CHOLHDL 3.2 09/08/2018 08:15 AM   LDLCALC 60 09/08/2018 08:15 AM    Wt Readings from Last 3 Encounters:  01/11/19 189 lb (85.7 kg)  12/27/18 185 lb (83.9 kg)  11/23/18 187 lb (84.8 kg)     Objective:    Vital Signs:  BP 127/69 (BP Location: Left Arm, Patient Position: Sitting, Cuff Size: Normal)   Pulse (!) 59   Ht 5\' 9"  (1.753 m)   Wt 189 lb (85.7 kg)   SpO2 93%   BMI 27.91 kg/m    VITAL SIGNS:  reviewed  ASSESSMENT & PLAN:    1. Dyspnea on exertion and orthopnea: The patient has tolerated furosemide well and has felt significant relief with that so I will initiate him on furosemide 20 will be back in the next 8 to 10 days to our office for a Chem-7.  I will be seen in follow-up appointment in 2 weeks..  Overall he is comfortable and has no significant issues affecting his quality of life. 2. I discussed with the patient atrial fibrillation, disease process. Management and therapy including rate and rhythm control, anticoagulation benefits and potential risks were discussed extensively with the patient. Patient had multiple questions which were answered to patient's satisfaction.   COVID-19 Education: The signs and symptoms of COVID-19 were discussed with the patient and how to seek care for testing (follow up with PCP or arrange E-visit).  15 the importance of social distancing was discussed today.  Time:   Today, I have spent 15 minutes with the patient with telehealth technology discussing the above problems.     Medication Adjustments/Labs and Tests Ordered: Current medicines are reviewed at length with the patient today.  Concerns regarding medicines are outlined above.   Tests Ordered: No orders of the defined types were placed in this encounter.   Medication Changes: No orders of the defined types were  placed in this encounter.   Disposition:  Follow up in 2 week(s)  Signed, Jenean Lindau, MD  01/11/2019 4:42 PM    [facility]

## 2019-01-11 NOTE — Telephone Encounter (Signed)
Thank you for your work on this.   [name]

## 2019-01-11 NOTE — Telephone Encounter (Signed)
Patient completed tele-visit with cardiology today.  Furosemide 20 mg was initiated.Nothing further is neededBrian Warner Mccreedy, FNP

## 2019-01-11 NOTE — Telephone Encounter (Signed)
Was in communication with Polly Cobia by skype and she stated the patient did not mention to them any communication with his PCP.  Advised of his weight gain and take a look at the note from my conversation with the patient. She stated the information will be forwarded to Dr. Hali Marry nurse, the information below from skype conversation.  "[01/11/2019 2:41 PM]  Polly Cobia:   He did not mention talking to his PCP at all. I'll forward this to Dr. Julien Nordmann nurse and we will follow up with the patient."  Televisit for 01/11/19 cancelled as the patient will be contacted by cardiology regarding lasix usage to manage edema.  Nothing further needed at this time.

## 2019-01-11 NOTE — Telephone Encounter (Signed)
Has the patient been contacted primary care regarding the symptoms?  With that significant amount of weight gain a telephonic office visit is hardly appropriate.  Of course we can empirically treat him with diuretics as the patient needs blood work and needs to be evaluated.  Has he contacted primary care?  Is cardiology aware that he is had that significant of a weight increase?    Just because another NP in this office gave a short course of diuretics does not mean that we will chronically manage this problem  Wyn Quaker, FNP

## 2019-01-11 NOTE — Telephone Encounter (Signed)
°*  STAT* If patient is at the pharmacy, call can be transferred to refill team.   1. Which medications need to be refilled? (please list name of each medication and dose if known)  metoprolol succinate (TOPROL-XL) 25 MG 24 hr tablet   2. Which pharmacy/location (including street and city if local pharmacy) is medication to be sent to?  [id number] - [place], [place] - [address] [phone] (Phone) [phone] (Fax)    3. Do they need a 30 day or 90 day supply? 90 day

## 2019-01-12 ENCOUNTER — Other Ambulatory Visit: Payer: Self-pay

## 2019-01-12 ENCOUNTER — Telehealth: Payer: Self-pay | Admitting: Emergency Medicine

## 2019-01-12 ENCOUNTER — Other Ambulatory Visit: Payer: Self-pay | Admitting: Cardiology

## 2019-01-12 MED ORDER — FUROSEMIDE 20 MG PO TABS: 20.0000 mg | ORAL_TABLET | Freq: Every day | ORAL | 0 refills | Status: DC

## 2019-01-12 MED ORDER — APIXABAN 5 MG PO TABS: 5.0000 mg | ORAL_TABLET | Freq: Two times a day (BID) | ORAL | 1 refills | Status: DC

## 2019-01-12 NOTE — Telephone Encounter (Signed)
Ref[facility]ide [facility] to [facility] [place] as requested.  

## 2019-01-12 NOTE — Addendum Note (Signed)
Addended by: Beckey Rutter on: 01/12/2019 08:39 AM   Modules accepted: Orders

## 2019-01-12 NOTE — Telephone Encounter (Signed)
°*  STAT* If patient is at the pharmacy, call can be transferred to refill team.   1. Which medications need to be refilled? (please list name of each medication and dose if known) Furosemide 20mg  tablet once daily  2. Which pharmacy/location (including street and city if local pharmacy) is medication to [facility] sent to?[facility] [place]  3. Do they need a 30 day or 90 day supply? [name]

## 2019-01-12 NOTE — Patient Instructions (Signed)
Medication Instructions:  Your physician recommends that you continue on your current medications as directed. Please refer to the Current Medication list given to you today.  If you need a refill on your cardiac medications before your next appointment, please call your pharmacy.   Lab work: NONE today, patient will come into office on 01/18/19 for BMP.  If you have labs (blood work) drawn today and your tests are completely normal, you will receive your results only by: Marland Kitchen MyChart Message (if you have MyChart) OR . A paper copy in the mail If you have any lab test that is abnormal or we need to change your treatment, we will call you to review the results.  Testing/Procedures: NONE  Follow-Up: At [facility], you and your health needs are our priority.  As part of our continuing mission to provide you with exceptional heart care, we have created designated Provider Care Teams.  These Care Teams include your primary Cardiologist (physician) and Advanced Practice Providers (APPs -  Physician Assistants and Nurse Practitioners) who all work together to provide you with the care you need, when you need it. You will need a follow up appointment in 2 weeks.

## 2019-01-12 NOTE — Telephone Encounter (Signed)
Called the patient back and he stated he did pick up the Trelegy prescription sent yesterday, picked up a 90 day supply, but will finish using the Symbicort and Spiriva first. Patient was directed by cardiology to take the Lasix at 3:00 in the afternoon to see if that will help with the fluid retention so he does not increase risk of falling by having get up so many times during the night.  Patient stated he sleeps elevated but feels due to the fluid he was having the difficulty breathing. Patient said he did get relief when he uses the Trelegy in the morning. Suggested that he try taking it in the evening instead to see if there is more improvement in breathing at night.  Patient said he will use the Lasix as directed by cardiology to see if that helps and if the Trelegy does not help, will call our office back.  Nothing further needed at this time.

## 2019-01-18 DIAGNOSIS — I4891 Unspecified atrial fibrillation: Secondary | ICD-10-CM | POA: Diagnosis not present

## 2019-01-18 DIAGNOSIS — I10 Essential (primary) hypertension: Secondary | ICD-10-CM | POA: Diagnosis not present

## 2019-01-18 DIAGNOSIS — I2510 Atherosclerotic heart disease of native coronary artery without angina pectoris: Secondary | ICD-10-CM | POA: Diagnosis not present

## 2019-01-18 LAB — BASIC METABOLIC PANEL
BUN/Creatinine Ratio: 15 (ref 10–24)
BUN: 21 mg/dL (ref 8–27)
CO2: 25 mmol/L (ref 20–29)
Calcium: 10 mg/dL (ref 8.6–10.2)
Chloride: 93 mmol/L — ABNORMAL LOW (ref 96–106)
Creatinine, Ser: 1.38 mg/dL — ABNORMAL HIGH (ref 0.76–1.27)
GFR calc Af Amer: 58 mL/min/{1.73_m2} — ABNORMAL LOW (ref >59)
GFR calc non Af Amer: 50 mL/min/{1.73_m2} — ABNORMAL LOW (ref >59)
Glucose: 147 mg/dL — ABNORMAL HIGH (ref 65–99)
Potassium: 4 mmol/L (ref 3.5–5.2)
Sodium: 137 mmol/L (ref 134–144)

## 2019-01-19 ENCOUNTER — Telehealth: Payer: Self-pay

## 2019-01-19 NOTE — Telephone Encounter (Signed)
-----   Message from Jenean Lindau, MD sent at 01/18/2019  5:15 PM EDT ----- Pl advice him to keep well hydrated and rpt in one mo Jenean Lindau, MD 01/18/2019 5:15 PM

## 2019-01-19 NOTE — Telephone Encounter (Signed)
Called and. updated patient on results, questions answered for patient and his spouse Vicente Males.Patient instructed to try and drink more water.Copy of results sent to Dr. Helene Kelp per Dr.RRR

## 2019-01-25 ENCOUNTER — Telehealth (INDEPENDENT_AMBULATORY_CARE_PROVIDER_SITE_OTHER): Payer: Medicare Other | Admitting: Cardiology

## 2019-01-25 ENCOUNTER — Encounter: Payer: Self-pay | Admitting: Cardiology

## 2019-01-25 ENCOUNTER — Other Ambulatory Visit: Payer: Self-pay

## 2019-01-25 VITALS — BP 130/74 | HR 67 | Ht 69.0 in | Wt 180.0 lb

## 2019-01-25 DIAGNOSIS — E782 Mixed hyperlipidemia: Secondary | ICD-10-CM

## 2019-01-25 DIAGNOSIS — I2510 Atherosclerotic heart disease of native coronary artery without angina pectoris: Secondary | ICD-10-CM | POA: Diagnosis not present

## 2019-01-25 DIAGNOSIS — I10 Essential (primary) hypertension: Secondary | ICD-10-CM

## 2019-01-25 DIAGNOSIS — I4819 Other persistent atrial fibrillation: Secondary | ICD-10-CM

## 2019-01-25 MED ORDER — FUROSEMIDE 20 MG PO TABS: ORAL_TABLET | ORAL | 1 refills | Status: DC

## 2019-01-25 NOTE — Addendum Note (Signed)
Addended by: Particia Nearing B on: 01/25/2019 11:23 AM   Modules accepted: Orders

## 2019-01-25 NOTE — Patient Instructions (Signed)
Medication Instructions:  Your physician has recommended you make the following change in your medication:  DECREASE: FUROSEMIDE 20 mg (1 tab) every other day  If you need a refill on your cardiac medications before your next appointment, please call your pharmacy.   Lab work: Your physician recommends that you return for lab work in:  1 week BMP  If you have labs (blood work) drawn today and your tests are completely normal, you will receive your results only by: Marland Kitchen MyChart Message (if you have MyChart) OR . A paper copy in the mail If you have any lab test that is abnormal or we need to change your treatment, we will call you to review the results.  Testing/Procedures: None  Follow-Up: At [facility], you and your health needs are our priority.  As part of our continuing mission to provide you with exceptional heart care, we have created designated Provider Care Teams.  These Care Teams include your primary Cardiologist (physician) and Advanced Practice Providers (APPs -  Physician Assistants and Nurse Practitioners) who all work together to provide you with the care you need, when you need it. You will need a follow up appointment in 1 months.  Any Other Special Instructions Will Be Listed Below (If Applicable).  PLEASE CHECK WEIGHTS DAILY AND RECORD.

## 2019-01-25 NOTE — Progress Notes (Signed)
Virtual Visit via Video Note   This visit type was conducted due to national recommendations for restrictions regarding the COVID-19 Pandemic (e.g. social distancing) in an effort to limit this patient's exposure and mitigate transmission in our community.  Due to his co-morbid illnesses, this patient is at least at moderate risk for complications without adequate follow up.  This format is felt to be most appropriate for this patient at this time.  All issues noted in this document were discussed and addressed.  A limited physical exam was performed with this format.  Please refer to the patient's chart for his consent to telehealth for [facility] [place].   Date:  01/25/2019   ID:  Juan Valdez, DOB 1946/01/20, MRN [id number]  Patient Location: Home Provider Location: Office  PCP:  Ronita Hipps, MD  Cardiologist:  No primary care provider on file.  Electrophysiologist:  None   Evaluation Performed:  Follow-Up Visit  Chief Complaint: Atrial fibrillation  History of Present Illness:    Juan Valdez is a 73 y.o. male with past medical history of atrial fibrillation.  He was evaluated for pedal edema.  He is taking 20 mg of furosemide on a daily basis and tells me that he feels much better.  His pedal edema has resolved and he is very happy about it.  He denies any chest pain orthopnea or PND.  At the time of my evaluation, the patient is alert awake oriented and in no distress.  The patient does not have symptoms concerning for COVID-19 infection (fever, chills, cough, or new shortness of breath).    Past Medical History:  Diagnosis Date  . Anemia   . Atrial fibrillation ([place]) 09/07/2018  . Atrial fibrillation ([place])   . Diabetes ([place])   . Gout   . Lung cancer [facility])    Past Surgical History:  Procedure Laterality Date  . ABDOMINAL SURGERY    . ROTATOR CUFF REPAIR       Current Meds  Medication Sig  . albuterol (VENTOLIN HFA) 108 (90 Base) MCG/ACT inhaler Inhale 2  puffs into the lungs every 6 (six) hours as needed for wheezing or shortness of breath.  . allopurinol (ZYLOPRIM) 300 MG tablet Take 300 mg by mouth daily.  Marland Kitchen apixaban (ELIQUIS) 5 MG TABS tablet Take 1 tablet (5 mg total) by mouth 2 (two) times daily.  Marland Kitchen atorvastatin (LIPITOR) 40 MG tablet Take 40 mg by mouth daily.  Marland Kitchen dicyclomine (BENTYL) 20 MG tablet Take 20 mg by mouth every 6 (six) hours.  . Fluticasone-Umeclidin-Vilant (TRELEGY ELLIPTA) 100-62.5-25 MCG/INH AEPB Inhale 1 puff into the lungs daily.  . furosemide (LASIX) 20 MG tablet Take 1 tablet (20 mg total) by mouth daily.  Marland Kitchen ipratropium (ATROVENT) 0.03 % nasal spray Place 2 sprays into both nostrils every 12 (twelve) hours.  . metFORMIN (GLUMETZA) 500 MG (MOD) 24 hr tablet Take 500 mg by mouth 2 (two) times daily.  . metoprolol succinate (TOPROL-XL) 25 MG 24 hr tablet Take 1 tablet (25 mg total) by mouth daily.  Marland Kitchen omeprazole (PRILOSEC) 40 MG capsule Take 40 mg by mouth 2 (two) times daily.  . tamsulosin (FLOMAX) 0.4 MG CAPS capsule Take 0.4 mg by mouth daily after supper.   . traZODone (DESYREL) 100 MG tablet Take 100 mg by mouth at bedtime.     Allergies:   Patient has no known allergies.   Social History   Tobacco Use  . Smoking status: Former Smoker    Packs/day:  2.00    Years: 50.00    Pack years: 100.00    Types: Cigarettes    Last attempt to quit: 06/13/2014    Years since quitting: 4.6  . Smokeless tobacco: Never Used  Substance Use Topics  . Alcohol use: Not on file  . Drug use: Not on file     Family Hx: The patient's family history is not on file. He was adopted.  ROS:   Please see the history of present illness.    As above All other systems reviewed and are negative.   Prior CV studies:   The following studies were reviewed today:  Results of the echocardiogram was discussed with the patient  Labs/Other Tests and Data Reviewed:    EKG:  No ECG reviewed.  Recent Labs: 09/08/2018: ALT 17; Hemoglobin  14.6; Platelets 152; TSH 3.380 01/18/2019: BUN 21; Creatinine, Ser 1.38; Potassium 4.0; Sodium 137   Recent Lipid Panel Lab Results  Component Value Date/Time   CHOL 116 09/08/2018 08:15 AM   TRIG 99 09/08/2018 08:15 AM   HDL 36 (L) 09/08/2018 08:15 AM   CHOLHDL 3.2 09/08/2018 08:15 AM   LDLCALC 60 09/08/2018 08:15 AM    Wt Readings from Last 3 Encounters:  01/25/19 180 lb (81.6 kg)  01/11/19 189 lb (85.7 kg)  12/27/18 185 lb (83.9 kg)     Objective:    Vital Signs:  BP 130/74 (BP Location: Left Arm, Patient Position: Sitting, Cuff Size: Normal)   Pulse 67   Ht 5\' 9"  (1.753 m)   Wt 180 lb (81.6 kg)   SpO2 98%   BMI 26.58 kg/m    VITAL SIGNS:  reviewed  ASSESSMENT & PLAN:    1. Atrial fibrillation:I discussed with the patient atrial fibrillation, disease process. Management and therapy including rate and rhythm control, anticoagulation benefits and potential risks were discussed extensively with the patient. Patient had multiple questions which were answered to patient's satisfaction. 2. The patient has had pedal edema which has resolved completely.  His renal function is mildly compromised therefore I have asked him to cut down his diuretic to every other day.  He will be back in 1 week for a Chem-7.  Advised morning and when he can stop the test he will be paper with a log of his daily weights. 3. Essential hypertension pressure.  Diet was discussed. 4. He will be seen in follow-up appointment and thought earlier if he has any concerns.  At that time we will further visit the issue of stress testing.  This will be based on his follow-up and symptoms.  COVID-19 Education: The signs and symptoms of COVID-19 were discussed with the patient and how to seek care for testing (follow up with PCP or arrange E-visit).  The importance of social distancing was discussed today.  Time:   Today, I have spent 15 minutes with the patient with telehealth technology discussing the above  problems.     Medication Adjustments/Labs and Tests Ordered: Current medicines are reviewed at length with the patient today.  Concerns regarding medicines are outlined above.   Tests Ordered: No orders of the defined types were placed in this encounter.   Medication Changes: No orders of the defined types were placed in this encounter.   Disposition:  Follow up in 1 month(s)  Signed, Jenean Lindau, MD  01/25/2019 11:06 AM    [facility]

## 2019-01-29 ENCOUNTER — Ambulatory Visit: Payer: Medicare Other | Admitting: Cardiology

## 2019-02-01 ENCOUNTER — Telehealth: Payer: Self-pay | Admitting: Emergency Medicine

## 2019-02-01 DIAGNOSIS — I10 Essential (primary) hypertension: Secondary | ICD-10-CM | POA: Diagnosis not present

## 2019-02-01 DIAGNOSIS — I4819 Other persistent atrial fibrillation: Secondary | ICD-10-CM | POA: Diagnosis not present

## 2019-02-01 DIAGNOSIS — I2510 Atherosclerotic heart disease of native coronary artery without angina pectoris: Secondary | ICD-10-CM | POA: Diagnosis not present

## 2019-02-01 NOTE — Telephone Encounter (Signed)
Spoke with patient. He stated that he had been having some SOB in the past few days. He believes this was coming getting confused about his medications. He wanted to know if it was safe for him to take his Trelegy and albuterol nebulizer in the same 24 hours. I explained to him that it was safe as long as he is following the instructions of using just one vial every 4-6 hrs as needed. He verbalized understanding.   He also wanted to know if he should take the Trelegy at night or in the morning. I explained to him that it might be best for him to take the Trelegy in the AM since this was a new medication for him. So he could monitor himself for any signs of a reaction or his SOB getting worse, he verbalized understanding.   Asked if he wanted me to send a message over to the APP of the day, he declined. He stated that he would call back if the medications seem like they were not working for him.   Nothing further needed at time of call.

## 2019-02-02 ENCOUNTER — Telehealth: Payer: Self-pay

## 2019-02-02 LAB — BASIC METABOLIC PANEL
BUN/Creatinine Ratio: 16 (ref 10–24)
BUN: 19 mg/dL (ref 8–27)
CO2: 26 mmol/L (ref 20–29)
Calcium: 9.4 mg/dL (ref 8.6–10.2)
Chloride: 98 mmol/L (ref 96–106)
Creatinine, Ser: 1.19 mg/dL (ref 0.76–1.27)
GFR calc Af Amer: 69 mL/min/{1.73_m2} (ref >59)
GFR calc non Af Amer: 60 mL/min/{1.73_m2} (ref >59)
Glucose: 169 mg/dL — ABNORMAL HIGH (ref 65–99)
Potassium: 4.1 mmol/L (ref 3.5–5.2)
Sodium: 140 mmol/L (ref 134–144)

## 2019-02-02 NOTE — Telephone Encounter (Signed)
Patient called and notified of lab results. 

## 2019-02-02 NOTE — Telephone Encounter (Signed)
-----   Message from Jenean Lindau, MD sent at 02/02/2019  8:39 AM EDT ----- The results of the study is unremarkable. Please inform patient. I will discuss in detail at next appointment. Cc  primary care/referring physician Jenean Lindau, MD 02/02/2019 8:39 AM

## 2019-02-07 DIAGNOSIS — K589 Irritable bowel syndrome without diarrhea: Secondary | ICD-10-CM | POA: Diagnosis not present

## 2019-02-07 DIAGNOSIS — E119 Type 2 diabetes mellitus without complications: Secondary | ICD-10-CM | POA: Diagnosis not present

## 2019-02-07 DIAGNOSIS — Z79899 Other long term (current) drug therapy: Secondary | ICD-10-CM | POA: Diagnosis not present

## 2019-02-07 DIAGNOSIS — Z0000 Encounter for general adult medical examination without abnormal findings: Secondary | ICD-10-CM | POA: Diagnosis not present

## 2019-02-12 DIAGNOSIS — D509 Iron deficiency anemia, unspecified: Secondary | ICD-10-CM

## 2019-02-27 ENCOUNTER — Telehealth: Payer: Self-pay | Admitting: Gastroenterology

## 2019-02-27 NOTE — Telephone Encounter (Signed)
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 Per patient request records will be sent to Dr. Rush Landmark for review. Please advise for scheduling.

## 2019-03-07 DIAGNOSIS — M25569 Pain in unspecified knee: Secondary | ICD-10-CM | POA: Diagnosis not present

## 2019-03-07 DIAGNOSIS — J449 Chronic obstructive pulmonary disease, unspecified: Secondary | ICD-10-CM | POA: Diagnosis not present

## 2019-03-07 DIAGNOSIS — H539 Unspecified visual disturbance: Secondary | ICD-10-CM | POA: Diagnosis not present

## 2019-03-08 ENCOUNTER — Telehealth (INDEPENDENT_AMBULATORY_CARE_PROVIDER_SITE_OTHER): Payer: Medicare Other | Admitting: Cardiology

## 2019-03-08 ENCOUNTER — Other Ambulatory Visit: Payer: Self-pay

## 2019-03-08 ENCOUNTER — Encounter: Payer: Self-pay | Admitting: Cardiology

## 2019-03-08 VITALS — BP 132/82 | HR 72 | Ht 69.0 in | Wt 189.8 lb

## 2019-03-08 DIAGNOSIS — E782 Mixed hyperlipidemia: Secondary | ICD-10-CM

## 2019-03-08 DIAGNOSIS — I2510 Atherosclerotic heart disease of native coronary artery without angina pectoris: Secondary | ICD-10-CM

## 2019-03-08 DIAGNOSIS — Z1329 Encounter for screening for other suspected endocrine disorder: Secondary | ICD-10-CM | POA: Diagnosis not present

## 2019-03-08 DIAGNOSIS — E088 Diabetes mellitus due to underlying condition with unspecified complications: Secondary | ICD-10-CM | POA: Diagnosis not present

## 2019-03-08 DIAGNOSIS — I10 Essential (primary) hypertension: Secondary | ICD-10-CM | POA: Diagnosis not present

## 2019-03-08 DIAGNOSIS — J431 Panlobular emphysema: Secondary | ICD-10-CM

## 2019-03-08 DIAGNOSIS — I4891 Unspecified atrial fibrillation: Secondary | ICD-10-CM

## 2019-03-08 NOTE — Addendum Note (Signed)
Addended by: Beckey Rutter on: 03/08/2019 09:08 AM   Modules accepted: Orders

## 2019-03-08 NOTE — Patient Instructions (Addendum)
Medication Instructions:  Your physician has recommended you make the following change in your medication:  STOP taking metoprolol   If you need a refill on your cardiac medications before your next appointment, please call your pharmacy.   Lab work: Your physician recommends that you BMP, CBC, TSH, LFT and lipid panel drawn today.    If you have labs (blood work) drawn today and your tests are completely normal, you will receive your results only by: Marland Kitchen MyChart Message (if you have MyChart) OR . A paper copy in the mail If you have any lab test that is abnormal or we need to change your treatment, we will call you to review the results.  Testing/Procedures: NONE  Follow-Up: At [facility], you and your health needs are our priority.  As part of our continuing mission to provide you with exceptional heart care, we have created designated Provider Care Teams.  These Care Teams include your primary Cardiologist (physician) and Advanced Practice Providers (APPs -  Physician Assistants and Nurse Practitioners) who all work together to provide you with the care you need, when you need it. You will need a follow up appointment in 1 month.

## 2019-03-08 NOTE — Progress Notes (Signed)
Cardiology Office Note:    Date:  03/08/2019   ID:  Juan Valdez, DOB [birth year], MRN [id number]  PCP:  Ronita Hipps, MD  Cardiologist:  Jenean Lindau, MD   Referring MD: Ronita Hipps, MD    ASSESSMENT:    1. Mixed dyslipidemia   2. Coronary artery disease involving native coronary artery of native heart without angina pectoris   3. Atrial fibrillation, unspecified type ([place])   4. Thyroid disorder screen    PLAN:    In order of problems listed above:  1. Permanent atrial fibrillation: I discussed with the patient atrial fibrillation, disease process. Management and therapy including rate and rhythm control, anticoagulation benefits and potential risks were discussed extensively with the patient. Patient had multiple questions which were answered to patient's satisfaction.  I discussed the findings of the echocardiogram with the patient at extensive length.  Due to the nature of the cardiac anatomy including left atrial size I think she will be best managed by rate management.  He is on the bradycardic side and takes a low-dose beta-blocker.  I will stop it at this time he will keep a track of his pulse and blood pressures and will be seen in follow-up appointment in a month or earlier if he has any concerns.  Hopefully this might help some of his pulmonary issues also. 2. Essential hypertension: Blood pressure stable 3. Mixed dyslipidemia: He is fasting this morning and will have all blood work including Chem-7, CBC, TSH and liver lipid check 4. Follow-up appointment in a month or earlier if he has any concerns.   Medication Adjustments/Labs and Tests Ordered: Current medicines are reviewed at length with the patient today.  Concerns regarding medicines are outlined above.  Orders Placed This Encounter  Procedures  . Basic Metabolic Panel (BMET)  . CBC  . TSH  . Hepatic function panel  . Lipid Profile   No orders of the defined types were placed in this encounter.     No chief complaint on file.    History of Present Illness:    Juan Valdez is a 73 y.o. male with past medical history of coronary artery disease, permanent atrial fibrillation, essential hypertension and dyslipidemia.  He denies any problems at this time and takes care of activities of daily living.  No chest pain orthopnea or PND.  He tells me that he has some fatigue on exertion that this has been stable.  He also has gastrointestinal issues and this is followed by his gastroenterologist.  At the time of my evaluation, the patient is alert awake oriented and in no distress.  Past Medical History:  Diagnosis Date  . Anemia   . Atrial fibrillation ([place]) 09/07/2018  . Atrial fibrillation ([place])   . Diabetes ([place])   . Gout   . Lung cancer [facility])     Past Surgical History:  Procedure Laterality Date  . ABDOMINAL SURGERY    . ROTATOR CUFF REPAIR      Current Medications: Current Meds  Medication Sig  . albuterol (VENTOLIN HFA) 108 (90 Base) MCG/ACT inhaler Inhale 2 puffs into the lungs every 6 (six) hours as needed for wheezing or shortness of breath.  . allopurinol (ZYLOPRIM) 300 MG tablet Take 300 mg by mouth daily.  Marland Kitchen apixaban (ELIQUIS) 5 MG TABS tablet Take 1 tablet (5 mg total) by mouth 2 (two) times daily.  Marland Kitchen atorvastatin (LIPITOR) 40 MG tablet Take 40 mg by mouth daily.  Marland Kitchen dicyclomine (BENTYL)  20 MG tablet Take 20 mg by mouth every 6 (six) hours.  . Fluticasone-Umeclidin-Vilant (TRELEGY ELLIPTA) 100-62.5-25 MCG/INH AEPB Inhale 1 puff into the lungs daily.  . furosemide (LASIX) 20 MG tablet Take 20 mg (1 Tab) every other day  . ipratropium (ATROVENT) 0.03 % nasal spray Place 2 sprays into both nostrils every 12 (twelve) hours.  . metFORMIN (GLUMETZA) 500 MG (MOD) 24 hr tablet Take 500 mg by mouth 2 (two) times daily.  Marland Kitchen omeprazole (PRILOSEC) 40 MG capsule Take 40 mg by mouth 2 (two) times daily.  . tamsulosin (FLOMAX) 0.4 MG CAPS capsule Take 0.4 mg by mouth daily after  supper.   . traMADol (ULTRAM) 50 MG tablet Take 50 mg by mouth every 6 (six) hours as needed.  . traZODone (DESYREL) 100 MG tablet Take 100 mg by mouth at bedtime.  . [DISCONTINUED] metoprolol succinate (TOPROL-XL) 25 MG 24 hr tablet Take 1 tablet (25 mg total) by mouth daily.     Allergies:   Patient has no known allergies.   Social History   Socioeconomic History  . Marital status: Unknown    Spouse name: Not on file  . Number of children: Not on file  . Years of education: Not on file  . Highest education level: Not on file  Occupational History  . Not on file  Social Needs  . Financial resource strain: Not on file  . Food insecurity    Worry: Not on file    Inability: Not on file  . Transportation needs    Medical: Not on file    Non-medical: Not on file  Tobacco Use  . Smoking status: Former Smoker    Packs/day: 2.00    Years: 50.00    Pack years: 100.00    Types: Cigarettes    Quit date: 06/13/2014    Years since quitting: 4.7  . Smokeless tobacco: Never Used  Substance and Sexual Activity  . Alcohol use: Not on file  . Drug use: Not on file  . Sexual activity: Not on file  Lifestyle  . Physical activity    Days per week: Not on file    Minutes per session: Not on file  . Stress: Not on file  Relationships  . Social Herbalist on phone: Not on file    Gets together: Not on file    Attends religious service: Not on file    Active member of club or organization: Not on file    Attends meetings of clubs or organizations: Not on file    Relationship status: Not on file  Other Topics Concern  . Not on file  Social History Narrative  . Not on file     Family History: The patient's family history is not on file. He was adopted.  ROS:   Please see the history of present illness.    All other systems reviewed and are negative.  EKGs/Labs/Other Studies Reviewed:    The following studies were reviewed today: EKG reveals atrial fibrillation with  slow ventricular rate. As mentioned above.  Echocardiogram report reveals  IMPRESSIONS    1. The left ventricle has mildly reduced systolic function of 65-46%. The cavity size is normal. There is moderately increased left ventricular wall thickness. Left ventricular diastology could not be evaluated secondary to atrial fibrillation.  2. The right ventricle has normal systolic function. The cavity in normal in size. There is no increase in right ventricular wall thickness.  3. Severely dilated left  atrial size.  4. Mildly dilated right atrial size.  5. The mitral valve is degenerative There is moderate thickening. Mitral valve regurgitation is mild to moderate by color flow Doppler.  6. The aortic valve is tricuspid. There is mild thickening of the aortic valve.  7. The aortic root and ascending aorta are normal in size and structure.  8. No evidence of left ventricular regional wall motion abnormalities.  Recent Labs: 09/08/2018: ALT 17; Hemoglobin 14.6; Platelets 152; TSH 3.380 02/01/2019: BUN 19; Creatinine, Ser 1.19; Potassium 4.1; Sodium 140  Recent Lipid Panel    Component Value Date/Time   CHOL 116 09/08/2018 0815   TRIG 99 09/08/2018 0815   HDL 36 (L) 09/08/2018 0815   CHOLHDL 3.2 09/08/2018 0815   LDLCALC 60 09/08/2018 0815    Physical Exam:    VS:  BP 132/82 (BP Location: Left Arm, Patient Position: Sitting, Cuff Size: Normal)   Pulse 72   Ht 5\' 9"  (1.753 m)   Wt 189 lb 12.8 oz (86.1 kg)   SpO2 99%   BMI 28.03 kg/m     Wt Readings from Last 3 Encounters:  03/08/19 189 lb 12.8 oz (86.1 kg)  01/25/19 180 lb (81.6 kg)  01/11/19 189 lb (85.7 kg)     GEN: Patient is in no acute distress HEENT: Normal NECK: No JVD; No carotid bruits LYMPHATICS: No lymphadenopathy CARDIAC: Hear sounds regular, 2/6 systolic murmur at the apex. RESPIRATORY:  Clear to auscultation without rales, wheezing or rhonchi  ABDOMEN: Soft, non-tender, non-distended MUSCULOSKELETAL:  No  edema; No deformity  SKIN: Warm and dry NEUROLOGIC:  Alert and oriented x 3 PSYCHIATRIC:  Normal affect   Signed, Jenean Lindau, MD  03/08/2019 8:53 AM    [place]

## 2019-03-09 LAB — HEMOGLOBIN A1C
Est. average glucose Bld gHb Est-mCnc: 148 mg/dL
Hgb A1c MFr Bld: 6.8 % — ABNORMAL HIGH (ref 4.8–5.6)

## 2019-03-09 LAB — BASIC METABOLIC PANEL
BUN/Creatinine Ratio: 17 (ref 10–24)
BUN: 21 mg/dL (ref 8–27)
CO2: 26 mmol/L (ref 20–29)
Calcium: 10.8 mg/dL — ABNORMAL HIGH (ref 8.6–10.2)
Chloride: 95 mmol/L — ABNORMAL LOW (ref 96–106)
Creatinine, Ser: 1.21 mg/dL (ref 0.76–1.27)
GFR calc Af Amer: 68 mL/min/{1.73_m2} (ref >59)
GFR calc non Af Amer: 59 mL/min/{1.73_m2} — ABNORMAL LOW (ref >59)
Glucose: 201 mg/dL — ABNORMAL HIGH (ref 65–99)
Potassium: 4.7 mmol/L (ref 3.5–5.2)
Sodium: 139 mmol/L (ref 134–144)

## 2019-03-09 LAB — HEPATIC FUNCTION PANEL
ALT: 28 IU/L (ref 0–44)
AST: 24 IU/L (ref 0–40)
Albumin: 5.1 g/dL — ABNORMAL HIGH (ref 3.7–4.7)
Alkaline Phosphatase: 154 IU/L — ABNORMAL HIGH (ref 39–117)
Bilirubin Total: 0.8 mg/dL (ref 0.0–1.2)
Bilirubin, Direct: 0.25 mg/dL (ref 0.00–0.40)
Total Protein: 7.1 g/dL (ref 6.0–8.5)

## 2019-03-09 LAB — CBC
Hematocrit: 47 % (ref 37.5–51.0)
Hemoglobin: 16 g/dL (ref 13.0–17.7)
MCH: 30.2 pg (ref 26.6–33.0)
MCHC: 34 g/dL (ref 31.5–35.7)
MCV: 89 fL (ref 79–97)
Platelets: 146 10*3/uL — ABNORMAL LOW (ref 150–450)
RBC: 5.29 x10E6/uL (ref 4.14–5.80)
RDW: 13.6 % (ref 11.6–15.4)
WBC: 7.7 10*3/uL (ref 3.4–10.8)

## 2019-03-09 LAB — TSH: TSH: 1.08 u[IU]/mL (ref 0.450–4.500)

## 2019-03-09 LAB — LIPID PANEL
Chol/HDL Ratio: 3.2 ratio (ref 0.0–5.0)
Cholesterol, Total: 140 mg/dL (ref 100–199)
HDL: 44 mg/dL (ref >39)
LDL Calculated: 86 mg/dL (ref 0–99)
Triglycerides: 49 mg/dL (ref 0–149)
VLDL Cholesterol Cal: 10 mg/dL (ref 5–40)

## 2019-03-13 ENCOUNTER — Telehealth: Payer: Self-pay | Admitting: Emergency Medicine

## 2019-03-13 DIAGNOSIS — J449 Chronic obstructive pulmonary disease, unspecified: Secondary | ICD-10-CM

## 2019-03-13 MED ORDER — ALBUTEROL SULFATE (2.5 MG/3ML) 0.083% IN NEBU: 2.5000 mg | INHALATION_SOLUTION | Freq: Four times a day (QID) | RESPIRATORY_TRACT | 0 refills | Status: DC | PRN

## 2019-03-13 MED ORDER — ALBUTEROL SULFATE HFA 108 (90 BASE) MCG/ACT IN AERS: INHALATION_SPRAY | RESPIRATORY_TRACT | 5 refills | Status: DC

## 2019-03-13 NOTE — Telephone Encounter (Signed)
Pt called back to speak with someone about get samples for the rescue inhaler

## 2019-03-13 NOTE — Telephone Encounter (Signed)
Pt can not get rescue inhaler filled, too soon for insurance.  Pt has a nebulizer, can I send in albuterol nebs and he also requested a script for a portable neb machine.  Beth, please advise.  Thanks!

## 2019-03-13 NOTE — Telephone Encounter (Signed)
Yes that is fine

## 2019-03-13 NOTE — Telephone Encounter (Signed)
Lets try change albuterol rescue inhaler order to every 4-6 hours as needed for shortness of breath/wheezing and that should allow him to be able to filled prescription sooner.   Question is why is he going through it so quickly, does he need an apt?

## 2019-03-13 NOTE — Telephone Encounter (Signed)
I received a call to the on call number from [employer]. He stated that he wanted to know where his albuterol medication was. I had read multiple previous communications that had occurred between the patient and Triage. I explained that we had sent in prescriptions for his rescue inhaler, and we had ordered a portable nebulizer machine for him this afternoon at 4:30 pm. He was under the impression that he was doing to have albuterol nebs sent in. He called the after hours number wanting to know where his albuterol neb medication was. I explained that we had sent in a refill for his ProAir inhaler. He became very angry. I told him I would sent in the prescription, but he said he would call the office in the morning and hung up on me. I called him back.  I have ordered 4 doses of Albuterol 3 cc's for nebulization.This scrip  has been sent to [facility] [place].  In reading the notes earlier today there was concern he was over using his albuterol, which is why I am only sending in enough medication to get him through the night. He refused an appointment when it was offered..I re-iterated  that he needs to not use the nebulized albuterol or the inhaler  medication any more than every 6 hours as needed for shortness of breath or wheezing. ( I explained that he can only use one or the other every 6 hours). He verbalized understanding.  Triage/ Cherina, please follow up with him in the morning and see if we can get him in for an appointment. If not an appointment then a televisit to make sure he understands how to use his medications, and make sure he does not need something in addition for a flare. I told him we would order more nebs once we follow up in the morning, and that 4 doses overnight should be adequate. I told him to seek emergency care if he could not breath or if the albuterol nebs don't help.  Of note, he was in no respiratory distress on either phone call. No audible wheezing , able to speak in  full sentences without any difficulty.

## 2019-03-13 NOTE — Telephone Encounter (Signed)
Left message for patient to call back to let him know that I have placed the order for the portable nebulizer machine.

## 2019-03-13 NOTE — Telephone Encounter (Signed)
Spoke with patient. He stated that he is ok with changing the RX so that he can get his Ventolin refilled. He stated that he has been getting ProAir and it is not working for him. Stated that he has to more of the medication in order for it to be effective for him. I offered him an appointment but he declined.   Will go ahead and send in the RX.   He also wants to know if he could have an order for the portable nebulizer.

## 2019-03-14 ENCOUNTER — Ambulatory Visit (INDEPENDENT_AMBULATORY_CARE_PROVIDER_SITE_OTHER): Payer: Medicare Other | Admitting: Pulmonary Disease

## 2019-03-14 ENCOUNTER — Encounter: Payer: Self-pay | Admitting: Pulmonary Disease

## 2019-03-14 ENCOUNTER — Other Ambulatory Visit: Payer: Self-pay

## 2019-03-14 DIAGNOSIS — Z79899 Other long term (current) drug therapy: Secondary | ICD-10-CM

## 2019-03-14 DIAGNOSIS — Z87891 Personal history of nicotine dependence: Secondary | ICD-10-CM

## 2019-03-14 DIAGNOSIS — J449 Chronic obstructive pulmonary disease, unspecified: Secondary | ICD-10-CM

## 2019-03-14 HISTORY — DX: Personal history of nicotine dependence: Z87.891

## 2019-03-14 MED ORDER — ALBUTEROL SULFATE (2.5 MG/3ML) 0.083% IN NEBU: 2.5000 mg | INHALATION_SOLUTION | Freq: Four times a day (QID) | RESPIRATORY_TRACT | 0 refills | Status: AC | PRN

## 2019-03-14 NOTE — Assessment & Plan Note (Signed)
Assessment:  Patient needs formal pulmonary function testing, scheduled for July/2020 171-pack-year smoking history Maintained on Trelegy Ellipta inhaler Patient reports that he is maintained well with his Trelegy Ellipta Using [place] nebulized meds 2 times daily Patient prefers Ventolin HFA for rescue inhaler which his insurance will not cover it is a tier for medication  Plan: Continue Trelegy Ellipta Okay to use albuterol nebulized medication in place of rescue inhaler during this time can use every 6 hours as needed for shortness of breath or wheezing Albuterol nebulized medications refilled today Okay to stop pro-air Referral to [facility] for medication assistance, specifically for help with cost of Trelegy Ellipta as you have spent over $600 out of pocket, and potentially could qualify then for also Ventolin Follow-up with our office later on this month as planned with a pulmonary function test

## 2019-03-14 NOTE — Assessment & Plan Note (Signed)
Plan: Referral to [facility] for medication management Patient reports he spent over $600 out of pocket so far this year of medication cost Could potentially qualify for [place] for you program for help with Trelegy Ellipta as well as Ventolin

## 2019-03-14 NOTE — Progress Notes (Signed)
Virtual Visit via Telephone Note  I connected with [place] on 03/14/19 at 12:00 PM EDT by telephone and verified that I am speaking with the correct person using two identifiers.  Location: Patient: Home Provider: [facility] - [address], [place], [place] [address]   I discussed the limitations, risks, security and privacy concerns of performing an evaluation and management service by telephone and the availability of in person appointments. I also discussed with the patient that there may be a patient responsible charge related to this service. The patient expressed understanding and agreed to proceed.  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 ( full 5 years of surveillance >>  can transition him over to low-dose lung cancer screening CTs until age 11) Maintenance medications: Trelegy Ellipta  Albuterol inhaler as rescue  Chief complaint: Medication Management   73 year old male former smoker (171-pack-year smoking history) followed in our office for COPD.  Patient completing a tele-visit with our office today for medication management.  Patient is currently maintained on Trelegy Ellipta as his maintenance inhaler which he prefers.  There have been some issues regarding his rescue inhaler.  Patient was switched to pro-air based off of insurance coverage.  Patient reports the pro-air is not working well for him and he prefers to be on Ventolin.  Unfortunately his insurance [place] healthcare will not cover this.  He reports that even with a prior Auth this will be $100.  It is a tier for medication.  This is frustrating to the patient as he feels that the pro-air rescue inhaler does not work well.   Patient does have albuterol nebulizers at home which he uses occasionally about 1-2 times daily.  He prefers using the nebulized medications versus the pro-air.  He is wondering how her can proceed forward with his medication management this time.  Patient has upcoming pulmonary function tests with our office later on this month in July.  With a follow-up visit.  Observations/Objective:  10/18/2018  Echo: EF of 45-50%, Normal RV systolic function, Severe dilation of LA,. Mildly dilated RA, MV regurge and moderate thickening, Mild thickening of a tricuspic  Aortic valve with mild thickening.  12/21/2018: CXR IMPRESSION: Elevated left hemidiaphragm with adjacent atelectasis. Mild cardiomegaly with aortic tortuosity. Central bronchial thickening. No focal airspace disease. No pleural effusion, pneumothorax or pulmonary edema.  Assessment and Plan:  COPD (chronic obstructive pulmonary disease) ([place]) Assessment:  Patient needs formal pulmonary function testing, scheduled for July/2020 171-pack-year smoking history Maintained on Trelegy Ellipta inhaler Patient reports that he is maintained well with his Trelegy Ellipta Using [place] nebulized meds 2 times daily Patient prefers Ventolin HFA for rescue inhaler which his insurance will not cover it is a tier for medication  Plan: Continue Trelegy Ellipta Okay to use albuterol nebulized medication in place of rescue inhaler during this time can use every 6 hours as needed for shortness of breath or wheezing Albuterol nebulized medications refilled today Okay to stop pro-air Referral to [facility] for medication assistance, specifically for help with cost of Trelegy Ellipta as you have spent over $600 out of pocket, and potentially could qualify then for also Ventolin Follow-up with our office  later on this month as planned with a pulmonary function test  Medication management Plan: Referral [facility] for  medication management Patient reports he spent over $600 out of pocket so far this year of medication cost Could potentially qualify for [place] for you program for help with Trelegy Ellipta as well as Ventolin  Former smoker Assessment: Former smoker Quit 2015 171-pack-year smoking history 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  Plan: Complete pulmonary function testing later on this month Consider referral to lung cancer screening program based off of FEV1 on pulmonary function test Continue to not smoke   Follow Up Instructions:  Return in about 4 weeks (around 04/11/2019), or if symptoms worsen or fail to improve, for Follow up with Wyn Quaker FNP-C, Follow up for PFT.   I discussed the assessment and treatment plan with the patient. The patient was provided an opportunity to ask questions and all were answered. The patient agreed with the plan and demonstrated an understanding of the instructions.   The patient was advised to call back or seek an in-person evaluation if the symptoms worsen or if the condition fails to improve as anticipated.  I provided 24 minutes of non-face-to-face time during this encounter.   Lauraine Rinne, NP

## 2019-03-14 NOTE — Patient Outreach (Signed)
[facility]) Care Management  03/14/2019  Juan Valdez [name] [birth year] [id number]   Telephone Screen  Referral Date: 03/14/2019 Referral Source: MD Office Referral Reason: " COPD, med mgmt-Trelegy/Ventolin HFA coverage" Insurance: [facility] [employer]   Outreach attempt # 1 to patient. Spoke with patient and screening completed. He resides in his home along with his fiance. Patient states that he is independent with ADLs/IADLs. He drives himself to appts. He recalls that he had one fall this past year which was on yesterday. He voices he was clumsy while he was outside and accidentally fell but no injuries sustained. He denies use of assistive devices.   Conditions: Per chart review, patient has PMH of A-fib, HLD, COPD, former smoker(quit 2015), right lung CA s/p lobectomy(2015). Patient reports that he has been having breathing problems for several years but knows how to manage it. He states he has SOB off and on. He is using meds to help with sx mgmt. He reports he has been taking inhalers off and on since 1985. RN CM offered RN Health Coach for further education and support in managing chronic condition. Patient declined and voiced that he has "lung test coming up at end of the month and will see what happens."  Medications: Per patient report, he is taking 9 prescription meds and about 6 OTC meds. His fiance assists him with managing meds. Patient voices that he has been unable to afford his respiratory meds since switching to [facility] plan earlier this year. He reports that Trelegy is going to cost him $147/month supply and Ventolin is about $100/one month supply. He voices frustrations regarding cost of meds and how with his previous coverage plans cost were much lower. Patient was started on nebulizer treatments on yesterday as well. He voices some relief with med.   Appointments: He had tele visit with pulmonologist earlier today.  Consent: [facility] services reviewed and discussed with  patient. Patient was agreeable and gave verbal consent for [facility] pharmacy referral.   Plan: RN CM will send [place] referral for possible med assistance and polypharmacy med review.   Juan Montgomery, RN,BSN,CCM [facility] Telephonic Care Management Coordinator Direct Phone: [phone] Toll Free: [phone] Fax: [phone]

## 2019-03-14 NOTE — Patient Instructions (Addendum)
Continue Trelegy Ellipta  >>> 1 puff daily in the morning >>>rinse mouth out after use  >>> This inhaler contains 3 medications that help manage her respiratory status, contact our office if you cannot afford this medication or unable to remain on this medication  Okay to use albuterol nebulized every 6 hours as needed for shortness of breath or wheezing    Only use your albuterol as a rescue medication to be used if you can't catch your breath by resting or doing a relaxed purse lip breathing pattern.  - The less you use it, the better it will work when you need it. - Ok to use up to 2 puffs  every 4 hours if you must but call for immediate appointment if use goes up over your usual need - Don't leave home without it !!  (think of it like the spare tire for your car)    Note your daily symptoms > remember "red flags" for COPD:   >>>Increase in cough >>>increase in sputum production >>>increase in shortness of breath or activity  intolerance.   If you notice these symptoms, please call the office to be seen.     Amb referral to [employer] for medication management      Return in about 4 weeks (around 04/11/2019), or if symptoms worsen or fail to improve, for Follow up with Wyn Quaker FNP-C, Follow up for PFT.   Coronavirus (COVID-19) Are you at risk?  Are you at risk for the Coronavirus (COVID-19)?  To be considered HIGH RISK for Coronavirus (COVID-19), you have to meet the following criteria:  . Traveled to [place], [place], [place], [place] or [place]; or in the [place] to [place], [place], [place], or [place]; and have fever, cough, and shortness of breath within the last 2 weeks of travel OR . Been in close contact with a person diagnosed with COVID-19 within the last 2 weeks and have fever, cough, and shortness of breath . IF YOU DO NOT MEET THESE CRITERIA, YOU ARE CONSIDERED LOW RISK FOR COVID-19.  What to do if you are HIGH RISK for  COVID-19?  Marland Kitchen If you are having a medical emergency, call 911. . Seek medical care right away. Before you go to a doctor's office, urgent care or emergency department, call ahead and tell them about your recent travel, contact with someone diagnosed with COVID-19, and your symptoms. You should receive instructions from your physician's office regarding next steps of care.  . When you arrive at healthcare provider, tell the healthcare staff immediately you have returned from visiting [place], [place], [place], [place] or [place]; or traveled in the [place] to [place], [place], [place], or [place]; in the last two weeks or you have been in close contact with a person diagnosed with COVID-19 in the last 2 weeks.   . Tell the health care staff about your symptoms: fever, cough and shortness of breath. . After you have been seen by a medical provider, you will be either: o Tested for (COVID-19) and discharged home on quarantine except to seek medical care if symptoms worsen, and asked to  - Stay home and avoid contact with others until you get your results (4-5 days)  - Avoid travel on public transportation if possible (such as bus, train, or airplane) or o Sent to the Emergency Department by EMS for evaluation, COVID-19 testing, and possible admission depending on your condition and test results.  What to do  if you are LOW RISK for COVID-19?  Reduce your risk of any infection by using the same precautions used for avoiding the common cold or flu:  Marland Kitchen Wash your hands often with soap and warm water for at least 20 seconds.  If soap and water are not readily available, use an alcohol-based hand sanitizer with at least 60% alcohol.  . If coughing or sneezing, cover your mouth and nose by coughing or sneezing into the elbow areas of your shirt or coat, into a tissue or into your sleeve (not your hands). . Avoid shaking hands with others and consider head nods or verbal greetings only. . Avoid  touching your eyes, nose, or mouth with unwashed hands.  . Avoid close contact with people who are sick. . Avoid places or events with large numbers of people in one location, like concerts or sporting events. . Carefully consider travel plans you have or are making. . If you are planning any travel outside or inside the [place], visit the CDC's Travelers' Health webpage for the latest health notices. . If you have some symptoms but not all symptoms, continue to monitor at home and seek medical attention if your symptoms worsen. . If you are having a medical emergency, call 911.   [place] / e-Visit: [email or web address]         [facility]: [facility]: [phone]                   [facility]: [phone]           It is flu season:   >>> Best ways to protect herself from the flu: Receive the yearly flu vaccine, practice good hand hygiene washing with soap and also using hand sanitizer when available, eat a nutritious meals, get adequate rest, hydrate appropriately   Please contact the office if your symptoms worsen or you have concerns that you are not improving.   Thank you for choosing [facility] for your healthcare, and for allowing [place] to partner with you on your healthcare journey. I am thankful to be able to provide care to you today.   Wyn Quaker FNP-C

## 2019-03-14 NOTE — Telephone Encounter (Signed)
Pt's Ventolin inhaler was sent to [facility] in [place] by Cherina 6/30. Portable nebulizer machine was also ordered for pt 6/30.  Called and spoke with pt in regards to the conversation he had with SG and asked if he would be fine with me scheduling a televisit with [name] to get meds managed for him and pt stated that would be fine. Pt has been scheduled a televisit today, 7/1 at 12pm with [name]. Nothing further needed.

## 2019-03-14 NOTE — Assessment & Plan Note (Signed)
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

## 2019-03-19 ENCOUNTER — Other Ambulatory Visit: Payer: Self-pay | Admitting: Pharmacist

## 2019-03-19 NOTE — Patient Outreach (Signed)
[place] [facility]) Care Management  [place]  03/19/2019  Braylee Bosher [place] 1946-06-22 [id number]   Reason for referral: Medication Assistance with Trelegy and Ventolin  Referral source: Dr. Wyn Quaker Current insurance: [employer]  Outreach:  Unsuccessful telephone call attempt #1 to patient.   HIPAA compliant voicemail left requesting a return call  Plan:  -I will make another outreach attempt to patient within 3-4 business days.    Ralene Bathe, PharmD, [place] [phone]

## 2019-03-20 ENCOUNTER — Other Ambulatory Visit: Payer: Self-pay | Admitting: Pharmacist

## 2019-03-20 DIAGNOSIS — B351 Tinea unguium: Secondary | ICD-10-CM | POA: Diagnosis not present

## 2019-03-20 DIAGNOSIS — Z7984 Long term (current) use of oral hypoglycemic drugs: Secondary | ICD-10-CM | POA: Diagnosis not present

## 2019-03-20 DIAGNOSIS — E119 Type 2 diabetes mellitus without complications: Secondary | ICD-10-CM | POA: Diagnosis not present

## 2019-03-20 DIAGNOSIS — I872 Venous insufficiency (chronic) (peripheral): Secondary | ICD-10-CM | POA: Diagnosis not present

## 2019-03-20 NOTE — Patient Outreach (Addendum)
[place] [facility]) Care Management  [place]   03/20/2019  Shriyans Kuenzi [place] 1946-07-07 [id number]  Reason for referral: Medication Assistance with Trelegy and Ventolin  Referral source: Dr. Wyn Quaker Current insurance: [facility]  PMHx includes but not limited to:  Former smoker (171 pack years), COPD, NSCLC s/p lobectomy '15, CAD, atrial fibrillation on chronic anticoagulation, HTN, HLD, T2DM, gout, thyroid dysfunction  Outreach:  Successful telephone call with Mr. Errico.  HIPAA identifiers verified.   Subjective:  Patient reports he and his fiance manage his medications together.  He uses a 7-day weekly pillbox with 4 time slots to organize.  He reports he is able to obtain Eliquis samples from his cardiology office periodically.   He denies having any side effects or concerns about any of his medications.  He reports he is out of his albuterol inhaler and only using nebulizer albuterol currently.    Objective: The 10-year ASCVD risk score Mikey Bussing DC Jr., et al., 2013) is: 44.1%   Values used to calculate the score:     Age: 73 years     Sex: Male     Is Non-Hispanic African American: No     Diabetic: Yes     Tobacco smoker: No     Systolic Blood Pressure: 237 mmHg     Is BP treated: Yes     HDL Cholesterol: 44 mg/dL     Total Cholesterol: 140 mg/dL  Lab Results  Component Value Date   CREATININE 1.21 03/08/2019   CREATININE 1.19 02/01/2019   CREATININE 1.38 (H) 01/18/2019    Lab Results  Component Value Date   HGBA1C 6.8 (H) 03/08/2019    Lipid Panel     Component Value Date/Time   CHOL 140 03/08/2019 0907   TRIG 49 03/08/2019 0907   HDL 44 03/08/2019 0907   CHOLHDL 3.2 03/08/2019 0907   LDLCALC 86 03/08/2019 0907    BP Readings from Last 3 Encounters:  03/08/19 132/82  01/25/19 130/74  01/11/19 127/69    No Known Allergies  Medications Reviewed Today    Reviewed by Lauraine Rinne, NP (Nurse Practitioner) on 03/14/19  at 1125  Med List Status: <None>  Medication Order Taking? Sig Documenting Provider Last Dose Status Informant  albuterol (PROVENTIL) (2.5 MG/3ML) 0.083% nebulizer solution [id number] Yes Take 3 mLs (2.5 mg total) by nebulization every 6 (six) hours as needed for up to 4 doses for wheezing or shortness of breath. Magdalen Spatz, NP Taking Active   albuterol (VENTOLIN HFA) 108 (90 Base) MCG/ACT inhaler [id number] No Use 2 puffs every 4-6 hours as needed for wheezing or shortness of breath  Patient not taking: Reported on 03/14/2019   Collene Gobble, MD Not Taking Active   allopurinol (ZYLOPRIM) 300 MG tablet [id number] Yes Take 300 mg by mouth daily. [provider] Taking Active   apixaban (ELIQUIS) 5 MG TABS tablet [id number] Yes Take 1 tablet (5 mg total) by mouth 2 (two) times daily. Revankar, Reita Cliche, MD Taking Active   atorvastatin (LIPITOR) 40 MG tablet [id number] Yes Take 40 mg by mouth daily. [provider] Taking Active   dicyclomine (BENTYL) 20 MG tablet [id number] Yes Take 20 mg by mouth every 6 (six) hours as needed.  [provider] Taking Active Self  Fluticasone-Umeclidin-Vilant (TRELEGY ELLIPTA) 100-62.5-25 MCG/INH AEPB [id number] Yes Inhale 1 puff into the lungs daily. Lauraine Rinne, NP Taking Active   furosemide (LASIX) 20 MG tablet [id number] Yes Take  20 mg (1 Tab) every other day Revankar, Reita Cliche, MD Taking Active   ipratropium (ATROVENT) 0.03 % nasal spray [id number] Yes Place 2 sprays into both nostrils every 12 (twelve) hours. [provider] Taking Active   metFORMIN (GLUMETZA) 500 MG (MOD) 24 hr tablet [id number] Yes Take 500 mg by mouth 2 (two) times daily. [provider] Taking Active   nitroGLYCERIN (NITROSTAT) 0.4 MG SL tablet [id number]  Place 1 tablet (0.4 mg total) under the tongue every 5 (five) minutes as needed for chest pain. Jenean Lindau, MD  Expired 12/06/18 2359   omeprazole (PRILOSEC) 40 MG capsule [id number] Yes Take 40  mg by mouth 2 (two) times daily. [provider] Taking Active   tamsulosin (FLOMAX) 0.4 MG CAPS capsule [id number] Yes Take 0.4 mg by mouth daily after supper.  [provider] Taking Active   traMADol (ULTRAM) 50 MG tablet [id number] Yes Take 50 mg by mouth every 6 (six) hours as needed. [provider] Taking Active   traZODone (DESYREL) 100 MG tablet [id number] Yes Take 100 mg by mouth at bedtime. [provider] Taking Active           Assessment: Drugs sorted by system:  Neurologic/Psychologic: trazodone  Hematologic: apixaban  Cardiovascular: atorvastatin, furosemide, NTG SL  Pulmonary/Allergy: albuterol nebulizer, albuterol inhaler, Trelegy Ellipta inhaler, ipratropium NS  Gastrointestinal: dicyclomine, omeprazole  Endocrine: metformin  Pain: tramadol  Genitourinary: tamsulosin  Miscellaneous: allopurinol   Medication Assistance Findings:  Medication assistance needs identified: Trelegy and Ventolin, Eliquis  Extra Help:  Already receiving Partial Extra Help Low Income Subsidy (75%)  Patient Assistance Programs: Ventolin and Trelegy made by [employer] o Income requirement met: Yes o Out-of-pocket prescription expenditure met:   Yes - Patient has met application requirements to apply for this patient assistance program.     Eliquis made by [employer] o Income requirement met: Yes o Out-of-pocket prescription expenditure met:   Yes - Patient has met application requirements to apply for this patient assistance program.     Reviewed patient assistance programs with patient.  He is agreeable to apply.    Additional medication assistance options reviewed with patient as warranted:  Insurance OTC catalogue -already using  Plan: . I will route patient assistance letter to [place] technician who will coordinate patient assistance program application process for medications listed above.  [facility] pharmacy technician will assist with obtaining  all required documents from both patient and provider(s) and submit application(s) once completed.    Ralene Bathe, PharmD, [place] [phone]

## 2019-03-21 ENCOUNTER — Other Ambulatory Visit: Payer: Self-pay | Admitting: Pharmacy Technician

## 2019-03-21 ENCOUNTER — Telehealth: Payer: Self-pay | Admitting: *Deleted

## 2019-03-21 ENCOUNTER — Encounter: Payer: Self-pay | Admitting: *Deleted

## 2019-03-21 NOTE — Patient Outreach (Signed)
[place] [facility]) Care Management  03/21/2019  Juan Valdez 17-Nov-1945 [id number]                          Medication Assistance Referral  Referral From: [place]  Medication/Company: Eliquis / [place] Patient application portion:  Mailed Provider application portion: Faxed  to Dr. Geraldo Pitter  Medication/Company: Trelegy and Ventolin HFA / [place] Patient application portion:  Mailed Provider application portion: Faxed  to B. Warner Mccreedy, NP   Follow up:  Will follow up with patient in 7-10 business days to confirm application(s) have been received.  Maud Deed Chana Bode [place] Certified Pharmacy Technician [place] Management Direct [phone]

## 2019-03-21 NOTE — Telephone Encounter (Signed)
Telephone call to patient . Informed of stable lab results . Copy sent to PCP per Dr. Docia Furl request

## 2019-03-21 NOTE — Telephone Encounter (Signed)
-----   Message from Jenean Lindau, MD sent at 03/09/2019  7:59 AM EDT ----- The results of the study is unremarkable. Please inform patient. I will discuss in detail at next appointment. Cc  primary care/referring physician.  He will discuss non-cardiology issues with his primary care Jenean Lindau, MD 03/09/2019 7:58 AM

## 2019-03-22 ENCOUNTER — Ambulatory Visit: Payer: Medicare Other | Admitting: Pharmacist

## 2019-03-23 ENCOUNTER — Other Ambulatory Visit: Payer: Self-pay | Admitting: Emergency Medicine

## 2019-03-26 DIAGNOSIS — H524 Presbyopia: Secondary | ICD-10-CM | POA: Diagnosis not present

## 2019-03-27 ENCOUNTER — Other Ambulatory Visit: Payer: Self-pay | Admitting: Pharmacy Technician

## 2019-03-27 ENCOUNTER — Encounter: Payer: Self-pay | Admitting: Gastroenterology

## 2019-03-27 NOTE — Patient Outreach (Signed)
[facility]) Care Management  03/27/2019  [place] [birth year] [id number]    Incoming call from patient regarding patient assistance application(s) for Trelegy, Ventolin and Eliquis , HIPAA identifiers verified. Mr. Wiedemann confirms that he received patient assistance applications. He states he will gather all required documents and hopes to have it in the mail by the weekend.  Follow up:  Will submit to companies once documents have been received.  Maud Deed Chana Bode [place] Certified Pharmacy Technician [place] Management Direct [phone]

## 2019-03-27 NOTE — Progress Notes (Signed)
Review of outside records to be scanned into the chart  02/02/2016 nurse visit GI Came in for capsule study  03/01/2016 note by Angelene Giovanni PillCam her testing showed gastritis, duodenal bulb nonbleeding AVM, and polyps and was incomplete and suboptimal due to intraluminal contents and the capsule did not enter the colon Plan follow-up with hematology for further elicit transfusions and ongoing/recurrent blood loss.  Plan to follow-up with Dr.Gular in 3 months with likely repeat of EGD/colonoscopy reviewed with patient need for possible repeat PillCam.  06/04/2016 note by Dr. Fabiola Backer Here for follow-up after CT and capsule endoscopy and panendoscopy for cervical GI bleeding and severe anemia in setting of chronic anticoagulation.  Nonbleeding AVMs.  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  July 22, 2016 EGD Chronic diffuse gastritis with scattered erosions and inflammatory appearing antral mucosal nodules. 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 Recurrence of adenomatous appearing duodenal polyp on the inferior aspect of the duodenal bulb. 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 Recommendation to have a 98-monthfollow-up EGD  September 2018 EGD Hypopharynx normal Esophagus normal GE junction at 40 and normal Stomach chronic diffuse nodular gastritis with 2 prominent mucosal nodules in the antrum on the lesser curvature.  Biopsies taken.  No ulcers or masses in the forward view upon retroflexion. 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  Biopsies taken.  Adenomatous appearing polyp on the anterior superior aspect of the duodenal bulb removed by cold  snare.  1 clip placed the site to prevent bleeding.  Additional polyp in the second portion of the duodenum with a narrow base removed by cold snare.  Post polypectomy appearance of the site satisfactory  September 2018 colonoscopy Redundant spastic colon.  History of sigmoid resection with normal-appearing anastomosis.  Pandiverticulosis.  3 mm sessile polyp in the rectum removed by cold snare.  External hemorrhoids.  Preparation was adequate to find polyps greater than 6 mm.  TI visualized 2 cm with mucosa appearing normal.  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 that is not affecting his quality of life Fatty liver risk factors noted with CT scan not noting fatty liver per 2018 January report Patient to continue MiraLAX and begin Dulcolax suppository needs reported EGD/colonoscopy in 3 years Lab slip given for liver test work-up  Admission in December 2018 to the hospital with findings of acute enteritis with partial small bowel obstruction with differential suggesting acute infectious enteritis versus angioedema related to an ARB which is subsequently been discontinued with patient showing improvement with eventual plan for a CT or MR enterography in 4 to 6 weeks  January 2019 GI follow-up note Patient reported to be feeling better post his hospitalization.  He was managed conservatively with antibiotics and no need for surgery.  Was going to get follow-up imaging to document chronicity versus resolution Based on a CT enterography they were been ordered video capsule endoscopy  September  2019 Reportedly had a CT  enterography showing no evidence of inflammatory bowel disease or neoplasia and no evidence of small bowel obstruction. A video capsule study was attempted but the capsule remained in the stomach for 8 hours.  He had a gastric emptying study in June 2019 which showed a marginal delay at 4 hours.  He has been prescribed hyoscyamine for her abdominal pain which helped.  He is no longer taking at that time and was placed on Bentyl and uses times of attacks.  Having anal seepage again with passage of flatus.  Was not doing suppositories and has remained on a low fiber diet since December 2018. Reported CT Enterography Results Duodenal Diverticulum.  Small Bowel Is Otherwise Unremarkable without Evidence of Segmental Obstruction.  Extensive Colonic Diverticulosis.  Fat-Containing Right Inguinal Hernia. Is Minimal Delay after 4 Hours of Gastric Emptying the Remainder of the Study Is within Normal Limits. Laboratories Reportedly Negative for Hep B Surface Antigen, Ceruloplasmin Normal, AMA Negative.  Alkaline Phosphatase Isoenzymes Suggest Bone Origin  Reports of Records EGD/Colonoscopy in September 2018 CT Abdomen Pelvis in January 2018 CT enterography in May 2017 EGD in November 2017 EGD/colonoscopy in July 2017 Capsule endoscopy in May 2017  2017 colonoscopy pathology Right colon polyps shows sessile serrated adenoma Left colon polyp tubular adenoma Duodenum with fragments of tubular adenoma as well as hyperplastic Brunner's gland   The other 39 pages of notes are laboratories which will be reviewed if patient ends up coming to clinic.  The rest of these notes (>80 pages) will not be reviewed in entirety at this point but they will be available and scanned into the chart for review in future if necessary.   Juan Britain, MD [facility]oscopy Office # [phone]

## 2019-03-28 ENCOUNTER — Encounter: Payer: Self-pay | Admitting: Cardiology

## 2019-03-28 ENCOUNTER — Telehealth: Payer: Self-pay | Admitting: Emergency Medicine

## 2019-03-28 ENCOUNTER — Other Ambulatory Visit: Payer: Self-pay | Admitting: Pharmacy Technician

## 2019-03-28 ENCOUNTER — Telehealth (INDEPENDENT_AMBULATORY_CARE_PROVIDER_SITE_OTHER): Payer: Medicare Other | Admitting: Cardiology

## 2019-03-28 ENCOUNTER — Other Ambulatory Visit: Payer: Self-pay

## 2019-03-28 VITALS — BP 164/78 | HR 69 | Ht 69.0 in | Wt 185.0 lb

## 2019-03-28 DIAGNOSIS — I4821 Permanent atrial fibrillation: Secondary | ICD-10-CM | POA: Diagnosis not present

## 2019-03-28 DIAGNOSIS — E088 Diabetes mellitus due to underlying condition with unspecified complications: Secondary | ICD-10-CM

## 2019-03-28 DIAGNOSIS — Z87891 Personal history of nicotine dependence: Secondary | ICD-10-CM

## 2019-03-28 DIAGNOSIS — I2510 Atherosclerotic heart disease of native coronary artery without angina pectoris: Secondary | ICD-10-CM

## 2019-03-28 DIAGNOSIS — E782 Mixed hyperlipidemia: Secondary | ICD-10-CM

## 2019-03-28 DIAGNOSIS — I10 Essential (primary) hypertension: Secondary | ICD-10-CM

## 2019-03-28 NOTE — Patient Instructions (Signed)

## 2019-03-28 NOTE — Patient Outreach (Signed)
[place] [facility]) Care Management  03/28/2019  [place] 03/27/1946 [id number]   Incoming call from patient stating that he obtained his printouts from pharmacies and he has only spent about $311 OOP.   Will route note to Roland Summe to inform that patient has not met OOP requirements for programs.  Maud Deed Chana Bode [place] Certified Pharmacy Technician [place] Management Direct Dial:[phone]

## 2019-03-28 NOTE — Progress Notes (Signed)
Virtual Visit via Video Note   This visit type was conducted due to national recommendations for restrictions regarding the COVID-19 Pandemic (e.g. social distancing) in an effort to limit this patient's exposure and mitigate transmission in our community.  Due to his co-morbid illnesses, this patient is at least at moderate risk for complications without adequate follow up.  This format is felt to be most appropriate for this patient at this time.  All issues noted in this document were discussed and addressed.  A limited physical exam was performed with this format.  Please refer to the patient's chart for his consent to telehealth for [facility].   Date:  03/28/2019   ID:  Juan Valdez, DOB 1945/10/05, MRN [id number]  Patient Location: Home Provider Location: Office  PCP:  Ronita Hipps, MD  Cardiologist:  No primary care provider on file.  Electrophysiologist:  None   Evaluation Performed:  Follow-Up Visit  Chief Complaint:  atrial fibrillation  History of Present Illness:    Juan Valdez is a 73 y.o. male with past medical history of essential hypertension, atrial fibrillation.  Patient was found to be on the bradycardic side and beta-blocker was withheld.  He tells me he feels much better since.  No chest pain orthopnea or PND.  He takes care of activities of daily living.  He is a former smoker.  At the time of my evaluation, the patient is alert awake oriented and in no distress.  The patient does not have symptoms concerning for COVID-19 infection (fever, chills, cough, or new shortness of breath).    Past Medical History:  Diagnosis Date  . Anemia   . Atrial fibrillation ([place]) 09/07/2018  . Atrial fibrillation ([place])   . Diabetes ([place])   . Gout   . Lung cancer [facility])    Past Surgical History:  Procedure Laterality Date  . ABDOMINAL SURGERY    . ROTATOR CUFF REPAIR       Current Meds  Medication Sig  . albuterol (PROVENTIL) (2.5 MG/3ML) 0.083%  nebulizer solution Take 3 mLs (2.5 mg total) by nebulization every 6 (six) hours as needed for up to 4 doses for wheezing or shortness of breath.  Marland Kitchen albuterol (VENTOLIN HFA) 108 (90 Base) MCG/ACT inhaler Use 2 puffs every 4-6 hours as needed for wheezing or shortness of breath  . allopurinol (ZYLOPRIM) 300 MG tablet Take 300 mg by mouth daily.  Marland Kitchen apixaban (ELIQUIS) 5 MG TABS tablet Take 1 tablet (5 mg total) by mouth 2 (two) times daily.  Marland Kitchen atorvastatin (LIPITOR) 40 MG tablet Take 40 mg by mouth daily.  Marland Kitchen dicyclomine (BENTYL) 20 MG tablet Take 20 mg by mouth every 6 (six) hours as needed.   . Fluticasone-Umeclidin-Vilant (TRELEGY ELLIPTA) 100-62.5-25 MCG/INH AEPB Inhale 1 puff into the lungs daily.  . furosemide (LASIX) 20 MG tablet Take 20 mg (1 Tab) every other day  . ipratropium (ATROVENT) 0.03 % nasal spray Place 2 sprays into both nostrils every 12 (twelve) hours as needed for rhinitis.   . metFORMIN (GLUMETZA) 500 MG (MOD) 24 hr tablet Take 500 mg by mouth 2 (two) times daily.  . nitroGLYCERIN (NITROSTAT) 0.4 MG SL tablet Place 0.4 mg under the tongue every 5 (five) minutes as needed for chest pain.  Marland Kitchen omeprazole (PRILOSEC) 40 MG capsule Take 40 mg by mouth 2 (two) times daily.  . tamsulosin (FLOMAX) 0.4 MG CAPS capsule Take 0.4 mg by mouth daily after supper.   . traMADol Veatrice Bourbon)  50 MG tablet Take 50 mg by mouth every 6 (six) hours as needed.  . traZODone (DESYREL) 100 MG tablet Take 100 mg by mouth at bedtime.     Allergies:   Patient has no known allergies.   Social History   Tobacco Use  . Smoking status: Former Smoker    Packs/day: 3.00    Years: 57.00    Pack years: 171.00    Types: Cigarettes    Start date: 15    Quit date: 06/13/2014    Years since quitting: 4.7  . Smokeless tobacco: Never Used  Substance Use Topics  . Alcohol use: Not on file  . Drug use: Not on file     Family Hx: The patient's family history is not on file. He was adopted.  ROS:   Please see  the history of present illness.    As mentioned above All other systems reviewed and are negative.   Prior CV studies:   The following studies were reviewed today:  None  Labs/Other Tests and Data Reviewed:    EKG:  No ECG reviewed.  Recent Labs: 03/08/2019: ALT 28; BUN 21; Creatinine, Ser 1.21; Hemoglobin 16.0; Platelets 146; Potassium 4.7; Sodium 139; TSH 1.080   Recent Lipid Panel Lab Results  Component Value Date/Time   CHOL 140 03/08/2019 09:07 AM   TRIG 49 03/08/2019 09:07 AM   HDL 44 03/08/2019 09:07 AM   CHOLHDL 3.2 03/08/2019 09:07 AM   LDLCALC 86 03/08/2019 09:07 AM    Wt Readings from Last 3 Encounters:  03/28/19 185 lb (83.9 kg)  03/08/19 189 lb 12.8 oz (86.1 kg)  01/25/19 180 lb (81.6 kg)     Objective:    Vital Signs:  BP (!) 164/78 (BP Location: Left Arm, Patient Position: Sitting, Cuff Size: Normal)   Pulse 69   Ht 5\' 9"  (1.753 m)   Wt 185 lb (83.9 kg)   SpO2 99%   BMI 27.32 kg/m    VITAL SIGNS:  reviewed  ASSESSMENT & PLAN:    1. Atrial fibrillation: Permanent:I discussed with the patient atrial fibrillation, disease process. Management and therapy including rate and rhythm control, anticoagulation benefits and potential risks were discussed extensively with the patient. Patient had multiple questions which were answered to patient's satisfaction. 2. Essential hypertension: Blood pressure stable 3. Patient will be seen in follow-up appointment in 6 months or earlier if the patient has any concerns   COVID-19 Education: The signs and symptoms of COVID-19 were discussed with the patient and how to seek care for testing (follow up with PCP or arrange E-visit).  The importance of social distancing was discussed today.  Time:   Today, I have spent 15 minutes with the patient with telehealth technology discussing the above problems.     Medication Adjustments/Labs and Tests Ordered: Current medicines are reviewed at length with the patient today.   Concerns regarding medicines are outlined above.   Tests Ordered: No orders of the defined types were placed in this encounter.   Medication Changes: No orders of the defined types were placed in this encounter.   Follow Up:  Virtual Visit or In Person 4 mo  Signed, Jenean Lindau, MD  03/28/2019 10:24 AM    [employer]

## 2019-03-28 NOTE — Telephone Encounter (Signed)
Called & spoke w/ pt. Pt states he kept Symbicort and Spiriva and inquires if he should take these inhalers instead of the Trelegy. I let pt know according to LOV notes from [facility] that he is to take Trelegy 1 puff daily only. Pt verbalized understanding. I asked pt if Trelegy was working well for him and he stated Trelegy has been highly therapeutic for him, but he wasn't sure if he should let Symbicort and Spiriva go to waste. I reinforced to pt that he should go with what [name] recommended--taking the Trelegy only as it "contains 3 medications to help manage his respiratory status." Pt expressed understanding and states he has plenty of refills of Trelegy left and is not having trouble affording the medication.   I verified pt's next appt scheduled on 04/05/2019 -- PFT at 11:00 AM followed by a face-to-face visit with [name] at 12:00. Pt verbalized understanding. Nothing further needed at this time.

## 2019-03-29 ENCOUNTER — Other Ambulatory Visit: Payer: Self-pay | Admitting: Pharmacist

## 2019-03-29 NOTE — Patient Outreach (Signed)
[place] [facility]) Care Management [place]  03/29/2019  [place] 06/09/1946 [id number]  Reason for referral: medication assistance  Communication received from [place] technician that patient has not spent enough out-of-pocket expenditure to qualify for [place] and [facility] patient assistance programs.   Successful call with patient this morning.  We discussed requirements of programs and possible substitutions to other medications for Trelegy.  Patient prefers to stay on Trelegy and albuterol nebulizer at this time and states he can still afford it.   TROOP will be $600 for [place] program. He also has at least 2 month supply of Eliquis and states he can also afford this co-pay in Sept/Oct when refill needed.  The 3% TROOP for [facility] will be ~ $630 based on estimated annual income.  Patient aware.  He will reach out to me after he has spent required TROOP amounts for medications.    [facility] pharmacy case is being closed due to the following reasons:  -No further medication assistance questions at this time -I have provided my contact information if patient or family needs to reach out to me in the future.  -Thank you for allowing [facility] pharmacy to be involved in this patient's care.    Ralene Bathe, PharmD, [place] [phone]

## 2019-04-02 ENCOUNTER — Other Ambulatory Visit (HOSPITAL_COMMUNITY): Admission: RE | Admit: 2019-04-02 | Payer: Medicare Other | Source: Ambulatory Visit

## 2019-04-02 ENCOUNTER — Encounter: Payer: Self-pay | Admitting: Gastroenterology

## 2019-04-02 NOTE — Telephone Encounter (Signed)
Recorded have been received and patient will be contact to be scheduled.

## 2019-04-04 ENCOUNTER — Telehealth: Payer: Self-pay | Admitting: Pulmonary Disease

## 2019-04-04 DIAGNOSIS — J441 Chronic obstructive pulmonary disease with (acute) exacerbation: Secondary | ICD-10-CM

## 2019-04-04 MED ORDER — ALBUTEROL SULFATE HFA 108 (90 BASE) MCG/ACT IN AERS: INHALATION_SPRAY | RESPIRATORY_TRACT | 3 refills | Status: DC

## 2019-04-04 MED ORDER — TRELEGY ELLIPTA 100-62.5-25 MCG/INH IN AEPB: 1.0000 | INHALATION_SPRAY | Freq: Every day | RESPIRATORY_TRACT | 3 refills | Status: DC

## 2019-04-04 NOTE — Telephone Encounter (Signed)
Received fax from [place] [facility]:  "Patient meets eligibility for patient assistance with [place].  Please fax back a valid prescription for the following medications: Trelegy and Ventolin HFA."  Last office visit 7.1.2020 with Warner Mccreedy NP Prescriptions printed for [facility] NP to sign.

## 2019-04-05 ENCOUNTER — Ambulatory Visit: Payer: Medicare Other | Admitting: Pulmonary Disease

## 2019-04-05 DIAGNOSIS — K589 Irritable bowel syndrome without diarrhea: Secondary | ICD-10-CM | POA: Diagnosis not present

## 2019-04-05 LAB — COLOGUARD

## 2019-04-06 DIAGNOSIS — K219 Gastro-esophageal reflux disease without esophagitis: Secondary | ICD-10-CM | POA: Diagnosis not present

## 2019-04-06 NOTE — Telephone Encounter (Signed)
This has been done and faxed back to [facility] attn Ahley Will sign off

## 2019-04-12 ENCOUNTER — Telehealth: Payer: Self-pay | Admitting: Cardiology

## 2019-04-12 NOTE — Telephone Encounter (Signed)
Patient continues to have shortness of breath, should he do nuclear test?

## 2019-04-12 NOTE — Telephone Encounter (Signed)
Discussed upcoming lexi appt with patient and answered his questions.

## 2019-04-26 DIAGNOSIS — Z79899 Other long term (current) drug therapy: Secondary | ICD-10-CM | POA: Diagnosis not present

## 2019-04-26 DIAGNOSIS — R609 Edema, unspecified: Secondary | ICD-10-CM | POA: Diagnosis not present

## 2019-05-02 DIAGNOSIS — E119 Type 2 diabetes mellitus without complications: Secondary | ICD-10-CM | POA: Diagnosis not present

## 2019-05-02 DIAGNOSIS — M109 Gout, unspecified: Secondary | ICD-10-CM | POA: Diagnosis not present

## 2019-05-02 DIAGNOSIS — K589 Irritable bowel syndrome without diarrhea: Secondary | ICD-10-CM | POA: Diagnosis not present

## 2019-05-03 ENCOUNTER — Encounter: Payer: Self-pay | Admitting: Gastroenterology

## 2019-05-03 ENCOUNTER — Ambulatory Visit (INDEPENDENT_AMBULATORY_CARE_PROVIDER_SITE_OTHER): Payer: Medicare Other | Admitting: Gastroenterology

## 2019-05-03 ENCOUNTER — Other Ambulatory Visit: Payer: Medicare Other

## 2019-05-03 VITALS — BP 130/60 | HR 64 | Temp 97.9°F | Ht 68.25 in | Wt 188.4 lb

## 2019-05-03 DIAGNOSIS — D132 Benign neoplasm of duodenum: Secondary | ICD-10-CM

## 2019-05-03 DIAGNOSIS — R140 Abdominal distension (gaseous): Secondary | ICD-10-CM | POA: Diagnosis not present

## 2019-05-03 DIAGNOSIS — Z86010 Personal history of colonic polyps: Secondary | ICD-10-CM | POA: Diagnosis not present

## 2019-05-03 DIAGNOSIS — K581 Irritable bowel syndrome with constipation: Secondary | ICD-10-CM | POA: Diagnosis not present

## 2019-05-03 DIAGNOSIS — R1084 Generalized abdominal pain: Secondary | ICD-10-CM | POA: Diagnosis not present

## 2019-05-03 DIAGNOSIS — K4021 Bilateral inguinal hernia, without obstruction or gangrene, recurrent: Secondary | ICD-10-CM

## 2019-05-03 DIAGNOSIS — Z8774 Personal history of (corrected) congenital malformations of heart and circulatory system: Secondary | ICD-10-CM

## 2019-05-03 NOTE — Patient Instructions (Addendum)
You have been given a testing kit to check for small intestine bacterial overgrowth (SIBO) which is completed by a company named [facility]. Make sure to return your test in the mail using the return mailing label given you along with the kit. Your demographic and insurance information have already been sent to the company and they should be in contact with you over the next week regarding this test. Please keep in mind that you will be getting a call from phone number [phone] or a similar number. If you do not hear from them within this time frame, please call our office at [phone].   Start Fibercon 1-2 tablets daily.   It has been recommended to you by your physician that you have a(n) Endo/LEC  completed. Per your request, we did not schedule the procedure(s) today. Please contact our office at [phone] should you decide to have the procedure completed.   Your provider has requested that you go to the basement level for lab work before leaving today. Press "B" on the elevator. The lab is located at the first door on the left as you exit the elevator.   Thank you for choosing me and [facility].  Dr. Rush Landmark

## 2019-05-03 NOTE — Progress Notes (Addendum)
[facility] VISIT   Primary Care Provider Ronita Hipps, MD [address] [place] [address] [phone]  Referring Provider Ronita Hipps, MD [place] [address],  [phone]  Patient Profile: Juan Valdez is a 73 y.o. male with a pmh significant for CAD, A. fib (on anticoagulation) COPD, diverticulosis, colon polyps, duodenal polyps (adenomas), IBS-C, hypertension, hyperlipidemia, previous iron deficiency anemia (possible AVMs on prior capsule endoscopy), prior acute enteritis with partial small bowel obstruction (unclear etiology likely viral not believed to be IBD).  The patient presents to the [facility] for an evaluation and management of problem(s) noted below:  Problem List 1. Generalized abdominal pain   2. Abdominal bloating   3. Abdominal distension   4. Irritable bowel syndrome with constipation   5. Hx of colonic polyps   6. Adenomatous duodenal polyp   7. Hx of arteriovenous malformation (AVM)   8. Bilateral recurrent inguinal hernia without obstruction or gangrene     History of Present Illness This is the patient's first visit to the outpatient [place] [facility] clinic.  Was previously followed in [place] by Dr. Berton Lan since at least 2017.  I have previously outlined some of his notation in prior documentation.  In brief, in 2017 the patient was dealing with iron deficiency anemia proceeding with endoscopic evaluation and subsequent capsule endoscopy evaluation.  1 capsule endoscopy did not pass through the stomach another was able to pass and there were findings of angiectasia's.  Repeat an upper endoscopy was not able to reach angioectasia.  He was started on iron and then given IV iron for repletion.  After period in time in 2017 2018 his iron deficiency anemia improved.  He no longer had issues after 2019.  He has had some abnormal liver tests in the past including elevation in alkaline  phosphatase which has previously thought to be bone related as he had a normal GGT but fractionation never occurred.  He has had multiple endoscopies and colonoscopies.  His last ones in 2018 showed adenomas in both regions from above and below.  Looking through notes he has had a longstanding issue of irritable bowel syndrome constipation and has been on MiraLAX as well as Bentyl and has even required suppositories at times.  Cross-sectional imaging has shown evidence of inguinal hernia after he had acute enteritis in 2018 into 2019 leading to a hospitalization for partial small bowel obstruction.  He eventually healed and repeat imaging showed no evidence of persistent enteritis.  The concern for inflammatory bowel disease was much less at that point.  The patient moved with his fiancee to [place] in 2019 and had establish GI care in [place].  However after 1 or 2 visits the patient did not feel that office was offering him the care that he desired so he decided not to pursue further care with them.  Today the patient comes and states that he is doing relatively well with very minimal amounts of pain.  He does get cramping throughout his abdomen with abdominal distention that occurs 2-3 times a month.  When this does occur the discomfort can last for 1 to 3 days.  He will take pain medications and may even take an opioid or tramadol to try and help with the pain.  He puts heating packs as well.  He does get indigestion nightly but especially if he does not eats properly.  To help with his bowel movements and try to have at  least 1/day he takes MiraLAX on a nightly basis.  He does not use any fiber supplements.  The patient has no nausea or vomiting.  Is feeling well.  Takes Bentyl as needed which can help with spasms and cramping at times.  Does not need refills on any of those medications.  GI Review of Systems Positive as above Negative for dysphagia, odynophagia, change in bowel habits, melena,  hematochezia  Review of Systems General: Denies fevers/chills/weight loss HEENT: Denies oral lesions Cardiovascular: Denies chest pain/palpitations Pulmonary: Denies shortness of breath/nocturnal cough Gastroenterological: See HPI Genitourinary: Denies darkened urine Hematological: Denies easy bruising/bleeding Endocrine: Denies temperature intolerance Dermatological: Denies jaundice Psychological: Mood is stable   Medications Current Outpatient Medications  Medication Sig Dispense Refill   albuterol (PROVENTIL) (2.5 MG/3ML) 0.083% nebulizer solution Take 3 mLs (2.5 mg total) by nebulization every 6 (six) hours as needed for up to 4 doses for wheezing or shortness of breath. 1080 mL 0   albuterol (VENTOLIN HFA) 108 (90 Base) MCG/ACT inhaler Use 2 puffs every 4-6 hours as needed for wheezing or shortness of breath 24 g 3   allopurinol (ZYLOPRIM) 300 MG tablet Take 300 mg by mouth daily.     apixaban (ELIQUIS) 5 MG TABS tablet Take 1 tablet (5 mg total) by mouth 2 (two) times daily. 180 tablet 1   atorvastatin (LIPITOR) 40 MG tablet Take 40 mg by mouth daily.     dicyclomine (BENTYL) 10 MG capsule Take 1 capsule by mouth every 6 (six) hours as needed.     Fluticasone-Umeclidin-Vilant (TRELEGY ELLIPTA) 100-62.5-25 MCG/INH AEPB Inhale 1 puff into the lungs daily. 3 each 3   furosemide (LASIX) 20 MG tablet Take 20 mg (1 Tab) every other day 90 tablet 1   ipratropium (ATROVENT) 0.03 % nasal spray Place 2 sprays into both nostrils every 12 (twelve) hours as needed for rhinitis.      metFORMIN (GLUMETZA) 500 MG (MOD) 24 hr tablet Take 500 mg by mouth 2 (two) times daily.     metoprolol succinate (TOPROL-XL) 25 MG 24 hr tablet Take 25 mg by mouth daily.     nitroGLYCERIN (NITROSTAT) 0.4 MG SL tablet Place 0.4 mg under the tongue every 5 (five) minutes as needed for chest pain.     tamsulosin (FLOMAX) 0.4 MG CAPS capsule Take 0.4 mg by mouth daily after supper.      traMADol  (ULTRAM) 50 MG tablet Take 50 mg by mouth every 6 (six) hours as needed.     traZODone (DESYREL) 100 MG tablet Take 100 mg by mouth at bedtime.     omeprazole (PRILOSEC) 40 MG capsule Take 40 mg by mouth 2 (two) times daily.     No current facility-administered medications for this visit.     Allergies No Known Allergies   Histories Past Medical History:  Diagnosis Date   Anemia    Atrial fibrillation ([facility]) 09/07/2018   CAD (coronary artery disease)    Colon polyp    COPD (chronic obstructive pulmonary disease) ([facility])    Diabetes ([facility])    Diverticulosis    GERD (gastroesophageal reflux disease)    Gout    HLD (hyperlipidemia)    Lung cancer ([facility])    Perforated bowel ([place])    Past Surgical History:  Procedure Laterality Date   ABDOMINAL SURGERY     CORONARY ANGIOPLASTY WITH STENT PLACEMENT  2003   LUNG CANCER SURGERY     ROTATOR CUFF REPAIR Left    TONSILLECTOMY  age 55   Social History   Socioeconomic History   Marital status: Unknown    Spouse name: Not on file   Number of children: 0   Years of education: Not on file   Highest education level: Not on file  Occupational History   Occupation: retired  Scientist, product/process development strain: Not on file   Food insecurity    Worry: Not on file    Inability: Not on Lexicographer needs    Medical: Not on file    Non-medical: Not on file  Tobacco Use   Smoking status: Former Smoker    Packs/day: 3.00    Years: 57.00    Pack years: 171.00    Types: Cigarettes    Start date: 1958    Quit date: 06/13/2014    Years since quitting: 4.8   Smokeless tobacco: Never Used  Substance and Sexual Activity   Alcohol use: Not on file   Drug use: Not on file   Sexual activity: Not on file  Lifestyle   Physical activity    Days per week: Not on file    Minutes per session: Not on file   Stress: Not on file  Relationships   Social connections    Talks on phone: Not on  file    Gets together: Not on file    Attends religious service: Not on file    Active member of club or organization: Not on file    Attends meetings of clubs or organizations: Not on file    Relationship status: Not on file   Intimate partner violence    Fear of current or ex partner: Not on file    Emotionally abused: Not on file    Physically abused: Not on file    Forced sexual activity: Not on file  Other Topics Concern   Not on file  Social History Narrative   Not on file   Family History  Adopted: Yes   I have reviewed his medical, social, and family history in detail and updated the electronic medical record as necessary.    PHYSICAL EXAMINATION  BP 130/60 (BP Location: Left Arm, Patient Position: Sitting, Cuff Size: Normal)    Pulse 64 Comment: irregular   Temp 97.9 F (36.6 C)    Ht 5' 8.25" (1.734 m) Comment: height measured without shoes   Wt 188 lb 6 oz (85.4 kg)    BMI 28.43 kg/m  Wt Readings from Last 3 Encounters:  05/03/19 188 lb 6 oz (85.4 kg)  03/28/19 185 lb (83.9 kg)  03/08/19 189 lb 12.8 oz (86.1 kg)  GEN: NAD, doesn't appear chronically ill PSYCH: Cooperative, without pressured speech EYE: Conjunctivae pink, sclerae anicteric ENT: MMM, without oral ulcers, no erythema or exudates noted NECK: Supple CV: Non-tachycardic RESP: Some wheezing apparent GI: NABS, soft, protuberant abdomen, ventral diastases hernia present, right-sided hernia noted, umbilical hernia, without rebound or guarding, unable to appreciate HSM MSK/EXT: Trace bilateral lower extremity edema SKIN: No jaundice NEURO:  Alert & Oriented x 3, no focal deficits   REVIEW OF DATA  I reviewed the following data at the time of this encounter:  GI Procedures and Studies  2018 EGD 2 polyps removed in the duodenum.  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  Nodular gastritis.  2018 colonoscopy Diminutive polyp removed.  Pandiverticulosis.  History of  sigmoid resection with normal-appearing anastomosis.  External hemorrhoids.  Suboptimal preparation.  2018 pathology Duodenal polyp shows  small intestine mucosa with adenoma no high-grade dysplasia. Duodenal nodule showed ulcerated small intestine mucosa with hyperplastic Brunner's. Antral nodule shows polypoid fragments of gastric mucosa and regenerative glandular superficial glands being seen Random colon biopsies showed no colonic mucosa or active cryptitis Rectal polyp tubular adenoma Patient July 2017 pathology Colon pathology showing evidence of sessile serrated adenoma and tubular adenomas Duodenum pathology showing fragments of tubular adenoma and hyperplastic Brunner's gland  November 2017 pathology Duodenal nodule with fragments of antral mucosa and vegetable material. Duodenal polyp returned as tubular adenoma Antral nodule returned as foveolar hyperplasia  Laboratory Studies  October 2017 GGT 67 (within normal limits Hepatitis B surface antibody nonreactive Hepatitis B surface antigen nonreactive Hepatitis C negative Hepatitis A total negative Hepatitis B core total nonreactive Ceruloplasmin 33.5 (upper limit normal 31) AMA negative  January 2018 labs Creatinine 1.14 August 2017 labs WBC 12.7 Hemoglobin 14.6 Hematocrit 44.2 Platelets 136 Lipase 74 AST 13 ALT 30 Alk phos 95 Total bili 0.89 INR 1.0   Imaging Studies  May 2017 CT enterography Diverticulosis Fatty liver with focal sparing Edematous changes around gallbladder that are nonspecific Prostate gland enlargement Bilateral fat filled inguinal hernia Bilateral renal cysts Coronary artery calcifications  October 2017 KUB Nonspecific bowel gas pattern. Distended stomach. Moderate amount of stool throughout the colon October 2017 ultrasound abdomen Gallbladder is identified without stones.  Gallbladder wall is thickened at 4 mm.  Negative Murphy's.  No fluid around the gallbladder.  CBD 6 mm.   No ductal dilation.  Liver enlarged at 21 cm and echogenic with fat.  Pancreas is limited evaluation.  No ascites.  January 2018 CT abdomen pelvis No evidence for acute intra-abdominal pathology or pelvic abnormality. Stable prominent periportal lymph nodes. Diverticulosis without diverticulitis. Prostate enlargement. Mild diffuse thickening of the bladder wall which is nonspecific  December 2018 CT abdomen pelvis 10 to 15 mm segment of proximal to mid ileum which demonstrates bowel wall thickening and luminal narrowing consistent with enteritis.  A second smaller segment is seen more distally.  This is associated with a component of small bowel obstruction.  Differential including inflammatory bowel disease, ischemia also possible. Diverticulosis without diverticulitis. Prostate enlargement. Bilateral renal cyst. Small nonobstructing right renal calculus  February 2019 CT enterography Duodenal diverticulum.  Small bowel is otherwise unremarkable without evidence of segmental wall thickening or obstruction. Extensive colonic diverticulosis. Fat-containing inguinal hernia  March 2019 KUB Gaseous distention of bowel loops with multiple air-fluid levels query ileus or early obstruction cannot be excluded Moderate amount of feces in colon  June 2019 gastric emptying study There is minimal delay after 4 hours of gastric emptying the remainder of the study is within normal limits. At 1 hour 78.1% remained in the stomach.  At 2 hours 53.4% remained in the stomach.  At 3 hours 28.2% remained in the stomach.  At 4 hours 10.7% remained in the stomach.  The upper limits of normal at 4 hours is approximately 10%.  Thus this shows a minimal delay  Outside Records  Patient was seen by Dr. Berton Lan in [facility] in September 2018 for follow-up of iron deficiency anemia and abdominal pain and bowel habit changes.  Patient's impression at the time was for iron deficiency anemia felt attributed to AVMs  on oral iron and prior IV iron and followed with hematology. Adenomatous duodenal polyps with a duodenal adenoma found on September 2018 EGD. Adenomatous colon polyp removed in 2017 and then again in 2018 but prep was suboptimal. Small bowel  AVMs not visualized on prior endoscopies reported on capsule studies. CT scan abnormalities with marginal elevation alkaline phosphatase and previously seen by surgery but he was not a surgical candidate. Anal seepage with concern for constipation and fecal impaction. Fatty liver/[place] cold with concern on prior imaging though January 2018 CT scan suggested no fatty liver. Plan was for MiraLAX and Dulcolax and modification of diet as necessary.  Lab slip given for further work-up of liver test abnormalities. Patient admitted to the hospital in December with possible acute enteritis and partial small bowel obstruction.  Plan was for outpatient CT enterography after completion of antibiotics. January 2019 visit in clinic noted that patient was felt to have possible acute infectious enteritis versus angioedema related to our less likely Crohn's.   ASSESSMENT  Mr. Dube is a 73 y.o. male with a pmh significant for CAD, A. fib (on anticoagulation) COPD, diverticulosis, colon polyps, duodenal polyps (adenomas), IBS-C, hypertension, hyperlipidemia, previous iron deficiency anemia (possible AVMs on prior capsule endoscopy), prior acute enteritis with partial small bowel obstruction (unclear etiology likely viral not believed to be IBD).  The patient is seen today for evaluation and management of:  1. Generalized abdominal pain   2. Abdominal bloating   3. Abdominal distension   4. Irritable bowel syndrome with constipation   5. Hx of colonic polyps   6. Adenomatous duodenal polyp   7. Hx of arteriovenous malformation (AVM)   8. Bilateral recurrent inguinal hernia without obstruction or gangrene    The patient is clinically and hemodynamically stable at this point  in time.  Very complex individual with multiple GI issues over the course the last few years as noted above.  This point thankfully he is doing better.  From what I can gather it does seem like he likely has an underlying component of functional GI disorder most likely IBS-C.  He does have issues of abdominal bloating and distention although no significant diarrhea to go with that.  I wonder if he could have bacterial overgrowth in the setting of his history.  He may also be at risk for pancreas insufficiency.  At this point in time, he will continue on his current regimen of bowel preparation as well as Bentyl to help with spasms.  He is aware that I do not prescribe opioid therapy to my patients and if he were to have significant pain or discomfort that is requiring him to have that type of medication he would not be able to receive it through our office.  With that being said, the patient has a history of colonic and duodenal polyps.  Most recent endoscopies have shown multiple recurrence of polyp.  I am concerned as to whether this is a recurrent polyp or if the patient is having just multiple fragments of new polyps that continue to recur.  His prior endoscopist that suggested a follow-up in 2021.  I think it would be reasonable to consider a upper endoscopy this year but if he wants to wait till 2021 that is fine as he has had evaluation and then be very unlikely that a cancer would develop that quickly and he is not having obstructive symptoms currently.  The patient has a history of colon polyps as well and his last colonoscopy was suboptimal prep and they suggested a 3-year follow-up also to be done in 2021.  If we pursue an endoscopy in 2020 we should consider going ahead and performing an colonoscopy as well since he would technically with a suboptimal  prep require a 1 year colonoscopy.  If he wants to wait till 2021 due to his prior GI providers recommendations then he understands and trusts his prior GI  endoscopist.  We will see how he feels over the course of the coming weeks and follow him up.  Will rule out SIBO and EPI.  No issues in regards to his inguinal hernia though he has multiple hernias on his exam and he does not feel he needs surgical evaluation currently.  Has had prior abnormal appearing gallbladder on imaging but has been told that he is a nonsurgical candidate for cholecystectomy.  Symptoms currently do not seem consistent with cholecystitis or gallbladder pathology and thus will hold on evaluation of this any further currently.  All patient questions were answered, to the best of my ability, and the patient agrees to the aforementioned plan of action with follow-up as indicated.   PLAN  We will obtain labs from PCP office Proceed with ruling out SIBO via breath testing EPI rule out with pancreatic elastase testing Definitely needs an EGD/colonoscopy in 2021 but could consider sooner based on patient's desires Continue Bentyl as needed Aggressive bowel regimen with MiraLAX 1-2 times daily Fiber supplementation daily   Orders Placed This Encounter  Procedures   Pancreatic Elastase, Fecal    New Prescriptions   No medications on file   Modified Medications   No medications on file    Planned Follow Up No follow-ups on file.   Justice Britain, MD [facility] Advanced [facility]oscopy Office # [phone]

## 2019-05-04 ENCOUNTER — Encounter: Payer: Self-pay | Admitting: Gastroenterology

## 2019-05-06 ENCOUNTER — Encounter: Payer: Self-pay | Admitting: Gastroenterology

## 2019-05-06 DIAGNOSIS — R140 Abdominal distension (gaseous): Secondary | ICD-10-CM

## 2019-05-06 DIAGNOSIS — K4021 Bilateral inguinal hernia, without obstruction or gangrene, recurrent: Secondary | ICD-10-CM

## 2019-05-06 DIAGNOSIS — Z860100 Personal history of colon polyps, unspecified: Secondary | ICD-10-CM

## 2019-05-06 DIAGNOSIS — R1084 Generalized abdominal pain: Secondary | ICD-10-CM | POA: Insufficient documentation

## 2019-05-06 DIAGNOSIS — K581 Irritable bowel syndrome with constipation: Secondary | ICD-10-CM

## 2019-05-06 DIAGNOSIS — D132 Benign neoplasm of duodenum: Secondary | ICD-10-CM | POA: Insufficient documentation

## 2019-05-06 DIAGNOSIS — Z8774 Personal history of (corrected) congenital malformations of heart and circulatory system: Secondary | ICD-10-CM | POA: Insufficient documentation

## 2019-05-06 HISTORY — DX: Personal history of colon polyps, unspecified: Z86.0100

## 2019-05-06 HISTORY — DX: Generalized abdominal pain: R10.84

## 2019-05-06 HISTORY — DX: Personal history of (corrected) congenital malformations of heart and circulatory system: Z87.74

## 2019-05-06 HISTORY — DX: Bilateral inguinal hernia, without obstruction or gangrene, recurrent: K40.21

## 2019-05-06 HISTORY — DX: Personal history of colonic polyps: Z86.010

## 2019-05-06 HISTORY — DX: Irritable bowel syndrome with constipation: K58.1

## 2019-05-06 HISTORY — DX: Abdominal distension (gaseous): R14.0

## 2019-05-06 HISTORY — DX: Benign neoplasm of duodenum: D13.2

## 2019-05-08 ENCOUNTER — Emergency Department (HOSPITAL_COMMUNITY): Payer: Medicare Other

## 2019-05-08 ENCOUNTER — Inpatient Hospital Stay (HOSPITAL_COMMUNITY)
Admission: EM | Admit: 2019-05-08 | Discharge: 2019-05-11 | DRG: 390 | Disposition: A | Discharge status: Home / Self Care | Payer: Medicare Other | Attending: Family Medicine | Admitting: Family Medicine

## 2019-05-08 ENCOUNTER — Telehealth: Payer: Self-pay | Admitting: Pulmonary Disease

## 2019-05-08 ENCOUNTER — Encounter (HOSPITAL_COMMUNITY): Payer: Self-pay

## 2019-05-08 ENCOUNTER — Other Ambulatory Visit: Payer: Self-pay

## 2019-05-08 DIAGNOSIS — Z7951 Long term (current) use of inhaled steroids: Secondary | ICD-10-CM

## 2019-05-08 DIAGNOSIS — Z955 Presence of coronary angioplasty implant and graft: Secondary | ICD-10-CM

## 2019-05-08 DIAGNOSIS — J449 Chronic obstructive pulmonary disease, unspecified: Secondary | ICD-10-CM | POA: Diagnosis not present

## 2019-05-08 DIAGNOSIS — E088 Diabetes mellitus due to underlying condition with unspecified complications: Secondary | ICD-10-CM | POA: Diagnosis present

## 2019-05-08 DIAGNOSIS — I2510 Atherosclerotic heart disease of native coronary artery without angina pectoris: Secondary | ICD-10-CM | POA: Diagnosis not present

## 2019-05-08 DIAGNOSIS — Z902 Acquired absence of lung [part of]: Secondary | ICD-10-CM

## 2019-05-08 DIAGNOSIS — K5669 Other partial intestinal obstruction: Secondary | ICD-10-CM | POA: Diagnosis not present

## 2019-05-08 DIAGNOSIS — Z85118 Personal history of other malignant neoplasm of bronchus and lung: Secondary | ICD-10-CM

## 2019-05-08 DIAGNOSIS — I4891 Unspecified atrial fibrillation: Secondary | ICD-10-CM | POA: Diagnosis present

## 2019-05-08 DIAGNOSIS — E1122 Type 2 diabetes mellitus with diabetic chronic kidney disease: Secondary | ICD-10-CM | POA: Diagnosis not present

## 2019-05-08 DIAGNOSIS — Z20828 Contact with and (suspected) exposure to other viral communicable diseases: Secondary | ICD-10-CM | POA: Diagnosis present

## 2019-05-08 DIAGNOSIS — Z7984 Long term (current) use of oral hypoglycemic drugs: Secondary | ICD-10-CM | POA: Diagnosis not present

## 2019-05-08 DIAGNOSIS — R140 Abdominal distension (gaseous): Secondary | ICD-10-CM | POA: Diagnosis not present

## 2019-05-08 DIAGNOSIS — R1084 Generalized abdominal pain: Secondary | ICD-10-CM | POA: Diagnosis not present

## 2019-05-08 DIAGNOSIS — K56609 Unspecified intestinal obstruction, unspecified as to partial versus complete obstruction: Secondary | ICD-10-CM

## 2019-05-08 DIAGNOSIS — I480 Paroxysmal atrial fibrillation: Secondary | ICD-10-CM | POA: Diagnosis present

## 2019-05-08 DIAGNOSIS — Z7901 Long term (current) use of anticoagulants: Secondary | ICD-10-CM

## 2019-05-08 DIAGNOSIS — Z87891 Personal history of nicotine dependence: Secondary | ICD-10-CM | POA: Diagnosis not present

## 2019-05-08 DIAGNOSIS — K219 Gastro-esophageal reflux disease without esophagitis: Secondary | ICD-10-CM | POA: Diagnosis not present

## 2019-05-08 DIAGNOSIS — I129 Hypertensive chronic kidney disease with stage 1 through stage 4 chronic kidney disease, or unspecified chronic kidney disease: Secondary | ICD-10-CM | POA: Diagnosis not present

## 2019-05-08 DIAGNOSIS — R0902 Hypoxemia: Secondary | ICD-10-CM | POA: Diagnosis not present

## 2019-05-08 DIAGNOSIS — Z0189 Encounter for other specified special examinations: Secondary | ICD-10-CM

## 2019-05-08 DIAGNOSIS — M109 Gout, unspecified: Secondary | ICD-10-CM | POA: Diagnosis not present

## 2019-05-08 DIAGNOSIS — N289 Disorder of kidney and ureter, unspecified: Secondary | ICD-10-CM | POA: Diagnosis not present

## 2019-05-08 DIAGNOSIS — I4821 Permanent atrial fibrillation: Secondary | ICD-10-CM | POA: Diagnosis present

## 2019-05-08 DIAGNOSIS — N183 Chronic kidney disease, stage 3 (moderate): Secondary | ICD-10-CM | POA: Diagnosis present

## 2019-05-08 DIAGNOSIS — Z79899 Other long term (current) drug therapy: Secondary | ICD-10-CM

## 2019-05-08 DIAGNOSIS — E785 Hyperlipidemia, unspecified: Secondary | ICD-10-CM | POA: Diagnosis not present

## 2019-05-08 DIAGNOSIS — Z03818 Encounter for observation for suspected exposure to other biological agents ruled out: Secondary | ICD-10-CM | POA: Diagnosis not present

## 2019-05-08 DIAGNOSIS — K565 Intestinal adhesions [bands], unspecified as to partial versus complete obstruction: Secondary | ICD-10-CM | POA: Diagnosis not present

## 2019-05-08 DIAGNOSIS — Z8719 Personal history of other diseases of the digestive system: Secondary | ICD-10-CM

## 2019-05-08 DIAGNOSIS — K5730 Diverticulosis of large intestine without perforation or abscess without bleeding: Secondary | ICD-10-CM | POA: Diagnosis not present

## 2019-05-08 DIAGNOSIS — K6389 Other specified diseases of intestine: Secondary | ICD-10-CM | POA: Diagnosis not present

## 2019-05-08 HISTORY — DX: Unspecified intestinal obstruction, unspecified as to partial versus complete obstruction: K56.609

## 2019-05-08 LAB — URINALYSIS, ROUTINE W REFLEX MICROSCOPIC
Bilirubin Urine: NEGATIVE
Glucose, UA: NEGATIVE mg/dL
Hgb urine dipstick: NEGATIVE
Ketones, ur: NEGATIVE mg/dL
Leukocytes,Ua: NEGATIVE
Nitrite: NEGATIVE
Protein, ur: 100 mg/dL — AB
Specific Gravity, Urine: 1.02 (ref 1.005–1.030)
pH: 6 (ref 5.0–8.0)

## 2019-05-08 LAB — CBC WITH DIFFERENTIAL/PLATELET
Abs Immature Granulocytes: 0.07 10*3/uL (ref 0.00–0.07)
Basophils Absolute: 0 10*3/uL (ref 0.0–0.1)
Basophils Relative: 0 %
Eosinophils Absolute: 0.1 10*3/uL (ref 0.0–0.5)
Eosinophils Relative: 1 %
HCT: 49.9 % (ref 39.0–52.0)
Hemoglobin: 16.5 g/dL (ref 13.0–17.0)
Immature Granulocytes: 1 %
Lymphocytes Relative: 6 %
Lymphs Abs: 0.6 10*3/uL — ABNORMAL LOW (ref 0.7–4.0)
MCH: 30 pg (ref 26.0–34.0)
MCHC: 33.1 g/dL (ref 30.0–36.0)
MCV: 90.7 fL (ref 80.0–100.0)
Monocytes Absolute: 0.6 10*3/uL (ref 0.1–1.0)
Monocytes Relative: 6 %
Neutro Abs: 8.7 10*3/uL — ABNORMAL HIGH (ref 1.7–7.7)
Neutrophils Relative %: 86 %
Platelets: 122 10*3/uL — ABNORMAL LOW (ref 150–400)
RBC: 5.5 MIL/uL (ref 4.22–5.81)
RDW: 14.8 % (ref 11.5–15.5)
WBC: 10.2 10*3/uL (ref 4.0–10.5)
nRBC: 0 % (ref 0.0–0.2)

## 2019-05-08 LAB — MAGNESIUM: Magnesium: 1.9 mg/dL (ref 1.7–2.4)

## 2019-05-08 LAB — COMPREHENSIVE METABOLIC PANEL
ALT: 20 U/L (ref 0–44)
AST: 20 U/L (ref 15–41)
Albumin: 4.4 g/dL (ref 3.5–5.0)
Alkaline Phosphatase: 109 U/L (ref 38–126)
Anion gap: 13 (ref 5–15)
BUN: 26 mg/dL — ABNORMAL HIGH (ref 8–23)
CO2: 29 mmol/L (ref 22–32)
Calcium: 9.9 mg/dL (ref 8.9–10.3)
Chloride: 95 mmol/L — ABNORMAL LOW (ref 98–111)
Creatinine, Ser: 1.3 mg/dL — ABNORMAL HIGH (ref 0.61–1.24)
GFR calc Af Amer: >60 mL/min (ref >60)
GFR calc non Af Amer: 54 mL/min — ABNORMAL LOW (ref >60)
Glucose, Bld: 184 mg/dL — ABNORMAL HIGH (ref 70–99)
Potassium: 3.8 mmol/L (ref 3.5–5.1)
Sodium: 137 mmol/L (ref 135–145)
Total Bilirubin: 1.4 mg/dL — ABNORMAL HIGH (ref 0.3–1.2)
Total Protein: 7.3 g/dL (ref 6.5–8.1)

## 2019-05-08 LAB — GLUCOSE, CAPILLARY: Glucose-Capillary: 131 mg/dL — ABNORMAL HIGH (ref 70–99)

## 2019-05-08 LAB — CBG MONITORING, ED
Glucose-Capillary: 168 mg/dL — ABNORMAL HIGH (ref 70–99)
Glucose-Capillary: 137 mg/dL — ABNORMAL HIGH (ref 70–99)
Glucose-Capillary: 144 mg/dL — ABNORMAL HIGH (ref 70–99)

## 2019-05-08 LAB — SARS CORONAVIRUS 2 (TAT 6-24 HRS): SARS Coronavirus 2: NEGATIVE

## 2019-05-08 LAB — LIPASE, BLOOD: Lipase: 26 U/L (ref 11–51)

## 2019-05-08 LAB — HEMOGLOBIN A1C
Hgb A1c MFr Bld: 7 % — ABNORMAL HIGH (ref 4.8–5.6)
Mean Plasma Glucose: 154.2 mg/dL

## 2019-05-08 MED ORDER — ONDANSETRON HCL 4 MG/2ML IJ SOLN: 4.0000 mg | Freq: Four times a day (QID) | INTRAMUSCULAR | Status: DC | PRN

## 2019-05-08 MED ORDER — ONDANSETRON HCL 4 MG/2ML IJ SOLN
4.0000 mg | Freq: Once | INTRAMUSCULAR | Status: AC
  Administered 2019-05-08: 4 mg via INTRAVENOUS
  Filled 2019-05-08: qty 2

## 2019-05-08 MED ORDER — ACETAMINOPHEN 650 MG RE SUPP: 650.0000 mg | Freq: Four times a day (QID) | RECTAL | Status: DC | PRN

## 2019-05-08 MED ORDER — SODIUM CHLORIDE 0.9 % IV SOLN
INTRAVENOUS | Status: AC
  Administered 2019-05-08: 06:00:00 via INTRAVENOUS

## 2019-05-08 MED ORDER — MORPHINE SULFATE (PF) 4 MG/ML IV SOLN
4.0000 mg | Freq: Once | INTRAVENOUS | Status: AC
  Administered 2019-05-08: 4 mg via INTRAVENOUS
  Filled 2019-05-08: qty 1

## 2019-05-08 MED ORDER — MORPHINE SULFATE (PF) 2 MG/ML IV SOLN
1.0000 mg | INTRAVENOUS | Status: DC | PRN
  Administered 2019-05-08 – 2019-05-11 (×17): 1 mg via INTRAVENOUS
  Filled 2019-05-08 (×17): qty 1

## 2019-05-08 MED ORDER — SODIUM CHLORIDE (PF) 0.9 % IJ SOLN
INTRAMUSCULAR | Status: AC
  Filled 2019-05-08: qty 50

## 2019-05-08 MED ORDER — IPRATROPIUM-ALBUTEROL 0.5-2.5 (3) MG/3ML IN SOLN
3.0000 mL | Freq: Four times a day (QID) | RESPIRATORY_TRACT | Status: DC | PRN
  Administered 2019-05-10 – 2019-05-11 (×2): 3 mL via RESPIRATORY_TRACT
  Filled 2019-05-08 (×3): qty 3

## 2019-05-08 MED ORDER — ACETAMINOPHEN 325 MG PO TABS: 650.0000 mg | ORAL_TABLET | Freq: Four times a day (QID) | ORAL | Status: DC | PRN

## 2019-05-08 MED ORDER — INSULIN ASPART 100 UNIT/ML ~~LOC~~ SOLN
0.0000 [IU] | SUBCUTANEOUS | Status: DC
  Administered 2019-05-08: 2 [IU] via SUBCUTANEOUS
  Administered 2019-05-08 – 2019-05-11 (×5): 1 [IU] via SUBCUTANEOUS
  Filled 2019-05-08: qty 0.09

## 2019-05-08 MED ORDER — IOHEXOL 300 MG/ML  SOLN
100.0000 mL | Freq: Once | INTRAMUSCULAR | Status: AC | PRN
  Administered 2019-05-08: 100 mL via INTRAVENOUS

## 2019-05-08 MED ORDER — DIATRIZOATE MEGLUMINE & SODIUM 66-10 % PO SOLN
90.0000 mL | Freq: Once | ORAL | Status: DC
  Filled 2019-05-08: qty 90

## 2019-05-08 NOTE — ED Provider Notes (Signed)
[facility] DEPT Provider Note   CSN: [id number] Arrival date & time: 05/08/19  0053    History   Chief Complaint Chief Complaint  Patient presents with  . Abdominal Pain    HPI Juan Valdez is a 73 y.o. male.   The history is provided by the patient.  Abdominal Pain He has history of hypertension, diabetes, hyperlipidemia, coronary artery disease, atrial fibrillation anticoagulated on apixaban, GERD, lung cancer and comes in because of abdominal pain and distention since about noon.  Pain is severe and he rates it at 9/10.  Nothing makes it better, nothing makes it worse.  There is associated nausea without vomiting.  He did have a small bowel movement and passed a small amount of flatus without any effect on his pain.  He does have history of bowel obstruction in the past.  Past Medical History:  Diagnosis Date  . Anemia   . Atrial fibrillation ([place]) 09/07/2018  . CAD (coronary artery disease)   . Colon polyp   . COPD (chronic obstructive pulmonary disease) ([place])   . Diabetes ([place])   . Diverticulosis   . GERD (gastroesophageal reflux disease)   . Gout   . HLD (hyperlipidemia)   . Lung cancer ([place])   . Perforated bowel [facility])     Patient Active Problem List   Diagnosis Date Noted  . Generalized abdominal pain 05/06/2019  . Abdominal bloating 05/06/2019  . Abdominal distension 05/06/2019  . Irritable bowel syndrome with constipation 05/06/2019  . Hx of colonic polyps 05/06/2019  . 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 Bilateral recurrent inguinal hernia without obstruction or gangrene 05/06/2019  . Former smoker 03/14/2019  . Orthopnea 01/04/2019  . Medication management 01/04/2019  . Non-small cell cancer of right lung ([place]) 11/23/2018  . COPD (chronic obstructive pulmonary disease) ([place]) 11/23/2018  . Onychomycosis due to dermatophyte 11/16/2018  . Atrial fibrillation  ([place]) 09/07/2018  . CAD (coronary artery disease) 09/07/2018  . Essential hypertension 09/07/2018  . Mixed dyslipidemia 09/07/2018  . Diabetes mellitus due to underlying condition with unspecified complications ([place]) [id number]    Past Surgical History:  Procedure Laterality Date  . ABDOMINAL SURGERY    . CORONARY ANGIOPLASTY WITH STENT PLACEMENT  2003  . LUNG CANCER SURGERY    . ROTATOR CUFF REPAIR Left   . TONSILLECTOMY     age 41        Home Medications    Prior to Admission medications   Medication Sig Start Date End Date Taking? Authorizing Provider  albuterol (PROVENTIL) (2.5 MG/3ML) 0.083% nebulizer solution Take 3 mLs (2.5 mg total) by nebulization every 6 (six) hours as needed for up to 4 doses for wheezing or shortness of breath. 03/14/19   Lauraine Rinne, NP  albuterol (VENTOLIN HFA) 108 (90 Base) MCG/ACT inhaler Use 2 puffs every 4-6 hours as needed for wheezing or shortness of breath 04/04/19   Lauraine Rinne, NP  allopurinol (ZYLOPRIM) 300 MG tablet Take 300 mg by mouth daily.    [provider]  apixaban (ELIQUIS) 5 MG TABS tablet Take 1 tablet (5 mg total) by mouth 2 (two) times daily. 01/12/19   Revankar, Reita Cliche, MD  atorvastatin (LIPITOR) 40 MG tablet Take 40 mg by mouth daily.    [provider]  dicyclomine (BENTYL) 10 MG capsule Take 1 capsule by mouth every 6 (six) hours as needed. 04/17/19   [provider]  Fluticasone-Umeclidin-Vilant (  TRELEGY ELLIPTA) 100-62.5-25 MCG/INH AEPB Inhale 1 puff into the lungs daily. 04/04/19   Lauraine Rinne, NP  furosemide (LASIX) 20 MG tablet Take 20 mg (1 Tab) every other day 01/25/19   Revankar, Reita Cliche, MD  ipratropium (ATROVENT) 0.03 % nasal spray Place 2 sprays into both nostrils every 12 (twelve) hours as needed for rhinitis.     [provider]  metFORMIN (GLUMETZA) 500 MG (MOD) 24 hr tablet Take 500 mg by mouth 2 (two) times daily.    [provider]  metoprolol succinate (TOPROL-XL) 25  MG 24 hr tablet Take 25 mg by mouth daily. 03/30/19   [provider]  nitroGLYCERIN (NITROSTAT) 0.4 MG SL tablet Place 0.4 mg under the tongue every 5 (five) minutes as needed for chest pain.    [provider]  omeprazole (PRILOSEC) 40 MG capsule Take 40 mg by mouth 2 (two) times daily.    [provider]  tamsulosin (FLOMAX) 0.4 MG CAPS capsule Take 0.4 mg by mouth daily after supper.     [provider]  traMADol (ULTRAM) 50 MG tablet Take 50 mg by mouth every 6 (six) hours as needed. 02/07/19   [provider]  traZODone (DESYREL) 100 MG tablet Take 100 mg by mouth at bedtime.    [provider]    Family History Family History  Adopted: Yes    Social History Social History   Tobacco Use  . Smoking status: Former Smoker    Packs/day: 3.00    Years: 57.00    Pack years: 171.00    Types: Cigarettes    Start date: 54    Quit date: 06/13/2014    Years since quitting: 4.9  . Smokeless tobacco: Never Used  Substance Use Topics  . Alcohol use: Not on file  . Drug use: Not on file     Allergies   Patient has no known allergies.   Review of Systems Review of Systems  Gastrointestinal: Positive for abdominal pain.  All other systems reviewed and are negative.    Physical Exam Updated Vital Signs BP 138/83   Pulse 78   Resp 16   SpO2 95%   Physical Exam Vitals signs and nursing note reviewed.  Skin:    Coloration: Pallor:     73 year old male, resting comfortably and in no acute distress. Vital signs are normal. Oxygen saturation is 95%, which is normal. Head is normocephalic and atraumatic. PERRLA, EOMI. Oropharynx is clear. Neck is nontender and supple without adenopathy or JVD. Back is nontender and there is no CVA tenderness. Lungs are clear without rales, wheezes, or rhonchi. Chest is nontender. Heart has regular rate and rhythm without murmur. Abdomen is soft, moderately distended with small umbilical  hernia present.  There is moderate periumbilical tenderness without rebound or guarding.  There are no masses or hepatosplenomegaly and peristalsis is hypoactive. Extremities have no cyanosis or edema, full range of motion is present. Skin is warm and dry without rash. Neurologic: Mental status is normal, cranial nerves are intact, there are no motor or sensory deficits.  ED Treatments / Results  Labs (all labs ordered are listed, but only abnormal results are displayed) Labs Reviewed  COMPREHENSIVE METABOLIC PANEL - Abnormal; Notable for the following components:      Result Value   Chloride 95 (*)    Glucose, Bld 184 (*)    BUN 26 (*)    Creatinine, Ser 1.30 (*)    Total Bilirubin 1.4 (*)  GFR calc non Af Amer 54 (*)    All other components within normal limits  CBC WITH DIFFERENTIAL/PLATELET - Abnormal; Notable for the following components:   Platelets 122 (*)    Neutro Abs 8.7 (*)    Lymphs Abs 0.6 (*)    All other components within normal limits  URINALYSIS, ROUTINE W REFLEX MICROSCOPIC - Abnormal; Notable for the following components:   APPearance HAZY (*)    Protein, ur 100 (*)    Bacteria, UA RARE (*)    All other components within normal limits  SARS CORONAVIRUS 2 (TAT 6-12 HRS)  LIPASE, BLOOD   Radiology Ct Abdomen Pelvis W Contrast  Result Date: 05/08/2019 CLINICAL DATA:  73 year old male with abdominal pain and tenderness. Difficulty with bowel movement. EXAM: CT ABDOMEN AND PELVIS WITH CONTRAST TECHNIQUE: Multidetector CT imaging of the abdomen and pelvis was performed using the standard protocol following bolus administration of intravenous contrast. CONTRAST:  190mL OMNIPAQUE IOHEXOL 300 MG/ML  SOLN COMPARISON:  None. FINDINGS: Lower chest: The visualized lung bases are clear. Multi vessel coronary vascular calcification as well as calcification of the mitral annulus. No intra-abdominal free air. There is trace free fluid within the pelvis. Hepatobiliary: The liver  is unremarkable. No intrahepatic biliary ductal dilatation. Apparent mild thickening of the gallbladder wall which may be related to chronic cholecystitis. No calcified stone or pericholecystic fluid. Ultrasound may provide better evaluation if clinically indicated. Pancreas: Unremarkable. No pancreatic ductal dilatation or surrounding inflammatory changes. Spleen: Normal in size without focal abnormality. Adrenals/Urinary Tract: The adrenal glands are unremarkable. There is no hydronephrosis on either side. Bilateral renal cysts measure up to 3 cm in the lower pole of the right kidney. Additional smaller hypodensities are too small to characterize. There is symmetric enhancement and excretion of contrast by both kidneys. The visualized ureters and urinary bladder are unremarkable. Stomach/Bowel: Postsurgical changes of partial sigmoid resection with anastomotic suture. There is extensive sigmoid and diffuse colonic diverticulosis without active inflammatory changes. Diffusely dilated loops of small bowel throughout the abdomen some of which are fecalized measuring up to 3.6 cm in caliber. There is a transition in the right lower quadrant (series 5, image 66 and coronal series 2 image 67). There is slight swirling of the mesentery at this level which is likely postsurgical changes. Findings most consistent with obstruction likely secondary to adhesion although an internal hernia is not entirely excluded. There is no pneumatosis. There is a 6.5 cm duodenal diverticulum. Vascular/Lymphatic: Advanced aortoiliac atherosclerotic disease. There is a 2.5 cm infrarenal aortic ectasia. The origins of the celiac axis, SMA, IMA are patent. No portal venous gas. There is no adenopathy. Reproductive: Enlarged prostate gland measuring 6.5 cm in transverse axial diameter. Other: None Musculoskeletal: Degenerative changes of the spine. No acute osseous pathology. IMPRESSION: 1. Small-bowel obstruction with transition zone in the  right lower quadrant likely secondary to adhesion although an internal hernia is not entirely excluded. No pneumatosis or portal venous gas. 2. Extensive colonic diverticulosis. 3. Aortic Atherosclerosis (ICD10-I70.0). Electronically Signed   By: Anner Crete M.D.   On: 05/08/2019 03:38    Procedures Procedures   Medications Ordered in ED Medications  sodium chloride (PF) 0.9 % injection (has no administration in time range)  morphine 4 MG/ML injection 4 mg (4 mg Intravenous Given 05/08/19 0132)  ondansetron (ZOFRAN) injection 4 mg (4 mg Intravenous Given 05/08/19 0132)  iohexol (OMNIPAQUE) 300 MG/ML solution 100 mL (100 mLs Intravenous Contrast Given 05/08/19 0310)  Initial Impression / Assessment and Plan / ED Course  I have reviewed the triage vital signs and the nursing notes.  Pertinent labs & imaging results that were available during my care of the patient were reviewed by me and considered in my medical decision making (see chart for details).  Abdominal pain and distention worrisome for small bowel obstruction.  Old records are reviewed and he had an initial evaluation by gastroenterology 4 days ago, evidently has chronic problems with constipation and some baseline abdominal distention.  Will check screening labs and sent for CT of abdomen and pelvis.  Labs show borderline renal insufficiency, but not significantly changed from baseline.  CT scan shows evidence of partial small bowel obstruction with transition point in the right lower quadrant.  Case is discussed with Dr. Marlowe Sax of [facility], who agrees to admit the patient.  Final Clinical Impressions(s) / ED Diagnoses   Final diagnoses:  SBO (small bowel obstruction) [facility])  Renal insufficiency    ED Discharge Orders    None       Delora Fuel, MD 75/79/72 [phone]

## 2019-05-08 NOTE — H&P (Signed)
History and Physical    AMIN FORNWALT [name] DOB: 1946/02/24 DOA: 05/08/2019  PCP: Ronita Hipps, MD Patient coming from: Home  Chief Complaint: Abdominal pain, distention  HPI: Juan Valdez is a 73 y.o. male with medical history significant of CAD, A. fib on anticoagulation, COPD, CAD status post PCI, type 2 diabetes, hyperlipidemia, lung cancer, diverticulosis, colon polyps, duodenal polyps, IBS-C, hypertension, hyperlipidemia, previous iron deficiency anemia, previous acute enteritis with partial SBO, recently seen by outpatient GI on 8/20 for evaluation of generalized abdominal pain/bloating/distention presenting to the hospital for evaluation of abdominal pain and distention.  Patient reports 1 day history of severe periumbilical abdominal pain and abdominal distention.  States he was doing well previously and was not having any abdominal problems.  Denies any nausea or vomiting.  States he had a small bowel movement yesterday morning but not since then.  He was able to tolerate all his meals including breakfast, lunch, and dinner yesterday.  ED Course: Afebrile and hemodynamically stable.  No leukocytosis.  Lipase normal.  T bili mildly elevated at 1.4, remainder of LFTs normal.  UA not suggestive of infection.  COVID-19 test pending.  CT abdomen pelvis showing small bowel obstruction with transition zone in the right lower quadrant likely secondary to adhesion although an internal hernia is not entirely excluded.  No pneumatosis or portal venous gas.  Review of Systems:  All systems reviewed and apart from history of presenting illness, are negative.  Past Medical History:  Diagnosis Date  . Anemia   . Atrial fibrillation ([place]) 09/07/2018  . CAD (coronary artery disease)   . Colon polyp   . COPD (chronic obstructive pulmonary disease) ([place])   . Diabetes ([place])   . Diverticulosis   . GERD (gastroesophageal reflux disease)   . Gout   . HLD (hyperlipidemia)   . Lung  cancer ([place])   . Perforated bowel [facility])     Past Surgical History:  Procedure Laterality Date  . ABDOMINAL SURGERY    . CORONARY ANGIOPLASTY WITH STENT PLACEMENT  2003  . LUNG CANCER SURGERY    . ROTATOR CUFF REPAIR Left   . TONSILLECTOMY     age 81     reports that he quit smoking about 4 years ago. His smoking use included cigarettes. He started smoking about 62 years ago. He has a 171.00 pack-year smoking history. He has never used smokeless tobacco. No history on file for alcohol and drug.  No Known Allergies  Family History  Adopted: Yes    Prior to Admission medications   Medication Sig Start Date End Date Taking? Authorizing Provider  albuterol (PROVENTIL) (2.5 MG/3ML) 0.083% nebulizer solution Take 3 mLs (2.5 mg total) by nebulization every 6 (six) hours as needed for up to 4 doses for wheezing or shortness of breath. 03/14/19   Lauraine Rinne, NP  albuterol (VENTOLIN HFA) 108 (90 Base) MCG/ACT inhaler Use 2 puffs every 4-6 hours as needed for wheezing or shortness of breath 04/04/19   Lauraine Rinne, NP  allopurinol (ZYLOPRIM) 300 MG tablet Take 300 mg by mouth daily.    [provider]  apixaban (ELIQUIS) 5 MG TABS tablet Take 1 tablet (5 mg total) by mouth 2 (two) times daily. 01/12/19   Revankar, Reita Cliche, MD  atorvastatin (LIPITOR) 40 MG tablet Take 40 mg by mouth daily.    [provider]  dicyclomine (BENTYL) 10 MG capsule Take 1 capsule by mouth every 6 (six) hours as needed. 04/17/19  [provider]  Fluticasone-Umeclidin-Vilant (TRELEGY ELLIPTA) 100-62.5-25 MCG/INH AEPB Inhale 1 puff into the lungs daily. 04/04/19   Lauraine Rinne, NP  furosemide (LASIX) 20 MG tablet Take 20 mg (1 Tab) every other day 01/25/19   Revankar, Reita Cliche, MD  ipratropium (ATROVENT) 0.03 % nasal spray Place 2 sprays into both nostrils every 12 (twelve) hours as needed for rhinitis.     [provider]  metFORMIN (GLUMETZA) 500 MG (MOD) 24 hr tablet Take 500 mg by  mouth 2 (two) times daily.    [provider]  metoprolol succinate (TOPROL-XL) 25 MG 24 hr tablet Take 25 mg by mouth daily. 03/30/19   [provider]  nitroGLYCERIN (NITROSTAT) 0.4 MG SL tablet Place 0.4 mg under the tongue every 5 (five) minutes as needed for chest pain.    [provider]  omeprazole (PRILOSEC) 40 MG capsule Take 40 mg by mouth 2 (two) times daily.    [provider]  tamsulosin (FLOMAX) 0.4 MG CAPS capsule Take 0.4 mg by mouth daily after supper.     [provider]  traMADol (ULTRAM) 50 MG tablet Take 50 mg by mouth every 6 (six) hours as needed. 02/07/19   [provider]  traZODone (DESYREL) 100 MG tablet Take 100 mg by mouth at bedtime.    [provider]    Physical Exam: Vitals:   05/08/19 0135 05/08/19 0330 05/08/19 0400 05/08/19 0430  BP:  (!) 157/83 (!) 149/78 140/78  Pulse:  69 67 74  Resp:  15 16 17   Temp: 98 F (36.7 C)     TempSrc: Oral     SpO2:  93% 91% 90%    Physical Exam  Constitutional: He is oriented to person, place, and time. He appears well-developed and well-nourished. No distress.  Sitting up at the side of the bed watching television  HENT:  Head: Normocephalic.  Mouth/Throat: Oropharynx is clear and moist.  Eyes: Right eye exhibits no discharge. Left eye exhibits no discharge.  Neck: Neck supple.  Cardiovascular: Normal rate, regular rhythm and intact distal pulses.  Pulmonary/Chest: Effort normal and breath sounds normal. No respiratory distress. He has no wheezes. He has no rales.  Abdominal: He exhibits distension. There is no rebound and no guarding.  Hypoactive bowel sounds Tenderness in the periumbilical region  Musculoskeletal:        General: No edema.  Neurological: He is alert and oriented to person, place, and time.  Skin: Skin is warm and dry. He is not diaphoretic.     Labs on Admission: I have personally reviewed following labs and imaging  studies  CBC: Recent Labs  Lab 05/08/19 0124  WBC 10.2  NEUTROABS 8.7*  HGB 16.5  HCT 49.9  MCV 90.7  PLT 161*   Basic Metabolic Panel: Recent Labs  Lab 05/08/19 0124  NA 137  K 3.8  CL 95*  CO2 29  GLUCOSE 184*  BUN 26*  CREATININE 1.30*  CALCIUM 9.9   GFR: Estimated Creatinine Clearance: 53.3 mL/min (A) (by C-G formula based on SCr of 1.3 mg/dL (H)). Liver Function Tests: Recent Labs  Lab 05/08/19 0124  AST 20  ALT 20  ALKPHOS 109  BILITOT 1.4*  PROT 7.3  ALBUMIN 4.4   Recent Labs  Lab 05/08/19 0124  LIPASE 26   No results for input(s): AMMONIA in the last 168 hours. Coagulation Profile: No results for input(s): INR, PROTIME in the last 168 hours. Cardiac Enzymes: No results for  input(s): CKTOTAL, CKMB, CKMBINDEX, TROPONINI in the last 168 hours. BNP (last 3 results) No results for input(s): PROBNP in the last 8760 hours. HbA1C: No results for input(s): HGBA1C in the last 72 hours. CBG: No results for input(s): GLUCAP in the last 168 hours. Lipid Profile: No results for input(s): CHOL, HDL, LDLCALC, TRIG, CHOLHDL, LDLDIRECT in the last 72 hours. Thyroid Function Tests: No results for input(s): TSH, T4TOTAL, FREET4, T3FREE, THYROIDAB in the last 72 hours. Anemia Panel: No results for input(s): VITAMINB12, FOLATE, FERRITIN, TIBC, IRON, RETICCTPCT in the last 72 hours. Urine analysis:    Component Value Date/Time   COLORURINE YELLOW 05/08/2019 0124   APPEARANCEUR HAZY (A) 05/08/2019 0124   LABSPEC 1.020 05/08/2019 0124   PHURINE 6.0 05/08/2019 0124   GLUCOSEU NEGATIVE 05/08/2019 0124   HGBUR NEGATIVE 05/08/2019 0124   BILIRUBINUR NEGATIVE 05/08/2019 0124   KETONESUR NEGATIVE 05/08/2019 0124   PROTEINUR 100 (A) 05/08/2019 0124   NITRITE NEGATIVE 05/08/2019 0124   LEUKOCYTESUR NEGATIVE 05/08/2019 0124    Radiological Exams on Admission: Ct Abdomen Pelvis W Contrast  Result Date: 05/08/2019 CLINICAL DATA:  73 year old male with abdominal  pain and tenderness. Difficulty with bowel movement. EXAM: CT ABDOMEN AND PELVIS WITH CONTRAST TECHNIQUE: Multidetector CT imaging of the abdomen and pelvis was performed using the standard protocol following bolus administration of intravenous contrast. CONTRAST:  185mL OMNIPAQUE IOHEXOL 300 MG/ML  SOLN COMPARISON:  None. FINDINGS: Lower chest: The visualized lung bases are clear. Multi vessel coronary vascular calcification as well as calcification of the mitral annulus. No intra-abdominal free air. There is trace free fluid within the pelvis. Hepatobiliary: The liver is unremarkable. No intrahepatic biliary ductal dilatation. Apparent mild thickening of the gallbladder wall which may be related to chronic cholecystitis. No calcified stone or pericholecystic fluid. Ultrasound may provide better evaluation if clinically indicated. Pancreas: Unremarkable. No pancreatic ductal dilatation or surrounding inflammatory changes. Spleen: Normal in size without focal abnormality. Adrenals/Urinary Tract: The adrenal glands are unremarkable. There is no hydronephrosis on either side. Bilateral renal cysts measure up to 3 cm in the lower pole of the right kidney. Additional smaller hypodensities are too small to characterize. There is symmetric enhancement and excretion of contrast by both kidneys. The visualized ureters and urinary bladder are unremarkable. Stomach/Bowel: Postsurgical changes of partial sigmoid resection with anastomotic suture. There is extensive sigmoid and diffuse colonic diverticulosis without active inflammatory changes. Diffusely dilated loops of small bowel throughout the abdomen some of which are fecalized measuring up to 3.6 cm in caliber. There is a transition in the right lower quadrant (series 5, image 66 and coronal series 2 image 67). There is slight swirling of the mesentery at this level which is likely postsurgical changes. Findings most consistent with obstruction likely secondary to  adhesion although an internal hernia is not entirely excluded. There is no pneumatosis. There is a 6.5 cm duodenal diverticulum. Vascular/Lymphatic: Advanced aortoiliac atherosclerotic disease. There is a 2.5 cm infrarenal aortic ectasia. The origins of the celiac axis, SMA, IMA are patent. No portal venous gas. There is no adenopathy. Reproductive: Enlarged prostate gland measuring 6.5 cm in transverse axial diameter. Other: None Musculoskeletal: Degenerative changes of the spine. No acute osseous pathology. IMPRESSION: 1. Small-bowel obstruction with transition zone in the right lower quadrant likely secondary to adhesion although an internal hernia is not entirely excluded. No pneumatosis or portal venous gas. 2. Extensive colonic diverticulosis. 3. Aortic Atherosclerosis (ICD10-I70.0). Electronically Signed   By: Anner Crete M.D.   On:  05/08/2019 03:38    Assessment/Plan Principal Problem:   SBO (small bowel obstruction) (HCC) Active Problems:   Atrial fibrillation (HCC)   Diabetes mellitus due to underlying condition with unspecified complications (HCC)   COPD (chronic obstructive pulmonary disease) ([place])   Mechanical SBO Presenting with complaints of abdominal pain and distention.  Had a small bowel movement yesterday morning.  No nausea/vomiting and was tolerating p.o. intake yesterday. CT abdomen pelvis showing small bowel obstruction with transition zone in the right lower quadrant likely secondary to adhesion although an internal hernia is not entirely excluded.  No pneumatosis. -Keep n.p.o., bowel rest -IV fluid hydration -IV Zofran PRN -IV morphine PRN -Consult general surgery in a.m.  Non-insulin-dependent diabetes mellitus -Check A1c.  Sliding scale insulin sensitive every 4 hours and CBG checks.  Atrial fibrillation -Currently rate controlled.  Hold home Eliquis at this time.  Evaluation by general surgery for SBO pending.  COPD -Stable.  No wheezing or shortness of  breath.  DuoNebs PRN.  Pharmacy med rec pending.  DVT prophylaxis: SCDs Code Status: Patient wishes to be full code. Family Communication: No family available at this time. Disposition Plan: Anticipate discharge after clinical improvement. Consults called: None Admission status: It is my clinical opinion that admission to INPATIENT is reasonable and necessary in this 73 y.o. male presenting with symptoms of abdominal pain and distention, concerning for mechanical small bowel obstruction.  Needs bowel rest and surgical evaluation in the morning.  Given the aforementioned, the predictability of an adverse outcome is felt to be significant. I expect that the patient will require at least 2 midnights in the hospital to treat this condition.   The medical decision making on this patient was of high complexity and the patient is at high risk for clinical deterioration, therefore this is a level 3 visit.  Shela Leff MD [facility] Pager [phone]  If 7PM-7AM, please contact night-coverage [email or web address] Password [email or web address]  05/08/2019, 5:09 AM

## 2019-05-08 NOTE — ED Notes (Signed)
Pt aware urine sample is needed 

## 2019-05-08 NOTE — Progress Notes (Deleted)
2nd shift ED CSW received a handoff from the 1st shift [facility] ED CSW.   Per the 1st shift ED CSW received a call from the group home stating they will pick up the pt at 6pm.  CSW asked the 2nd shift to updated the EDP/RN.  CSW will continue to follow for D/C needs.  Alphonse Guild. Berl Bonfanti, LCSW, LCAS, CSI Transitions of Care Clinical Social Worker Care Coordination Department Ph: [phone]

## 2019-05-08 NOTE — ED Notes (Signed)
ED TO INPATIENT HANDOFF REPORT  Name/Age/Gender Juan Valdez 73 y.o. male  Code Status    Code Status Orders  (From admission, onward)         Start     Ordered   05/08/19 0508  Full code  Continuous     05/08/19 0508        Code Status History    This patient has a current code status but no historical code status.   Advance Care Planning Activity      Home/SNF/Other Home  Chief Complaint Abdominal Pain  Level of Care/Admitting Diagnosis ED Disposition    ED Disposition Condition [facility] Area: [place] [[id number]]  Level of Care: Med-Surg [16]  Covid Evaluation: Asymptomatic Screening Protocol (No Symptoms)  Diagnosis: SBO (small bowel obstruction) [facility]) [[id number]]  Admitting Physician: Shela Leff [[id number]]  Attending Physician: Shela Leff [[id number]]  Estimated length of stay: past midnight tomorrow  Certification:: I certify this patient will need inpatient services for at least 2 midnights  PT Class (Do Not Modify): Inpatient [101]  PT Acc Code (Do Not Modify): Private [1]       Medical History Past Medical History:  Diagnosis Date  . Anemia   . Atrial fibrillation ([place]) 09/07/2018  . CAD (coronary artery disease)   . Colon polyp   . COPD (chronic obstructive pulmonary disease) ([place])   . Diabetes ([place])   . Diverticulosis   . GERD (gastroesophageal reflux disease)   . Gout   . HLD (hyperlipidemia)   . Lung cancer ([place])   . Perforated bowel ([place])     Allergies No Known Allergies  IV Location/Drains/Wounds Patient Lines/Drains/Airways Status   Active Line/Drains/Airways    Name:   Placement date:   Placement time:   Site:   Days:   Peripheral IV 05/08/19 Left Antecubital   05/08/19    0119    Antecubital   less than 1          Labs/Imaging Results for orders placed or performed during the hospital encounter of 05/08/19 (from the past 48 hour(s))  Comprehensive metabolic panel      Status: Abnormal   Collection Time: 05/08/19  1:24 AM  Result Value Ref Range   Sodium 137 135 - 145 mmol/L   Potassium 3.8 3.5 - 5.1 mmol/L   Chloride 95 (L) 98 - 111 mmol/L   CO2 29 22 - 32 mmol/L   Glucose, Bld 184 (H) 70 - 99 mg/dL   BUN 26 (H) 8 - 23 mg/dL   Creatinine, Ser 1.30 (H) 0.61 - 1.24 mg/dL   Calcium 9.9 8.9 - 10.3 mg/dL   Total Protein 7.3 6.5 - 8.1 g/dL   Albumin 4.4 3.5 - 5.0 g/dL   AST 20 15 - 41 U/L   ALT 20 0 - 44 U/L   Alkaline Phosphatase 109 38 - 126 U/L   Total Bilirubin 1.4 (H) 0.3 - 1.2 mg/dL   GFR calc non Af Amer 54 (L) >60 mL/min   GFR calc Af Amer >60 >60 mL/min   Anion gap 13 5 - 15    Comment: Performed at [facility], [place] [address], [place], [place] [address]  Lipase, blood     Status: None   Collection Time: 05/08/19  1:24 AM  Result Value Ref Range   Lipase 26 11 - 51 U/L    Comment: Performed at [facility], [place] [place],  [place] [address]  CBC with Differential     Status: Abnormal   Collection Time: 05/08/19  1:24 AM  Result Value Ref Range   WBC 10.2 4.0 - 10.5 K/uL   RBC 5.50 4.22 - 5.81 MIL/uL   Hemoglobin 16.5 13.0 - 17.0 g/dL   HCT 49.9 39.0 - 52.0 %   MCV 90.7 80.0 - 100.0 fL   MCH 30.0 26.0 - 34.0 pg   MCHC 33.1 30.0 - 36.0 g/dL   RDW 14.8 11.5 - 15.5 %   Platelets 122 (L) 150 - 400 K/uL   nRBC 0.0 0.0 - 0.2 %   Neutrophils Relative % 86 %   Neutro Abs 8.7 (H) 1.7 - 7.7 K/uL   Lymphocytes Relative 6 %   Lymphs Abs 0.6 (L) 0.7 - 4.0 K/uL   Monocytes Relative 6 %   Monocytes Absolute 0.6 0.1 - 1.0 K/uL   Eosinophils Relative 1 %   Eosinophils Absolute 0.1 0.0 - 0.5 K/uL   Basophils Relative 0 %   Basophils Absolute 0.0 0.0 - 0.1 K/uL   Immature Granulocytes 1 %   Abs Immature Granulocytes 0.07 0.00 - 0.07 K/uL    Comment: Performed at [facility], [place] [address] [place], [place] [address]  Urinalysis, Routine w reflex microscopic     Status: Abnormal    Collection Time: 05/08/19  1:24 AM  Result Value Ref Range   Color, Urine YELLOW YELLOW   APPearance HAZY (A) CLEAR   Specific Gravity, Urine 1.020 1.005 - 1.030   pH 6.0 5.0 - 8.0   Glucose, UA NEGATIVE NEGATIVE mg/dL   Hgb urine dipstick NEGATIVE NEGATIVE   Bilirubin Urine NEGATIVE NEGATIVE   Ketones, ur NEGATIVE NEGATIVE mg/dL   Protein, ur 100 (A) NEGATIVE mg/dL   Nitrite NEGATIVE NEGATIVE   Leukocytes,Ua NEGATIVE NEGATIVE   RBC / HPF 0-5 0 - 5 RBC/hpf   WBC, UA 0-5 0 - 5 WBC/hpf   Bacteria, UA RARE (A) NONE SEEN   Squamous Epithelial / LPF 0-5 0 - 5   Mucus PRESENT     Comment: Performed at [facility], [place] [address], [place], [place] [address]  Magnesium     Status: None   Collection Time: 05/08/19  1:24 AM  Result Value Ref Range   Magnesium 1.9 1.7 - 2.4 mg/dL    Comment: Performed at [facility], [place] [address] [place], [place] [address]  SARS CORONAVIRUS 2 (TAT 6-12 HRS) Nasal Swab Aptima Multi Swab     Status: None   Collection Time: 05/08/19  5:08 AM   Specimen: Aptima Multi Swab; Nasal Swab  Result Value Ref Range   SARS Coronavirus 2 NEGATIVE NEGATIVE    Comment: (NOTE) SARS-CoV-2 target nucleic acids are NOT DETECTED. The SARS-CoV-2 RNA is generally detectable in upper and lower respiratory specimens during the acute phase of infection. Negative results do not preclude SARS-CoV-2 infection, do not rule out co-infections with other pathogens, and should not be used as the sole basis for treatment or other patient management decisions. Negative results must be combined with clinical observations, patient history, and epidemiological information. The expected result is Negative. Fact Sheet for Patients: SugarRoll.be Fact Sheet for Healthcare Providers: https://www.woods-mathews.com/ This test is not yet approved or cleared by the [place] FDA and  has been authorized for  detection and/or diagnosis of SARS-CoV-2 by FDA under an Emergency Use Authorization (EUA). This EUA will remain  in effect (meaning this test  can be used) for the duration of the COVID-19 declaration under Section 56 4(b)(1) of the Act, 21 U.S.C. section 360bbb-3(b)(1), unless the authorization is terminated or revoked sooner. Performed at [facility], [place] [address], [place], [place] [address]   Hemoglobin A1c     Status: Abnormal   Collection Time: 05/08/19  5:27 AM  Result Value Ref Range   Hgb A1c MFr Bld 7.0 (H) 4.8 - 5.6 %    Comment: (NOTE) Pre diabetes:          5.7%-6.4% Diabetes:              >6.4% Glycemic control for   <7.0% adults with diabetes    Mean Plasma Glucose 154.2 mg/dL    Comment: Performed at [place] [address] [place], [place] [address]  CBG monitoring, ED     Status: Abnormal   Collection Time: 05/08/19  5:30 AM  Result Value Ref Range   Glucose-Capillary 168 (H) 70 - 99 mg/dL  CBG monitoring, ED     Status: Abnormal   Collection Time: 05/08/19  1:55 PM  Result Value Ref Range   Glucose-Capillary 137 (H) 70 - 99 mg/dL  CBG monitoring, ED     Status: Abnormal   Collection Time: 05/08/19  3:42 PM  Result Value Ref Range   Glucose-Capillary 144 (H) 70 - 99 mg/dL   Ct Abdomen Pelvis W Contrast  Result Date: 05/08/2019 CLINICAL DATA:  73 year old male with abdominal pain and tenderness. Difficulty with bowel movement. EXAM: CT ABDOMEN AND PELVIS WITH CONTRAST TECHNIQUE: Multidetector CT imaging of the abdomen and pelvis was performed using the standard protocol following bolus administration of intravenous contrast. CONTRAST:  138mL OMNIPAQUE IOHEXOL 300 MG/ML  SOLN COMPARISON:  None. FINDINGS: Lower chest: The visualized lung bases are clear. Multi vessel coronary vascular calcification as well as calcification of the mitral annulus. No intra-abdominal free air. There is trace free fluid within the pelvis. Hepatobiliary: The liver is  unremarkable. No intrahepatic biliary ductal dilatation. Apparent mild thickening of the gallbladder wall which may be related to chronic cholecystitis. No calcified stone or pericholecystic fluid. Ultrasound may provide better evaluation if clinically indicated. Pancreas: Unremarkable. No pancreatic ductal dilatation or surrounding inflammatory changes. Spleen: Normal in size without focal abnormality. Adrenals/Urinary Tract: The adrenal glands are unremarkable. There is no hydronephrosis on either side. Bilateral renal cysts measure up to 3 cm in the lower pole of the right kidney. Additional smaller hypodensities are too small to characterize. There is symmetric enhancement and excretion of contrast by both kidneys. The visualized ureters and urinary bladder are unremarkable. Stomach/Bowel: Postsurgical changes of partial sigmoid resection with anastomotic suture. There is extensive sigmoid and diffuse colonic diverticulosis without active inflammatory changes. Diffusely dilated loops of small bowel throughout the abdomen some of which are fecalized measuring up to 3.6 cm in caliber. There is a transition in the right lower quadrant (series 5, image 66 and coronal series 2 image 67). There is slight swirling of the mesentery at this level which is likely postsurgical changes. Findings most consistent with obstruction likely secondary to adhesion although an internal hernia is not entirely excluded. There is no pneumatosis. There is a 6.5 cm duodenal diverticulum. Vascular/Lymphatic: Advanced aortoiliac atherosclerotic disease. There is a 2.5 cm infrarenal aortic ectasia. The origins of the celiac axis, SMA, IMA are patent. No portal venous gas. There is no adenopathy. Reproductive: Enlarged prostate gland measuring 6.5 cm in transverse axial diameter. Other: None  Musculoskeletal: Degenerative changes of the spine. No acute osseous pathology. IMPRESSION: 1. Small-bowel obstruction with transition zone in the right  lower quadrant likely secondary to adhesion although an internal hernia is not entirely excluded. No pneumatosis or portal venous gas. 2. Extensive colonic diverticulosis. 3. Aortic Atherosclerosis (ICD10-I70.0). Electronically Signed   By: Anner Crete M.D.   On: 05/08/2019 03:38    Pending Labs Unresulted Labs (From admission, onward)   None      Vitals/Pain Today's Vitals   05/08/19 1600 05/08/19 1630 05/08/19 1742 05/08/19 1830  BP: (!) 133/100 (!) 127/94 (!) 148/90 (!) 158/75  Pulse: 72 (!) 40 75 70  Resp:   16 15  Temp:      TempSrc:      SpO2: 90% (!) 85% 94%   PainSc:        Isolation Precautions No active isolations  Medications Medications  sodium chloride (PF) 0.9 % injection (has no administration in time range)  morphine 2 MG/ML injection 1 mg (1 mg Intravenous Given 05/08/19 1336)  ondansetron (ZOFRAN) injection 4 mg (has no administration in time range)  acetaminophen (TYLENOL) tablet 650 mg (has no administration in time range)    Or  acetaminophen (TYLENOL) suppository 650 mg (has no administration in time range)  insulin aspart (novoLOG) injection 0-9 Units (0 Units Subcutaneous Not Given 05/08/19 1556)  0.9 %  sodium chloride infusion ( Intravenous New Bag/Given 05/08/19 0532)  ipratropium-albuterol (DUONEB) 0.5-2.5 (3) MG/3ML nebulizer solution 3 mL (has no administration in time range)  diatrizoate meglumine-sodium (GASTROGRAFIN) 66-10 % solution 90 mL (has no administration in time range)  morphine 4 MG/ML injection 4 mg (4 mg Intravenous Given 05/08/19 0132)  ondansetron (ZOFRAN) injection 4 mg (4 mg Intravenous Given 05/08/19 0132)  iohexol (OMNIPAQUE) 300 MG/ML solution 100 mL (100 mLs Intravenous Contrast Given 05/08/19 0310)    Mobility walks

## 2019-05-08 NOTE — ED Notes (Signed)
Pt refused G-Tube insertion.

## 2019-05-08 NOTE — Consult Note (Signed)
[facility] Surgery Consult Note  Juan Valdez [facility] 08-Nov-1945  [id number].    Requesting MD: Dr. Myrene Buddy Chief Complaint/Reason for Consult: SBO  HPI: Juan Valdez is a 73 y.o. male with a history of CAD s/p PCI (2003), A. fib on Eliquis (last dose last night), COPD, DM2, HTN, HLD, anemia and a remote hx of lung cancer who presented with abdominal pain.   Patient reports that he was in his normal state of health yesterday when around 2-3 PM he began having lower abdominal pain that he describes as pressure, severe with associated abdominal bloating/distention.  He denied any associated nausea or vomiting.  He reports he was able to pass a small amounts of flatus and a small, soft BM around 5 PM that night but has not had any flatus since that time.  He was able to tolerate some saltines as well as a egg and cheese sandwich around 8 PM last night but it made his pain worse.  He did not try any interventions prior to coming to the hospital.  He reports that he had a similar episode of abdominal pain/bloating/distention that was more generalized on 8/20 for which she saw GI for but this resolved. He feels this is different. Patient underwent work-up in the ED that showed a small bowel obstruction with transition point in the right lower quadrant likely secondary to adhesions although a internal hernia is not entirely occluded on CT.  There was no pneumatosis or perforation.  Patient does have a history of a exploratory laparotomy back in 1992, in [place] for which he believes was secondary to a perforated bowel and he believes he had a colectomy with primary anastomosis but he is not entirely sure.  General surgery was asked to see.  ROS: Review of Systems  Constitutional: Negative for chills and fever.  Respiratory: Negative for cough and shortness of breath.   Cardiovascular: Negative for chest pain and leg swelling.  Gastrointestinal: Positive for abdominal pain and  constipation. Negative for blood in stool, diarrhea, melena, nausea and vomiting.  Genitourinary: Negative for dysuria.  All other systems reviewed and are negative. All systems reviewed and otherwise negative except for as above  Family History  Adopted: Yes    Past Medical History:  Diagnosis Date  . Anemia   . Atrial fibrillation ([place]) 09/07/2018  . CAD (coronary artery disease)   . Colon polyp   . COPD (chronic obstructive pulmonary disease) ([place])   . Diabetes ([place])   . Diverticulosis   . GERD (gastroesophageal reflux disease)   . Gout   . HLD (hyperlipidemia)   . Lung cancer ([place])   . Perforated bowel [facility])     Past Surgical History:  Procedure Laterality Date  . ABDOMINAL SURGERY    . CORONARY ANGIOPLASTY WITH STENT PLACEMENT  2003  . LUNG CANCER SURGERY    . ROTATOR CUFF REPAIR Left   . TONSILLECTOMY     age 80    Social History:  reports that he quit smoking about 4 years ago. His smoking use included cigarettes. He started smoking about 62 years ago. He has a 171.00 pack-year smoking history. He has never used smokeless tobacco. No history on file for alcohol and drug.   Patient reports he quit smoking approximately 4 years ago.  He has greater than 100-pack-year history He does not use alcohol or illicit drugs He lives at home with his fiance, Vicente Males He is retired, he was a traveling Hotel manager He does  not use any assistive devices for walking and does all of his ADLs without difficulty  Allergies: No Known Allergies  (Not in a hospital admission)   Prior to Admission medications   Medication Sig Start Date End Date Taking? Authorizing Provider  albuterol (PROVENTIL) (2.5 MG/3ML) 0.083% nebulizer solution Take 3 mLs (2.5 mg total) by nebulization every 6 (six) hours as needed for up to 4 doses for wheezing or shortness of breath. 03/14/19  Yes Lauraine Rinne, NP  albuterol (VENTOLIN HFA) 108 (90 Base) MCG/ACT inhaler Use 2 puffs every 4-6 hours as needed for  wheezing or shortness of breath 04/04/19  Yes Lauraine Rinne, NP  allopurinol (ZYLOPRIM) 300 MG tablet Take 300 mg by mouth daily.   Yes [provider]  apixaban (ELIQUIS) 5 MG TABS tablet Take 1 tablet (5 mg total) by mouth 2 (two) times daily. 01/12/19  Yes Revankar, Reita Cliche, MD  atorvastatin (LIPITOR) 40 MG tablet Take 40 mg by mouth daily.   Yes [provider]  dicyclomine (BENTYL) 10 MG capsule Take 1 capsule by mouth every 6 (six) hours as needed for spasms.  04/17/19  Yes [provider]  Fluticasone-Umeclidin-Vilant (TRELEGY ELLIPTA) 100-62.5-25 MCG/INH AEPB Inhale 1 puff into the lungs daily. 04/04/19  Yes Lauraine Rinne, NP  furosemide (LASIX) 20 MG tablet Take 20 mg (1 Tab) every other day 01/25/19  Yes Revankar, Reita Cliche, MD  ipratropium (ATROVENT) 0.03 % nasal spray Place 2 sprays into both nostrils every 12 (twelve) hours as needed for rhinitis.    Yes [provider]  metoprolol succinate (TOPROL-XL) 25 MG 24 hr tablet Take 25 mg by mouth daily. 03/30/19  Yes [provider]  nitroGLYCERIN (NITROSTAT) 0.4 MG SL tablet Place 0.4 mg under the tongue every 5 (five) minutes as needed for chest pain.   Yes [provider]  omeprazole (PRILOSEC) 40 MG capsule Take 40 mg by mouth 2 (two) times daily.   Yes [provider]  tamsulosin (FLOMAX) 0.4 MG CAPS capsule Take 0.4 mg by mouth daily after supper.    Yes [provider]  traMADol (ULTRAM) 50 MG tablet Take 50 mg by mouth every 6 (six) hours as needed for moderate pain.  02/07/19  Yes [provider]  traZODone (DESYREL) 100 MG tablet Take 100 mg by mouth at bedtime.   Yes [provider]    Blood pressure (!) 144/78, pulse 70, temperature 98 F (36.7 C), temperature source Oral, resp. rate 16, SpO2 (!) 83 %. Physical Exam: General: pleasant, WD/WN white male who is laying in bed in NAD HEENT: head is normocephalic, atraumatic.  Sclera are noninjected.   Pupils equal and round.  Ears and nose without any masses or lesions.  Mouth is pink and moist. Dentition fair Heart: regular rate, and irregular rhythm.  Palpable pedal pulses bilaterally Lungs: CTA b/l. No wheezes, rhonchi, or rales noted.  Respiratory effort nonlabored Abd: Distended but soft, generalized tenderness worse on the right lower quadrant  with some voluntary guarding. No rebound, rigidity or involuntary guarding. No peritonitis. High pitched bowel sounds.  MS: all 4 extremities are symmetrical with no cyanosis, clubbing, or edema. Skin: warm and dry with no masses, lesions, or rashes Psych: A&Ox3 with an appropriate affect. Neuro: cranial nerves grossly intact, extremity CSM intact bilaterally, normal speech  Prior midline laparotomy scar. Well healed.     Results for orders placed or performed during the hospital encounter of 05/08/19 (from the past 48  hour(s))  Comprehensive metabolic panel     Status: Abnormal   Collection Time: 05/08/19  1:24 AM  Result Value Ref Range   Sodium 137 135 - 145 mmol/L   Potassium 3.8 3.5 - 5.1 mmol/L   Chloride 95 (L) 98 - 111 mmol/L   CO2 29 22 - 32 mmol/L   Glucose, Bld 184 (H) 70 - 99 mg/dL   BUN 26 (H) 8 - 23 mg/dL   Creatinine, Ser 1.30 (H) 0.61 - 1.24 mg/dL   Calcium 9.9 8.9 - 10.3 mg/dL   Total Protein 7.3 6.5 - 8.1 g/dL   Albumin 4.4 3.5 - 5.0 g/dL   AST 20 15 - 41 U/L   ALT 20 0 - 44 U/L   Alkaline Phosphatase 109 38 - 126 U/L   Total Bilirubin 1.4 (H) 0.3 - 1.2 mg/dL   GFR calc non Af Amer 54 (L) >60 mL/min   GFR calc Af Amer >60 >60 mL/min   Anion gap 13 5 - 15    Comment: Performed at [facility], [place] [address], [place], [place] [address]  Lipase, blood     Status: None   Collection Time: 05/08/19  1:24 AM  Result Value Ref Range   Lipase 26 11 - 51 U/L    Comment: Performed at [facility], [place] [address] [place], [place] [address]  CBC with Differential     Status: Abnormal    Collection Time: 05/08/19  1:24 AM  Result Value Ref Range   WBC 10.2 4.0 - 10.5 K/uL   RBC 5.50 4.22 - 5.81 MIL/uL   Hemoglobin 16.5 13.0 - 17.0 g/dL   HCT 49.9 39.0 - 52.0 %   MCV 90.7 80.0 - 100.0 fL   MCH 30.0 26.0 - 34.0 pg   MCHC 33.1 30.0 - 36.0 g/dL   RDW 14.8 11.5 - 15.5 %   Platelets 122 (L) 150 - 400 K/uL   nRBC 0.0 0.0 - 0.2 %   Neutrophils Relative % 86 %   Neutro Abs 8.7 (H) 1.7 - 7.7 K/uL   Lymphocytes Relative 6 %   Lymphs Abs 0.6 (L) 0.7 - 4.0 K/uL   Monocytes Relative 6 %   Monocytes Absolute 0.6 0.1 - 1.0 K/uL   Eosinophils Relative 1 %   Eosinophils Absolute 0.1 0.0 - 0.5 K/uL   Basophils Relative 0 %   Basophils Absolute 0.0 0.0 - 0.1 K/uL   Immature Granulocytes 1 %   Abs Immature Granulocytes 0.07 0.00 - 0.07 K/uL    Comment: Performed at [facility], [place] [address], [place], [place] [address]  Urinalysis, Routine w reflex microscopic     Status: Abnormal   Collection Time: 05/08/19  1:24 AM  Result Value Ref Range   Color, Urine YELLOW YELLOW   APPearance HAZY (A) CLEAR   Specific Gravity, Urine 1.020 1.005 - 1.030   pH 6.0 5.0 - 8.0   Glucose, UA NEGATIVE NEGATIVE mg/dL   Hgb urine dipstick NEGATIVE NEGATIVE   Bilirubin Urine NEGATIVE NEGATIVE   Ketones, ur NEGATIVE NEGATIVE mg/dL   Protein, ur 100 (A) NEGATIVE mg/dL   Nitrite NEGATIVE NEGATIVE   Leukocytes,Ua NEGATIVE NEGATIVE   RBC / HPF 0-5 0 - 5 RBC/hpf   WBC, UA 0-5 0 - 5 WBC/hpf   Bacteria, UA RARE (A) NONE SEEN   Squamous Epithelial / LPF 0-5 0 - 5   Mucus PRESENT     Comment: Performed at  [facility], [place] [address], [place], [place] [address]  Hemoglobin A1c     Status: Abnormal   Collection Time: 05/08/19  5:27 AM  Result Value Ref Range   Hgb A1c MFr Bld 7.0 (H) 4.8 - 5.6 %    Comment: (NOTE) Pre diabetes:          5.7%-6.4% Diabetes:              >6.4% Glycemic control for   <7.0% adults with diabetes    Mean Plasma Glucose 154.2  mg/dL    Comment: Performed at [place] [address], [place], [place] [address]  CBG monitoring, ED     Status: Abnormal   Collection Time: 05/08/19  5:30 AM  Result Value Ref Range   Glucose-Capillary 168 (H) 70 - 99 mg/dL   Ct Abdomen Pelvis W Contrast  Result Date: 05/08/2019 CLINICAL DATA:  73 year old male with abdominal pain and tenderness. Difficulty with bowel movement. EXAM: CT ABDOMEN AND PELVIS WITH CONTRAST TECHNIQUE: Multidetector CT imaging of the abdomen and pelvis was performed using the standard protocol following bolus administration of intravenous contrast. CONTRAST:  136mL OMNIPAQUE IOHEXOL 300 MG/ML  SOLN COMPARISON:  None. FINDINGS: Lower chest: The visualized lung bases are clear. Multi vessel coronary vascular calcification as well as calcification of the mitral annulus. No intra-abdominal free air. There is trace free fluid within the pelvis. Hepatobiliary: The liver is unremarkable. No intrahepatic biliary ductal dilatation. Apparent mild thickening of the gallbladder wall which may be related to chronic cholecystitis. No calcified stone or pericholecystic fluid. Ultrasound may provide better evaluation if clinically indicated. Pancreas: Unremarkable. No pancreatic ductal dilatation or surrounding inflammatory changes. Spleen: Normal in size without focal abnormality. Adrenals/Urinary Tract: The adrenal glands are unremarkable. There is no hydronephrosis on either side. Bilateral renal cysts measure up to 3 cm in the lower pole of the right kidney. Additional smaller hypodensities are too small to characterize. There is symmetric enhancement and excretion of contrast by both kidneys. The visualized ureters and urinary bladder are unremarkable. Stomach/Bowel: Postsurgical changes of partial sigmoid resection with anastomotic suture. There is extensive sigmoid and diffuse colonic diverticulosis without active inflammatory changes. Diffusely dilated loops of small bowel  throughout the abdomen some of which are fecalized measuring up to 3.6 cm in caliber. There is a transition in the right lower quadrant (series 5, image 66 and coronal series 2 image 67). There is slight swirling of the mesentery at this level which is likely postsurgical changes. Findings most consistent with obstruction likely secondary to adhesion although an internal hernia is not entirely excluded. There is no pneumatosis. There is a 6.5 cm duodenal diverticulum. Vascular/Lymphatic: Advanced aortoiliac atherosclerotic disease. There is a 2.5 cm infrarenal aortic ectasia. The origins of the celiac axis, SMA, IMA are patent. No portal venous gas. There is no adenopathy. Reproductive: Enlarged prostate gland measuring 6.5 cm in transverse axial diameter. Other: None Musculoskeletal: Degenerative changes of the spine. No acute osseous pathology. IMPRESSION: 1. Small-bowel obstruction with transition zone in the right lower quadrant likely secondary to adhesion although an internal hernia is not entirely excluded. No pneumatosis or portal venous gas. 2. Extensive colonic diverticulosis. 3. Aortic Atherosclerosis (ICD10-I70.0). Electronically Signed   By: Anner Crete M.D.   On: 05/08/2019 03:38   Anti-infectives (From admission, onward)   None     Assessment/Plan Remote hx of Lung Cancer  CAD s/p PCI (2003) A. fib on Eliquis (last dose last night) COPD  DM2 - A1c 7.0 HTN HLD Hx of anemia   SBO - Hx of Ex Lap reportedly in 1992 in [place] for a reported perforated bowel - Likely from adhesions. There was questionable internal hernia on CT. Exam without peritonitis. No indication for emergent surgery - SBO protocol - Mobilize for bowel function - Keep K > 4 and Mg < 2 for bowel function - We will continue to follow   ID - None VTE - SCDs, Eliquis on hold. Okay for IV heparin.  FEN - NPO, IVF, K 3.8, Mg 1.9 Foley - None Follow up - TBD   Jillyn Ledger, [facility]  Surgery 05/08/2019, 8:51 AM Pager: [phone]

## 2019-05-08 NOTE — ED Notes (Signed)
Report called to [facility]

## 2019-05-08 NOTE — ED Triage Notes (Signed)
Pt arrived from home with complaints of abdominal pain, stating his abdomen is now distended and tender to touch. Reporting he had a change of medication today and noticed some difficulty having a bowel movement

## 2019-05-08 NOTE — Progress Notes (Signed)
PROGRESS NOTE    Juan Valdez  TSV:[id number] DOB: 09/17/1945 DOA: 05/08/2019 PCP: Juan Hipps, MD      Brief Narrative:  Mr. Juan Valdez is a 73 y.o. M with Afib on Eliquis, CAD s/p PCI in 2003, COPD not on O2, DM, NSCLC stage I s/p resection in 2014, HTN and hx partial SBO who presented with abdominal pain and distension.  In the ER, CT showed a small bowel obstruction.      Assessment & Plan:  SBO CT without pneumatosis or other high grade features. -NPO and MIVF -NG tube to LIS -Small bowel protocol -Consult General Surgery, appreciate cares   Hypertension CAD Reduced EF without history CHF Echo this year showed EF 40-45%. -Hold Lasix until able to take PO  Paroxysmal atrial fibrillation -Hold Eliquis, metoprolol until able to take PO  CKD III  Baseline Cr 1.2, stable relative to that  Diabetes -Hold atorvastatin until able to take PO  COPD No active disease -Hold Trelegy for now  Other medications -Hold allopurinol, Flomax until able to take PO    DVT prophylaxis: SCDs Code Status: FULL Family Communication:  MDM and disposition Plan: This is a no charge note.  For further details, please see H&P by my partner Dr. Marlowe Valdez from earlier today.  The below labs and imaging reports were reviewed and summarized above.    The patient was admitted with SBO.  He will remain NPO and have an NG tube placed for gastrograffin administration.  We will follow serial radiographs and flatus/BM.  For now unable to take any PO.        Objective: Vitals:   05/08/19 0715 05/08/19 0730 05/08/19 0745 05/08/19 0800  BP:  135/71  (!) 144/78  Pulse: 75 67 (!) 55 70  Resp:    16  Temp:      TempSrc:      SpO2: 90% (!) 86% (!) 89% (!) 83%   No intake or output data in the 24 hours ending 05/08/19 0932 There were no vitals filed for this visit.  Examination: The patient was seen and examined.      Data Reviewed: I have personally reviewed following labs  and imaging studies:  CBC: Recent Labs  Lab 05/08/19 0124  WBC 10.2  NEUTROABS 8.7*  HGB 16.5  HCT 49.9  MCV 90.7  PLT 923*   Basic Metabolic Panel: Recent Labs  Lab 05/08/19 0124  NA 137  K 3.8  CL 95*  CO2 29  GLUCOSE 184*  BUN 26*  CREATININE 1.30*  CALCIUM 9.9  MG 1.9   GFR: Estimated Creatinine Clearance: 53.3 mL/min (A) (by C-G formula based on SCr of 1.3 mg/dL (H)). Liver Function Tests: Recent Labs  Lab 05/08/19 0124  AST 20  ALT 20  ALKPHOS 109  BILITOT 1.4*  PROT 7.3  ALBUMIN 4.4   Recent Labs  Lab 05/08/19 0124  LIPASE 26   No results for input(s): AMMONIA in the last 168 hours. Coagulation Profile: No results for input(s): INR, PROTIME in the last 168 hours. Cardiac Enzymes: No results for input(s): CKTOTAL, CKMB, CKMBINDEX, TROPONINI in the last 168 hours. BNP (last 3 results) No results for input(s): PROBNP in the last 8760 hours. HbA1C: Recent Labs    05/08/19 0527  HGBA1C 7.0*   CBG: Recent Labs  Lab 05/08/19 0530  GLUCAP 168*   Lipid Profile: No results for input(s): CHOL, HDL, LDLCALC, TRIG, CHOLHDL, LDLDIRECT in the last 72 hours. Thyroid Function Tests: No  results for input(s): TSH, T4TOTAL, FREET4, T3FREE, THYROIDAB in the last 72 hours. Anemia Panel: No results for input(s): VITAMINB12, FOLATE, FERRITIN, TIBC, IRON, RETICCTPCT in the last 72 hours. Urine analysis:    Component Value Date/Time   COLORURINE YELLOW 05/08/2019 0124   APPEARANCEUR HAZY (A) 05/08/2019 0124   LABSPEC 1.020 05/08/2019 0124   PHURINE 6.0 05/08/2019 0124   GLUCOSEU NEGATIVE 05/08/2019 0124   HGBUR NEGATIVE 05/08/2019 0124   BILIRUBINUR NEGATIVE 05/08/2019 0124   KETONESUR NEGATIVE 05/08/2019 0124   PROTEINUR 100 (A) 05/08/2019 0124   NITRITE NEGATIVE 05/08/2019 0124   LEUKOCYTESUR NEGATIVE 05/08/2019 0124   Sepsis Labs: @LABRCNTIP (procalcitonin:4,lacticacidven:4)  )No results found for this or any previous visit (from the past 240  hour(s)).       Radiology Studies: Ct Abdomen Pelvis W Contrast  Result Date: 05/08/2019 CLINICAL DATA:  73 year old male with abdominal pain and tenderness. Difficulty with bowel movement. EXAM: CT ABDOMEN AND PELVIS WITH CONTRAST TECHNIQUE: Multidetector CT imaging of the abdomen and pelvis was performed using the standard protocol following bolus administration of intravenous contrast. CONTRAST:  197mL OMNIPAQUE IOHEXOL 300 MG/ML  SOLN COMPARISON:  None. FINDINGS: Lower chest: The visualized lung bases are clear. Multi vessel coronary vascular calcification as well as calcification of the mitral annulus. No intra-abdominal free air. There is trace free fluid within the pelvis. Hepatobiliary: The liver is unremarkable. No intrahepatic biliary ductal dilatation. Apparent mild thickening of the gallbladder wall which may be related to chronic cholecystitis. No calcified stone or pericholecystic fluid. Ultrasound may provide better evaluation if clinically indicated. Pancreas: Unremarkable. No pancreatic ductal dilatation or surrounding inflammatory changes. Spleen: Normal in size without focal abnormality. Adrenals/Urinary Tract: The adrenal glands are unremarkable. There is no hydronephrosis on either side. Bilateral renal cysts measure up to 3 cm in the lower pole of the right kidney. Additional smaller hypodensities are too small to characterize. There is symmetric enhancement and excretion of contrast by both kidneys. The visualized ureters and urinary bladder are unremarkable. Stomach/Bowel: Postsurgical changes of partial sigmoid resection with anastomotic suture. There is extensive sigmoid and diffuse colonic diverticulosis without active inflammatory changes. Diffusely dilated loops of small bowel throughout the abdomen some of which are fecalized measuring up to 3.6 cm in caliber. There is a transition in the right lower quadrant (series 5, image 66 and coronal series 2 image 67). There is slight  swirling of the mesentery at this level which is likely postsurgical changes. Findings most consistent with obstruction likely secondary to adhesion although an internal hernia is not entirely excluded. There is no pneumatosis. There is a 6.5 cm duodenal diverticulum. Vascular/Lymphatic: Advanced aortoiliac atherosclerotic disease. There is a 2.5 cm infrarenal aortic ectasia. The origins of the celiac axis, SMA, IMA are patent. No portal venous gas. There is no adenopathy. Reproductive: Enlarged prostate gland measuring 6.5 cm in transverse axial diameter. Other: None Musculoskeletal: Degenerative changes of the spine. No acute osseous pathology. IMPRESSION: 1. Small-bowel obstruction with transition zone in the right lower quadrant likely secondary to adhesion although an internal hernia is not entirely excluded. No pneumatosis or portal venous gas. 2. Extensive colonic diverticulosis. 3. Aortic Atherosclerosis (ICD10-I70.0). Electronically Signed   By: Anner Crete M.D.   On: 05/08/2019 03:38        Scheduled Meds: . diatrizoate meglumine-sodium  90 mL Per NG tube Once  . insulin aspart  0-9 Units Subcutaneous Q4H  . sodium chloride (PF)       Continuous Infusions: . sodium chloride  125 mL/hr at 05/08/19 0532     LOS: 0 days    Time spent: 15 minutes    Edwin Dada, MD [facility] 05/08/2019, 9:32 AM     Pager [phone] --- please page though AMION:  [email or web address] If 7PM-7AM, please contact night-coverage

## 2019-05-08 NOTE — Telephone Encounter (Signed)
Called and spoke w/ pt. Pt reports that he is currently admitted at [facility] and is about to about to have hernia surgery. He was swabbed for COVID today 05/08/2019 and is waiting to hear back on the results of that.  Pt's main concern is his PFT coming up 05/16/2019 followed by a visit with BPM. He is unsure if he will have to reschedule this yet and he states he will let us know after the surgery. He questions the COVID pre-procedure test, which I informed him that they typically swab 4-7 days before the test. Pt is wondering if his  COVID test he got done today would suffice. I let him know I would route this message to someone who would know more than I about the pre-procedure COVID tests and get back with im. Pt expressed understanding.   Routing to [employer], Therapist, sports. Burman Nieves, please advise. Thank you.

## 2019-05-08 NOTE — Telephone Encounter (Signed)
Called and spoke to patient. Patient reports that he isn't feeling well and in the hospital. Talked with patient and he will call us to let us know when he will be out of the hospital. It is not ideal to perform PFT soon after surgery (especialy an abdominal surgery) because he may not be able to perform his best breathing techniques due to pain.  Patient stated he may need to reschedule PFT but doesn't want to make that call just yet.  We will await return call from patient in several days.

## 2019-05-09 ENCOUNTER — Inpatient Hospital Stay (HOSPITAL_COMMUNITY): Payer: Medicare Other

## 2019-05-09 LAB — CBC WITH DIFFERENTIAL/PLATELET
Abs Immature Granulocytes: 0.03 10*3/uL (ref 0.00–0.07)
Basophils Absolute: 0 10*3/uL (ref 0.0–0.1)
Basophils Relative: 0 %
Eosinophils Absolute: 0.1 10*3/uL (ref 0.0–0.5)
Eosinophils Relative: 1 %
HCT: 49.9 % (ref 39.0–52.0)
Hemoglobin: 16.5 g/dL (ref 13.0–17.0)
Immature Granulocytes: 0 %
Lymphocytes Relative: 11 %
Lymphs Abs: 0.9 10*3/uL (ref 0.7–4.0)
MCH: 30.4 pg (ref 26.0–34.0)
MCHC: 33.1 g/dL (ref 30.0–36.0)
MCV: 92.1 fL (ref 80.0–100.0)
Monocytes Absolute: 0.9 10*3/uL (ref 0.1–1.0)
Monocytes Relative: 10 %
Neutro Abs: 6.5 10*3/uL (ref 1.7–7.7)
Neutrophils Relative %: 78 %
Platelets: 126 10*3/uL — ABNORMAL LOW (ref 150–400)
RBC: 5.42 MIL/uL (ref 4.22–5.81)
RDW: 15 % (ref 11.5–15.5)
WBC: 8.4 10*3/uL (ref 4.0–10.5)
nRBC: 0 % (ref 0.0–0.2)

## 2019-05-09 LAB — COMPREHENSIVE METABOLIC PANEL
ALT: 18 U/L (ref 0–44)
AST: 18 U/L (ref 15–41)
Albumin: 4.4 g/dL (ref 3.5–5.0)
Alkaline Phosphatase: 122 U/L (ref 38–126)
Anion gap: 12 (ref 5–15)
BUN: 20 mg/dL (ref 8–23)
CO2: 26 mmol/L (ref 22–32)
Calcium: 10.1 mg/dL (ref 8.9–10.3)
Chloride: 98 mmol/L (ref 98–111)
Creatinine, Ser: 1.23 mg/dL (ref 0.61–1.24)
GFR calc Af Amer: >60 mL/min (ref >60)
GFR calc non Af Amer: 57 mL/min — ABNORMAL LOW (ref >60)
Glucose, Bld: 121 mg/dL — ABNORMAL HIGH (ref 70–99)
Potassium: 4.2 mmol/L (ref 3.5–5.1)
Sodium: 136 mmol/L (ref 135–145)
Total Bilirubin: 2.2 mg/dL — ABNORMAL HIGH (ref 0.3–1.2)
Total Protein: 7.5 g/dL (ref 6.5–8.1)

## 2019-05-09 LAB — GLUCOSE, CAPILLARY
Glucose-Capillary: 102 mg/dL — ABNORMAL HIGH (ref 70–99)
Glucose-Capillary: 114 mg/dL — ABNORMAL HIGH (ref 70–99)
Glucose-Capillary: 116 mg/dL — ABNORMAL HIGH (ref 70–99)
Glucose-Capillary: 117 mg/dL — ABNORMAL HIGH (ref 70–99)
Glucose-Capillary: 131 mg/dL — ABNORMAL HIGH (ref 70–99)
Glucose-Capillary: 138 mg/dL — ABNORMAL HIGH (ref 70–99)

## 2019-05-09 LAB — PROTIME-INR
INR: 1 (ref 0.8–1.2)
Prothrombin Time: 13.1 seconds (ref 11.4–15.2)

## 2019-05-09 LAB — APTT
aPTT: 32 seconds (ref 24–36)
aPTT: 63 seconds — ABNORMAL HIGH (ref 24–36)

## 2019-05-09 LAB — HEPARIN LEVEL (UNFRACTIONATED): Heparin Unfractionated: 1.22 IU/mL — ABNORMAL HIGH (ref 0.30–0.70)

## 2019-05-09 MED ORDER — DIATRIZOATE MEGLUMINE & SODIUM 66-10 % PO SOLN
90.0000 mL | Freq: Once | ORAL | Status: AC
  Administered 2019-05-09: 90 mL via ORAL
  Filled 2019-05-09: qty 90

## 2019-05-09 MED ORDER — HEPARIN BOLUS VIA INFUSION
4000.0000 [IU] | Freq: Once | INTRAVENOUS | Status: AC
  Administered 2019-05-09: 4000 [IU] via INTRAVENOUS
  Filled 2019-05-09: qty 4000

## 2019-05-09 MED ORDER — HEPARIN (PORCINE) 25000 UT/250ML-% IV SOLN
1300.0000 [IU]/h | INTRAVENOUS | Status: DC
  Administered 2019-05-09: 13:00:00 1200 [IU]/h via INTRAVENOUS
  Administered 2019-05-10: 07:00:00 1300 [IU]/h via INTRAVENOUS
  Filled 2019-05-09 (×2): qty 250

## 2019-05-09 NOTE — Plan of Care (Signed)
Continue current POC 

## 2019-05-09 NOTE — Plan of Care (Signed)
Plan of care reviewed and discussed with the patient. 

## 2019-05-09 NOTE — Progress Notes (Signed)
ANTICOAGULATION CONSULT NOTE - Initial Consult  Pharmacy Consult for Heparin Indication: atrial fibrillation  No Known Allergies  Patient Measurements: Height: 5' 8.27" (173.4 cm) Weight: 188 lb 4.4 oz (85.4 kg) IBW/kg (Calculated) : 69.02 Heparin Dosing Weight: 85.4  Vital Signs: Temp: 98.2 F (36.8 C) (08/26 1421) BP: 116/98 (08/26 1421) Pulse Rate: 71 (08/26 1421)  Labs: Recent Labs    05/08/19 0124 05/09/19 0809  HGB 16.5 16.5  HCT 49.9 49.9  PLT 122* 126*  APTT  --  32  LABPROT  --  13.1  INR  --  1.0  HEPARINUNFRC  --  1.22*  CREATININE 1.30* 1.23    Estimated Creatinine Clearance: 56.3 mL/min (by C-G formula based on SCr of 1.23 mg/dL).   Medical History: Past Medical History:  Diagnosis Date  . Anemia   . Atrial fibrillation ([place]) 09/07/2018  . CAD (coronary artery disease)   . Colon polyp   . COPD (chronic obstructive pulmonary disease) ([place])   . Diabetes ([place])   . Diverticulosis   . GERD (gastroesophageal reflux disease)   . Gout   . HLD (hyperlipidemia)   . Lung cancer ([place])   . Perforated bowel ([place])     Medications:  Medications Prior to Admission  Medication Sig Dispense Refill Last Dose  . albuterol (PROVENTIL) (2.5 MG/3ML) 0.083% nebulizer solution Take 3 mLs (2.5 mg total) by nebulization every 6 (six) hours as needed for up to 4 doses for wheezing or shortness of breath. 1080 mL 0 Past Week at Unknown time  . albuterol (VENTOLIN HFA) 108 (90 Base) MCG/ACT inhaler Use 2 puffs every 4-6 hours as needed for wheezing or shortness of breath 24 g 3 05/07/2019 at Unknown time  . allopurinol (ZYLOPRIM) 300 MG tablet Take 300 mg by mouth daily.   05/07/2019 at Unknown time  . apixaban (ELIQUIS) 5 MG TABS tablet Take 1 tablet (5 mg total) by mouth 2 (two) times daily. 180 tablet 1 05/07/2019 at 930pm  . atorvastatin (LIPITOR) 40 MG tablet Take 40 mg by mouth daily.   05/07/2019 at Unknown time  . dicyclomine (BENTYL) 10 MG capsule Take 1 capsule by  mouth every 6 (six) hours as needed for spasms.    05/07/2019 at Unknown time  . Fluticasone-Umeclidin-Vilant (TRELEGY ELLIPTA) 100-62.5-25 MCG/INH AEPB Inhale 1 puff into the lungs daily. 3 each 3 05/07/2019 at Unknown time  . furosemide (LASIX) 20 MG tablet Take 20 mg (1 Tab) every other day 90 tablet 1 05/06/2019  . ipratropium (ATROVENT) 0.03 % nasal spray Place 2 sprays into both nostrils every 12 (twelve) hours as needed for rhinitis.    05/07/2019 at Unknown time  . metoprolol succinate (TOPROL-XL) 25 MG 24 hr tablet Take 25 mg by mouth daily.   05/07/2019 at 0900  . nitroGLYCERIN (NITROSTAT) 0.4 MG SL tablet Place 0.4 mg under the tongue every 5 (five) minutes as needed for chest pain.   never used  . omeprazole (PRILOSEC) 40 MG capsule Take 40 mg by mouth 2 (two) times daily.   05/07/2019 at Unknown time  . tamsulosin (FLOMAX) 0.4 MG CAPS capsule Take 0.4 mg by mouth daily after supper.    05/07/2019 at Unknown time  . traMADol (ULTRAM) 50 MG tablet Take 50 mg by mouth every 6 (six) hours as needed for moderate pain.    05/07/2019 at Unknown time  . traZODone (DESYREL) 100 MG tablet Take 100 mg by mouth at bedtime.   05/07/2019 at Unknown time  Assessment: 37 YOM with a PMH of Afib, on Eliquis, chronic constipation and small bowel obstruction. Presented to the ED on 05/07/49 with c/o abdominal pain and distension. Eliquis currently on hold, as pt is NPO. Pharmacy to dose heparin.   Prior Anticoagulation: Eliquis 5 mg BID, LD 05/07/19 @ 2130  Basline: INR normal, HL supratherapeutic, aPTT normal  Today, 05/09/19:  - CBC wnl, platelets slightly decreased - No s/sx of bleeding documented  Goal of Therapy:  Heparin level 0.3-0.7 units/ml aPTT 66-102 seconds Monitor platelets by anticoagulation protocol: Yes   Plan:  - Heparin 4,000 unit bolus x1  - Heparin infusion 1,200 units/hr  - Check aPTT in 8 hours. Will use aPTT to monitor for now due to elevated HL from DOAC  - Daily CBC, HL -  Monitor for s/sx of bleeding - Follow for possible surgical procedure   Jonathon Bellows, PharmD Candidate 05/09/2019 2:27 PM

## 2019-05-09 NOTE — Progress Notes (Signed)
ANTICOAGULATION CONSULT NOTE - Follow Up Consult  Pharmacy Consult for Heparin Indication: atrial fibrillation  No Known Allergies  Patient Measurements: Height: 5' 8.27" (173.4 cm) Weight: 188 lb 4.4 oz (85.4 kg) IBW/kg (Calculated) : 69.02 Heparin Dosing Weight:   Vital Signs: Temp: 98 F (36.7 C) (08/26 2127) BP: 147/87 (08/26 2127) Pulse Rate: 72 (08/26 2127)  Labs: Recent Labs    05/08/19 0124 05/09/19 0809 05/09/19 2020  HGB 16.5 16.5  --   HCT 49.9 49.9  --   PLT 122* 126*  --   APTT  --  32 63*  LABPROT  --  13.1  --   INR  --  1.0  --   HEPARINUNFRC  --  1.22*  --   CREATININE 1.30* 1.23  --     Estimated Creatinine Clearance: 56.3 mL/min (by C-G formula based on SCr of 1.23 mg/dL).   Medications:  Infusions:  . heparin 1,200 Units/hr (05/09/19 1243)    Assessment: Patient with PTT just below goal.  No heparin issues per RN. PTT ordered with Heparin level until both correlate due to possible drug-lab interaction between oral anticoagulant (rivaroxaban, edoxaban, or apixaban) and anti-Xa level (aka heparin level)   Goal of Therapy:  Heparin level 0.3-0.7 units/ml aPTT 66-102 seconds Monitor platelets by anticoagulation protocol: Yes   Plan:  Increase heparin to 1300 units/hr Recheck PTT/HL at [place], Shea St[place] 05/09/2019,10:48 PM

## 2019-05-09 NOTE — Progress Notes (Signed)
    CC: Abdominal pain  Subjective: He is doing well this morning.  He is passing some flatus, no BM.  He still somewhat distended, his abdomen is soft.  No nausea or vomiting.  Objective: Vital signs in last 24 hours: Temp:  [97.7 F (36.5 C)-98.4 F (36.9 C)] 97.7 F (36.5 C) (08/26 0625) Pulse Rate:  [40-84] 66 (08/26 0625) Resp:  [15-18] 18 (08/26 0625) BP: (127-158)/(69-100) 128/69 (08/26 0625) SpO2:  [85 %-97 %] 96 % (08/26 0625) Weight:  [85.4 kg] 85.4 kg (08/25 1953) Last BM Date: 05/07/19 No I/O Afebrile, VSS Labs creatinine improving A.m. film has not been read yet but shows ongoing abdominal distention. SD dilitation Intake/Output from previous day: No intake/output data recorded. Intake/Output this shift: No intake/output data recorded.  General appearance: alert, cooperative and no distress GI: soft still distended, but not uncomfortable, passing flatus  Lab Results:  Recent Labs    05/08/19 0124 05/09/19 0809  WBC 10.2 8.4  HGB 16.5 16.5  HCT 49.9 49.9  PLT 122* 126*    BMET Recent Labs    05/08/19 0124 05/09/19 0809  NA 137 136  K 3.8 4.2  CL 95* 98  CO2 29 26  GLUCOSE 184* 121*  BUN 26* 20  CREATININE 1.30* 1.23  CALCIUM 9.9 10.1   PT/INR Recent Labs    05/09/19 0809  LABPROT 13.1  INR 1.0    Recent Labs  Lab 05/08/19 0124 05/09/19 0809  AST 20 18  ALT 20 18  ALKPHOS 109 122  BILITOT 1.4* 2.2*  PROT 7.3 7.5  ALBUMIN 4.4 4.4     Lipase     Component Value Date/Time   LIPASE 26 05/08/2019 0124     Medications: . diatrizoate meglumine-sodium  90 mL Per NG tube Once  . heparin  4,000 Units Intravenous Once  . insulin aspart  0-9 Units Subcutaneous Q4H    Assessment/Plan Remote hx of Lung Cancer  CAD s/p PCI (2003) A. fib on Eliquis (last dose last night) COPD DM2 - A1c 7.0 HTN HLD Hx of anemia   SBO - Hx of Ex Lap reportedly in 1992 in [place] for a reported perforated bowel - Likely from  adhesions. There was questionable internal hernia on CT. Exam without peritonitis. No indication for emergent surgery - SBO protocol - Mobilize for bowel function - Keep K > 4 and Mg < 2 for bowel function - We will continue to follow   ID - None VTE - SCDs, heparin.  FEN - NPO, IVF, K 3.8, Mg 1.9 Foley - None Follow up - TBD   Plan: I will have him do SBP with oral contrast.          LOS: 1 day    Juan Valdez 05/09/2019 [phone]

## 2019-05-09 NOTE — Progress Notes (Signed)
PROGRESS NOTE    Juan Valdez  NLG:[id number] DOB: 1945/10/27 DOA: 05/08/2019 PCP: Ronita Hipps, MD   Brief Narrative:   73 y.o. M with Afib on Eliquis, CAD s/p PCI in 2003, COPD not on O2, DM, NSCLC stage I s/p resection in 2014, HTN and hx partial SBO who presented with abdominal pain and distension.  In the ER, CT showed a small bowel obstruction.-Had NG tube apparently earlier  Assessment & Plan:  SBO CT without pneumatosis or other high grade features. -NPO and MIVF, passing flatus no stool -Small bowel protocol-imaging as per surgeons -deferring to General Surgery, appreciate cares  Hypertension CAD Reduced EF without history CHF Echo this year showed EF 40-45%. -Hold Lasix until able to take PO  Paroxysmal atrial fibrillation -Hold Eliquis, metoprolol until able to take PO --PRNs for BP/HR  -start heparin GTT but can hold if we are graduating diet to orals later on today  CKD III  Baseline Cr 1.2--stable at this time  Diabetes -Hold atorvastatin until able to take PO  COPD No active disease -Can resume Trelegy as well as inhalers  Other medications -Hold allopurinol, Flomax until able to take PO    DVT prophylaxis: SCDs Code Status: FULL Family Communication:  MDM and disposition Plan:  The patient was admitted with SBO.  He will remain NPO and have an NG tube placed for gastrograffin administration.   Objective: Vitals:   05/08/19 1742 05/08/19 1830 05/08/19 1953 05/09/19 0625  BP: (!) 148/90 (!) 158/75 (!) 151/87 128/69  Pulse: 75 70 84 66  Resp: 16 15 16 18   Temp:   98.4 F (36.9 C) 97.7 F (36.5 C)  TempSrc:   Oral   SpO2: 94%  97% 96%  Weight:   85.4 kg   Height:   5' 8.27" (1.734 m)    No intake or output data in the 24 hours ending 05/09/19 0850 Filed Weights   05/08/19 1953  Weight: 85.4 kg    Examination:  Awake alert coherent no distress EOMI NCAT S1-S2 no murmur Abdomen obese nontender midline scar lower quadrant No  lower extremity edema Neurologically intact no focal deficit Chest is clinically clear no added sound no rales no rhonchi  Data Reviewed: I have personally reviewed following labs and imaging studies:  CBC: Recent Labs  Lab 05/08/19 0124 05/09/19 0809  WBC 10.2 8.4  NEUTROABS 8.7* 6.5  HGB 16.5 16.5  HCT 49.9 49.9  MCV 90.7 92.1  PLT 122* 081*   Basic Metabolic Panel: Recent Labs  Lab 05/08/19 0124 05/09/19 0809  NA 137 136  K 3.8 4.2  CL 95* 98  CO2 29 26  GLUCOSE 184* 121*  BUN 26* 20  CREATININE 1.30* 1.23  CALCIUM 9.9 10.1  MG 1.9  --    GFR: Estimated Creatinine Clearance: 56.3 mL/min (by C-G formula based on SCr of 1.23 mg/dL). Liver Function Tests: Recent Labs  Lab 05/08/19 0124 05/09/19 0809  AST 20 18  ALT 20 18  ALKPHOS 109 122  BILITOT 1.4* 2.2*  PROT 7.3 7.5  ALBUMIN 4.4 4.4   Recent Labs  Lab 05/08/19 0124  LIPASE 26   No results for input(s): AMMONIA in the last 168 hours. Coagulation Profile: Recent Labs  Lab 05/09/19 0809  INR 1.0   Cardiac Enzymes: No results for input(s): CKTOTAL, CKMB, CKMBINDEX, TROPONINI in the last 168 hours. BNP (last 3 results) No results for input(s): PROBNP in the last 8760 hours. HbA1C: Recent Labs  05/08/19 0527  HGBA1C 7.0*   CBG: Recent Labs  Lab 05/08/19 1542 05/08/19 2023 05/09/19 0051 05/09/19 0352 05/09/19 0756  GLUCAP 144* 131* 138* 116* 114*   Lipid Profile: No results for input(s): CHOL, HDL, LDLCALC, TRIG, CHOLHDL, LDLDIRECT in the last 72 hours. Thyroid Function Tests: No results for input(s): TSH, T4TOTAL, FREET4, T3FREE, THYROIDAB in the last 72 hours. Anemia Panel: No results for input(s): VITAMINB12, FOLATE, FERRITIN, TIBC, IRON, RETICCTPCT in the last 72 hours. Urine analysis:    Component Value Date/Time   COLORURINE YELLOW 05/08/2019 0124   APPEARANCEUR HAZY (A) 05/08/2019 0124   LABSPEC 1.020 05/08/2019 0124   PHURINE 6.0 05/08/2019 0124   GLUCOSEU NEGATIVE  05/08/2019 0124   HGBUR NEGATIVE 05/08/2019 0124   BILIRUBINUR NEGATIVE 05/08/2019 0124   KETONESUR NEGATIVE 05/08/2019 0124   PROTEINUR 100 (A) 05/08/2019 0124   NITRITE NEGATIVE 05/08/2019 0124   LEUKOCYTESUR NEGATIVE 05/08/2019 0124   Sepsis Labs: @LABRCNTIP (procalcitonin:4,lacticacidven:4)  ) Recent Results (from the past 240 hour(s))  SARS CORONAVIRUS 2 (TAT 6-12 HRS) Nasal Swab Aptima Multi Swab     Status: None   Collection Time: 05/08/19  5:08 AM   Specimen: Aptima Multi Swab; Nasal Swab  Result Value Ref Range Status   SARS Coronavirus 2 NEGATIVE NEGATIVE Final    Comment: (NOTE) SARS-CoV-2 target nucleic acids are NOT DETECTED. The SARS-CoV-2 RNA is generally detectable in upper and lower respiratory specimens during the acute phase of infection. Negative results do not preclude SARS-CoV-2 infection, do not rule out co-infections with other pathogens, and should not be used as the sole basis for treatment or other patient management decisions. Negative results must be combined with clinical observations, patient history, and epidemiological information. The expected result is Negative. Fact Sheet for Patients: SugarRoll.be Fact Sheet for Healthcare Providers: https://www.woods-mathews.com/ This test is not yet approved or cleared by the [place] FDA and  has been authorized for detection and/or diagnosis of SARS-CoV-2 by FDA under an Emergency Use Authorization (EUA). This EUA will remain  in effect (meaning this test can be used) for the duration of the COVID-19 declaration under Section 56 4(b)(1) of the Act, 21 U.S.C. section 360bbb-3(b)(1), unless the authorization is terminated or revoked sooner. Performed at [facility], [place] [address] [place], [place] [address]          Radiology Studies: Ct Abdomen Pelvis W Contrast  Result Date: 05/08/2019 CLINICAL DATA:  73 year old male with abdominal pain  and tenderness. Difficulty with bowel movement. EXAM: CT ABDOMEN AND PELVIS WITH CONTRAST TECHNIQUE: Multidetector CT imaging of the abdomen and pelvis was performed using the standard protocol following bolus administration of intravenous contrast. CONTRAST:  152mL OMNIPAQUE IOHEXOL 300 MG/ML  SOLN COMPARISON:  None. FINDINGS: Lower chest: The visualized lung bases are clear. Multi vessel coronary vascular calcification as well as calcification of the mitral annulus. No intra-abdominal free air. There is trace free fluid within the pelvis. Hepatobiliary: The liver is unremarkable. No intrahepatic biliary ductal dilatation. Apparent mild thickening of the gallbladder wall which may be related to chronic cholecystitis. No calcified stone or pericholecystic fluid. Ultrasound may provide better evaluation if clinically indicated. Pancreas: Unremarkable. No pancreatic ductal dilatation or surrounding inflammatory changes. Spleen: Normal in size without focal abnormality. Adrenals/Urinary Tract: The adrenal glands are unremarkable. There is no hydronephrosis on either side. Bilateral renal cysts measure up to 3 cm in the lower pole of the right kidney. Additional smaller hypodensities are too small to characterize. There is symmetric  enhancement and excretion of contrast by both kidneys. The visualized ureters and urinary bladder are unremarkable. Stomach/Bowel: Postsurgical changes of partial sigmoid resection with anastomotic suture. There is extensive sigmoid and diffuse colonic diverticulosis without active inflammatory changes. Diffusely dilated loops of small bowel throughout the abdomen some of which are fecalized measuring up to 3.6 cm in caliber. There is a transition in the right lower quadrant (series 5, image 66 and coronal series 2 image 67). There is slight swirling of the mesentery at this level which is likely postsurgical changes. Findings most consistent with obstruction likely secondary to adhesion  although an internal hernia is not entirely excluded. There is no pneumatosis. There is a 6.5 cm duodenal diverticulum. Vascular/Lymphatic: Advanced aortoiliac atherosclerotic disease. There is a 2.5 cm infrarenal aortic ectasia. The origins of the celiac axis, SMA, IMA are patent. No portal venous gas. There is no adenopathy. Reproductive: Enlarged prostate gland measuring 6.5 cm in transverse axial diameter. Other: None Musculoskeletal: Degenerative changes of the spine. No acute osseous pathology. IMPRESSION: 1. Small-bowel obstruction with transition zone in the right lower quadrant likely secondary to adhesion although an internal hernia is not entirely excluded. No pneumatosis or portal venous gas. 2. Extensive colonic diverticulosis. 3. Aortic Atherosclerosis (ICD10-I70.0). Electronically Signed   By: Anner Crete M.D.   On: 05/08/2019 03:38        Scheduled Meds: . diatrizoate meglumine-sodium  90 mL Per NG tube Once  . heparin  4,000 Units Intravenous Once  . insulin aspart  0-9 Units Subcutaneous Q4H   Continuous Infusions: . heparin       LOS: 1 day    Time spent: 15 minutes    Nita Sells, MD [facility] 05/09/2019, 8:50 AM

## 2019-05-10 LAB — COMPREHENSIVE METABOLIC PANEL
ALT: 17 U/L (ref 0–44)
AST: 18 U/L (ref 15–41)
Albumin: 4.1 g/dL (ref 3.5–5.0)
Alkaline Phosphatase: 107 U/L (ref 38–126)
Anion gap: 15 (ref 5–15)
BUN: 22 mg/dL (ref 8–23)
CO2: 26 mmol/L (ref 22–32)
Calcium: 9.6 mg/dL (ref 8.9–10.3)
Chloride: 100 mmol/L (ref 98–111)
Creatinine, Ser: 1.28 mg/dL — ABNORMAL HIGH (ref 0.61–1.24)
GFR calc Af Amer: >60 mL/min (ref >60)
GFR calc non Af Amer: 55 mL/min — ABNORMAL LOW (ref >60)
Glucose, Bld: 117 mg/dL — ABNORMAL HIGH (ref 70–99)
Potassium: 3.9 mmol/L (ref 3.5–5.1)
Sodium: 141 mmol/L (ref 135–145)
Total Bilirubin: 1.9 mg/dL — ABNORMAL HIGH (ref 0.3–1.2)
Total Protein: 6.7 g/dL (ref 6.5–8.1)

## 2019-05-10 LAB — GLUCOSE, CAPILLARY
Glucose-Capillary: 106 mg/dL — ABNORMAL HIGH (ref 70–99)
Glucose-Capillary: 110 mg/dL — ABNORMAL HIGH (ref 70–99)
Glucose-Capillary: 112 mg/dL — ABNORMAL HIGH (ref 70–99)
Glucose-Capillary: 118 mg/dL — ABNORMAL HIGH (ref 70–99)
Glucose-Capillary: 119 mg/dL — ABNORMAL HIGH (ref 70–99)
Glucose-Capillary: 141 mg/dL — ABNORMAL HIGH (ref 70–99)
Glucose-Capillary: 149 mg/dL — ABNORMAL HIGH (ref 70–99)

## 2019-05-10 LAB — CBC
HCT: 47.3 % (ref 39.0–52.0)
Hemoglobin: 15.4 g/dL (ref 13.0–17.0)
MCH: 29.9 pg (ref 26.0–34.0)
MCHC: 32.6 g/dL (ref 30.0–36.0)
MCV: 91.8 fL (ref 80.0–100.0)
Platelets: 107 10*3/uL — ABNORMAL LOW (ref 150–400)
RBC: 5.15 MIL/uL (ref 4.22–5.81)
RDW: 15 % (ref 11.5–15.5)
WBC: 7.1 10*3/uL (ref 4.0–10.5)
nRBC: 0 % (ref 0.0–0.2)

## 2019-05-10 LAB — APTT
aPTT: 68 seconds — ABNORMAL HIGH (ref 24–36)
aPTT: 62 seconds — ABNORMAL HIGH (ref 24–36)

## 2019-05-10 LAB — HEPARIN LEVEL (UNFRACTIONATED): Heparin Unfractionated: 0.87 IU/mL — ABNORMAL HIGH (ref 0.30–0.70)

## 2019-05-10 MED ORDER — HEPARIN (PORCINE) 25000 UT/250ML-% IV SOLN
1450.0000 [IU]/h | INTRAVENOUS | Status: DC
  Administered 2019-05-10: 1450 [IU]/h via INTRAVENOUS
  Filled 2019-05-10 (×2): qty 250

## 2019-05-10 NOTE — Progress Notes (Signed)
    ZO:[id number] pain  Subjective: Still a bit distended and some discomfort, but much better.  Passing gas and a lot of BM per pt.  He has been up walking also.    Objective: Vital signs in last 24 hours: Temp:  [97.5 F (36.4 C)-98.2 F (36.8 C)] 97.5 F (36.4 C) (08/27 0607) Pulse Rate:  [71-72] 71 (08/27 0607) Resp:  [17-18] 18 (08/27 0607) BP: (116-153)/(81-98) 153/81 (08/27 0607) SpO2:  [94 %-96 %] 94 % (08/27 0607) Last BM Date: 05/09/19 250 IV BM x 2 recorded NPO Afebrile, VSS No labs SBP shows ongoing SB dilatation, with contrast in the colon Intake/Output from previous day: 08/26 0701 - 08/27 0700 In: 250.5 [I.V.:250.5] Out: -  Intake/Output this shift: No intake/output data recorded.  General appearance: alert, cooperative and no distress GI: soft, still distended some and some discomfort, + BM's  Lab Results:  Recent Labs    05/09/19 0809 05/10/19 0226  WBC 8.4 7.1  HGB 16.5 15.4  HCT 49.9 47.3  PLT 126* 107*    BMET Recent Labs    05/08/19 0124 05/09/19 0809  NA 137 136  K 3.8 4.2  CL 95* 98  CO2 29 26  GLUCOSE 184* 121*  BUN 26* 20  CREATININE 1.30* 1.23  CALCIUM 9.9 10.1   PT/INR Recent Labs    05/09/19 0809  LABPROT 13.1  INR 1.0    Recent Labs  Lab 05/08/19 0124 05/09/19 0809  AST 20 18  ALT 20 18  ALKPHOS 109 122  BILITOT 1.4* 2.2*  PROT 7.3 7.5  ALBUMIN 4.4 4.4     Lipase     Component Value Date/Time   LIPASE 26 05/08/2019 0124     Medications: . insulin aspart  0-9 Units Subcutaneous Q4H   . heparin 1,300 Units/hr (05/10/19 9811)    Assessment/Plan Remote hx of Lung Cancer CADs/p PCI (2003) A. fib onEliquis (last dose last night) COPD DM2- A1c 7.0 HTN HLD Hx of anemia   SBO - Hx of Ex Lap reportedly in 1992 in [place] for areportedperforated bowel - Likely from adhesions. There was questionable internal hernia on CT. Exam without peritonitis. No indication for emergent surgery -  SBO protocol - Mobilize for bowel function - Keep K > 4 and Mg < 2 for bowel function - We will continue to follow - small bowel protocol with contrast in colon PM 8/26  ID -None VTE -SCDs, heparin drip FEN -NPO, IVF, K 3.8, Mg 1.9 Foley -None Follow up -TBD   Plan:  Clear liquids and if he does well advance diet tomorrow.      LOS: 2 days    Moses Odoherty 05/10/2019 [phone]

## 2019-05-10 NOTE — Telephone Encounter (Signed)
Pt is currently still admitted.

## 2019-05-10 NOTE — Progress Notes (Signed)
ANTICOAGULATION CONSULT NOTE   Pharmacy Consult for Heparin Indication: atrial fibrillation  No Known Allergies  Patient Measurements: Height: 5' 8.27" (173.4 cm) Weight: 188 lb 4.4 oz (85.4 kg) IBW/kg (Calculated) : 69.02 Heparin Dosing Weight: 85.4  Vital Signs: Temp: 97.5 F (36.4 C) (08/27 0607) Temp Source: Oral (08/27 0607) BP: 153/81 (08/27 0607) Pulse Rate: 71 (08/27 0607)  Labs: Recent Labs    05/08/19 0124 05/09/19 0809 05/09/19 2020 05/10/19 0224 05/10/19 0226 05/10/19 0758  HGB 16.5 16.5  --   --  15.4  --   HCT 49.9 49.9  --   --  47.3  --   PLT 122* 126*  --   --  107*  --   APTT  --  32 63*  --   --  68*  LABPROT  --  13.1  --   --   --   --   INR  --  1.0  --   --   --   --   HEPARINUNFRC  --  1.22*  --   --   --  0.87*  CREATININE 1.30* 1.23  --  1.28*  --   --     Estimated Creatinine Clearance: 54.1 mL/min (A) (by C-G formula based on SCr of 1.28 mg/dL (H)).   Medications:  Medications Prior to Admission  Medication Sig Dispense Refill Last Dose  . albuterol (PROVENTIL) (2.5 MG/3ML) 0.083% nebulizer solution Take 3 mLs (2.5 mg total) by nebulization every 6 (six) hours as needed for up to 4 doses for wheezing or shortness of breath. 1080 mL 0 Past Week at Unknown time  . albuterol (VENTOLIN HFA) 108 (90 Base) MCG/ACT inhaler Use 2 puffs every 4-6 hours as needed for wheezing or shortness of breath 24 g 3 05/07/2019 at Unknown time  . allopurinol (ZYLOPRIM) 300 MG tablet Take 300 mg by mouth daily.   05/07/2019 at Unknown time  . apixaban (ELIQUIS) 5 MG TABS tablet Take 1 tablet (5 mg total) by mouth 2 (two) times daily. 180 tablet 1 05/07/2019 at 930pm  . atorvastatin (LIPITOR) 40 MG tablet Take 40 mg by mouth daily.   05/07/2019 at Unknown time  . dicyclomine (BENTYL) 10 MG capsule Take 1 capsule by mouth every 6 (six) hours as needed for spasms.    05/07/2019 at Unknown time  . Fluticasone-Umeclidin-Vilant (TRELEGY ELLIPTA) 100-62.5-25 MCG/INH AEPB  Inhale 1 puff into the lungs daily. 3 each 3 05/07/2019 at Unknown time  . furosemide (LASIX) 20 MG tablet Take 20 mg (1 Tab) every other day 90 tablet 1 05/06/2019  . ipratropium (ATROVENT) 0.03 % nasal spray Place 2 sprays into both nostrils every 12 (twelve) hours as needed for rhinitis.    05/07/2019 at Unknown time  . metoprolol succinate (TOPROL-XL) 25 MG 24 hr tablet Take 25 mg by mouth daily.   05/07/2019 at 0900  . nitroGLYCERIN (NITROSTAT) 0.4 MG SL tablet Place 0.4 mg under the tongue every 5 (five) minutes as needed for chest pain.   never used  . omeprazole (PRILOSEC) 40 MG capsule Take 40 mg by mouth 2 (two) times daily.   05/07/2019 at Unknown time  . tamsulosin (FLOMAX) 0.4 MG CAPS capsule Take 0.4 mg by mouth daily after supper.    05/07/2019 at Unknown time  . traMADol (ULTRAM) 50 MG tablet Take 50 mg by mouth every 6 (six) hours as needed for moderate pain.    05/07/2019 at Unknown time  . traZODone (DESYREL) 100 MG  tablet Take 100 mg by mouth at bedtime.   05/07/2019 at Unknown time    Assessment: 54 YOM with a PMH of Afib, on Eliquis, chronic constipation and small bowel obstruction. Presented to the ED on 05/07/49 with c/o abdominal pain and distension. Eliquis currently on hold, as pt is NPO. Pharmacy to dose heparin.   Prior Anticoagulation: Eliquis 5 mg BID, LD 05/07/19 @ 2130  Basline: INR normal, HL supratherapeutic, aPTT normal  Today, 05/09/19:  - CBC wnl, platelets trending down  - HL still supratherapeutic, but trending down  - aPTT level therapeutic, although at lower end of therapeutic range - No s/sx of bleeding or infusion issue reported, per RN  - No indication for surgery; on clear liquids   Goal of Therapy:  Heparin level 0.3-0.7 units/ml aPTT 66-102 seconds Monitor platelets by anticoagulation protocol: Yes   Plan:  - Increase heparin infusion rate to 1,350 units/hr  - Check confirmatory aPTT in 8 hours. Using aPTT to monitor for now due to elevated HL from  DOAC  - Daily CBC, HL - Monitor for s/sx of bleeding - Follow for possible surgical procedure   Jonathon Bellows, PharmD Candidate 05/10/2019 2:59 PM

## 2019-05-10 NOTE — Progress Notes (Signed)
Brief Pharmacy Note re: IV heparin  Heparin for Afib bridging while off Eliquis  O: aPTT 62sec on 1350 units/hr (goal 66-102 sec)      No bleeding or infusion related issues reported by RN  A/P:  aPTT now sub-therapeutic.  Increase heparin 1450 units/hr.          Recheck aPTT with morning labs  Netta Cedars, PharmD, BCPS 05/10/2019@7 :23 PM

## 2019-05-10 NOTE — Telephone Encounter (Signed)
Patient will call us back after he is discharged from the hospital and feeling up to a PFT.  Will close encounter.

## 2019-05-10 NOTE — Progress Notes (Signed)
PROGRESS NOTE    Juan Valdez  AGT:[id number] DOB: 12-12-1945 DOA: 05/08/2019 PCP: Ronita Hipps, MD   Brief Narrative:   73 y.o. M with Afib on Eliquis, CAD s/p PCI in 2003, COPD not on O2, DM, NSCLC stage I s/p resection in 2014, HTN and hx partial SBO who presented with abdominal pain and distension.  In the ER, CT showed a small bowel obstruction.-Had NG tube apparently earlier  Assessment & Plan:  SBO CT without pneumatosis or other high grade features. -Abdominal x-rays personally reviewed showed gas in large bowel-passing multiple stools today and has been graduated to liquids -deferring to General Surgery, appreciate cares  Hypertension CAD Reduced EF without history CHF Echo this year showed EF 40-45%. -Hold Lasix until able to take PO  Paroxysmal atrial fibrillation -Hold Eliquis, metoprolol until able to take PO --PRNs for BP/HR  -Continue heparin drip transition to Eliquis once reliably taking p.o.  CKD III  Baseline Cr 1.2--stable at this time-follow labs from later on today  Diabetes -Hold atorvastatin until able to take PO  COPD No active disease -Can resume Trelegy as well as inhalers -Placing order for as needed albuterol every 6 nebs  Other medications -Hold allopurinol, Flomax until able to take PO    DVT prophylaxis: SCDs Code Status: FULL Family Communication:  MDM and disposition Plan:  The patient was admitted with SBO.  Patient is on fluids and may be discharged in the next 24 to 48 hours if continues to do well   Objective: Vitals:   05/09/19 0625 05/09/19 1421 05/09/19 2127 05/10/19 0607  BP: 128/69 (!) 116/98 (!) 147/87 (!) 153/81  Pulse: 66 71 72 71  Resp: 18 17 18 18   Temp: 97.7 F (36.5 C) 98.2 F (36.8 C) 98 F (36.7 C) (!) 97.5 F (36.4 C)  TempSrc:    Oral  SpO2: 96% 95% 96% 94%  Weight:      Height:        Intake/Output Summary (Last 24 hours) at 05/10/2019 1047 Last data filed at 05/10/2019 1000 Gross per 24  hour  Intake 250.47 ml  Output -  Net 250.47 ml   Filed Weights   05/08/19 1953  Weight: 85.4 kg    Examination:  Coherent ambulatory no distress Chest clear S1-S2 no murmur sinus rhythm on exam Abdomen obese hernia No lower extremity edema  Data Reviewed: I have personally reviewed following labs and imaging studies:  CBC: Recent Labs  Lab 05/08/19 0124 05/09/19 0809 05/10/19 0226  WBC 10.2 8.4 7.1  NEUTROABS 8.7* 6.5  --   HGB 16.5 16.5 15.4  HCT 49.9 49.9 47.3  MCV 90.7 92.1 91.8  PLT 122* 126* 224*   Basic Metabolic Panel: Recent Labs  Lab 05/08/19 0124 05/09/19 0809 05/10/19 0224  NA 137 136 141  K 3.8 4.2 3.9  CL 95* 98 100  CO2 29 26 26   GLUCOSE 184* 121* 117*  BUN 26* 20 22  CREATININE 1.30* 1.23 1.28*  CALCIUM 9.9 10.1 9.6  MG 1.9  --   --    GFR: Estimated Creatinine Clearance: 54.1 mL/min (A) (by C-G formula based on SCr of 1.28 mg/dL (H)). Liver Function Tests: Recent Labs  Lab 05/08/19 0124 05/09/19 0809 05/10/19 0224  AST 20 18 18   ALT 20 18 17   ALKPHOS 109 122 107  BILITOT 1.4* 2.2* 1.9*  PROT 7.3 7.5 6.7  ALBUMIN 4.4 4.4 4.1   Recent Labs  Lab 05/08/19 0124  LIPASE  26   No results for input(s): AMMONIA in the last 168 hours. Coagulation Profile: Recent Labs  Lab 05/09/19 0809  INR 1.0   Cardiac Enzymes: No results for input(s): CKTOTAL, CKMB, CKMBINDEX, TROPONINI in the last 168 hours. BNP (last 3 results) No results for input(s): PROBNP in the last 8760 hours. HbA1C: Recent Labs    05/08/19 0527  HGBA1C 7.0*   CBG: Recent Labs  Lab 05/09/19 1623 05/09/19 2023 05/10/19 0003 05/10/19 0357 05/10/19 0739  GLUCAP 102* 117* 141* 112* 106*   Lipid Profile: No results for input(s): CHOL, HDL, LDLCALC, TRIG, CHOLHDL, LDLDIRECT in the last 72 hours. Thyroid Function Tests: No results for input(s): TSH, T4TOTAL, FREET4, T3FREE, THYROIDAB in the last 72 hours. Anemia Panel: No results for input(s): VITAMINB12,  FOLATE, FERRITIN, TIBC, IRON, RETICCTPCT in the last 72 hours. Urine analysis:    Component Value Date/Time   COLORURINE YELLOW 05/08/2019 0124   APPEARANCEUR HAZY (A) 05/08/2019 0124   LABSPEC 1.020 05/08/2019 0124   PHURINE 6.0 05/08/2019 0124   GLUCOSEU NEGATIVE 05/08/2019 0124   HGBUR NEGATIVE 05/08/2019 0124   BILIRUBINUR NEGATIVE 05/08/2019 0124   KETONESUR NEGATIVE 05/08/2019 0124   PROTEINUR 100 (A) 05/08/2019 0124   NITRITE NEGATIVE 05/08/2019 0124   LEUKOCYTESUR NEGATIVE 05/08/2019 0124   Sepsis Labs: @LABRCNTIP (procalcitonin:4,lacticacidven:4)  ) Recent Results (from the past 240 hour(s))  SARS CORONAVIRUS 2 (TAT 6-12 HRS) Nasal Swab Aptima Multi Swab     Status: None   Collection Time: 05/08/19  5:08 AM   Specimen: Aptima Multi Swab; Nasal Swab  Result Value Ref Range Status   SARS Coronavirus 2 NEGATIVE NEGATIVE Final    Comment: (NOTE) SARS-CoV-2 target nucleic acids are NOT DETECTED. The SARS-CoV-2 RNA is generally detectable in upper and lower respiratory specimens during the acute phase of infection. Negative results do not preclude SARS-CoV-2 infection, do not rule out co-infections with other pathogens, and should not be used as the sole basis for treatment or other patient management decisions. Negative results must be combined with clinical observations, patient history, and epidemiological information. The expected result is Negative. Fact Sheet for Patients: SugarRoll.be Fact Sheet for Healthcare Providers: https://www.woods-mathews.com/ This test is not yet approved or cleared by the [place] FDA and  has been authorized for detection and/or diagnosis of SARS-CoV-2 by FDA under an Emergency Use Authorization (EUA). This EUA will remain  in effect (meaning this test can be used) for the duration of the COVID-19 declaration under Section 56 4(b)(1) of the Act, 21 U.S.C. section 360bbb-3(b)(1), unless the  authorization is terminated or revoked sooner. Performed at [facility], [address], [place], [place] [address]       Radiology Studies: Dg Abd 1 View  Result Date: 05/09/2019 CLINICAL DATA:  Follow up SBO EXAM: ABDOMEN - 1 VIEW COMPARISON:  CT abdomen pelvis 05/08/2019 FINDINGS: There are several dilated loops of small bowel in the right and central abdomen consistent with small bowel obstruction seen on prior CT. A loop in the right hemiabdomen measures up to 5.4 cm in diameter. Gas is seen to the level of the rectum. No supine evidence for free air. No acute osseous abnormality in the visualized bones. IMPRESSION: Persistent dilated loops of small bowel consistent with small bowel obstruction. Electronically Signed   By: Audie Pinto M.D.   On: 05/09/2019 10:46   Dg Abd Portable 1v-small Bowel Obstruction Protocol-initial, 8 Hr Delay  Result Date: 05/09/2019 CLINICAL DATA:  Small bowel protocol 8 hour  delay EXAM: PORTABLE ABDOMEN - 1 VIEW COMPARISON:  05/09/2019, CT 05/08/2019 FINDINGS: Persistent dilatation of central small bowel loops measuring up to 5 cm in diameter. Contrast material is present within the colon and rectum. IMPRESSION: Persistent dilatation of central small bowel, suggesting obstruction, however there is contrast present within the colon and rectum. Electronically Signed   By: Donavan Foil M.D.   On: 05/09/2019 19:53    Scheduled Meds: . insulin aspart  0-9 Units Subcutaneous Q4H   Continuous Infusions: . heparin 1,350 Units/hr (05/10/19 1019)     LOS: 2 days    Time spent: 15 minutes    Nita Sells, MD [facility] 05/10/2019, 10:47 AM

## 2019-05-11 LAB — COMPREHENSIVE METABOLIC PANEL
ALT: 18 U/L (ref 0–44)
AST: 25 U/L (ref 15–41)
Albumin: 4.1 g/dL (ref 3.5–5.0)
Alkaline Phosphatase: 110 U/L (ref 38–126)
Anion gap: 12 (ref 5–15)
BUN: 18 mg/dL (ref 8–23)
CO2: 24 mmol/L (ref 22–32)
Calcium: 9.3 mg/dL (ref 8.9–10.3)
Chloride: 103 mmol/L (ref 98–111)
Creatinine, Ser: 1.12 mg/dL (ref 0.61–1.24)
GFR calc Af Amer: >60 mL/min (ref >60)
GFR calc non Af Amer: >60 mL/min (ref >60)
Glucose, Bld: 132 mg/dL — ABNORMAL HIGH (ref 70–99)
Potassium: 3.6 mmol/L (ref 3.5–5.1)
Sodium: 139 mmol/L (ref 135–145)
Total Bilirubin: 1.7 mg/dL — ABNORMAL HIGH (ref 0.3–1.2)
Total Protein: 6.8 g/dL (ref 6.5–8.1)

## 2019-05-11 LAB — MAGNESIUM: Magnesium: 1.9 mg/dL (ref 1.7–2.4)

## 2019-05-11 LAB — HEPARIN LEVEL (UNFRACTIONATED): Heparin Unfractionated: 0.52 IU/mL (ref 0.30–0.70)

## 2019-05-11 LAB — CBC
HCT: 46 % (ref 39.0–52.0)
Hemoglobin: 15.2 g/dL (ref 13.0–17.0)
MCH: 30 pg (ref 26.0–34.0)
MCHC: 33 g/dL (ref 30.0–36.0)
MCV: 90.7 fL (ref 80.0–100.0)
Platelets: 125 10*3/uL — ABNORMAL LOW (ref 150–400)
RBC: 5.07 MIL/uL (ref 4.22–5.81)
RDW: 15 % (ref 11.5–15.5)
WBC: 6.9 10*3/uL (ref 4.0–10.5)
nRBC: 0 % (ref 0.0–0.2)

## 2019-05-11 LAB — GLUCOSE, CAPILLARY
Glucose-Capillary: 143 mg/dL — ABNORMAL HIGH (ref 70–99)
Glucose-Capillary: 117 mg/dL — ABNORMAL HIGH (ref 70–99)
Glucose-Capillary: 92 mg/dL (ref 70–99)

## 2019-05-11 LAB — APTT: aPTT: 71 seconds — ABNORMAL HIGH (ref 24–36)

## 2019-05-11 MED ORDER — FUROSEMIDE 20 MG PO TABS
20.0000 mg | ORAL_TABLET | Freq: Every day | ORAL | Status: DC
  Administered 2019-05-11: 12:00:00 10 mg via ORAL
  Filled 2019-05-11: qty 1

## 2019-05-11 MED ORDER — APIXABAN 5 MG PO TABS
5.0000 mg | ORAL_TABLET | Freq: Two times a day (BID) | ORAL | Status: DC
  Administered 2019-05-11: 5 mg via ORAL
  Filled 2019-05-11: qty 1

## 2019-05-11 MED ORDER — TAMSULOSIN HCL 0.4 MG PO CAPS: 0.4000 mg | ORAL_CAPSULE | Freq: Every day | ORAL | Status: DC

## 2019-05-11 MED ORDER — METOPROLOL SUCCINATE ER 25 MG PO TB24
25.0000 mg | ORAL_TABLET | Freq: Every day | ORAL | Status: DC
  Administered 2019-05-11: 25 mg via ORAL
  Filled 2019-05-11: qty 1

## 2019-05-11 MED ORDER — TRAZODONE HCL 100 MG PO TABS: 100.0000 mg | ORAL_TABLET | Freq: Every day | ORAL | Status: DC

## 2019-05-11 NOTE — Progress Notes (Signed)
   Subjective/Chief Complaint: No complaints   Objective: Vital signs in last 24 hours: Temp:  [97.5 F (36.4 C)-98.1 F (36.7 C)] 98.1 F (36.7 C) (08/28 0359) Pulse Rate:  [69-73] 69 (08/28 0359) Resp:  [18] 18 (08/28 0359) BP: (156-165)/(83-126) 156/126 (08/28 0359) SpO2:  [94 %-98 %] 95 % (08/28 0359) Last BM Date: 05/10/19  Intake/Output from previous day: 08/27 0701 - 08/28 0700 In: 276.1 [I.V.:276.1] Out: -  Intake/Output this shift: No intake/output data recorded.  General appearance: alert and cooperative Resp: clear to auscultation bilaterally Cardio: regular rate and rhythm GI: soft, nontender. not distended. having bm's  Lab Results:  Recent Labs    05/10/19 0226 05/11/19 0417  WBC 7.1 6.9  HGB 15.4 15.2  HCT 47.3 46.0  PLT 107* 125*   BMET Recent Labs    05/10/19 0224 05/11/19 0417  NA 141 139  K 3.9 3.6  CL 100 103  CO2 26 24  GLUCOSE 117* 132*  BUN 22 18  CREATININE 1.28* 1.12  CALCIUM 9.6 9.3   PT/INR Recent Labs    05/09/19 0809  LABPROT 13.1  INR 1.0   ABG No results for input(s): PHART, HCO3 in the last 72 hours.  Invalid input(s): PCO2, PO2  Studies/Results: Dg Abd 1 View  Result Date: 05/09/2019 CLINICAL DATA:  Follow up SBO EXAM: ABDOMEN - 1 VIEW COMPARISON:  CT abdomen pelvis 05/08/2019 FINDINGS: There are several dilated loops of small bowel in the right and central abdomen consistent with small bowel obstruction seen on prior CT. A loop in the right hemiabdomen measures up to 5.4 cm in diameter. Gas is seen to the level of the rectum. No supine evidence for free air. No acute osseous abnormality in the visualized bones. IMPRESSION: Persistent dilated loops of small bowel consistent with small bowel obstruction. Electronically Signed   By: Audie Pinto M.D.   On: 05/09/2019 10:46   Dg Abd Portable 1v-small Bowel Obstruction Protocol-initial, 8 Hr Delay  Result Date: 05/09/2019 CLINICAL DATA:  Small bowel protocol 8  hour delay EXAM: PORTABLE ABDOMEN - 1 VIEW COMPARISON:  05/09/2019, CT 05/08/2019 FINDINGS: Persistent dilatation of central small bowel loops measuring up to 5 cm in diameter. Contrast material is present within the colon and rectum. IMPRESSION: Persistent dilatation of central small bowel, suggesting obstruction, however there is contrast present within the colon and rectum. Electronically Signed   By: Donavan Foil M.D.   On: 05/09/2019 19:53    Anti-infectives: Anti-infectives (From admission, onward)   None      Assessment/Plan: s/p * No surgery found * sbo seems to be resolving  Advance diet Hopefully home soon Will sign off  LOS: 3 days    Juan Valdez 05/11/2019

## 2019-05-11 NOTE — Care Management Important Message (Signed)
Important Message  Patient Details IM Letter given to Velva Harman RN to present to the patient Name: Juan Valdez MRN: [id number] Date of Birth: [birth year]   Medicare Important Message Given:  Yes     Kerin Salen 05/11/2019, 12:20 PM

## 2019-05-11 NOTE — Progress Notes (Signed)
ANTICOAGULATION CONSULT NOTE - Follow Up Consult  Pharmacy Consult for Heparin Indication: atrial fibrillation  No Known Allergies  Patient Measurements: Height: 5' 8.27" (173.4 cm) Weight: 188 lb 4.4 oz (85.4 kg) IBW/kg (Calculated) : 69.02 Heparin Dosing Weight:   Vital Signs: Temp: 98.1 F (36.7 C) (08/28 0359) Temp Source: Oral (08/28 0359) BP: 156/126 (08/28 0359) Pulse Rate: 69 (08/28 0359)  Labs: Recent Labs    05/09/19 0809  05/10/19 0224 05/10/19 0226 05/10/19 0758 05/10/19 1803 05/11/19 0417  HGB 16.5  --   --  15.4  --   --  15.2  HCT 49.9  --   --  47.3  --   --  46.0  PLT 126*  --   --  107*  --   --  125*  APTT 32   < >  --   --  68* 62* 71*  LABPROT 13.1  --   --   --   --   --   --   INR 1.0  --   --   --   --   --   --   HEPARINUNFRC 1.22*  --   --   --  0.87*  --  0.52  CREATININE 1.23  --  1.28*  --   --   --  1.12   < > = values in this interval not displayed.    Estimated Creatinine Clearance: 61.9 mL/min (by C-G formula based on SCr of 1.12 mg/dL).   Medications:  Infusions:  . heparin 1,450 Units/hr (05/10/19 2339)    Assessment: Patient with heparin level and PTT at goal.  Will just use heparin level going forward.  Goal of Therapy:  Heparin level 0.3-0.7 units/ml Monitor platelets by anticoagulation protocol: Yes   Plan:  Continue heparin drip at current rate Recheck level at [place], [place] 05/11/2019,5:58 AM

## 2019-05-11 NOTE — Progress Notes (Signed)
Patient discharged to home w/ wife. Given all belongings, instructions. Patient and wife verbalized understanding. Patient tolerating soft and heart healthy diet, no pain or nausea. Ambulating frequently. Escorted to pov via w/c.

## 2019-05-11 NOTE — Discharge Summary (Signed)
Physician Discharge Summary  Juan Valdez GUR:[id number] DOB: 08-17-1946 DOA: 05/08/2019  PCP: Ronita Hipps, MD  Admit date: 05/08/2019 Discharge date: 05/11/2019  Time spent: 25 minutes  Recommendations for Outpatient Follow-up:  1. Recommend stopping Bentyl, tramadol 2. Needs Chem-12 in about 1 week  Discharge Diagnoses:  Principal Problem:   SBO (small bowel obstruction) (HCC) Active Problems:   Atrial fibrillation (HCC)   Diabetes mellitus due to underlying condition with unspecified complications ([place])   COPD (chronic obstructive pulmonary disease) ([place])   Discharge Condition: Improved  Diet recommendation: Soft  Filed Weights   05/08/19 1953  Weight: 85.4 kg    History of present illness:   73 y.o. M with Afib on Eliquis, CAD s/p PCI in 2003, COPD not on O2, DM, NSCLC stage I s/p resection in 2014, HTN and hx partial SBO who presented with abdominal pain and distension.  In the ER, CT showed a small bowel obstruction.-Had NG tube apparently earlier   SBO CT without pneumatosis or other high grade features. -Abdominal x-rays personally reviewed showed improvement by discharge -Small bowel protocol performed-patient passing multiple liquid stools-graduated to full diet on discharge tolerated to full meals prior to the same  Hypertension CAD Reduced EF without history CHF Echo this year showed EF 40-45%. -Hold Lasix until able to take PO-resume every other day dosing on discharge  Paroxysmal atrial fibrillation -Hold Eliquis, metoprolol until able to take PO-resumed on discharge --PRNs for BP/HR   CKD III  Baseline Cr 1.2--stable during hospital stay  Diabetes -Hold atorvastatin until able to take PO -Does not appear to be on home diabetes medications likely will need outpatient A1c and follow-up  COPD No active disease -Can resume Trelegy as well as inhalers -Placing order for as needed albuterol every 6 nebs  Other medications -Hold  allopurinol, Flomax until able to take PO  Consultations:  General surgery  Discharge Exam: Vitals:   05/11/19 0123 05/11/19 0359  BP:  (!) 156/126  Pulse:  69  Resp:  18  Temp:  98.1 F (36.7 C)  SpO2: 98% 95%    General: Awake coherent pleasant no distress eating a full meal at the bedside does not like it but able to tolerate it without nausea vomiting 3 stools today Cardiovascular: S1-S2 no murmur rub or gallop Respiratory: Clinically clear no rales no rhonchi Abdomen soft no rebound no guarding although distended and does have hernia No lower extremity edema  Discharge Instructions   Discharge Instructions    Diet - low sodium heart healthy   Complete by: As directed    Discharge instructions   Complete by: As directed    Notice that some of your medications have changed and I have discontinued some there are no new prescription so you do not need any refills nor to go to your pharmacy Please follow-up with your regular physician Please do not use pain meds other than Tylenol as tramadol and other medications that are like opiates can sometimes cause you to have slowing of your gut and put you in this predicament Get labs in 1 week at your primary physician's office   Increase activity slowly   Complete by: As directed      Allergies as of 05/11/2019   No Known Allergies     Medication List    STOP taking these medications   dicyclomine 10 MG capsule Commonly known as: BENTYL   traMADol 50 MG tablet Commonly known as: ULTRAM     TAKE  these medications   albuterol (2.5 MG/3ML) 0.083% nebulizer solution Commonly known as: PROVENTIL Take 3 mLs (2.5 mg total) by nebulization every 6 (six) hours as needed for up to 4 doses for wheezing or shortness of breath.   albuterol 108 (90 Base) MCG/ACT inhaler Commonly known as: Ventolin HFA Use 2 puffs every 4-6 hours as needed for wheezing or shortness of breath   allopurinol 300 MG tablet Commonly known as:  ZYLOPRIM Take 300 mg by mouth daily.   apixaban 5 MG Tabs tablet Commonly known as: Eliquis Take 1 tablet (5 mg total) by mouth 2 (two) times daily.   atorvastatin 40 MG tablet Commonly known as: LIPITOR Take 40 mg by mouth daily.   furosemide 20 MG tablet Commonly known as: Lasix Take 20 mg (1 Tab) every other day   ipratropium 0.03 % nasal spray Commonly known as: ATROVENT Place 2 sprays into both nostrils every 12 (twelve) hours as needed for rhinitis.   metoprolol succinate 25 MG 24 hr tablet Commonly known as: TOPROL-XL Take 25 mg by mouth daily.   nitroGLYCERIN 0.4 MG SL tablet Commonly known as: NITROSTAT Place 0.4 mg under the tongue every 5 (five) minutes as needed for chest pain.   omeprazole 40 MG capsule Commonly known as: PRILOSEC Take 40 mg by mouth 2 (two) times daily.   tamsulosin 0.4 MG Caps capsule Commonly known as: FLOMAX Take 0.4 mg by mouth daily after supper.   traZODone 100 MG tablet Commonly known as: DESYREL Take 100 mg by mouth at bedtime.   Trelegy Ellipta 100-62.5-25 MCG/INH Aepb Generic drug: Fluticasone-Umeclidin-Vilant Inhale 1 puff into the lungs daily.      No Known Allergies    The results of significant diagnostics from this hospitalization (including imaging, microbiology, ancillary and laboratory) are listed below for reference.    Significant Diagnostic Studies: Dg Abd 1 View  Result Date: 05/09/2019 CLINICAL DATA:  Follow up SBO EXAM: ABDOMEN - 1 VIEW COMPARISON:  CT abdomen pelvis 05/08/2019 FINDINGS: There are several dilated loops of small bowel in the right and central abdomen consistent with small bowel obstruction seen on prior CT. A loop in the right hemiabdomen measures up to 5.4 cm in diameter. Gas is seen to the level of the rectum. No supine evidence for free air. No acute osseous abnormality in the visualized bones. IMPRESSION: Persistent dilated loops of small bowel consistent with small bowel obstruction.  Electronically Signed   By: Audie Pinto M.D.   On: 05/09/2019 10:46   Ct Abdomen Pelvis W Contrast  Result Date: 05/08/2019 CLINICAL DATA:  73 year old male with abdominal pain and tenderness. Difficulty with bowel movement. EXAM: CT ABDOMEN AND PELVIS WITH CONTRAST TECHNIQUE: Multidetector CT imaging of the abdomen and pelvis was performed using the standard protocol following bolus administration of intravenous contrast. CONTRAST:  168mL OMNIPAQUE IOHEXOL 300 MG/ML  SOLN COMPARISON:  None. FINDINGS: Lower chest: The visualized lung bases are clear. Multi vessel coronary vascular calcification as well as calcification of the mitral annulus. No intra-abdominal free air. There is trace free fluid within the pelvis. Hepatobiliary: The liver is unremarkable. No intrahepatic biliary ductal dilatation. Apparent mild thickening of the gallbladder wall which may be related to chronic cholecystitis. No calcified stone or pericholecystic fluid. Ultrasound may provide better evaluation if clinically indicated. Pancreas: Unremarkable. No pancreatic ductal dilatation or surrounding inflammatory changes. Spleen: Normal in size without focal abnormality. Adrenals/Urinary Tract: The adrenal glands are unremarkable. There is no hydronephrosis on either side. Bilateral renal  cysts measure up to 3 cm in the lower pole of the right kidney. Additional smaller hypodensities are too small to characterize. There is symmetric enhancement and excretion of contrast by both kidneys. The visualized ureters and urinary bladder are unremarkable. Stomach/Bowel: Postsurgical changes of partial sigmoid resection with anastomotic suture. There is extensive sigmoid and diffuse colonic diverticulosis without active inflammatory changes. Diffusely dilated loops of small bowel throughout the abdomen some of which are fecalized measuring up to 3.6 cm in caliber. There is a transition in the right lower quadrant (series 5, image 66 and coronal  series 2 image 67). There is slight swirling of the mesentery at this level which is likely postsurgical changes. Findings most consistent with obstruction likely secondary to adhesion although an internal hernia is not entirely excluded. There is no pneumatosis. There is a 6.5 cm duodenal diverticulum. Vascular/Lymphatic: Advanced aortoiliac atherosclerotic disease. There is a 2.5 cm infrarenal aortic ectasia. The origins of the celiac axis, SMA, IMA are patent. No portal venous gas. There is no adenopathy. Reproductive: Enlarged prostate gland measuring 6.5 cm in transverse axial diameter. Other: None Musculoskeletal: Degenerative changes of the spine. No acute osseous pathology. IMPRESSION: 1. Small-bowel obstruction with transition zone in the right lower quadrant likely secondary to adhesion although an internal hernia is not entirely excluded. No pneumatosis or portal venous gas. 2. Extensive colonic diverticulosis. 3. Aortic Atherosclerosis (ICD10-I70.0). Electronically Signed   By: Anner Crete M.D.   On: 05/08/2019 03:38   Dg Abd Portable 1v-small Bowel Obstruction Protocol-initial, 8 Hr Delay  Result Date: 05/09/2019 CLINICAL DATA:  Small bowel protocol 8 hour delay EXAM: PORTABLE ABDOMEN - 1 VIEW COMPARISON:  05/09/2019, CT 05/08/2019 FINDINGS: Persistent dilatation of central small bowel loops measuring up to 5 cm in diameter. Contrast material is present within the colon and rectum. IMPRESSION: Persistent dilatation of central small bowel, suggesting obstruction, however there is contrast present within the colon and rectum. Electronically Signed   By: Donavan Foil M.D.   On: 05/09/2019 19:53    Microbiology: Recent Results (from the past 240 hour(s))  SARS CORONAVIRUS 2 (TAT 6-12 HRS) Nasal Swab Aptima Multi Swab     Status: None   Collection Time: 05/08/19  5:08 AM   Specimen: Aptima Multi Swab; Nasal Swab  Result Value Ref Range Status   SARS Coronavirus 2 NEGATIVE NEGATIVE Final     Comment: (NOTE) SARS-CoV-2 target nucleic acids are NOT DETECTED. The SARS-CoV-2 RNA is generally detectable in upper and lower respiratory specimens during the acute phase of infection. Negative results do not preclude SARS-CoV-2 infection, do not rule out co-infections with other pathogens, and should not be used as the sole basis for treatment or other patient management decisions. Negative results must be combined with clinical observations, patient history, and epidemiological information. The expected result is Negative. Fact Sheet for Patients: SugarRoll.be Fact Sheet for Healthcare Providers: https://www.woods-mathews.com/ This test is not yet approved or cleared by the [place] FDA and  has been authorized for detection and/or diagnosis of SARS-CoV-2 by FDA under an Emergency Use Authorization (EUA). This EUA will remain  in effect (meaning this test can be used) for the duration of the COVID-19 declaration under Section 56 4(b)(1) of the Act, 21 U.S.C. section 360bbb-3(b)(1), unless the authorization is terminated or revoked sooner. Performed at [facility], [place] [address], [place], [place] [address]      Labs: Basic Metabolic Panel: Recent Labs  Lab 05/08/19 0124 05/09/19 0809 05/10/19 0224 05/11/19  0417  NA 137 136 141 139  K 3.8 4.2 3.9 3.6  CL 95* 98 100 103  CO2 29 26 26 24   GLUCOSE 184* 121* 117* 132*  BUN 26* 20 22 18   CREATININE 1.30* 1.23 1.28* 1.12  CALCIUM 9.9 10.1 9.6 9.3  MG 1.9  --   --  1.9   Liver Function Tests: Recent Labs  Lab 05/08/19 0124 05/09/19 0809 05/10/19 0224 05/11/19 0417  AST 20 18 18 25   ALT 20 18 17 18   ALKPHOS 109 122 107 110  BILITOT 1.4* 2.2* 1.9* 1.7*  PROT 7.3 7.5 6.7 6.8  ALBUMIN 4.4 4.4 4.1 4.1   Recent Labs  Lab 05/08/19 0124  LIPASE 26   No results for input(s): AMMONIA in the last 168 hours. CBC: Recent Labs  Lab 05/08/19 0124 05/09/19 0809  05/10/19 0226 05/11/19 0417  WBC 10.2 8.4 7.1 6.9  NEUTROABS 8.7* 6.5  --   --   HGB 16.5 16.5 15.4 15.2  HCT 49.9 49.9 47.3 46.0  MCV 90.7 92.1 91.8 90.7  PLT 122* 126* 107* 125*   Cardiac Enzymes: No results for input(s): CKTOTAL, CKMB, CKMBINDEX, TROPONINI in the last 168 hours. BNP: BNP (last 3 results) No results for input(s): BNP in the last 8760 hours.  ProBNP (last 3 results) No results for input(s): PROBNP in the last 8760 hours.  CBG: Recent Labs  Lab 05/10/19 2030 05/10/19 2356 05/11/19 0401 05/11/19 0748 05/11/19 1146  GLUCAP 119* 110* 143* 117* 92       Signed:  Nita Sells MD   [facility] 05/11/2019, 11:59 AM

## 2019-05-11 NOTE — Progress Notes (Signed)
ANTICOAGULATION CONSULT NOTE - Follow Up Consultation  Pharmacy Consult for Heparin Indication: atrial fibrillation  No Known Allergies  Patient Measurements: Height: 5' 8.27" (173.4 cm) Weight: 188 lb 4.4 oz (85.4 kg) IBW/kg (Calculated) : 69.02 Heparin Dosing Weight: 85.4  Vital Signs: Temp: 98.1 F (36.7 C) (08/28 0359) Temp Source: Oral (08/28 0359) BP: 156/126 (08/28 0359) Pulse Rate: 69 (08/28 0359)  Labs: Recent Labs    05/09/19 0809  05/10/19 0224 05/10/19 0226 05/10/19 0758 05/10/19 1803 05/11/19 0417  HGB 16.5  --   --  15.4  --   --  15.2  HCT 49.9  --   --  47.3  --   --  46.0  PLT 126*  --   --  107*  --   --  125*  APTT 32   < >  --   --  68* 62* 71*  LABPROT 13.1  --   --   --   --   --   --   INR 1.0  --   --   --   --   --   --   HEPARINUNFRC 1.22*  --   --   --  0.87*  --  0.52  CREATININE 1.23  --  1.28*  --   --   --  1.12   < > = values in this interval not displayed.    Estimated Creatinine Clearance: 61.9 mL/min (by C-G formula based on SCr of 1.12 mg/dL).   Medications:  Medications Prior to Admission  Medication Sig Dispense Refill Last Dose  . albuterol (PROVENTIL) (2.5 MG/3ML) 0.083% nebulizer solution Take 3 mLs (2.5 mg total) by nebulization every 6 (six) hours as needed for up to 4 doses for wheezing or shortness of breath. 1080 mL 0 Past Week at Unknown time  . albuterol (VENTOLIN HFA) 108 (90 Base) MCG/ACT inhaler Use 2 puffs every 4-6 hours as needed for wheezing or shortness of breath 24 g 3 05/07/2019 at Unknown time  . allopurinol (ZYLOPRIM) 300 MG tablet Take 300 mg by mouth daily.   05/07/2019 at Unknown time  . apixaban (ELIQUIS) 5 MG TABS tablet Take 1 tablet (5 mg total) by mouth 2 (two) times daily. 180 tablet 1 05/07/2019 at 930pm  . atorvastatin (LIPITOR) 40 MG tablet Take 40 mg by mouth daily.   05/07/2019 at Unknown time  . dicyclomine (BENTYL) 10 MG capsule Take 1 capsule by mouth every 6 (six) hours as needed for spasms.     05/07/2019 at Unknown time  . Fluticasone-Umeclidin-Vilant (TRELEGY ELLIPTA) 100-62.5-25 MCG/INH AEPB Inhale 1 puff into the lungs daily. 3 each 3 05/07/2019 at Unknown time  . furosemide (LASIX) 20 MG tablet Take 20 mg (1 Tab) every other day 90 tablet 1 05/06/2019  . ipratropium (ATROVENT) 0.03 % nasal spray Place 2 sprays into both nostrils every 12 (twelve) hours as needed for rhinitis.    05/07/2019 at Unknown time  . metoprolol succinate (TOPROL-XL) 25 MG 24 hr tablet Take 25 mg by mouth daily.   05/07/2019 at 0900  . nitroGLYCERIN (NITROSTAT) 0.4 MG SL tablet Place 0.4 mg under the tongue every 5 (five) minutes as needed for chest pain.   never used  . omeprazole (PRILOSEC) 40 MG capsule Take 40 mg by mouth 2 (two) times daily.   05/07/2019 at Unknown time  . tamsulosin (FLOMAX) 0.4 MG CAPS capsule Take 0.4 mg by mouth daily after supper.    05/07/2019 at Unknown time  .  traMADol (ULTRAM) 50 MG tablet Take 50 mg by mouth every 6 (six) hours as needed for moderate pain.    05/07/2019 at Unknown time  . traZODone (DESYREL) 100 MG tablet Take 100 mg by mouth at bedtime.   05/07/2019 at Unknown time    Assessment: 9 YOM with a PMH of Afib, on Eliquis, chronic constipation and small bowel obstruction. Presented to the ED on 05/07/49 with c/o abdominal pain and distension. Eliquis currently on hold, as pt is NPO. Pharmacy to dose heparin.   Prior Anticoagulation: Eliquis 5 mg BID, LD 05/07/19 @ 2130  [place]: INR normal, HL supratherapeutic, aPTT normal  Today, 05/09/19:  - CBC wnl, platelets stabilizing - HL and aPTT therapeutic on heparin 1,450 units/hr - No s/sx of bleeding or infusion issue reported, per RN  - SBO improving on clear liquids; no surgery needed - Transitioning back to Eliquis for discharge  Goal of Therapy:  Heparin level 0.3-0.7 units/ml aPTT 66-102 seconds Monitor platelets by anticoagulation protocol: Yes   Plan:  - Discontinue heparin drip - Give 1st dose of Eliquis 5  mg at the same time that heparin drip is turned off - Monitor for s/sx of bleeding  Jonathon Bellows, PharmD Candidate 05/11/2019 10:50 AM

## 2019-05-11 NOTE — Progress Notes (Signed)
Patient stated he had 3 incidences of small, runny bowel movements overnight.

## 2019-05-16 ENCOUNTER — Ambulatory Visit: Payer: Medicare Other | Admitting: Pulmonary Disease

## 2019-05-16 DIAGNOSIS — Z09 Encounter for follow-up examination after completed treatment for conditions other than malignant neoplasm: Secondary | ICD-10-CM | POA: Diagnosis not present

## 2019-05-16 DIAGNOSIS — R109 Unspecified abdominal pain: Secondary | ICD-10-CM | POA: Diagnosis not present

## 2019-05-16 DIAGNOSIS — Z23 Encounter for immunization: Secondary | ICD-10-CM | POA: Diagnosis not present

## 2019-05-17 ENCOUNTER — Telehealth (HOSPITAL_COMMUNITY): Payer: Self-pay | Admitting: *Deleted

## 2019-05-17 NOTE — Telephone Encounter (Signed)
Left message on voicemail in reference to upcoming appointment scheduled for 05/22/19. Phone number given for a call back so details instructions can be given.  Juan Valdez

## 2019-05-28 ENCOUNTER — Telehealth: Payer: Self-pay | Admitting: Pulmonary Disease

## 2019-05-28 NOTE — Telephone Encounter (Signed)
Attempted to call patient, reception issues, called back and left a message to call the office back.

## 2019-05-28 NOTE — Telephone Encounter (Signed)
With patient's recent hospital discharge as well as worsened acute symptoms patient needs a video visit with APP or Dr. Lamonte Sakai to further evaluate.  Please schedule this accordingly.  Patient also likely needs to be considered for outpatient COVID testing despite the fact that he tested negative earlier this month as it is already been 2 weeks.  This can be decided at the video visit.  If patient is unable to do a video visit due to not having the capabilities to complete this then can be a tele-visit.  Wyn Quaker, FNP

## 2019-05-28 NOTE — Telephone Encounter (Signed)
Primary Pulmonologist: [place] Last office visit and with whom: 03/14/19 Wyn Quaker What do we see them for (pulmonary problems): COPD Last OV assessment/plan: Assessment and Plan:  COPD (chronic obstructive pulmonary disease) ([place]) Assessment:  Patient needs formal pulmonary function testing, scheduled for July/2020 171-pack-year smoking history Maintained on Trelegy Ellipta inhaler Patient reports that he is maintained well with his Trelegy Ellipta Using [place] nebulized meds 2 times daily Patient prefers Ventolin HFA for rescue inhaler which his insurance will not cover it is a tier for medication  Plan: Continue Trelegy Ellipta Okay to use albuterol nebulized medication in place of rescue inhaler during this time can use every 6 hours as needed for shortness of breath or wheezing Albuterol nebulized medications refilled today Okay to stop pro-air Referral to [facility] for medication assistance, specifically for help with cost of Trelegy Ellipta as you have spent over $600 out of pocket, and potentially could qualify then for also Ventolin Follow-up with our office later on this month as planned with a pulmonary function test  Medication management Plan: Referral to [facility] for medication management Patient reports he spent over $600 out of pocket so far this year of medication cost Could potentially qualify for [place] for you program for help with Trelegy Ellipta as well as Ventolin  Former smoker Assessment: Former smoker Quit 2015 171-pack-year smoking history 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  States this is his normal bronchitis, was in hospital on 9/2 testsed  negative for covid   Reason for call: patient is having shortness of breath, wheezing, congestion, cough, sore throat, denies fever, muscle aches, n/v/d and traveling.   [name] please advise.

## 2019-05-29 ENCOUNTER — Other Ambulatory Visit: Payer: Self-pay

## 2019-05-29 ENCOUNTER — Encounter: Payer: Self-pay | Admitting: Emergency Medicine

## 2019-05-29 ENCOUNTER — Ambulatory Visit (INDEPENDENT_AMBULATORY_CARE_PROVIDER_SITE_OTHER): Payer: Medicare Other | Admitting: Emergency Medicine

## 2019-05-29 DIAGNOSIS — J309 Allergic rhinitis, unspecified: Secondary | ICD-10-CM

## 2019-05-29 DIAGNOSIS — J301 Allergic rhinitis due to pollen: Secondary | ICD-10-CM | POA: Diagnosis not present

## 2019-05-29 DIAGNOSIS — C3491 Malignant neoplasm of unspecified part of right bronchus or lung: Secondary | ICD-10-CM | POA: Diagnosis not present

## 2019-05-29 DIAGNOSIS — J431 Panlobular emphysema: Secondary | ICD-10-CM

## 2019-05-29 HISTORY — DX: Allergic rhinitis, unspecified: J30.9

## 2019-05-29 MED ORDER — DOXYCYCLINE HYCLATE 100 MG PO TABS: 100.0000 mg | ORAL_TABLET | Freq: Two times a day (BID) | ORAL | 0 refills | Status: DC

## 2019-05-29 MED ORDER — PREDNISONE 10 MG PO TABS: ORAL_TABLET | ORAL | 0 refills | Status: DC

## 2019-05-29 NOTE — Telephone Encounter (Signed)
Noted. Thanks Dorothyann Peng. Will close encounter.

## 2019-05-29 NOTE — Assessment & Plan Note (Signed)
We will discuss timing of any follow-up imaging at his next office visit

## 2019-05-29 NOTE — Progress Notes (Signed)
Virtual Visit via Telephone Note  I connected with [employer] on 05/29/19 at  2:15 PM EDT by telephone and verified that I am speaking with the correct person using two identifiers.  Location: Patient: Home Provider: Office   I discussed the limitations, risks, security and privacy concerns of performing an evaluation and management service by telephone and the availability of in person appointments. I also discussed with the patient that there may be a patient responsible charge related to this service. The patient expressed understanding and agreed to proceed.   History of Present Illness: 73 year old former smoker (100 pack years) with A. fib, CAD, diabetes.  We follow him for severe COPD, history of non-small cell lung cancer post right lower lobe lobectomy 2015.   Observations/Objective: He was recently admitted to the hospital end of August for an apparent small bowel obstruction.  He has been on Trelegy since March.  He hasn't had his PFT yet - is willing to perform. He is doing a cardiac stress test this month.   Beginning 4 days ago he has developed some sore throat, increased dyspnea, chest congestion but unable to cough anything up, sneezing. He hears some wheeze. He has been isolated, no sick contacts. Afebrile. He has been using his Atrovent NS. He is able to remains active. Difficulty sleeping due to dyspnea. He states that he has similar problems around this time every year.   Assessment and Plan:  COPD (chronic obstructive pulmonary disease) ([place]) With a suspected evolving acute exacerbation, likely being driven by the season, more prevalent allergies.  I think is reasonable to treat him for an acute exacerbation.  Prednisone taper, doxycycline ordered.  He will continue his Trelegy.  Instructed him to use albuterol nebs as needed.  He has ipratropium nebs as well but have asked him not to use these as they are redundant with the Trelegy.  He needs pulmonary function  testing but I like to do these after he stabilizes from this exacerbation.  I will see him back on the same day to review.  Non-small cell cancer of right lung [facility]) We will discuss timing of any follow-up imaging at his next office visit  Allergic rhinitis He has ipratropium nasal spray that he uses as needed.  I think he should start fluticasone nasal spray daily for seasonal allergies.  He seems to flare around the same time each year.  He can keep the ipratropium nasal spray to use as needed as well.    Follow Up Instructions: Next available with full PFTs   I discussed the assessment and treatment plan with the patient. The patient was provided an opportunity to ask questions and all were answered. The patient agreed with the plan and demonstrated an understanding of the instructions.   The patient was advised to call back or seek an in-person evaluation if the symptoms worsen or if the condition fails to improve as anticipated.  I provided 23 minutes of non-face-to-face time during this encounter.   Collene Gobble, MD

## 2019-05-29 NOTE — Assessment & Plan Note (Signed)
With a suspected evolving acute exacerbation, likely being driven by the season, more prevalent allergies.  I think is reasonable to treat him for an acute exacerbation.  Prednisone taper, doxycycline ordered.  He will continue his Trelegy.  Instructed him to use albuterol nebs as needed.  He has ipratropium nebs as well but have asked him not to use these as they are redundant with the Trelegy.  He needs pulmonary function testing but I like to do these after he stabilizes from this exacerbation.  I will see him back on the same day to review.

## 2019-05-29 NOTE — Telephone Encounter (Signed)
PT RETURNING CALL AND CAN BE REACHED @ [phone].Juan Valdez

## 2019-05-29 NOTE — Assessment & Plan Note (Signed)
He has ipratropium nasal spray that he uses as needed.  I think he should start fluticasone nasal spray daily for seasonal allergies.  He seems to flare around the same time each year.  He can keep the ipratropium nasal spray to use as needed as well.

## 2019-05-29 NOTE — Telephone Encounter (Signed)
LMTCB. Please schedule patient a Video or Tele-visit with Dr. Lamonte Sakai or APP.

## 2019-05-29 NOTE — Telephone Encounter (Signed)
Pt called back sched him w/RB @ 2:15 televisit.Juan Valdez

## 2019-05-30 ENCOUNTER — Telehealth: Payer: Self-pay | Admitting: Emergency Medicine

## 2019-05-30 DIAGNOSIS — K589 Irritable bowel syndrome without diarrhea: Secondary | ICD-10-CM | POA: Diagnosis not present

## 2019-05-30 DIAGNOSIS — E785 Hyperlipidemia, unspecified: Secondary | ICD-10-CM | POA: Diagnosis not present

## 2019-05-30 NOTE — Telephone Encounter (Signed)
LMOM TCB x1  Patient last seen by RB on 9.15.2020 and it looks like Flonase nasal spray was recommended (this is OTC) but he also uses ipratropium nasal spray.  Which one does he need?  Or both?  Would like to verify before sending Rx.  Per 9.15.2020 visit with RB: COPD (chronic obstructive pulmonary disease) (HCC) - Collene Gobble, MD at 05/29/2019  2:24 PM  Status: Written  Related Problem: COPD (chronic obstructive pulmonary disease) ([place])    With a suspected evolving acute exacerbation, likely being driven by the season, more prevalent allergies.  I think is reasonable to treat him for an acute exacerbation.  Prednisone taper, doxycycline ordered.  He will continue his Trelegy.  Instructed him to use albuterol nebs as needed.  He has ipratropium nebs as well but have asked him not to use these as they are redundant with the Trelegy.  He needs pulmonary function testing but I like to do these after he stabilizes from this exacerbation.  I will see him back on the same day to review.    Non-small cell cancer of right lung (HCC) - Collene Gobble, MD at 05/29/2019  2:25 PM  Status: Written  Related Problem: Non-small cell cancer of right lung [facility])    We will discuss timing of any follow-up imaging at his next office visit    Allergic rhinitis - Collene Gobble, MD at 05/29/2019  2:25 PM  Status: Written  Related Problem: Allergic rhinitis    He has ipratropium nasal spray that he uses as needed.  I think he should start fluticasone nasal spray daily for seasonal allergies.  He seems to flare around the same time each year.  He can keep the ipratropium nasal spray to use as needed as well.

## 2019-06-01 MED ORDER — IPRATROPIUM BROMIDE 0.03 % NA SOLN: 2.0000 | Freq: Two times a day (BID) | NASAL | 6 refills | Status: DC | PRN

## 2019-06-01 NOTE — Telephone Encounter (Signed)
Called spoke with sent in Rx for atrovent nasal spray and explained flonase was suggested to use daily for patient will pick up over the counter.   Nothing further needed at this time.

## 2019-06-01 NOTE — Telephone Encounter (Signed)
Called pt to schedule pft - pt wants refill of Atrovent sent to [facility] on [address] in [place] - pt can be reached at [phone]- ok to speak to pt wife -pr

## 2019-06-13 ENCOUNTER — Telehealth (HOSPITAL_COMMUNITY): Payer: Self-pay | Admitting: *Deleted

## 2019-06-13 ENCOUNTER — Telehealth: Payer: Self-pay | Admitting: Cardiology

## 2019-06-13 NOTE — Telephone Encounter (Signed)
Patient given detailed instructions per Myocardial Perfusion Study Information Sheet for the test on 06/20/19. Patient notified to arrive 15 minutes early and that it is imperative to arrive on time for appointment to keep from having the test rescheduled.  If you need to cancel or reschedule your appointment, please call the office within 24 hours of your appointment. . Patient verbalized understanding. Kirstie Peri

## 2019-06-13 NOTE — Telephone Encounter (Signed)
Wants samples of eliquis

## 2019-06-14 ENCOUNTER — Other Ambulatory Visit: Payer: Self-pay

## 2019-06-14 ENCOUNTER — Ambulatory Visit (INDEPENDENT_AMBULATORY_CARE_PROVIDER_SITE_OTHER): Payer: Medicare Other | Admitting: Pulmonary Disease

## 2019-06-14 ENCOUNTER — Encounter: Payer: Self-pay | Admitting: Pulmonary Disease

## 2019-06-14 DIAGNOSIS — K56609 Unspecified intestinal obstruction, unspecified as to partial versus complete obstruction: Secondary | ICD-10-CM | POA: Diagnosis not present

## 2019-06-14 DIAGNOSIS — J431 Panlobular emphysema: Secondary | ICD-10-CM | POA: Diagnosis not present

## 2019-06-14 DIAGNOSIS — J441 Chronic obstructive pulmonary disease with (acute) exacerbation: Secondary | ICD-10-CM | POA: Insufficient documentation

## 2019-06-14 HISTORY — DX: Chronic obstructive pulmonary disease with (acute) exacerbation: J44.1

## 2019-06-14 MED ORDER — PREDNISONE 10 MG PO TABS: ORAL_TABLET | ORAL | 0 refills | Status: DC

## 2019-06-14 NOTE — Assessment & Plan Note (Signed)
Plan:  Needs outpatient follow up  [place] discharge

## 2019-06-14 NOTE — Assessment & Plan Note (Signed)
Plan: Prednisone taper today Continue Trelegy Ellipta Continue albuterol nebulizers as needed for shortness of breath and wheezing Keep scheduled appointment with our office later this month for pulmonary function testing  I will follow-up with Dr. Lamonte Sakai to see if he would like for you to follow-up directly with him as was previously indicated on his last office note.

## 2019-06-14 NOTE — Assessment & Plan Note (Signed)
Plan: Prednisone taper today  As discussed with patient over the phone we will hold off on antibiotics at this time as patient just recently completed a course of doxycycline.  If patient starts to develop discolored mucus, fevers, body aches then he will contact our office

## 2019-06-14 NOTE — Patient Instructions (Addendum)
You were seen today by Lauraine Rinne, NP  for:   1. COPD with acute exacerbation (HCC)  - predniSONE (DELTASONE) 10 MG tablet; Take 4 tablets for 3 days, 3 tablets for 3 days, 2 tablets for 3 days, 1 tablet for 3 days  Dispense: 30 tablet; Refill: 0  We will hold off on antibiotics at this time as she recently completed doxycycline.  If you start to have discolored mucus, fevers, body aches, increased fatigue please contact our office.  2. SBO (small bowel obstruction) ([place])  Please contact your outpatient gastroenterologist   3. Panlobular emphysema (HCC)  Trelegy Ellipta  >>> 1 puff daily in the morning >>>rinse mouth out after use  >>> This inhaler contains 3 medications that help manage her respiratory status, contact our office if you cannot afford this medication or unable to remain on this medication  Continue albuterol nebulized treatments every 6-8 hours as needed for shortness of breath or wheezing  We will prescribe a prednisone taper today Complete pulmonary function testing as ordered  I will discuss with Dr. Lamonte Sakai to see if you would like for you to follow-up with him on a separate day after completing pulmonary function testing so that he could review this with you in person.  For right now, please proceed as previously scheduled with pulmonary function testing as well as a follow-up with Geraldo Pitter NP will she will review your pulmonary function testing results.   We recommend today:  No orders of the defined types were placed in this encounter.  No orders of the defined types were placed in this encounter.  Meds ordered this encounter  Medications  . predniSONE (DELTASONE) 10 MG tablet    Sig: Take 4 tablets for 3 days, 3 tablets for 3 days, 2 tablets for 3 days, 1 tablet for 3 days    Dispense:  30 tablet    Refill:  0    Follow Up:    Return in about 4 weeks (around 07/12/2019), or if symptoms worsen or fail to improve, for Follow up for PFT, Follow  up with Derl Barrow AGNP-BC.   Please do your part to reduce the spread of COVID-19:      Reduce your risk of any infection  and COVID19 by using the similar precautions used for avoiding the common cold or flu:  Marland Kitchen Wash your hands often with soap and warm water for at least 20 seconds.  If soap and water are not readily available, use an alcohol-based hand sanitizer with at least 60% alcohol.  . If coughing or sneezing, cover your mouth and nose by coughing or sneezing into the elbow areas of your shirt or coat, into a tissue or into your sleeve (not your hands). Langley Gauss A MASK when in public  . Avoid shaking hands with others and consider head nods or verbal greetings only. . Avoid touching your eyes, nose, or mouth with unwashed hands.  . Avoid close contact with people who are sick. . Avoid places or events with large numbers of people in one location, like concerts or sporting events. . If you have some symptoms but not all symptoms, continue to monitor at home and seek medical attention if your symptoms worsen. . If you are having a medical emergency, call 911.   Isabela / e-Visit: [email or web address]         [facility]: [facility]: [phone]  [facility]: [phone]     It is flu season:   >>> Best ways to protect herself from the flu: Receive the yearly flu vaccine, practice good hand hygiene washing with soap and also using hand sanitizer when available, eat a nutritious meals, get adequate rest, hydrate appropriately   Please contact the office if your symptoms worsen or you have concerns that you are not improving.   Thank you for choosing [facility] for your healthcare, and for allowing [place] to partner with you on your healthcare journey. I am thankful to be able to provide care to  you today.   Wyn Quaker FNP-C

## 2019-06-14 NOTE — Progress Notes (Signed)
Virtual Visit via Telephone Note  I connected with Juan Valdez on 06/14/19 at  1:30 PM EDT by telephone and verified that I am speaking with the correct person using two identifiers.  Location: Patient: Home Provider: Office [facility] - [address], [place], [place], [place] [address]   I discussed the limitations, risks, security and privacy concerns of performing an evaluation and management service by telephone and the availability of in person appointments. I also discussed with the patient that there may be a patient responsible charge related to this service. The patient expressed understanding and agreed to proceed.  Patient consented to consult via telephone: Yes People present and their role in pt care: Pt   History of Present Illness: 73 year old male former smoker followed in our office for COPD.  Patient has not completed pulmonary function testing yet.  Patient is status post right lower lobectomy in 2015.  Past medical history: A. fib, CAD, dyslipidemia, hypertension, small bowel obstruction Smoking history: Former smoker.  Quit 2015.  171 pack years. Maintenance: Trelegy Ellipta Patient of Dr. Lamonte Sakai  Chief complaint: COPD, shortness of breath, wheezing  73 year old male former smoker followed in our office for suspected COPD.  Patient with heavy smoking history (171 pack years).  Patient has not completed formal pulmonary function testing.  Patient is status post right lower lobectomy in 2015.  He is maintained on Trelegy Ellipta.  He reports that he is had worsened cough and wheezing over the last 2 weeks.  He was recently treated last month telephonically by Dr. Lamonte Sakai for a COPD exacerbation with prednisone as well as doxycycline.  He felt that those medications did help him with his breathing.  Unfortunately after he completed them about a week later patient started to have worsening symptoms.  Patient denies fever, body aches, chills.  He does have a dry  cough with no discolored mucus if it is productive.  He is audibly wheezing on the phone.  Heart rate and oxygen levels were stable per patient over the telephone see below.  Observations/Objective:  06/14/2019 - HR - 76  06/14/2019 - Spo2 - 93  10/18/2018  Echo: EF of 45-50%, Normal RV systolic function, Severe dilation of LA,. Mildly dilated RA, MV regurge and moderate thickening, Mild thickening of a tricuspic  Aortic valve with mild thickening.  12/21/2018: CXR IMPRESSION: Elevated left hemidiaphragm with adjacent atelectasis. Mild cardiomegaly with aortic tortuosity. Central bronchial thickening. No focal airspace disease. No pleural effusion, pneumothorax or pulmonary edema.  Assessment and Plan:  SBO (small bowel obstruction) ([place]) Plan:  Needs outpatient follow up  Recent Hosp discharge   COPD with acute exacerbation ([place]) Plan: Prednisone taper today  As discussed with patient over the phone we will hold off on antibiotics at this time as patient just recently completed a course of doxycycline.  If patient starts to develop discolored mucus, fevers, body aches then he will contact our office  COPD (chronic obstructive pulmonary disease) ([place]) Plan: Prednisone taper today Continue Trelegy Ellipta Continue albuterol nebulizers as needed for shortness of breath and wheezing Keep scheduled appointment with our office later this month for pulmonary function testing  I will follow-up with Dr. Lamonte Sakai to see if he would like for you to follow-up directly with him as was previously indicated on his last office note.   Follow Up Instructions:  Return in about 4 weeks (around 07/12/2019), or if symptoms worsen or fail to improve, for Follow up for PFT, Follow up  with Derl Barrow AGNP-BC.   I discussed the assessment and treatment plan with the patient. The patient was provided an opportunity to ask questions and all were answered. The patient agreed with the plan and demonstrated  an understanding of the instructions.   The patient was advised to call back or seek an in-person evaluation if the symptoms worsen or if the condition fails to improve as anticipated.  I provided 24 minutes of non-face-to-face time during this encounter.   Lauraine Rinne, NP

## 2019-06-15 ENCOUNTER — Other Ambulatory Visit: Payer: Self-pay | Admitting: Emergency Medicine

## 2019-06-18 ENCOUNTER — Telehealth: Payer: Self-pay | Admitting: Cardiology

## 2019-06-18 NOTE — Telephone Encounter (Signed)
Pt is having SOB and feeling icky, wants to know if he should still come for his nuc on Wednesday

## 2019-06-19 ENCOUNTER — Other Ambulatory Visit: Payer: Self-pay | Admitting: *Deleted

## 2019-06-19 ENCOUNTER — Telehealth: Payer: Self-pay | Admitting: Emergency Medicine

## 2019-06-19 DIAGNOSIS — J441 Chronic obstructive pulmonary disease with (acute) exacerbation: Secondary | ICD-10-CM

## 2019-06-19 MED ORDER — LEVOFLOXACIN 500 MG PO TABS: 500.0000 mg | ORAL_TABLET | Freq: Every day | ORAL | 0 refills | Status: DC

## 2019-06-19 MED ORDER — PROAIR HFA 108 (90 BASE) MCG/ACT IN AERS: INHALATION_SPRAY | RESPIRATORY_TRACT | 1 refills | Status: DC

## 2019-06-19 NOTE — Telephone Encounter (Signed)
LMTCB for the pt 

## 2019-06-19 NOTE — Telephone Encounter (Addendum)
Spoke with pt. States that he is not feeling any better since his televisit with [name] on 06/14/2019. Reports that his breathing is worse. He is having increased shortness of breath, coughing and chest congestion. Cough is non productive at this time. Denies chest tightness, wheezing, fever, sick contacts or recent travel. [name] gave the pt a prednisone taper and advised him to contact us back if he didn't improve.  [name] - please advise. Thanks!

## 2019-06-19 NOTE — Telephone Encounter (Signed)
Spoke with pt. He is aware of Brian's response. Pt wishes to be tested for COVID in [place]. He is going to have his PCP do it and have them send [place] a copy of the results.

## 2019-06-19 NOTE — Telephone Encounter (Signed)
Patient is not feeling well and rescheduled Lexiscan til end of October he is on prednisone and just not well.

## 2019-06-19 NOTE — Telephone Encounter (Signed)
Sorry to hear the patient is not feeling better.  Patient recently completed doxycycline in September/2020 with Dr. Lamonte Sakai.  I have sent in Levaquin:  Levaquin 500mg  tablet >>> Take 1 500 mg tablet daily for the next 7 days >>> Take with food >>> start probiotic for good gut health >>>avoid rigorous exercise for next 2 weeks   I would also like for the patient to get COVID tested as symptoms are not improving.  And breathing is getting worse.  Please notify the patient that he can present to any of these testing sites to become tested:  COVID Testing Site Locations (For sick patients only, pre-procedure is done differently)  . [facility] [address] [place],  [address] . [place]  (on [employer])  o Nature conservation officer  . [facility]   Please also make sure patient has a 2 to 4-week follow-up with Dr. Lamonte Sakai.  This would obviously need to be telephonic or video visit if patient is in fact positive for COVID.  Wyn Quaker, FNP

## 2019-06-19 NOTE — Telephone Encounter (Signed)
Pt returned called and would like a call back

## 2019-06-20 ENCOUNTER — Telehealth: Payer: Self-pay | Admitting: Primary Care

## 2019-06-20 DIAGNOSIS — J302 Other seasonal allergic rhinitis: Secondary | ICD-10-CM | POA: Diagnosis not present

## 2019-06-20 DIAGNOSIS — J40 Bronchitis, not specified as acute or chronic: Secondary | ICD-10-CM | POA: Diagnosis not present

## 2019-06-20 DIAGNOSIS — J329 Chronic sinusitis, unspecified: Secondary | ICD-10-CM | POA: Diagnosis not present

## 2019-06-20 DIAGNOSIS — J449 Chronic obstructive pulmonary disease, unspecified: Secondary | ICD-10-CM | POA: Diagnosis not present

## 2019-06-20 NOTE — Telephone Encounter (Signed)
Patrice, please advise. Would we be able to reschedule pt for PFT on Friday 06/22/2019 since he received his COVID swab today 06/20/2019? Thank you.

## 2019-06-20 NOTE — Telephone Encounter (Signed)
The coronavirus test needs to be done within 3 days of the PFT.  Do not believe we can wave that rule.  May be the date of the PFT can be moved up so that he does not have to have the test repeated?

## 2019-06-20 NOTE — Telephone Encounter (Signed)
Call returned to patient, confirmed DOB, he reports he had his covid test today, they did a rapid test and they told him it was negative. He states they did the deeper swab to be sure it was negative. This will take 2-3 days to result. Patient is wanting to know whether he has to repeat the Covid test next week for his PFT. I made him aware that if we are able to get the results he may not have to repeat the covid test but I will get the message to his provider and get back with him.  RB please advise if patient will need to get his covid tested repeated for his PFT next week. He is expecting the results my Friday or Monday. Thanks.

## 2019-06-22 NOTE — Telephone Encounter (Signed)
Left message for patient to call back  

## 2019-06-22 NOTE — Telephone Encounter (Signed)
I don't see any results from his COVID testing - I asked Burman Nieves to look behind me, and she said she only sees the community testing COVID swab which not the same has the pre-procedure COVID test, and the results are still not in. If he doesn't have the pre-procedure test he will still need to keep the appt for 06/29/2019 for the pre-procedure testing.- pr

## 2019-06-24 NOTE — Telephone Encounter (Signed)
06/24/2019 2037  Patient was requested to have COVID testing he reported that he will have his PCP test him for COVID on the triage note earlier this week.  We may need to contact his PCP to see if this actually was completed.  He will need to have faxed those results over to our office.  Wyn Quaker, FNP

## 2019-06-25 NOTE — Telephone Encounter (Signed)
Pt calling back to see if covid results were in and asking if he has to have again for appt on 10/21.  Please advise.  [phone]

## 2019-06-25 NOTE — Telephone Encounter (Signed)
Spoke with the pt  We still do not have the results of his results of the covid test from his PCP Dr Helene Kelp done on 06/21/19  I advised that his PFT is on 07/04/19 so he will have to unfortunately have to have the test done again bc no sooner availability for PFT   He is again requesting to have this done at her PCP office Dr Helene Kelp bc the location is closer and he does not want to drive to [place]  Will forward to Burman Nieves Please advise thanks

## 2019-06-25 NOTE — Telephone Encounter (Signed)
Called and spoke to [name], listed on DPR. Patient was unavailable at the time of call.  Let her know that patient would still need to go to scheduled pre-procedure covid testing prior to PFT. She verbalized understanding and stated she will relay the message to patient.  Will leave encounter open for patient if he calls back with questions.

## 2019-06-29 ENCOUNTER — Telehealth: Payer: Self-pay | Admitting: Emergency Medicine

## 2019-06-29 ENCOUNTER — Other Ambulatory Visit (HOSPITAL_COMMUNITY): Admission: RE | Admit: 2019-06-29 | Payer: Medicare Other | Source: Ambulatory Visit

## 2019-06-29 ENCOUNTER — Telehealth: Payer: Self-pay | Admitting: Primary Care

## 2019-06-29 NOTE — Telephone Encounter (Signed)
Noted  

## 2019-06-29 NOTE — Telephone Encounter (Signed)
I have resc pft and ov and covid pt aware

## 2019-06-29 NOTE — Telephone Encounter (Signed)
Pt returning call.  [phone].

## 2019-06-30 ENCOUNTER — Other Ambulatory Visit (HOSPITAL_COMMUNITY): Payer: Medicare Other

## 2019-06-30 ENCOUNTER — Telehealth: Payer: Self-pay | Admitting: Pulmonary Disease

## 2019-06-30 NOTE — Telephone Encounter (Signed)
PCCM:  Patient called stating that he woke up this morning and more short of breath than his baseline.  His home SPO2 monitor shows O2 sats at 83%.  Patient has COPD at baseline.  I encouraged him to seek medical attention.  He states that he did not want to go to [facility] even though it was the closest.  I recommended coming to a [facility] facility here in [place].  He states that he has been to talk to his wife about either driving up to [facility] or going to the local urgent care.  Patient is to call us and let us know if there is any issues.  Once again I reiterated that he should be seen by medical care today.  For if he continues to have declining symptoms at home that he or his wife should call EMS.  Garner Nash, DO [facility] 06/30/2019 8:11 AM

## 2019-07-01 NOTE — Telephone Encounter (Signed)
Noted.   C,  Can we contact the patient to see if his symptoms have improved? Did he go to Urgent Care, PCP, or ER? I am not seeing any records in the [facility] system that he sought out medical attention.   Wyn Quaker FNP

## 2019-07-02 DIAGNOSIS — M25569 Pain in unspecified knee: Secondary | ICD-10-CM | POA: Diagnosis not present

## 2019-07-03 NOTE — Telephone Encounter (Signed)
Spoke with patient to see how he was doing. He stated that he was not feeling any better. He was able to check his O2 while on the phone with me. O2 started out at 95% but dropped to 91%. Patient stated that he is not currently on O2 but "would love to be on O2". He also reported increased general body aches, especially around his neck. He has a non-productive cough. Increased fatigue and lack of appetite.   He stated that he has been on abx and prednisone before, which did not help. He was seen by his PCP yesterday Dr. Helene Kelp at [facility] in [place]. This OV has been requested.   Pharmacy is [facility] in [place].

## 2019-07-03 NOTE — Telephone Encounter (Signed)
Pt's PFT, OV, and covid test have all been r/s to 08/30/2019 per [facility] (see telephone note 06/29/2019). Will close out this message. Nothing further needed at this time.

## 2019-07-03 NOTE — Telephone Encounter (Signed)
07/03/2019 1202  Thank you for following up with the patient.  I have also reviewed office notes from primary care at [facility] family physicians Dr. Helene Kelp.  There is no formal documentation regarding patient's COVID-19 test.  Dr. Greggory Keen HPI specifically states: "Took Covid test here and he thinks it was negative".  We have not received any formal documentation on this.  We will need to request these records formally.  [facility] family physicians where the patient needs to provide this to our office.  If the patient has a documented negative test then I would recommend the patient have an in person evaluation in our office with an APP or with Dr. Lamonte Sakai.  Somehow in this office note with primary care they are also assessing the patient for knee pain and recommending knee surgery despite the fact that the patient is also documented to have increased shortness of breath.   First we need formal documentation from the patient showing that his Covid test was negative.  If negative then patient needs in person evaluation with our office and likely with a chest x-ray with an APP or Dr. Lamonte Sakai as soon as possible.  Patient would also benefit at that time from a walk in office.  Patient still needs to proceed forward with pulmonary function testing as ordered.  Routing to [place] for follow up, Routing to Dr. Valeta Harms and Lamonte Sakai as Juluis Rainier.   Wyn Quaker, FNP

## 2019-07-04 ENCOUNTER — Telehealth: Payer: Self-pay | Admitting: Emergency Medicine

## 2019-07-04 ENCOUNTER — Ambulatory Visit: Payer: Medicare Other | Admitting: Primary Care

## 2019-07-04 ENCOUNTER — Encounter (HOSPITAL_COMMUNITY): Payer: Self-pay | Admitting: *Deleted

## 2019-07-04 ENCOUNTER — Telehealth (HOSPITAL_COMMUNITY): Payer: Self-pay | Admitting: *Deleted

## 2019-07-04 NOTE — Telephone Encounter (Signed)
LMTCB

## 2019-07-04 NOTE — Telephone Encounter (Signed)
Called and spoke w/ pt regarding Beth's recommendations below. Pt verbalized understanding and states he is going to go to the ED via ambulance. I let him know the ED will likely perform a covid test while he is there, which he expressed understanding to.   Routing this message to Dr. Lamonte Sakai and [name] as an Juluis Rainier. Nothing further needed at this time.

## 2019-07-04 NOTE — Telephone Encounter (Signed)
If he is not getting better despite treatment I advise ED evaluation. He will not, however, be able to have PFTs and sleep study done there. These are out patient studies. We need to have proof of negative covid test or he can repeat at [facility]. Once we have covid testing he needs office visit with CXR with Dr. Lamonte Sakai or [name].

## 2019-07-04 NOTE — Telephone Encounter (Signed)
Thank you :)

## 2019-07-04 NOTE — Telephone Encounter (Signed)
Left message for patient to call back. Patient called back this morning wanting to know how to get a PFT and sleep study scheduled.

## 2019-07-04 NOTE — Telephone Encounter (Signed)
Attempted to call patient regarding upcoming appointment- no answer, sent letter with instructions to my chart.  Juan Valdez, Juan Valdez

## 2019-07-04 NOTE — Telephone Encounter (Signed)
Call returned to patient, he reports his breathing is horrible. He states he is SOB all the time and he does not seem to be getting any better. He wants to know if he should go the hospital and be admitted to have his PFT and sleep study. (patient was advise to go to ER in previous phone message 10.17.2020) He reports he is using his nebulizer 5x/day on some days. He reports he has completed the antibiotic and prednisone and does not feel better. He states he checks his oxygen at home and he typically stays in the 90's. Denies cough. Reports increased wheezing that only got better while he was taking the antibiotic and prednisone but is now worse.   Patient was put on prednisone and was told if he did not get better to call back for an antibiotic. Antibiotic given levauin 06/19/2019 and still does not feel any better. He denies all covid screening symptoms except for the SOB and wheezing. Reports his congestion has gotten better. Inquiring if he needs oxygen. He states he was tested for covid and it was negative but he was unable to get [place] the results.    I made him aware I would get this message to a provider and get back with him.   Beth please advise. Thanks. Please see previous phone notes.

## 2019-07-04 NOTE — Telephone Encounter (Signed)
Thank you Beth agreed. We have suggested emergent evaluation for a few days now.   Appreciate you helping with this   B

## 2019-07-05 NOTE — Telephone Encounter (Signed)
Thanks to all. Agree with the plan

## 2019-07-06 ENCOUNTER — Telehealth: Payer: Self-pay | Admitting: Emergency Medicine

## 2019-07-06 DIAGNOSIS — D509 Iron deficiency anemia, unspecified: Secondary | ICD-10-CM | POA: Diagnosis not present

## 2019-07-06 NOTE — Telephone Encounter (Signed)
Called and spoke to patient and patient's wife. Inquired why patient had not previously gone to the emergency room as advised. Patient stated that he does not want to go to [facility] and that the hospital in [place] is "crap."  Patient stated that the Proair inhaler he used it all within 2 weeks and that it does not work and is angry that he couldn't get it refilled. I attempted to educate patient on use of a rescue inhaler and how often it should be used. Patient did not want to hear instructions and stated that "the other albuterol HFA works so much better."  I attempted to tell him that both are albuterol and work the same way but he was not hearing it.  Patient's wife stated that she will get the primary care office to fax [place] the results of his previous two covid tests that show he is negative. Patient's wife also mentioned she thought patient should have a sleep study.  Patient got back on the phone and did share that he had completed previous ordered medications but that he is having the same shortness of breath going on for 3 weeks now.   Advised him at this point he would need to seek emergency care as several providers have suggested this.   Routing message as Juluis Rainier to [name], NP and Dr. Lamonte Sakai

## 2019-07-06 NOTE — Telephone Encounter (Signed)
Primary Pulmonologist: [facility] Last office visit and with whom: 06/14/19 with BPM televisit What do we see them for (pulmonary problems): COPD Last OV assessment/plan:  "Assessment and Plan:  SBO (small bowel obstruction) ([place]) Plan:  Needs outpatient follow up  [place] discharge   COPD with acute exacerbation ([place]) Plan: Prednisone taper today  As discussed with patient over the phone we will hold off on antibiotics at this time as patient just recently completed a course of doxycycline.  If patient starts to develop discolored mucus, fevers, body aches then he will contact our office  COPD (chronic obstructive pulmonary disease) ([place]) Plan: Prednisone taper today Continue Trelegy Ellipta Continue albuterol nebulizers as needed for shortness of breath and wheezing Keep scheduled appointment with our office later this month for pulmonary function testing  I will follow-up with Dr. Lamonte Sakai to see if he would like for you to follow-up directly with him as was previously indicated on his last office note."  Was appointment offered to patient (explain)?  None available today 07/09/19   Reason for call: patient is very fatigued, short of breath even speaking can't get a whole sentence out. O2 sats on phone wre 92% patient not on oxygen. He can't sleep at night because he can't catch his breath and he is sleeping in recliner when he is able.   [name] please advise

## 2019-07-06 NOTE — Telephone Encounter (Signed)
I am sorry to hear that the patient continues to be non compliant with our recomendations. I am struggling to see how we can best manage the patient as he continues to be noncompliant with our suggestions as well has been resistant to receiving care in the [place] area.   The patients symptoms are not improving despite treatments. We have requested covid testing. Pt chose to complete out of our system. We have yet to receive these documented results. Pt was recommended to seek out ER eval last weekend. It was recommended multiple times this week. The patient then told us that he was being transported via ambulance later this week. He now admitted today that this was not accurate.   This continues to be a recurrent issue where the patient calls in the triage expecting telephonic management for difficult complex healthcare issues and breathing problems.  In the setting of a global pandemic it is difficult to manage the symptoms.  We have been requesting records from the patient's previous Covid testing.  He reports that he has had 2.  We have yet to receive formal documentation showing this.  We have received an office note from his primary care provider where they put "patient believes he was negative".  This is yet another barrier that the patient refused to come to [facility] for Covid testing where we could easily view these results so we have another barrier to be able to manage his care.  Unfortunately I would love to try to help manage the patient the best that we can but I am struggling to see how we can do that given the barriers he is created for himself.  I will follow-up with Dr. Lamonte Sakai to see if he has any suggestions regarding the best way to proceed forward with management of the patient.  If the patient does contact us back he needs to be scheduled for a televisit to further discuss these interventions versus going back and forth between triage messages where it becomes very difficult to  directly communicate with the patient and answer his questions as well as assess his needs safely.  I am concerned regarding the patient and his acute symptoms.  I am concerned that his symptoms have not improved despite 2 rounds of fluoroquinolones.  I am concerned that we have not received his Covid testing.  I am concerned the patient continues to tell us that he is presenting to an emergency room or the hospital but yet never does.  I am concerned that he told us that he was being transported via ambulance this week which then he admitted today he did not do.    We will be unable to successfully manage the patient and help him if he continues to be noncompliant as well as inaccurate with the information that he is providing for [place].    Since the patient prefers to remain in [place] for all of his interventions it may be beneficial for him to consider finding a pulmonologist and [place].  I will discuss this with Dr. Lamonte Sakai and see if he has any other suggestions on how to best manage this case.  Will route to Dr. Lamonte Sakai, Will route to [facility] as Juluis Rainier.  Wyn Quaker FNP

## 2019-07-06 NOTE — Telephone Encounter (Signed)
Prior to having this message routed to me did we investigate to see if the patient ever did present to the emergency room.  There have been multiple messages addressing patient's symptoms.  All of this recommendations from multiple providers in this office have suggested the patient needs to seek ER evaluation.  There is a documented note from a triage message earlier this week stating the patient was routed to EMS via 911.  Did this happen?  If so where the notes on this?  Was the patient never hospitalized?  There is so much more that needs to be investigated prior to this being evaluated as a triage message.  I am sorry that the patient is not feeling well.  But it is extremely difficult for [place] to manage this telephonically.  The patient has been resistant to comply with our request to seek emergent evaluation.  I am not seeing that he sought emergent evaluation in any of our [facility] facilities.  Wyn Quaker FNP

## 2019-07-07 ENCOUNTER — Other Ambulatory Visit: Payer: Self-pay

## 2019-07-07 ENCOUNTER — Encounter (HOSPITAL_COMMUNITY): Payer: Self-pay | Admitting: Emergency Medicine

## 2019-07-07 ENCOUNTER — Emergency Department (HOSPITAL_COMMUNITY): Payer: Medicare Other

## 2019-07-07 ENCOUNTER — Emergency Department (HOSPITAL_COMMUNITY)
Admission: EM | Admit: 2019-07-07 | Discharge: 2019-07-08 | Disposition: A | Discharge status: Home / Self Care | Payer: Medicare Other | Attending: Internal Medicine | Admitting: Internal Medicine

## 2019-07-07 DIAGNOSIS — C3491 Malignant neoplasm of unspecified part of right bronchus or lung: Secondary | ICD-10-CM | POA: Insufficient documentation

## 2019-07-07 DIAGNOSIS — I10 Essential (primary) hypertension: Secondary | ICD-10-CM | POA: Diagnosis present

## 2019-07-07 DIAGNOSIS — J449 Chronic obstructive pulmonary disease, unspecified: Secondary | ICD-10-CM | POA: Insufficient documentation

## 2019-07-07 DIAGNOSIS — R7989 Other specified abnormal findings of blood chemistry: Secondary | ICD-10-CM | POA: Insufficient documentation

## 2019-07-07 DIAGNOSIS — I4821 Permanent atrial fibrillation: Secondary | ICD-10-CM | POA: Diagnosis present

## 2019-07-07 DIAGNOSIS — Z79899 Other long term (current) drug therapy: Secondary | ICD-10-CM | POA: Insufficient documentation

## 2019-07-07 DIAGNOSIS — Z87891 Personal history of nicotine dependence: Secondary | ICD-10-CM | POA: Diagnosis not present

## 2019-07-07 DIAGNOSIS — R778 Other specified abnormalities of plasma proteins: Secondary | ICD-10-CM

## 2019-07-07 DIAGNOSIS — Z20828 Contact with and (suspected) exposure to other viral communicable diseases: Secondary | ICD-10-CM | POA: Insufficient documentation

## 2019-07-07 DIAGNOSIS — E119 Type 2 diabetes mellitus without complications: Secondary | ICD-10-CM | POA: Diagnosis not present

## 2019-07-07 DIAGNOSIS — Z955 Presence of coronary angioplasty implant and graft: Secondary | ICD-10-CM | POA: Insufficient documentation

## 2019-07-07 DIAGNOSIS — Z7901 Long term (current) use of anticoagulants: Secondary | ICD-10-CM | POA: Insufficient documentation

## 2019-07-07 DIAGNOSIS — I259 Chronic ischemic heart disease, unspecified: Secondary | ICD-10-CM | POA: Insufficient documentation

## 2019-07-07 DIAGNOSIS — R0602 Shortness of breath: Secondary | ICD-10-CM | POA: Diagnosis not present

## 2019-07-07 DIAGNOSIS — Z7984 Long term (current) use of oral hypoglycemic drugs: Secondary | ICD-10-CM | POA: Diagnosis not present

## 2019-07-07 DIAGNOSIS — E782 Mixed hyperlipidemia: Secondary | ICD-10-CM | POA: Diagnosis present

## 2019-07-07 DIAGNOSIS — R0902 Hypoxemia: Secondary | ICD-10-CM | POA: Diagnosis not present

## 2019-07-07 DIAGNOSIS — I4891 Unspecified atrial fibrillation: Secondary | ICD-10-CM | POA: Diagnosis not present

## 2019-07-07 DIAGNOSIS — I2510 Atherosclerotic heart disease of native coronary artery without angina pectoris: Secondary | ICD-10-CM | POA: Diagnosis present

## 2019-07-07 DIAGNOSIS — E088 Diabetes mellitus due to underlying condition with unspecified complications: Secondary | ICD-10-CM | POA: Diagnosis present

## 2019-07-07 LAB — CBC
HCT: 42.9 % (ref 39.0–52.0)
Hemoglobin: 14.3 g/dL (ref 13.0–17.0)
MCH: 30.3 pg (ref 26.0–34.0)
MCHC: 33.3 g/dL (ref 30.0–36.0)
MCV: 90.9 fL (ref 80.0–100.0)
Platelets: 165 10*3/uL (ref 150–400)
RBC: 4.72 MIL/uL (ref 4.22–5.81)
RDW: 14.4 % (ref 11.5–15.5)
WBC: 6.7 10*3/uL (ref 4.0–10.5)
nRBC: 0 % (ref 0.0–0.2)

## 2019-07-07 LAB — BASIC METABOLIC PANEL
Anion gap: 12 (ref 5–15)
BUN: 20 mg/dL (ref 8–23)
CO2: 25 mmol/L (ref 22–32)
Calcium: 9.8 mg/dL (ref 8.9–10.3)
Chloride: 102 mmol/L (ref 98–111)
Creatinine, Ser: 1.37 mg/dL — ABNORMAL HIGH (ref 0.61–1.24)
GFR calc Af Amer: 58 mL/min — ABNORMAL LOW (ref >60)
GFR calc non Af Amer: 50 mL/min — ABNORMAL LOW (ref >60)
Glucose, Bld: 119 mg/dL — ABNORMAL HIGH (ref 70–99)
Potassium: 4.1 mmol/L (ref 3.5–5.1)
Sodium: 139 mmol/L (ref 135–145)

## 2019-07-07 LAB — CBG MONITORING, ED: Glucose-Capillary: 97 mg/dL (ref 70–99)

## 2019-07-07 LAB — TROPONIN I (HIGH SENSITIVITY): Troponin I (High Sensitivity): 33 ng/L — ABNORMAL HIGH (ref <18)

## 2019-07-07 MED ORDER — SODIUM CHLORIDE 0.9% FLUSH: 3.0000 mL | Freq: Once | INTRAVENOUS | Status: DC

## 2019-07-07 NOTE — ED Triage Notes (Signed)
Pt to triage via [facility].  Pt reports intermittent SOB episodes x 2 weeks.  PT has hx of COPD.  Took antibiotics and prednisone approx 2 weeks ago with no improvement.  No distress at this time.

## 2019-07-08 ENCOUNTER — Encounter (HOSPITAL_COMMUNITY): Payer: Self-pay | Admitting: Internal Medicine

## 2019-07-08 ENCOUNTER — Emergency Department (HOSPITAL_COMMUNITY): Payer: Medicare Other

## 2019-07-08 DIAGNOSIS — R0602 Shortness of breath: Secondary | ICD-10-CM

## 2019-07-08 HISTORY — DX: Shortness of breath: R06.02

## 2019-07-08 LAB — BRAIN NATRIURETIC PEPTIDE: B Natriuretic Peptide: 87.7 pg/mL (ref 0.0–100.0)

## 2019-07-08 LAB — TROPONIN I (HIGH SENSITIVITY): Troponin I (High Sensitivity): 39 ng/L — ABNORMAL HIGH (ref <18)

## 2019-07-08 LAB — CBG MONITORING, ED: Glucose-Capillary: 143 mg/dL — ABNORMAL HIGH (ref 70–99)

## 2019-07-08 LAB — SARS CORONAVIRUS 2 (TAT 6-24 HRS): SARS Coronavirus 2: NEGATIVE

## 2019-07-08 MED ORDER — FUROSEMIDE 10 MG/ML IJ SOLN: 40.0000 mg | Freq: Once | INTRAMUSCULAR | Status: DC

## 2019-07-08 MED ORDER — IOHEXOL 350 MG/ML SOLN
75.0000 mL | Freq: Once | INTRAVENOUS | Status: AC | PRN
  Administered 2019-07-08: 100 mL via INTRAVENOUS

## 2019-07-08 MED ORDER — ASPIRIN 81 MG PO CHEW
324.0000 mg | CHEWABLE_TABLET | Freq: Once | ORAL | Status: AC
  Administered 2019-07-08: 324 mg via ORAL
  Filled 2019-07-08: qty 4

## 2019-07-08 MED ORDER — AEROCHAMBER PLUS FLO-VU LARGE MISC
1.0000 | Freq: Once | Status: AC
  Administered 2019-07-08: 1

## 2019-07-08 MED ORDER — ALBUTEROL SULFATE HFA 108 (90 BASE) MCG/ACT IN AERS
2.0000 | INHALATION_SPRAY | RESPIRATORY_TRACT | Status: DC | PRN
  Administered 2019-07-08: 2 via RESPIRATORY_TRACT
  Filled 2019-07-08: qty 6.7

## 2019-07-08 NOTE — ED Notes (Signed)
Patient transported to CT 

## 2019-07-08 NOTE — ED Provider Notes (Signed)
[facility]ERGENCY DEPARTMENT Provider Note   CSN: [id number] Arrival date & time: 07/07/19  1911     History   Chief Complaint Chief Complaint  Patient presents with  . Shortness of Breath    HPI Juan Valdez is a 73 y.o. male with a hx of a-fib (on eliquis), CAD s/p stent placement in 2003, COPD, lung cancer s/p resection presents to the Emergency Department complaining of gradual, persistent, progressively worsening shortness of breath onset approximately 3 weeks ago.  Patient reports that he has had worsening dyspnea on exertion and orthopnea over the last several weeks.  He reports within an hour and a half of going to bed he wakes up severely short of breath.  He reports no significant improvement while utilizing his albuterol inhalers.  Patient has had multiple consultations with his pulmonologist as he felt this might initially have been a COPD exacerbation.  Since that time he has been through 2 rounds of fluoroquinolones and prednisone without improvement.  Patient denies central chest pain but reports that his shortness breath is worsening.  His pulmonologist has referred him here to the emergency department.  Patient reports compliance with all of his medications.  No treatments prior to arrival.  Patient denies known Covid contacts.  He denies fever, chills, headache, neck pain, chest pain, jaw pain, nausea, vomiting, diarrhea, weakness, dizziness, syncope.      The history is provided by the patient and medical records. No language interpreter was used.    Past Medical History:  Diagnosis Date  . Anemia   . Atrial fibrillation ([place]) 09/07/2018  . CAD (coronary artery disease)   . Colon polyp   . COPD (chronic obstructive pulmonary disease) ([place])   . Diabetes ([place])   . Diverticulosis   . GERD (gastroesophageal reflux disease)   . Gout   . HLD (hyperlipidemia)   . Lung cancer ([place])   . Perforated bowel [facility])     Patient Active Problem List    Diagnosis Date Noted  . COPD with acute exacerbation ([place]) 06/14/2019  . Allergic rhinitis 05/29/2019  . SBO (small bowel obstruction) ([place]) 05/08/2019  . Generalized abdominal pain 05/06/2019  . Abdominal bloating 05/06/2019  . Abdominal distension 05/06/2019  . Irritable bowel syndrome with constipation 05/06/2019  . Hx of colonic polyps 05/06/2019  . 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 Bilateral recurrent inguinal hernia without obstruction or gangrene 05/06/2019  . Former smoker 03/14/2019  . Orthopnea 01/04/2019  . Medication management 01/04/2019  . 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 COPD (chronic obstructive pulmonary disease) ([place]) 11/23/2018  . Onychomycosis due to dermatophyte 11/16/2018  . Atrial fibrillation ([place]) 09/07/2018  . CAD (coronary artery disease) 09/07/2018  . Essential hypertension 09/07/2018  . Mixed dyslipidemia 09/07/2018  . Diabetes mellitus due to underlying condition with unspecified complications ([place]) 39/11/90    Past Surgical History:  Procedure Laterality Date  . ABDOMINAL SURGERY    . CORONARY ANGIOPLASTY WITH STENT PLACEMENT  2003  . LUNG CANCER SURGERY    . ROTATOR CUFF REPAIR Left   . TONSILLECTOMY     age 61        Home Medications    Prior to Admission medications   Medication Sig Start Date End Date Taking? Authorizing Provider  albuterol (PROVENTIL) (2.5 MG/3ML) 0.083% nebulizer solution Take 3 mLs (2.5 mg total) by nebulization every 6 (six) hours as needed for up to  4 doses for wheezing or shortness of breath. 03/14/19  Yes Lauraine Rinne, NP  allopurinol (ZYLOPRIM) 300 MG tablet Take 300 mg by mouth daily.   Yes [provider]  apixaban (ELIQUIS) 5 MG TABS tablet Take 1 tablet (5 mg total) by mouth 2 (two) times daily. 01/12/19  Yes Revankar, Reita Cliche, MD  atorvastatin (LIPITOR) 40 MG tablet Take 40 mg by mouth daily.   Yes [provider]  Fluticasone-Umeclidin-Vilant (TRELEGY ELLIPTA) 100-62.5-25 MCG/INH AEPB Inhale 1 puff into the lungs daily. 04/04/19  Yes Lauraine Rinne, NP  furosemide (LASIX) 20 MG tablet Take 20 mg (1 Tab) every other day 01/25/19  Yes Revankar, Reita Cliche, MD  glipiZIDE (GLUCOTROL XL) 2.5 MG 24 hr tablet Take 2.5 mg by mouth daily with breakfast.   Yes [provider]  ipratropium (ATROVENT) 0.03 % nasal spray Place 2 sprays into both nostrils every 12 (twelve) hours as needed for rhinitis. 06/01/19  Yes Collene Gobble, MD  metoprolol succinate (TOPROL-XL) 25 MG 24 hr tablet Take 25 mg by mouth daily. 03/30/19  Yes [provider]  nitroGLYCERIN (NITROSTAT) 0.4 MG SL tablet Place 0.4 mg under the tongue every 5 (five) minutes as needed for chest pain.   Yes [provider]  PROAIR HFA 108 (90 Base) MCG/ACT inhaler INHALE 2 PUFFS INTO THE LUNGS EVERY 6 HOURS AS NEEDED FOR WHEEZING OR SHORTNESS OF BREATH 06/19/19  Yes Byrum, Rose Fillers, MD  tamsulosin (FLOMAX) 0.4 MG CAPS capsule Take 0.4 mg by mouth daily after supper.    Yes [provider]  traZODone (DESYREL) 150 MG tablet Take 150 mg by mouth at bedtime.    Yes [provider]  doxycycline (VIBRA-TABS) 100 MG tablet Take 1 tablet (100 mg total) by mouth 2 (two) times daily. Patient not taking: Reported on 07/08/2019 05/29/19   Collene Gobble, MD  levofloxacin (LEVAQUIN) 500 MG tablet Take 1 tablet (500 mg total) by mouth daily. Patient not taking: Reported on 07/08/2019 06/19/19   Lauraine Rinne, NP  predniSONE (DELTASONE) 10 MG tablet Take 4 tablets for 3 days, 3 tablets for 3 days, 2 tablets for 3 days, 1 tablet for 3 days Patient not taking: Reported on 07/08/2019 06/14/19   Lauraine Rinne, NP    Family History Family History  Adopted: Yes    Social History Social History   Tobacco Use  . Smoking status: Former Smoker    Packs/day: 3.00    Years: 57.00    Pack years: 171.00    Types: Cigarettes     Start date: 77    Quit date: 06/13/2014    Years since quitting: 5.0  . Smokeless tobacco: Never Used  Substance Use Topics  . Alcohol use: Not on file  . Drug use: Not on file     Allergies   Patient has no known allergies.   Review of Systems Review of Systems  Constitutional: Positive for fatigue. Negative for appetite change, diaphoresis, fever and unexpected weight change.  HENT: Negative for mouth sores.   Eyes: Negative for visual disturbance.  Respiratory: Positive for shortness of breath. Negative for cough, chest tightness and wheezing.   Cardiovascular: Negative for chest pain.  Gastrointestinal: Negative for abdominal pain, constipation, diarrhea, nausea and vomiting.  Endocrine: Negative for polydipsia, polyphagia and polyuria.  Genitourinary: Negative for dysuria, frequency, hematuria and urgency.  Musculoskeletal: Negative for back pain and neck stiffness.  Skin: Negative for rash.  Allergic/Immunologic: Negative for  immunocompromised state.  Neurological: Negative for syncope, light-headedness and headaches.  Hematological: Does not bruise/bleed easily.  Psychiatric/Behavioral: Negative for sleep disturbance. The patient is not nervous/anxious.      Physical Exam Updated Vital Signs BP 134/81 (BP Location: Right Arm)   Pulse 81   Temp 98.4 F (36.9 C) (Oral)   Resp 16   SpO2 95%   Physical Exam Vitals signs and nursing note reviewed.  Constitutional:      General: He is not in acute distress.    Appearance: He is not diaphoretic.  HENT:     Head: Normocephalic.  Eyes:     General: No scleral icterus.    Conjunctiva/sclera: Conjunctivae normal.  Neck:     Musculoskeletal: Normal range of motion.  Cardiovascular:     Rate and Rhythm: Normal rate and regular rhythm.     Pulses: Normal pulses.          Radial pulses are 2+ on the right side and 2+ on the left side.  Pulmonary:     Effort: No tachypnea, accessory muscle usage, prolonged  expiration, respiratory distress or retractions.     Breath sounds: No stridor. Examination of the right-lower field reveals wheezing. Examination of the left-lower field reveals wheezing. Wheezing present.     Comments: Equal chest rise. No increased work of breathing. Faint expiratory wheezing in the bilateral lower lobes Abdominal:     General: There is no distension.     Palpations: Abdomen is soft.     Tenderness: There is no abdominal tenderness. There is no guarding or rebound.  Musculoskeletal:     Right lower leg: Edema present.     Left lower leg: Edema present.     Comments: Moves all extremities equally and without difficulty.  Trace edema of the bilateral lower extremities  Skin:    General: Skin is warm and dry.     Capillary Refill: Capillary refill takes less than 2 seconds.  Neurological:     Mental Status: He is alert.     GCS: GCS eye subscore is 4. GCS verbal subscore is 5. GCS motor subscore is 6.     Comments: Speech is clear and goal oriented.  Psychiatric:        Mood and Affect: Mood normal.      ED Treatments / Results  Labs (all labs ordered are listed, but only abnormal results are displayed) Labs Reviewed  BASIC METABOLIC PANEL - Abnormal; Notable for the following components:      Result Value   Glucose, Bld 119 (*)    Creatinine, Ser 1.37 (*)    GFR calc non Af Amer 50 (*)    GFR calc Af Amer 58 (*)    All other components within normal limits  TROPONIN I (HIGH SENSITIVITY) - Abnormal; Notable for the following components:   Troponin I (High Sensitivity) 33 (*)    All other components within normal limits  TROPONIN I (HIGH SENSITIVITY) - Abnormal; Notable for the following components:   Troponin I (High Sensitivity) 39 (*)    All other components within normal limits  SARS CORONAVIRUS 2 (TAT 6-24 HRS)  CBC  BRAIN NATRIURETIC PEPTIDE  CBG MONITORING, ED    EKG EKG Interpretation  Date/Time:  Saturday July 07 2019 19:14:24 EDT  Ventricular Rate:  65 PR Interval:    QRS Duration: 128 QT Interval:  412 QTC Calculation: 428 R Axis:   -17 Text Interpretation:  Atrial fibrillation with premature ventricular or aberrantly  conducted complexes Non-specific intra-ventricular conduction block Abnormal ECG No significant change since last tracing since June 2020 Reconfirmed by Pryor Curia [phone]) on 07/08/2019 5:27:12 AM   Radiology Dg Chest 2 View  Result Date: 07/07/2019 CLINICAL DATA:  Shortness of breath EXAM: CHEST - 2 VIEW COMPARISON:  None. FINDINGS: Cardiomegaly. Elevation of the left hemidiaphragm. The visualized skeletal structures are unremarkable. IMPRESSION: 1.  Cardiomegaly without acute abnormality of the lungs. 2.  Elevation of the left hemidiaphragm. Electronically Signed   By: Eddie Candle M.D.   On: 07/07/2019 20:55    Procedures Procedures (including critical care time)  Medications Ordered in ED Medications  sodium chloride flush (NS) 0.9 % injection 3 mL (3 mLs Intravenous Not Given 07/08/19 0547)  albuterol (VENTOLIN HFA) 108 (90 Base) MCG/ACT inhaler 2 puff (has no administration in time range)  AeroChamber Plus Flo-Vu Large MISC 1 each (has no administration in time range)  aspirin chewable tablet 324 mg (324 mg Oral Given 07/08/19 0547)     Initial Impression / Assessment and Plan / ED Course  I have reviewed the triage vital signs and the nursing notes.  Pertinent labs & imaging results that were available during my care of the patient were reviewed by me and considered in my medical decision making (see chart for details).        Presents to the emergency department with worsening shortness of breath over the last 3 weeks.  He has tried multiple treatments for his COPD which have not improved his shortness of breath.  It is worse with exertion and at night.  Patient EKG with left bundle branch block and A. fib.  No evidence of STEMI.  Initial troponin 33 and repeat 39.  Patient takes  Lasix for peripheral edema for which she has been compliant.  He has trace edema on his exam today.  No rhonchi or rails.  No evidence of pulmonary edema on chest x-ray.  He has no history of CHF.  Given positive troponins and persistent shortness of breath that is not improving with COPD treatments will admit for cardiac work-up.   The patient was discussed with and seen by Dr. Leonides Schanz who agrees with the treatment plan.  7:37 AM No return call from hospitalist.  Care transferred to [facility], PA-C for admission.  Final Clinical Impressions(s) / ED Diagnoses   Final diagnoses:  SOB (shortness of breath)  Elevated troponin    ED Discharge Orders    None       Loni Muse Gwenlyn Perking 07/08/19 [place], Delice Bison, DO 07/08/19 [phone]

## 2019-07-08 NOTE — ED Provider Notes (Addendum)
Received patient at signout from [facility].  Refer to provider note for full history and physical examination.  Briefly, patient is a 73 year old male with history of A. fib on Eliquis, CAD, COPD, lung cancer status post resection presenting to the ED for evaluation of progressively worsening shortness of breath, PND, orthopnea for 3 weeks.  Has been following with his pulmonologist outpatient and has had 2 rounds of fluoroquinolones and 2 rounds of steroids without improvement.  Also has trace pitting edema of his lower extremities.  However chest x-ray is mostly clear though does show some cardiomegaly.  BNP is within normal limits.  Serial troponins are mildly elevated.  Awaiting Covid testing and CTA of the chest to rule out PE given persistent shortness of breath and history of lung cancer.  Plan for admission to the hospital for further evaluation and management.  Physical Exam  Constitutional: He is well-developed, well-nourished, and in no distress. No distress.  HENT:  Head: Normocephalic and atraumatic.  Eyes: Conjunctivae are normal.  Neck: Neck supple.  Cardiovascular: Normal rate and regular rhythm.  Pulmonary/Chest: Effort normal and breath sounds normal. No respiratory distress. He has no wheezes.  Speaking in full sentences without difficulty     MDM  Spoke with Dr. Lorin Mercy with [facility] hospitalist service who agrees to assume care of patient and bring him into the hospital for further evaluation and management.    Debroah Baller 07/08/19 [id number]   Addendum: Informed by Dr. Rex Kras that patient does not wish to stay in the hospital or get admitted.  I spoke with Dr. Lorin Mercy who has seen and evaluated the patient in the ED.  They had a long shared decision-making conversation and she feels that discharge would be reasonable at this time as he has stress testing scheduled on Tuesday in 2 days.  On my assessment the patient is standing upright, dressed in his clothes and states  that he is ready to leave.  He denies any shortness of breath at this time.  Lungs clear to auscultation bilaterally.  He is speaking in full sentences without difficulty exhibits no evidence of respiratory distress.  He has close follow-up with his pulmonologist and PCP.  Discussed strict ED return precautions. Patient verbalized understanding of and agreement with plan and is safe for discharge home at this time.    Renita Papa, PA-C 07/08/19 1041    Maudie Flakes, MD 07/08/19 [phone]

## 2019-07-08 NOTE — Discharge Instructions (Signed)
Continue taking your home medications as prescribed.  Follow-up with your pulmonologist and PCP as scheduled.  Go to get your stress test on Tuesday as scheduled.  Return to the emergency department if any concerning signs or symptoms develop such as fevers, worsening pains, shortness of breath, persistent vomiting, or loss of consciousness.

## 2019-07-08 NOTE — Consult Note (Signed)
ER Consult Note   DONTERRIUS SANTUCCI OIZ:[id number] DOB: 20-Aug-1946 DOA: 07/07/2019  PCP: Ronita Hipps, MD Consultants:  [facility] - pulmonology; Revankar - cardiology; Mansouraty - GI Patient coming from:  Home - lives with wife and her sister; NOK: Wife, Vicente Males  Chief Complaint: SOB  HPI: Juan Valdez is a 73 y.o. male with medical history significant of perforated bowel; lung CA; HLD; DM; COPD; CAD; and afib on Eliquis presenting with SOB.  He reports SOB, difficulty breathing.  He does to bed at 10pm and wakes up maybe an hour later SOB.  He will use a neb and then it happens again a few hours later.  He tries to still work around the house.  His troponin was high and so he was given ASA.  He wants to know if he needs to be monitored overnight.  He is scheduled for stress testing on Tuesday.  He feels ok now.  His symptoms are unpredictable - day or night.  He has not had hypoxia.  No cough.  + wheezing.  He has known COPD and "the wheezing isn't much."  No fever.    ED Course:  H/o COPD and lung CA.  Progressively worsening SOB x 3 despite 2 rounds of quinolones and steroids. ?CHF. CXR clear, BNP normal.  HS troponin 33, 39.  CTA pending.  Pulm note in chart.  COVID pending.   Review of Systems: As per HPI; otherwise review of systems reviewed and negative.   Ambulatory Status:  Ambulates without assistance  Past Medical History:  Diagnosis Date   Anemia    Atrial fibrillation ([place]) 09/07/2018   CAD (coronary artery disease)    Colon polyp    COPD (chronic obstructive pulmonary disease) (HCC)    Diabetes (HCC)    Diverticulosis    GERD (gastroesophageal reflux disease)    Gout    HLD (hyperlipidemia)    Lung cancer ([place])    Perforated bowel ([place])     Past Surgical History:  Procedure Laterality Date   ABDOMINAL SURGERY     CORONARY ANGIOPLASTY WITH STENT PLACEMENT  2003   LUNG CANCER SURGERY     ROTATOR CUFF REPAIR Left    TONSILLECTOMY     age 28     Social History   Socioeconomic History   Marital status: Unknown    Spouse name: Not on file   Number of children: 0   Years of education: Not on file   Highest education level: Not on file  Occupational History   Occupation: retired  Scientist, product/process development strain: Not on file   Food insecurity    Worry: Not on file    Inability: Not on Lexicographer needs    Medical: Not on file    Non-medical: Not on file  Tobacco Use   Smoking status: Former Smoker    Packs/day: 3.00    Years: 57.00    Pack years: 171.00    Types: Cigarettes    Start date: 1958    Quit date: 06/13/2014    Years since quitting: 5.0   Smokeless tobacco: Never Used  Substance and Sexual Activity   Alcohol use: Not Currently   Drug use: Never   Sexual activity: Not on file  Lifestyle   Physical activity    Days per week: Not on file    Minutes per session: Not on file   Stress: Not on file  Relationships   Social connections  Talks on phone: Not on file    Gets together: Not on file    Attends religious service: Not on file    Active member of club or organization: Not on file    Attends meetings of clubs or organizations: Not on file    Relationship status: Not on file   Intimate partner violence    Fear of current or ex partner: Not on file    Emotionally abused: Not on file    Physically abused: Not on file    Forced sexual activity: Not on file  Other Topics Concern   Not on file  Social History Narrative   Not on file    No Known Allergies  Family History  Adopted: Yes    Prior to Admission medications   Medication Sig Start Date End Date Taking? Authorizing Provider  albuterol (PROVENTIL) (2.5 MG/3ML) 0.083% nebulizer solution Take 3 mLs (2.5 mg total) by nebulization every 6 (six) hours as needed for up to 4 doses for wheezing or shortness of breath. 03/14/19  Yes Lauraine Rinne, NP  allopurinol (ZYLOPRIM) 300 MG tablet Take 300 mg by  mouth daily.   Yes [provider]  apixaban (ELIQUIS) 5 MG TABS tablet Take 1 tablet (5 mg total) by mouth 2 (two) times daily. 01/12/19  Yes Revankar, Reita Cliche, MD  atorvastatin (LIPITOR) 40 MG tablet Take 40 mg by mouth daily.   Yes [provider]  Fluticasone-Umeclidin-Vilant (TRELEGY ELLIPTA) 100-62.5-25 MCG/INH AEPB Inhale 1 puff into the lungs daily. 04/04/19  Yes Lauraine Rinne, NP  furosemide (LASIX) 20 MG tablet Take 20 mg (1 Tab) every other day 01/25/19  Yes Revankar, Reita Cliche, MD  glipiZIDE (GLUCOTROL XL) 2.5 MG 24 hr tablet Take 2.5 mg by mouth daily with breakfast.   Yes [provider]  ipratropium (ATROVENT) 0.03 % nasal spray Place 2 sprays into both nostrils every 12 (twelve) hours as needed for rhinitis. 06/01/19  Yes Collene Gobble, MD  metoprolol succinate (TOPROL-XL) 25 MG 24 hr tablet Take 25 mg by mouth daily. 03/30/19  Yes [provider]  nitroGLYCERIN (NITROSTAT) 0.4 MG SL tablet Place 0.4 mg under the tongue every 5 (five) minutes as needed for chest pain.   Yes [provider]  PROAIR HFA 108 (90 Base) MCG/ACT inhaler INHALE 2 PUFFS INTO THE LUNGS EVERY 6 HOURS AS NEEDED FOR WHEEZING OR SHORTNESS OF BREATH 06/19/19  Yes Byrum, Rose Fillers, MD  tamsulosin (FLOMAX) 0.4 MG CAPS capsule Take 0.4 mg by mouth daily after supper.    Yes [provider]  traZODone (DESYREL) 150 MG tablet Take 150 mg by mouth at bedtime.    Yes [provider]  doxycycline (VIBRA-TABS) 100 MG tablet Take 1 tablet (100 mg total) by mouth 2 (two) times daily. Patient not taking: Reported on 07/08/2019 05/29/19   Collene Gobble, MD  levofloxacin (LEVAQUIN) 500 MG tablet Take 1 tablet (500 mg total) by mouth daily. Patient not taking: Reported on 07/08/2019 06/19/19   Lauraine Rinne, NP  predniSONE (DELTASONE) 10 MG tablet Take 4 tablets for 3 days, 3 tablets for 3 days, 2 tablets for 3 days, 1 tablet for 3 days Patient not taking: Reported on  07/08/2019 06/14/19   Lauraine Rinne, NP    Physical Exam: Vitals:   07/08/19 0615 07/08/19 0630 07/08/19 0730 07/08/19 0800  BP: (!) 154/85 (!) 161/98 (!) 142/36 135/72  Pulse:  76 69 73  Resp:  17 17 (!)  22  Temp:      TempSrc:      SpO2:  93% 100% 98%      General:  Appears calm and comfortable and is NAD; patient was standing and walking around in the room on RA at the time of my evaluation  Eyes:  PERRL, EOMI, normal lids, iris  ENT: hard of hearing hearing, normal lips & tongue, mmm; edentulous  Neck:  no LAD, masses or thyromegaly  Cardiovascular:  RRR, no m/r/g. No LE edema.   Respiratory:   CTA bilaterally with no wheezes/rales/rhonchi.  Normal respiratory effort.  Abdomen:  soft, NT, ND, NABS  Back:   normal alignment, no CVAT  Skin:  no rash or induration seen on limited exam  Musculoskeletal:  grossly normal tone BUE/BLE, good ROM, no bony abnormality  Psychiatric:  grossly normal mood and affect, speech fluent and appropriate, AOx3  Neurologic:  CN 2-12 grossly intact, moves all extremities in coordinated fashion, sensation intact    Radiological Exams on Admission: Dg Chest 2 View  Result Date: 07/07/2019 CLINICAL DATA:  Shortness of breath EXAM: CHEST - 2 VIEW COMPARISON:  None. FINDINGS: Cardiomegaly. Elevation of the left hemidiaphragm. The visualized skeletal structures are unremarkable. IMPRESSION: 1.  Cardiomegaly without acute abnormality of the lungs. 2.  Elevation of the left hemidiaphragm. Electronically Signed   By: Eddie Candle M.D.   On: 07/07/2019 20:55   Ct Angio Chest Pe W And/or Wo Contrast  Result Date: 07/08/2019 CLINICAL DATA:  Shortness of breath EXAM: CT ANGIOGRAPHY CHEST WITH CONTRAST TECHNIQUE: Multidetector CT imaging of the chest was performed using the standard protocol during bolus administration of intravenous contrast. Multiplanar CT image reconstructions and MIPs were obtained to evaluate the vascular anatomy. CONTRAST:   128mL OMNIPAQUE IOHEXOL 350 MG/ML SOLN COMPARISON:  Chest radiograph July 07, 2019 FINDINGS: Cardiovascular: There is no demonstrable pulmonary embolus. The ascending thoracic aorta has a maximum transverse diameter of 4.0 x 4.0 cm. There is no obvious thoracic aortic dissection. Note that the contrast bolus is not sufficient for dissection assessment. There are foci of aortic atherosclerosis. There is slight calcification in proximal visualized great vessels. No pericardial effusion or pericardial thickening is evident. There are foci of coronary artery calcification at multiple sites. The main pulmonary outflow tract measures 4.0 cm in diameter, enlarged. Mediastinum/Nodes: No thyroid lesions are evident. There are calcified subcarinal lymph nodes. There is also a small calcified right hilar lymph node. There are scattered subcentimeter mediastinal lymph nodes. There is a right precarinal lymph node measuring 1.6 x 1.2 cm. There is a right hilar lymph node measuring 1.4 x 1.1 cm. No esophageal lesions are evident. Lungs/Pleura: There are foci of atelectatic change bilaterally. There is no evident edema or consolidation. No pneumothorax. No pleural effusions are evident. Upper Abdomen: There is reflux of contrast into the inferior vena cava and to a limited extent into hepatic veins. There are foci of upper abdominal aortic and mesenteric vessel atherosclerotic calcification. There is a cyst arising from the upper pole of the left kidney laterally measuring 3.8 x 3.3 cm. Visualized upper abdominal structures otherwise appear unremarkable. Musculoskeletal: There are no blastic or lytic bone lesions. No chest wall lesions are evident. There is upper thoracic levoscoliosis. Review of the MIP images confirms the above findings. IMPRESSION: 1.  No demonstrable pulmonary embolus. 2. Ascending thoracic aorta measures 4.0 x 4.0 cm. There are foci of aortic atherosclerosis as well as great vessel and coronary artery  calcification. No thoracic aortic  dissection is appreciable; the contrast bolus is not sufficient in the aorta to assess meaningfully for potential dissection as a differential consideration. Recommend annual imaging followup by CTA or MRA. This recommendation follows 2010 ACCF/AHA/AATS/ACR/ASA/SCA/SCAI/SIR/STS/SVM Guidelines for the Diagnosis and Management of Patients with Thoracic Aortic Disease. Circulation. 2010; 121: A128-N867. Aortic aneurysm NOS (ICD10-I71.9) 3. Enlargement of the main pulmonary outflow tract, a finding indicative of pulmonary arterial hypertension. 4.  No edema or consolidation.  Mild bibasilar atelectasis. 5. Mildly enlarged precarinal and right hilar lymph nodes of uncertain etiology. Evidence of lymph node calcification at several sites consistent with prior granulomatous disease. 6. Reflux of contrast into the inferior vena cava and hepatic veins, a finding that may be indicative of a degree of increase in right heart pressure. Aortic Atherosclerosis (ICD10-I70.0). Electronically Signed   By: Lowella Grip III M.D.   On: 07/08/2019 09:33    EKG: Independently reviewed.  Afib with rate 65; PVCs; nonspecific ST changes with no evidence of acute ischemia; NSCSLT   Labs on Admission: I have personally reviewed the available labs and imaging studies at the time of the admission.  Pertinent labs:   Glucose 119 BUN 20/Creatinine 1.37/GFR 50 HS troponin 33, 39 Normal CBC BNP 87.7 COVID negative   Assessment/Plan Principal Problem:   SOB (shortness of breath) Active Problems:   Atrial fibrillation (HCC)   CAD (coronary artery disease)   Essential hypertension   Mixed dyslipidemia   Diabetes mellitus due to underlying condition with unspecified complications (HCC)   Non-small cell cancer of right lung (HCC)   COPD (chronic obstructive pulmonary disease) (HCC)   -Patient with h/o cardiac and pulmonary problems -He has been having repeated issues with SOB,  discussed on a number of occasions with pulm -He has been on quinolones x 2 and steroids without relief -He reports episodic SOB sometimes waking him from sleep, sometimes at rest, sometimes with ambulation -COVID negative, labs generally unremarkable -CXR and Chest CT unremarkable other than AAA (known); ASCVD; and pulmonary HTN -The patient was on room air at the time of my evaluation, without symptoms, and was requesting discharge - which seemed reasonable in this circumstance -His troponin was mildly elevated with a negative delta troponin; he had an echo in 2/20; and he is scheduled for stress testing with his cardiologist on Tuesday -Given this short-term cardiac f/u and unremarkable pulm current evaluation, it seemed appropriate to d/c the patient to home with outpatient f/u as planned -Respiratory symptoms appear to be more related to pulm HTN/cardiac disease, but this is not clear at this time -Suggest outpatient PCP/cards/pulm f/u -He will be discharged to home at this time, as per his request.     Note: This patient has been tested and is negative for the novel coronavirus COVID-19.    Karmen Bongo MD [facility]   How to contact the [facility] Attending or Consulting provider [place] or covering provider during after hours [place], for this patient?  1. Check the care team in [facility] and look for a) attending/consulting [facility] provider listed and b) the [facility] team listed 2. Log into [email or web address] and use [place] universal password to access. If you do not have the password, please contact the hospital operator. 3. Locate the [facility] provider you are looking for under [facility] and page to a number that you can be directly reached. 4. If you still have difficulty reaching the provider, please page the [facility] (Director on Call) for the Hospitalists listed on amion  for assistance.   07/08/2019, 11:35 AM

## 2019-07-08 NOTE — ED Notes (Signed)
Patient Alert and oriented to baseline. Stable and ambulatory to baseline. Patient verbalized understanding of the discharge instructions.  Patient belongings were taken by the patient.   

## 2019-07-08 NOTE — ED Provider Notes (Addendum)
Medical screening examination/treatment/procedure(s) were conducted as a shared visit with non-physician practitioner(s) and myself.  I personally evaluated the patient during the encounter.  EKG Interpretation  Date/Time:  Saturday July 07 2019 19:14:24 EDT Ventricular Rate:  65 PR Interval:    QRS Duration: 128 QT Interval:  412 QTC Calculation: 428 R Axis:   -17 Text Interpretation:  Atrial fibrillation with premature ventricular or aberrantly conducted complexes Non-specific intra-ventricular conduction block Abnormal ECG No significant change since last tracing since June 2020 Reconfirmed by Pryor Curia [phone]) on 07/08/2019 5:27:12 AM   Patient is a 73 year old male with history of orthopnea, dyspnea on exertion that is progressively worsening.  No chest pain or chest discomfort.  Recommended to come to the emergency department by his pulmonologist.  Mildly elevated troponins here in the ED.  Chest x-ray shows cardiomegaly without acute abnormality.  Trace edema on exam.  Intermittent expiratory wheezes.  Has been on prednisone and antibiotics twice without relief.  will obtain CTA of the chest to rule out PE given previous history of lung cancer.  Has a stress test scheduled on October 27.    Ward, Delice Bison, DO 07/08/19 [phone]

## 2019-07-09 ENCOUNTER — Telehealth: Payer: Self-pay | Admitting: Cardiology

## 2019-07-09 ENCOUNTER — Encounter: Payer: Self-pay | Admitting: Cardiology

## 2019-07-09 ENCOUNTER — Telehealth: Payer: Self-pay | Admitting: Emergency Medicine

## 2019-07-09 MED ORDER — PROAIR HFA 108 (90 BASE) MCG/ACT IN AERS: INHALATION_SPRAY | RESPIRATORY_TRACT | 5 refills | Status: DC

## 2019-07-09 MED ORDER — ALBUTEROL SULFATE HFA 108 (90 BASE) MCG/ACT IN AERS: 2.0000 | INHALATION_SPRAY | Freq: Four times a day (QID) | RESPIRATORY_TRACT | 5 refills | Status: DC | PRN

## 2019-07-09 NOTE — Telephone Encounter (Signed)
Spoke with pt. States that he went to the ED over the weekend due to his breathing. Several tests were done and it was found out that it's his heart causing issues. Pt also needs a refill on Proventil as he was given this medication in the ED and it worked better than [employer]. Nothing further was needed.

## 2019-07-09 NOTE — Telephone Encounter (Signed)
He was seen in the ED at [facility] 10/24, COVID-19 testing negative.  He continues to have exertional dyspnea. He is to see Cardiology in [facility] tomorrow, states that he is going to have a stress test.   We have PFT and an OV with me scheduled for December. I've asked him to continue his same BD regimen until then. We will wait to see what the cards eval reveals.

## 2019-07-09 NOTE — Telephone Encounter (Signed)
Patient was in [facility] and his enzymes were elevated. He is scheduled for a stress test tomorrow and is asking of there is a medication to help bring his cardiac enzymes down. Please advise.

## 2019-07-10 ENCOUNTER — Ambulatory Visit (INDEPENDENT_AMBULATORY_CARE_PROVIDER_SITE_OTHER): Payer: Medicare Other

## 2019-07-10 ENCOUNTER — Other Ambulatory Visit: Payer: Self-pay

## 2019-07-10 DIAGNOSIS — I4819 Other persistent atrial fibrillation: Secondary | ICD-10-CM

## 2019-07-10 DIAGNOSIS — I10 Essential (primary) hypertension: Secondary | ICD-10-CM | POA: Diagnosis not present

## 2019-07-10 DIAGNOSIS — E782 Mixed hyperlipidemia: Secondary | ICD-10-CM

## 2019-07-10 DIAGNOSIS — E088 Diabetes mellitus due to underlying condition with unspecified complications: Secondary | ICD-10-CM | POA: Diagnosis not present

## 2019-07-10 LAB — MYOCARDIAL PERFUSION IMAGING
LV dias vol: 182 mL (ref 62–150)
LV sys vol: 97 mL
Peak HR: 80 {beats}/min
Rest HR: 73 {beats}/min
SDS: 3
SRS: 12
SSS: 15
TID: 0.84

## 2019-07-10 MED ORDER — REGADENOSON 0.4 MG/5ML IV SOLN
0.4000 mg | Freq: Once | INTRAVENOUS | Status: AC
  Administered 2019-07-10: 0.4 mg via INTRAVENOUS

## 2019-07-10 MED ORDER — TECHNETIUM TC 99M TETROFOSMIN IV KIT
32.3000 | PACK | Freq: Once | INTRAVENOUS | Status: AC | PRN
  Administered 2019-07-10: 32.3 via INTRAVENOUS

## 2019-07-10 MED ORDER — TECHNETIUM TC 99M TETROFOSMIN IV KIT
11.0000 | PACK | Freq: Once | INTRAVENOUS | Status: AC | PRN
  Administered 2019-07-10: 11 via INTRAVENOUS

## 2019-07-10 NOTE — Telephone Encounter (Signed)
Patient was seen in ED on 07/07/19 and was told his ongoing SOB is due to elevated cardiac enzymes and that is why he is asking about medication. He is completing lexiscan today.

## 2019-07-10 NOTE — Telephone Encounter (Signed)
There is no such medicine. Just let him make the team aware there of this

## 2019-07-11 ENCOUNTER — Telehealth: Payer: Self-pay | Admitting: Cardiology

## 2019-07-11 ENCOUNTER — Telehealth: Payer: Self-pay | Admitting: Emergency Medicine

## 2019-07-11 ENCOUNTER — Telehealth: Payer: Self-pay

## 2019-07-11 NOTE — Telephone Encounter (Signed)
-----   Message from Jenean Lindau, MD sent at 07/11/2019  8:49 AM EDT ----- Needs appointment to discuss these findings.  Tell him to keep himself well-hydrated.  He does not need to overdo hydration but adequate hydration.  Fasting morning appointment. Jenean Lindau, MD 07/11/2019 8:49 AM

## 2019-07-11 NOTE — Telephone Encounter (Signed)
Results relayed to patient, scheduled for f/u on 07/13/19 in [place] with RRR. He will come into office fasting for labs. Patient urged to seek immediate medical attention if symptoms persist or worsen.

## 2019-07-11 NOTE — Telephone Encounter (Signed)
Patient called to see if we had the results from his stress test.  Also wanted to make RRR aware his SOB is not getting better

## 2019-07-11 NOTE — Telephone Encounter (Signed)
Spoke with the pt's spouse and notified of recs per RB  She verbalized understanding and states they will definitely be keeping the cards appt with will report to ED with any of these symptoms

## 2019-07-11 NOTE — Telephone Encounter (Signed)
I reviewed. This was a high risk study for coronary artery disease.  Please inform him that he   1 ABSOLUTELY has to keep the cardiology appointment on Friday.   2 should go to the ED for evaluation if he experiences exertional SOB or CP beyond the known symptoms we have been investigating.

## 2019-07-11 NOTE — Telephone Encounter (Signed)
Noted and will forward to [place] as FYI

## 2019-07-12 DIAGNOSIS — L259 Unspecified contact dermatitis, unspecified cause: Secondary | ICD-10-CM | POA: Diagnosis not present

## 2019-07-13 ENCOUNTER — Ambulatory Visit (INDEPENDENT_AMBULATORY_CARE_PROVIDER_SITE_OTHER): Payer: Medicare Other | Admitting: Cardiology

## 2019-07-13 ENCOUNTER — Other Ambulatory Visit: Payer: Self-pay

## 2019-07-13 ENCOUNTER — Encounter: Payer: Self-pay | Admitting: Cardiology

## 2019-07-13 VITALS — BP 142/64 | HR 72 | Ht 69.0 in | Wt 190.0 lb

## 2019-07-13 DIAGNOSIS — J431 Panlobular emphysema: Secondary | ICD-10-CM | POA: Diagnosis not present

## 2019-07-13 DIAGNOSIS — E088 Diabetes mellitus due to underlying condition with unspecified complications: Secondary | ICD-10-CM | POA: Diagnosis not present

## 2019-07-13 DIAGNOSIS — I4819 Other persistent atrial fibrillation: Secondary | ICD-10-CM | POA: Diagnosis not present

## 2019-07-13 DIAGNOSIS — R0600 Dyspnea, unspecified: Secondary | ICD-10-CM | POA: Diagnosis not present

## 2019-07-13 DIAGNOSIS — R0609 Other forms of dyspnea: Secondary | ICD-10-CM

## 2019-07-13 DIAGNOSIS — I10 Essential (primary) hypertension: Secondary | ICD-10-CM | POA: Diagnosis not present

## 2019-07-13 DIAGNOSIS — Z87891 Personal history of nicotine dependence: Secondary | ICD-10-CM

## 2019-07-13 DIAGNOSIS — E782 Mixed hyperlipidemia: Secondary | ICD-10-CM

## 2019-07-13 HISTORY — DX: Dyspnea, unspecified: R06.00

## 2019-07-13 HISTORY — DX: Other forms of dyspnea: R06.09

## 2019-07-13 NOTE — Patient Instructions (Addendum)
Medication Instructions:  Your physician recommends that you continue on your current medications as directed. Please refer to the Current Medication list given to you today.  *If you need a refill on your cardiac medications before your next appointment, please call your pharmacy*  Lab Work: NONE If you have labs (blood work) drawn today and your tests are completely normal, you will receive your results only by: Marland Kitchen MyChart Message (if you have MyChart) OR . A paper copy in the mail If you have any lab test that is abnormal or we need to change your treatment, we will call you to review the results.  Testing/Procedures:  You had an EKG performed today   YOU ARE SCHEDULED FOR CVD19 screening  At [place], [place] on 07/16/19 at 11:05 PM. You will need to get in the pre-screening line  Your physician has requested that you have a cardiac catheterization. Cardiac catheterization is used to diagnose and/or treat various heart conditions. Doctors may recommend this procedure for a number of different reasons. The most common reason is to evaluate chest pain. Chest pain can be a symptom of coronary artery disease (CAD), and cardiac catheterization can show whether plaque is narrowing or blocking your heart's arteries. This procedure is also used to evaluate the valves, as well as measure the blood flow and oxygen levels in different parts of your heart. For further information please visit [email or web address]. Please follow instruction sheet, as given.     [place], [place] [address] Dept: [phone] Loc: [place]  07/13/2019  You are scheduled for a Cardiac Catheterization on Friday, November 6 with Dr.Harding .  1. Please arrive at the [facility] (Main Entrance A) at [facility]: [address] [place], [place] [address] at  10:00 AM (This time is two hours before your procedure to ensure your preparation). Free valet parking service is available.   Special note: Every effort is made to have your procedure done on time. Please understand that emergencies sometimes delay scheduled procedures.  2. Diet: Do not eat solid foods after midnight.  The patient may have clear liquids until 5am upon the day of the procedure.  3. Labs: DONE 4. Medication instructions in preparation for your procedure:   Contrast Allergy: No  Stop taking Eliquis (Apixiban) on Wednesday, November 4.  Stop taking, Lasix (Furosemide)  Friday, November 6,  Do not take Diabetes Med glipizide on the day of the procedure and HOLD [place].  On the morning of your procedure, take your Aspirin and any morning medicines NOT listed above.  You may use sips of water.  5. Plan for one night stay--bring personal belongings. 6. Bring a current list of your medications and current insurance cards. 7. You MUST have a responsible person to drive you home. 8. Someone MUST be with you the first 24 hours after you arrive home or your discharge will be delayed. 9. Please wear clothes that are easy to get on and off and wear slip-on shoes.  Thank you for allowing [place] to care for you!   -- [facility]   Follow-Up: At [facility], you and your health needs are our priority.  As part of our continuing mission to provide you with exceptional heart care, we have created designated Provider Care Teams.  These Care Teams include your primary  Cardiologist (physician) and Advanced Practice Providers (APPs -  Physician Assistants and Nurse Practitioners) who all work together to provide you with the care you need, when you need it.  Your next appointment:   1 month  The format for your next appointment:   In Person  Provider:   Jyl Heinz, MD  Other Instructions  Coronary Angiogram With  Stent Coronary angiogram with stent placement is a procedure to widen or open a narrow blood vessel of the heart (coronary artery). Arteries may become blocked by cholesterol buildup (plaques) in the lining of the wall. When a coronary artery becomes partially blocked, blood flow to that area decreases. This may lead to chest pain or a heart attack (myocardial infarction). A stent is a small piece of metal that looks like mesh or a spring. Stent placement may be done as treatment for a heart attack or right after a coronary angiogram in which a blocked artery is found. Let your health care provider know about:  Any allergies you have.  All medicines you are taking, including vitamins, herbs, eye drops, creams, and over-the-counter medicines.  Any problems you or family members have had with anesthetic medicines.  Any blood disorders you have.  Any surgeries you have had.  Any medical conditions you have.  Whether you are pregnant or may be pregnant. What are the risks? Generally, this is a safe procedure. However, problems may occur, including:  Damage to the heart or its blood vessels.  A return of blockage.  Bleeding, infection, or bruising at the insertion site.  A collection of blood under the skin (hematoma) at the insertion site.  A blood clot in another part of the body.  Kidney injury.  Allergic reaction to the dye or contrast that is used.  Bleeding into the abdomen (retroperitoneal bleeding). What happens before the procedure? Staying hydrated Follow instructions from your health care provider about hydration, which may include:  Up to 2 hours before the procedure - you may continue to drink clear liquids, such as water, clear fruit juice, black coffee, and plain tea.  Eating and drinking restrictions Follow instructions from your health care provider about eating and drinking, which may include:  8 hours before the procedure - stop eating heavy meals or foods  such as meat, fried foods, or fatty foods.  6 hours before the procedure - stop eating light meals or foods, such as toast or cereal.  2 hours before the procedure - stop drinking clear liquids. Ask your health care provider about:  Changing or stopping your regular medicines. This is especially important if you are taking diabetes medicines or blood thinners.  Taking medicines such as ibuprofen. These medicines can thin your blood. Do not take these medicines before your procedure if your health care provider instructs you not to. Generally, aspirin is recommended before a procedure of passing a small, thin tube (catheter) through a blood vessel and into the heart (cardiac catheterization). What happens during the procedure?   An IV tube will be inserted into one of your veins.  You will be given one or more of the following: ? A medicine to help you relax (sedative). ? A medicine to numb the area where the catheter will be inserted into an artery (local anesthetic).  To reduce your risk of infection: ? Your health care team will wash or sanitize their hands. ? Your skin will be washed with soap. ? Hair may be removed from the area where the catheter will  be inserted.  Using a guide wire, the catheter will be inserted into an artery. The location may be in your groin, in your wrist, or in the fold of your arm (near your elbow).  A type of X-ray (fluoroscopy) will be used to help guide the catheter to the opening of the arteries in the heart.  A dye will be injected into the catheter, and X-rays will be taken. The dye will help to show where any narrowing or blockages are located in the arteries.  A tiny wire will be guided to the blocked spot, and a balloon will be inflated to make the artery wider.  The stent will be expanded and will crush the plaques into the wall of the vessel. The stent will hold the area open and improve the blood flow. Most stents have a drug coating to reduce  the risk of the stent narrowing over time.  The artery may be made wider using a drill, laser, or other tools to remove plaques.  When the blood flow is better, the catheter will be removed. The lining of the artery will grow over the stent, which stays where it was placed. This procedure may vary among health care providers and hospitals. What happens after the procedure?  If the procedure is done through the leg, you will be kept in bed lying flat for about 6 hours. You will be instructed to not bend and not cross your legs.  The insertion site will be checked frequently.  The pulse in your foot or wrist will be checked frequently.  You may have additional blood tests, X-rays, and a test that records the electrical activity of your heart (electrocardiogram, or ECG). This information is not intended to replace advice given to you by your health care provider. Make sure you discuss any questions you have with your health care provider. Document Released: 03/06/2003 Document Revised: 12/09/2017 Document Reviewed: 04/04/2016 Elsevier Patient Education  2020 [employer].

## 2019-07-13 NOTE — Progress Notes (Signed)
Cardiology Office Note:    Date:  07/13/2019   ID:  LANNY LIPKIN, DOB 19-Dec-1945, MRN [id number]  PCP:  Ronita Hipps, MD  Cardiologist:  Jenean Lindau, MD   Referring MD: Ronita Hipps, MD    ASSESSMENT:    1. DOE (dyspnea on exertion)   2. 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 Diabetes mellitus due to underlying condition with unspecified complications ([place])   5. Mixed dyslipidemia   6. Former smoker    PLAN:    In order of problems listed above:  1.  I think the patient's dyspnea on exertion needs to be evaluated.  His pulmonary status appears to be unremarkable.  He is not wheezing.  He is in atrial fibrillation with well-controlled ventricular rate and is on anticoagulation.  In view of this the next step would be coronary angiography left heart catheterization.I discussed coronary angiography and left heart catheterization with the patient at extensive length. Procedure, benefits and potential risks were explained. Patient had multiple questions which were answered to the patient's satisfaction. Patient agreed and consented for the procedure. Further recommendations will be made based on the findings of the coronary angiography. In the interim. The patient has any significant symptoms he knows to go to the nearest emergency room. 2. Essential hypertension: Blood pressure stable 3. Persistent atrial fibrillation: I discussed with the patient atrial fibrillation, disease process. Management and therapy including rate and rhythm control, anticoagulation benefits and potential risks were discussed extensively with the patient. Patient had multiple questions which were answered to patient's satisfaction. 4. Dyslipidemia and diabetes mellitus: Diet was emphasized and he vocalized understanding.  He will be seen in follow-up appointment after the coronary angiography.    Medication Adjustments/Labs and Tests Ordered: Current medicines are reviewed at  length with the patient today.  Concerns regarding medicines are outlined above.  No orders of the defined types were placed in this encounter.  No orders of the defined types were placed in this encounter.    No chief complaint on file.    History of Present Illness:    Juan Valdez is a 73 y.o. male.  Patient has past medical history of coronary artery disease and underwent coronary stenting in the remote past in [place].  He states this was in 2003.  He has history of essential hypertension diabetes mellitus atrial fibrillation and dyslipidemia.  He has been complaining of shortness of breath on exertion and this has been consistently bothering him.  He went to the emergency room the other day and was given antibiotics and pulmonary medications and patient has not made him feel completely better.  The stress test did not reveal any evidence of ischemia but is significantly abnormal and therefore is here for follow-up.  At the time of my evaluation, the patient is alert awake oriented and in no distress.  Past Medical History:  Diagnosis Date  . Anemia   . Atrial fibrillation ([place]) 09/07/2018  . CAD (coronary artery disease)   . Colon polyp   . COPD (chronic obstructive pulmonary disease) ([place])   . Diabetes ([place])   . Diverticulosis   . GERD (gastroesophageal reflux disease)   . Gout   . HLD (hyperlipidemia)   . Lung cancer ([place])   . Perforated bowel [facility])     Past Surgical History:  Procedure Laterality Date  . ABDOMINAL SURGERY    . CORONARY ANGIOPLASTY WITH STENT PLACEMENT  2003  .  LUNG CANCER SURGERY    . ROTATOR CUFF REPAIR Left   . TONSILLECTOMY     age 32    Current Medications: Current Meds  Medication Sig  . albuterol (PROVENTIL) (2.5 MG/3ML) 0.083% nebulizer solution Take 3 mLs (2.5 mg total) by nebulization every 6 (six) hours as needed for up to 4 doses for wheezing or shortness of breath.  Marland Kitchen albuterol (VENTOLIN HFA) 108 (90 Base) MCG/ACT inhaler  Inhale 2 puffs into the lungs every 6 (six) hours as needed for wheezing or shortness of breath.  . allopurinol (ZYLOPRIM) 300 MG tablet Take 300 mg by mouth daily.  Marland Kitchen apixaban (ELIQUIS) 5 MG TABS tablet Take 1 tablet (5 mg total) by mouth 2 (two) times daily.  Marland Kitchen atorvastatin (LIPITOR) 40 MG tablet Take 40 mg by mouth daily.  . furosemide (LASIX) 20 MG tablet Take 20 mg (1 Tab) every other day  . glipiZIDE (GLUCOTROL XL) 2.5 MG 24 hr tablet Take 2.5 mg by mouth daily with breakfast.  . ipratropium (ATROVENT) 0.03 % nasal spray Place 2 sprays into both nostrils every 12 (twelve) hours as needed for rhinitis.  Marland Kitchen nitroGLYCERIN (NITROSTAT) 0.4 MG SL tablet Place 0.4 mg under the tongue every 5 (five) minutes as needed for chest pain.  . tamsulosin (FLOMAX) 0.4 MG CAPS capsule Take 0.4 mg by mouth daily after supper.   . traZODone (DESYREL) 150 MG tablet Take 150 mg by mouth at bedtime.      Allergies:   Patient has no known allergies.   Social History   Socioeconomic History  . Marital status: Unknown    Spouse name: Not on file  . Number of children: 0  . Years of education: Not on file  . Highest education level: Not on file  Occupational History  . Occupation: retired  Scientific laboratory technician  . Financial resource strain: Not on file  . Food insecurity    Worry: Not on file    Inability: Not on file  . Transportation needs    Medical: Not on file    Non-medical: Not on file  Tobacco Use  . Smoking status: Former Smoker    Packs/day: 3.00    Years: 57.00    Pack years: 171.00    Types: Cigarettes    Start date: 69    Quit date: 06/13/2014    Years since quitting: 5.0  . Smokeless tobacco: Never Used  Substance and Sexual Activity  . Alcohol use: Not Currently  . Drug use: Never  . Sexual activity: Not on file  Lifestyle  . Physical activity    Days per week: Not on file    Minutes per session: Not on file  . Stress: Not on file  Relationships  . Social Herbalist on  phone: Not on file    Gets together: Not on file    Attends religious service: Not on file    Active member of club or organization: Not on file    Attends meetings of clubs or organizations: Not on file    Relationship status: Not on file  Other Topics Concern  . Not on file  Social History Narrative  . Not on file     Family History: The patient's family history is not on file. He was adopted.  ROS:   Please see the history of present illness.    All other systems reviewed and are negative.  EKGs/Labs/Other Studies Reviewed:    The following studies were reviewed  today: Study Highlights    Nuclear stress EF: 47%.  The left ventricular ejection fraction is mildly decreased (45-54%).  There was no ST segment deviation noted during stress.  No T wave inversion was noted during stress.  Defect 1: There is a large defect of moderate severity present in the basal inferior, basal inferolateral, mid inferior and mid inferolateral location. There is moderate hypokinesis of the basal to mid inferior, and inferolateral walls.  Findings consistent with prior myocardial infarction with no evidence of peri-infarct ischemia.  This is a high risk study.    The fixed perfusion defect in the basal to mid inferior and inferolateral wall, with wall motion abnormality is suggestive of a myocardial infarction in the RCA territory. Given that this is a large area, the left circumflex could also be affected. The stress ejection fraction is mildly depressed at 47%. Clinically correlate and further diagnostic testing may be warranted.     IMPRESSIONS    1. The left ventricle has mildly reduced systolic function of 84-12%. The cavity size is normal. There is moderately increased left ventricular wall thickness. Left ventricular diastology could not be evaluated secondary to atrial fibrillation.  2. The right ventricle has normal systolic function. The cavity in normal in size. There is no  increase in right ventricular wall thickness.  3. Severely dilated left atrial size.  4. Mildly dilated right atrial size.  5. The mitral valve is degenerative There is moderate thickening. Mitral valve regurgitation is mild to moderate by color flow Doppler.  6. The aortic valve is tricuspid. There is mild thickening of the aortic valve.  7. The aortic root and ascending aorta are normal in size and structure.  8. No evidence of left ventricular regional wall motion abnormalities.    GEN: Patient is in no acute distress HEENT: Normal NECK: No JVD; No carotid bruits LYMPHATICS: No lymphadenopathy CARDIAC: Hear sounds irregular, 2/6 systolic murmur at the apex. RESPIRATORY:  Clear to auscultation without rales, wheezing or rhonchi  ABDOMEN: Soft, non-tender, non-distended MUSCULOSKELETAL:  No edema; No deformity  SKIN: Warm and dry NEUROLOGIC:  Alert and oriented x 3 PSYCHIATRIC:  Normal affect   Signed, Jenean Lindau, MD  07/13/2019 8:31 AM    [place]

## 2019-07-13 NOTE — H&P (View-Only) (Signed)
Cardiology Office Note:    Date:  07/13/2019   ID:  Juan Valdez, DOB 12-20-1945, MRN [id number]  PCP:  Ronita Hipps, MD  Cardiologist:  Jenean Lindau, MD   Referring MD: Ronita Hipps, MD    ASSESSMENT:    1. DOE (dyspnea on exertion)   2. Essential hypertension   3. Panlobular emphysema ([place])   4. Diabetes mellitus due to underlying condition with unspecified complications ([place])   5. Mixed dyslipidemia   6. Former smoker    PLAN:    In order of problems listed above:  1.  I think the patient's dyspnea on exertion needs to be evaluated.  His pulmonary status appears to be unremarkable.  He is not wheezing.  He is in atrial fibrillation with well-controlled ventricular rate and is on anticoagulation.  In view of this the next step would be coronary angiography left heart catheterization.I discussed coronary angiography and left heart catheterization with the patient at extensive length. Procedure, benefits and potential risks were explained. Patient had multiple questions which were answered to the patient's satisfaction. Patient agreed and consented for the procedure. Further recommendations will be made based on the findings of the coronary angiography. In the interim. The patient has any significant symptoms he knows to go to the nearest emergency room. 2. Essential hypertension: Blood pressure stable 3. Persistent atrial fibrillation: I discussed with the patient atrial fibrillation, disease process. Management and therapy including rate and rhythm control, anticoagulation benefits and potential risks were discussed extensively with the patient. Patient had multiple questions which were answered to patient's satisfaction. 4. Dyslipidemia and diabetes mellitus: Diet was emphasized and he vocalized understanding.  He will be seen in follow-up appointment after the coronary angiography.    Medication Adjustments/Labs and Tests Ordered: Current medicines are reviewed at  length with the patient today.  Concerns regarding medicines are outlined above.  No orders of the defined types were placed in this encounter.  No orders of the defined types were placed in this encounter.    No chief complaint on file.    History of Present Illness:    Juan Valdez is a 73 y.o. male.  Patient has past medical history of coronary artery disease and underwent coronary stenting in the remote past in [place].  He states this was in 2003.  He has history of essential hypertension diabetes mellitus atrial fibrillation and dyslipidemia.  He has been complaining of shortness of breath on exertion and this has been consistently bothering him.  He went to the emergency room the other day and was given antibiotics and pulmonary medications and patient has not made him feel completely better.  The stress test did not reveal any evidence of ischemia but is significantly abnormal and therefore is here for follow-up.  At the time of my evaluation, the patient is alert awake oriented and in no distress.  Past Medical History:  Diagnosis Date  . Anemia   . Atrial fibrillation ([place]) 09/07/2018  . CAD (coronary artery disease)   . Colon polyp   . COPD (chronic obstructive pulmonary disease) ([place])   . Diabetes ([place])   . Diverticulosis   . GERD (gastroesophageal reflux disease)   . Gout   . HLD (hyperlipidemia)   . Lung cancer ([place])   . Perforated bowel [facility])     Past Surgical History:  Procedure Laterality Date  . ABDOMINAL SURGERY    . CORONARY ANGIOPLASTY WITH STENT PLACEMENT  2003  .  LUNG CANCER SURGERY    . ROTATOR CUFF REPAIR Left   . TONSILLECTOMY     age 47    Current Medications: Current Meds  Medication Sig  . albuterol (PROVENTIL) (2.5 MG/3ML) 0.083% nebulizer solution Take 3 mLs (2.5 mg total) by nebulization every 6 (six) hours as needed for up to 4 doses for wheezing or shortness of breath.  Marland Kitchen albuterol (VENTOLIN HFA) 108 (90 Base) MCG/ACT inhaler  Inhale 2 puffs into the lungs every 6 (six) hours as needed for wheezing or shortness of breath.  . allopurinol (ZYLOPRIM) 300 MG tablet Take 300 mg by mouth daily.  Marland Kitchen apixaban (ELIQUIS) 5 MG TABS tablet Take 1 tablet (5 mg total) by mouth 2 (two) times daily.  Marland Kitchen atorvastatin (LIPITOR) 40 MG tablet Take 40 mg by mouth daily.  . furosemide (LASIX) 20 MG tablet Take 20 mg (1 Tab) every other day  . glipiZIDE (GLUCOTROL XL) 2.5 MG 24 hr tablet Take 2.5 mg by mouth daily with breakfast.  . ipratropium (ATROVENT) 0.03 % nasal spray Place 2 sprays into both nostrils every 12 (twelve) hours as needed for rhinitis.  Marland Kitchen nitroGLYCERIN (NITROSTAT) 0.4 MG SL tablet Place 0.4 mg under the tongue every 5 (five) minutes as needed for chest pain.  . tamsulosin (FLOMAX) 0.4 MG CAPS capsule Take 0.4 mg by mouth daily after supper.   . traZODone (DESYREL) 150 MG tablet Take 150 mg by mouth at bedtime.      Allergies:   Patient has no known allergies.   Social History   Socioeconomic History  . Marital status: Unknown    Spouse name: Not on file  . Number of children: 0  . Years of education: Not on file  . Highest education level: Not on file  Occupational History  . Occupation: retired  Scientific laboratory technician  . Financial resource strain: Not on file  . Food insecurity    Worry: Not on file    Inability: Not on file  . Transportation needs    Medical: Not on file    Non-medical: Not on file  Tobacco Use  . Smoking status: Former Smoker    Packs/day: 3.00    Years: 57.00    Pack years: 171.00    Types: Cigarettes    Start date: 76    Quit date: 06/13/2014    Years since quitting: 5.0  . Smokeless tobacco: Never Used  Substance and Sexual Activity  . Alcohol use: Not Currently  . Drug use: Never  . Sexual activity: Not on file  Lifestyle  . Physical activity    Days per week: Not on file    Minutes per session: Not on file  . Stress: Not on file  Relationships  . Social Herbalist on  phone: Not on file    Gets together: Not on file    Attends religious service: Not on file    Active member of club or organization: Not on file    Attends meetings of clubs or organizations: Not on file    Relationship status: Not on file  Other Topics Concern  . Not on file  Social History Narrative  . Not on file     Family History: The patient's family history is not on file. He was adopted.  ROS:   Please see the history of present illness.    All other systems reviewed and are negative.  EKGs/Labs/Other Studies Reviewed:    The following studies were reviewed  today: Study Highlights    Nuclear stress EF: 47%.  The left ventricular ejection fraction is mildly decreased (45-54%).  There was no ST segment deviation noted during stress.  No T wave inversion was noted during stress.  Defect 1: There is a large defect of moderate severity present in the basal inferior, basal inferolateral, mid inferior and mid inferolateral location. There is moderate hypokinesis of the basal to mid inferior, and inferolateral walls.  Findings consistent with prior myocardial infarction with no evidence of peri-infarct ischemia.  This is a high risk study.    The fixed perfusion defect in the basal to mid inferior and inferolateral wall, with wall motion abnormality is suggestive of a myocardial infarction in the RCA territory. Given that this is a large area, the left circumflex could also be affected. The stress ejection fraction is mildly depressed at 47%. Clinically correlate and further diagnostic testing may be warranted.     IMPRESSIONS    1. The left ventricle has mildly reduced systolic function of 19-62%. The cavity size is normal. There is moderately increased left ventricular wall thickness. Left ventricular diastology could not be evaluated secondary to atrial fibrillation.  2. The right ventricle has normal systolic function. The cavity in normal in size. There is no  increase in right ventricular wall thickness.  3. Severely dilated left atrial size.  4. Mildly dilated right atrial size.  5. The mitral valve is degenerative There is moderate thickening. Mitral valve regurgitation is mild to moderate by color flow Doppler.  6. The aortic valve is tricuspid. There is mild thickening of the aortic valve.  7. The aortic root and ascending aorta are normal in size and structure.  8. No evidence of left ventricular regional wall motion abnormalities.    GEN: Patient is in no acute distress HEENT: Normal NECK: No JVD; No carotid bruits LYMPHATICS: No lymphadenopathy CARDIAC: Hear sounds irregular, 2/6 systolic murmur at the apex. RESPIRATORY:  Clear to auscultation without rales, wheezing or rhonchi  ABDOMEN: Soft, non-tender, non-distended MUSCULOSKELETAL:  No edema; No deformity  SKIN: Warm and dry NEUROLOGIC:  Alert and oriented x 3 PSYCHIATRIC:  Normal affect   Signed, Jenean Lindau, MD  07/13/2019 8:31 AM    [place]

## 2019-07-13 NOTE — Telephone Encounter (Signed)
LMTCB for update with patient.

## 2019-07-16 ENCOUNTER — Other Ambulatory Visit (HOSPITAL_COMMUNITY)
Admission: RE | Admit: 2019-07-16 | Discharge: 2019-07-16 | Disposition: A | Discharge status: Home / Self Care | Payer: Medicare Other | Source: Ambulatory Visit | Attending: Cardiology | Admitting: Cardiology

## 2019-07-16 DIAGNOSIS — Z20828 Contact with and (suspected) exposure to other viral communicable diseases: Secondary | ICD-10-CM | POA: Insufficient documentation

## 2019-07-16 DIAGNOSIS — Z01812 Encounter for preprocedural laboratory examination: Secondary | ICD-10-CM | POA: Insufficient documentation

## 2019-07-17 LAB — NOVEL CORONAVIRUS, NAA (HOSP ORDER, SEND-OUT TO REF LAB; TAT 18-24 HRS): SARS-CoV-2, NAA: NOT DETECTED

## 2019-07-19 ENCOUNTER — Telehealth: Payer: Self-pay | Admitting: *Deleted

## 2019-07-19 NOTE — Telephone Encounter (Addendum)
Pt contacted pre-catheterization scheduled at [facility] for: Friday July 20, 2019 12 noon Verified arrival time and place: [facility]) at: 10 AM   No solid food after midnight prior to cath, clear liquids until 5 AM day of procedure. Contrast allergy: no  Hold: Eliquis-pt took last dose PM 07/18/19 Lasix-AM of procedure. Glipizide-AM of procedure  Except hold medications AM meds can be  taken pre-cath with sip of water including: ASA 81 mg   Confirmed patient has responsible adult to drive home post procedure and observe 24 hours after arriving home:yes  Currently, due to Covid-19 pandemic, only one support person will be allowed with patient. Must be the same support person for that patient's entire stay, will be screened and required to wear a mask. They will be asked to wait in the waiting room for the duration of the patient's stay.     COVID-19 Pre-Screening Questions:  . In the past 7 to 10 days have you had a cough,  shortness of breath, headache, congestion, fever (100 or greater) body aches, chills, sore throat, or sudden loss of taste or sense of smell? shortness of breath/not new . Have you been around anyone with known Covid 19? no . Have you been around anyone who is awaiting Covid 19 test results in the past 7 to 10 days? no . Have you been around anyone who has been exposed to Covid 19, or has mentioned symptoms of Covid 19 within the past 7 to 10 days? no   I reviewed procedure/mask/visitor instructions, Covid-19 screening questions with patient, she verbalized understanding, thanked me for call.

## 2019-07-20 ENCOUNTER — Other Ambulatory Visit: Payer: Self-pay

## 2019-07-20 ENCOUNTER — Ambulatory Visit (HOSPITAL_COMMUNITY)
Admission: RE | Admit: 2019-07-20 | Discharge: 2019-07-21 | Disposition: A | Discharge status: Home / Self Care | Payer: Medicare Other | Attending: Cardiology | Admitting: Cardiology

## 2019-07-20 ENCOUNTER — Encounter (HOSPITAL_COMMUNITY)
Admission: RE | Disposition: A | Discharge status: Home / Self Care | Payer: Self-pay | Source: Home / Self Care | Attending: Cardiology

## 2019-07-20 DIAGNOSIS — E119 Type 2 diabetes mellitus without complications: Secondary | ICD-10-CM | POA: Diagnosis not present

## 2019-07-20 DIAGNOSIS — J431 Panlobular emphysema: Secondary | ICD-10-CM

## 2019-07-20 DIAGNOSIS — M109 Gout, unspecified: Secondary | ICD-10-CM | POA: Insufficient documentation

## 2019-07-20 DIAGNOSIS — Z7984 Long term (current) use of oral hypoglycemic drugs: Secondary | ICD-10-CM | POA: Diagnosis not present

## 2019-07-20 DIAGNOSIS — Z87891 Personal history of nicotine dependence: Secondary | ICD-10-CM | POA: Diagnosis not present

## 2019-07-20 DIAGNOSIS — J449 Chronic obstructive pulmonary disease, unspecified: Secondary | ICD-10-CM | POA: Diagnosis not present

## 2019-07-20 DIAGNOSIS — R0602 Shortness of breath: Secondary | ICD-10-CM | POA: Diagnosis present

## 2019-07-20 DIAGNOSIS — I10 Essential (primary) hypertension: Secondary | ICD-10-CM

## 2019-07-20 DIAGNOSIS — K219 Gastro-esophageal reflux disease without esophagitis: Secondary | ICD-10-CM | POA: Insufficient documentation

## 2019-07-20 DIAGNOSIS — Z85118 Personal history of other malignant neoplasm of bronchus and lung: Secondary | ICD-10-CM | POA: Diagnosis not present

## 2019-07-20 DIAGNOSIS — E088 Diabetes mellitus due to underlying condition with unspecified complications: Secondary | ICD-10-CM

## 2019-07-20 DIAGNOSIS — Z7901 Long term (current) use of anticoagulants: Secondary | ICD-10-CM | POA: Insufficient documentation

## 2019-07-20 DIAGNOSIS — I2510 Atherosclerotic heart disease of native coronary artery without angina pectoris: Secondary | ICD-10-CM

## 2019-07-20 DIAGNOSIS — E782 Mixed hyperlipidemia: Secondary | ICD-10-CM | POA: Diagnosis not present

## 2019-07-20 DIAGNOSIS — Z7902 Long term (current) use of antithrombotics/antiplatelets: Secondary | ICD-10-CM | POA: Insufficient documentation

## 2019-07-20 DIAGNOSIS — Z9861 Coronary angioplasty status: Secondary | ICD-10-CM | POA: Diagnosis not present

## 2019-07-20 DIAGNOSIS — I25119 Atherosclerotic heart disease of native coronary artery with unspecified angina pectoris: Secondary | ICD-10-CM

## 2019-07-20 DIAGNOSIS — I4819 Other persistent atrial fibrillation: Secondary | ICD-10-CM | POA: Diagnosis not present

## 2019-07-20 DIAGNOSIS — Z79899 Other long term (current) drug therapy: Secondary | ICD-10-CM | POA: Insufficient documentation

## 2019-07-20 DIAGNOSIS — R0609 Other forms of dyspnea: Secondary | ICD-10-CM

## 2019-07-20 HISTORY — DX: Coronary angioplasty status: Z98.61

## 2019-07-20 HISTORY — PX: LEFT HEART CATH AND CORONARY ANGIOGRAPHY: CATH118249

## 2019-07-20 HISTORY — PX: CORONARY BALLOON ANGIOPLASTY: CATH118233

## 2019-07-20 HISTORY — DX: Atherosclerotic heart disease of native coronary artery with unspecified angina pectoris: I25.119

## 2019-07-20 HISTORY — DX: Atherosclerotic heart disease of native coronary artery without angina pectoris: I25.10

## 2019-07-20 LAB — GLUCOSE, CAPILLARY
Glucose-Capillary: 139 mg/dL — ABNORMAL HIGH (ref 70–99)
Glucose-Capillary: 115 mg/dL — ABNORMAL HIGH (ref 70–99)
Glucose-Capillary: 226 mg/dL — ABNORMAL HIGH (ref 70–99)

## 2019-07-20 LAB — POCT ACTIVATED CLOTTING TIME
Activated Clotting Time: 279 seconds
Activated Clotting Time: 296 seconds

## 2019-07-20 SURGERY — LEFT HEART CATH AND CORONARY ANGIOGRAPHY
Anesthesia: LOCAL

## 2019-07-20 MED ORDER — FENTANYL CITRATE (PF) 100 MCG/2ML IJ SOLN
INTRAMUSCULAR | Status: AC
  Filled 2019-07-20: qty 2

## 2019-07-20 MED ORDER — HYDRALAZINE HCL 20 MG/ML IJ SOLN: 10.0000 mg | INTRAMUSCULAR | Status: AC | PRN

## 2019-07-20 MED ORDER — NITROGLYCERIN 1 MG/10 ML FOR IR/CATH LAB
INTRA_ARTERIAL | Status: DC | PRN
  Administered 2019-07-20: 200 ug via INTRACORONARY

## 2019-07-20 MED ORDER — HEPARIN (PORCINE) IN NACL 1000-0.9 UT/500ML-% IV SOLN
INTRAVENOUS | Status: DC | PRN
  Administered 2019-07-20 (×2): 500 mL

## 2019-07-20 MED ORDER — VERAPAMIL HCL 2.5 MG/ML IV SOLN
INTRAVENOUS | Status: DC | PRN
  Administered 2019-07-20: 14:00:00 10 mL via INTRA_ARTERIAL

## 2019-07-20 MED ORDER — HEPARIN (PORCINE) IN NACL 1000-0.9 UT/500ML-% IV SOLN
INTRAVENOUS | Status: AC
  Filled 2019-07-20: qty 1000

## 2019-07-20 MED ORDER — MIDAZOLAM HCL 2 MG/2ML IJ SOLN
INTRAMUSCULAR | Status: AC
  Filled 2019-07-20: qty 2

## 2019-07-20 MED ORDER — ASPIRIN 81 MG PO CHEW: 81.0000 mg | CHEWABLE_TABLET | ORAL | Status: DC

## 2019-07-20 MED ORDER — FUROSEMIDE 20 MG PO TABS: 20.0000 mg | ORAL_TABLET | Freq: Every day | ORAL | Status: DC

## 2019-07-20 MED ORDER — MIDAZOLAM HCL 2 MG/2ML IJ SOLN
INTRAMUSCULAR | Status: DC | PRN
  Administered 2019-07-20: 2 mg via INTRAVENOUS
  Administered 2019-07-20: 1 mg via INTRAVENOUS

## 2019-07-20 MED ORDER — CLOPIDOGREL BISULFATE 300 MG PO TABS
300.0000 mg | ORAL_TABLET | Freq: Once | ORAL | Status: AC
  Administered 2019-07-21: 08:00:00 300 mg via ORAL
  Filled 2019-07-20: qty 1

## 2019-07-20 MED ORDER — TICAGRELOR 90 MG PO TABS
90.0000 mg | ORAL_TABLET | Freq: Once | ORAL | Status: AC
  Administered 2019-07-20: 90 mg via ORAL
  Filled 2019-07-20: qty 1

## 2019-07-20 MED ORDER — CLOPIDOGREL BISULFATE 75 MG PO TABS
75.0000 mg | ORAL_TABLET | Freq: Every day | ORAL | Status: DC
  Administered 2019-07-21: 75 mg via ORAL
  Filled 2019-07-20: qty 1

## 2019-07-20 MED ORDER — ATORVASTATIN CALCIUM 40 MG PO TABS
40.0000 mg | ORAL_TABLET | Freq: Every day | ORAL | Status: DC
  Administered 2019-07-20: 40 mg via ORAL
  Filled 2019-07-20: qty 1

## 2019-07-20 MED ORDER — ALBUTEROL SULFATE (2.5 MG/3ML) 0.083% IN NEBU
2.5000 mg | INHALATION_SOLUTION | Freq: Four times a day (QID) | RESPIRATORY_TRACT | Status: DC | PRN
  Administered 2019-07-20 – 2019-07-21 (×2): 2.5 mg via RESPIRATORY_TRACT
  Filled 2019-07-20 (×2): qty 3

## 2019-07-20 MED ORDER — METOPROLOL SUCCINATE ER 25 MG PO TB24
25.0000 mg | ORAL_TABLET | Freq: Every day | ORAL | Status: DC
  Filled 2019-07-20: qty 1

## 2019-07-20 MED ORDER — SODIUM CHLORIDE 0.9% FLUSH: 3.0000 mL | INTRAVENOUS | Status: DC | PRN

## 2019-07-20 MED ORDER — NITROGLYCERIN 0.4 MG SL SUBL: 0.4000 mg | SUBLINGUAL_TABLET | SUBLINGUAL | Status: DC | PRN

## 2019-07-20 MED ORDER — FUROSEMIDE 20 MG PO TABS: 20.0000 mg | ORAL_TABLET | ORAL | Status: DC

## 2019-07-20 MED ORDER — SODIUM CHLORIDE 0.9 % IV SOLN: INTRAVENOUS | Status: AC

## 2019-07-20 MED ORDER — APIXABAN 5 MG PO TABS
5.0000 mg | ORAL_TABLET | Freq: Two times a day (BID) | ORAL | Status: DC
  Administered 2019-07-21: 5 mg via ORAL
  Filled 2019-07-20: qty 1

## 2019-07-20 MED ORDER — ALBUTEROL SULFATE HFA 108 (90 BASE) MCG/ACT IN AERS: 2.0000 | INHALATION_SPRAY | Freq: Four times a day (QID) | RESPIRATORY_TRACT | Status: DC | PRN

## 2019-07-20 MED ORDER — ONDANSETRON HCL 4 MG/2ML IJ SOLN: 4.0000 mg | Freq: Four times a day (QID) | INTRAMUSCULAR | Status: DC | PRN

## 2019-07-20 MED ORDER — HEPARIN SODIUM (PORCINE) 1000 UNIT/ML IJ SOLN
INTRAMUSCULAR | Status: DC | PRN
  Administered 2019-07-20: 5000 [IU] via INTRAVENOUS
  Administered 2019-07-20: 3000 [IU] via INTRAVENOUS
  Administered 2019-07-20: 5000 [IU] via INTRAVENOUS

## 2019-07-20 MED ORDER — NITROGLYCERIN 1 MG/10 ML FOR IR/CATH LAB
INTRA_ARTERIAL | Status: AC
  Filled 2019-07-20: qty 10

## 2019-07-20 MED ORDER — SODIUM CHLORIDE 0.9 % IV SOLN: 250.0000 mL | INTRAVENOUS | Status: DC | PRN

## 2019-07-20 MED ORDER — TAMSULOSIN HCL 0.4 MG PO CAPS
0.4000 mg | ORAL_CAPSULE | Freq: Every day | ORAL | Status: DC
  Administered 2019-07-20: 18:00:00 0.4 mg via ORAL
  Filled 2019-07-20: qty 1

## 2019-07-20 MED ORDER — HEPARIN SODIUM (PORCINE) 1000 UNIT/ML IJ SOLN
INTRAMUSCULAR | Status: AC
  Filled 2019-07-20: qty 1

## 2019-07-20 MED ORDER — LIDOCAINE HCL (PF) 1 % IJ SOLN
INTRAMUSCULAR | Status: DC | PRN
  Administered 2019-07-20: 2 mL

## 2019-07-20 MED ORDER — TICAGRELOR 90 MG PO TABS
ORAL_TABLET | ORAL | Status: AC
  Filled 2019-07-20: qty 2

## 2019-07-20 MED ORDER — SODIUM CHLORIDE 0.9 % WEIGHT BASED INFUSION
3.0000 mL/kg/h | INTRAVENOUS | Status: DC
  Administered 2019-07-20: 11:00:00 3 mL/kg/h via INTRAVENOUS

## 2019-07-20 MED ORDER — ASPIRIN 81 MG PO CHEW: 81.0000 mg | CHEWABLE_TABLET | Freq: Every day | ORAL | Status: DC

## 2019-07-20 MED ORDER — VERAPAMIL HCL 2.5 MG/ML IV SOLN
INTRAVENOUS | Status: AC
  Filled 2019-07-20: qty 2

## 2019-07-20 MED ORDER — MORPHINE SULFATE (PF) 2 MG/ML IV SOLN: 2.0000 mg | INTRAVENOUS | Status: DC | PRN

## 2019-07-20 MED ORDER — SODIUM CHLORIDE 0.9% FLUSH
3.0000 mL | Freq: Two times a day (BID) | INTRAVENOUS | Status: DC
  Administered 2019-07-20: 3 mL via INTRAVENOUS

## 2019-07-20 MED ORDER — SODIUM CHLORIDE 0.9% FLUSH
3.0000 mL | Freq: Two times a day (BID) | INTRAVENOUS | Status: DC
  Administered 2019-07-20: 11:00:00 3 mL via INTRAVENOUS

## 2019-07-20 MED ORDER — TICAGRELOR 90 MG PO TABS
ORAL_TABLET | ORAL | Status: DC | PRN
  Administered 2019-07-20: 180 mg via ORAL

## 2019-07-20 MED ORDER — TRAZODONE HCL 150 MG PO TABS
150.0000 mg | ORAL_TABLET | Freq: Every day | ORAL | Status: DC
  Administered 2019-07-20: 22:00:00 150 mg via ORAL
  Filled 2019-07-20: qty 1

## 2019-07-20 MED ORDER — LABETALOL HCL 5 MG/ML IV SOLN: 10.0000 mg | INTRAVENOUS | Status: AC | PRN

## 2019-07-20 MED ORDER — SODIUM CHLORIDE 0.9 % WEIGHT BASED INFUSION: 1.0000 mL/kg/h | INTRAVENOUS | Status: DC

## 2019-07-20 MED ORDER — GLIPIZIDE ER 2.5 MG PO TB24
2.5000 mg | ORAL_TABLET | Freq: Every day | ORAL | Status: DC
  Administered 2019-07-21: 08:00:00 2.5 mg via ORAL
  Filled 2019-07-20: qty 1

## 2019-07-20 MED ORDER — FENTANYL CITRATE (PF) 100 MCG/2ML IJ SOLN
INTRAMUSCULAR | Status: DC | PRN
  Administered 2019-07-20: 25 ug via INTRAVENOUS
  Administered 2019-07-20: 50 ug via INTRAVENOUS

## 2019-07-20 MED ORDER — ACETAMINOPHEN 325 MG PO TABS: 650.0000 mg | ORAL_TABLET | ORAL | Status: DC | PRN

## 2019-07-20 MED ORDER — LIDOCAINE HCL (PF) 1 % IJ SOLN
INTRAMUSCULAR | Status: AC
  Filled 2019-07-20: qty 30

## 2019-07-20 MED ORDER — ALLOPURINOL 300 MG PO TABS
300.0000 mg | ORAL_TABLET | Freq: Every day | ORAL | Status: DC
  Administered 2019-07-21: 300 mg via ORAL
  Filled 2019-07-20: qty 1

## 2019-07-20 MED ORDER — IOHEXOL 350 MG/ML SOLN
INTRAVENOUS | Status: DC | PRN
  Administered 2019-07-20: 100 mL

## 2019-07-20 SURGICAL SUPPLY — 20 items
BALLN EMERGE MR 3.0X12 (BALLOONS) ×2
BALLN WOLVERINE 3.25X6 (BALLOONS) ×2
BALLN ~~LOC~~ EMERGE MR 3.0X8 (BALLOONS) ×2
BALLOON EMERGE MR 3.0X12 (BALLOONS) ×1 IMPLANT
BALLOON WOLVERINE 3.25X6 (BALLOONS) ×1 IMPLANT
BALLOON ~~LOC~~ EMERGE MR 3.0X8 (BALLOONS) ×1 IMPLANT
CATH LAUNCHER 6FR HS (CATHETERS) ×2 IMPLANT
CATH OPTITORQUE TIG 4.0 5F (CATHETERS) ×2 IMPLANT
CATH VISTA GUIDE 6FR XB3.5 (CATHETERS) ×2 IMPLANT
DEVICE RAD COMP TR BAND LRG (VASCULAR PRODUCTS) ×2 IMPLANT
GLIDESHEATH SLEND SS 6F .021 (SHEATH) ×2 IMPLANT
GUIDEWIRE INQWIRE 1.5J.035X260 (WIRE) ×1 IMPLANT
INQWIRE 1.5J .035X260CM (WIRE) ×2
KIT ENCORE 26 ADVANTAGE (KITS) ×2 IMPLANT
KIT HEART LEFT (KITS) ×2 IMPLANT
PACK CARDIAC CATHETERIZATION (CUSTOM PROCEDURE TRAY) ×2 IMPLANT
SHEATH PROBE COVER 6X72 (BAG) ×2 IMPLANT
TRANSDUCER W/STOPCOCK (MISCELLANEOUS) ×2 IMPLANT
TUBING CIL FLEX 10 FLL-RA (TUBING) ×2 IMPLANT
WIRE ASAHI PROWATER 180CM (WIRE) ×2 IMPLANT

## 2019-07-20 NOTE — Interval H&P Note (Signed)
History and Physical Interval Note:  07/20/2019 1:32 PM  Juan Valdez  has presented today for surgery, with the diagnosis of Abnormal nuclear stress test & progressive shortness of breath as angina equivalent.   The various methods of treatment have been discussed with the patient and family. After consideration of risks, benefits and other options for treatment, the patient has consented to  Procedure(s): LEFT HEART CATH AND CORONARY ANGIOGRAPHY (N/A)  PERCUTANEOUS CORONARY INTERVENTION  as a surgical intervention.  The patient's history has been reviewed, patient examined, no change in status, stable for surgery.  I have reviewed the patient's chart and labs.  Questions were answered to the patient's satisfaction.    Cath Lab Visit (complete for each Cath Lab visit)  Clinical Evaluation Leading to the Procedure:   ACS: No.  Non-ACS:    Anginal Classification: CCS III  Anti-ischemic medical therapy: Minimal Therapy (1 class of medications)  Non-Invasive Test Results: Equivocal test results  Prior CABG: No previous CABG    [name]

## 2019-07-21 ENCOUNTER — Other Ambulatory Visit: Payer: Self-pay | Admitting: Cardiology

## 2019-07-21 ENCOUNTER — Other Ambulatory Visit: Payer: Self-pay

## 2019-07-21 ENCOUNTER — Encounter (HOSPITAL_COMMUNITY): Payer: Self-pay | Admitting: Student

## 2019-07-21 DIAGNOSIS — Z9861 Coronary angioplasty status: Secondary | ICD-10-CM | POA: Diagnosis not present

## 2019-07-21 DIAGNOSIS — I10 Essential (primary) hypertension: Secondary | ICD-10-CM | POA: Diagnosis not present

## 2019-07-21 DIAGNOSIS — Z7901 Long term (current) use of anticoagulants: Secondary | ICD-10-CM | POA: Diagnosis not present

## 2019-07-21 DIAGNOSIS — K219 Gastro-esophageal reflux disease without esophagitis: Secondary | ICD-10-CM | POA: Diagnosis not present

## 2019-07-21 DIAGNOSIS — Z7984 Long term (current) use of oral hypoglycemic drugs: Secondary | ICD-10-CM | POA: Diagnosis not present

## 2019-07-21 DIAGNOSIS — I25119 Atherosclerotic heart disease of native coronary artery with unspecified angina pectoris: Secondary | ICD-10-CM | POA: Diagnosis not present

## 2019-07-21 DIAGNOSIS — Z87891 Personal history of nicotine dependence: Secondary | ICD-10-CM | POA: Diagnosis not present

## 2019-07-21 DIAGNOSIS — Z85118 Personal history of other malignant neoplasm of bronchus and lung: Secondary | ICD-10-CM | POA: Diagnosis not present

## 2019-07-21 DIAGNOSIS — E119 Type 2 diabetes mellitus without complications: Secondary | ICD-10-CM | POA: Diagnosis not present

## 2019-07-21 DIAGNOSIS — Z79899 Other long term (current) drug therapy: Secondary | ICD-10-CM | POA: Diagnosis not present

## 2019-07-21 DIAGNOSIS — E782 Mixed hyperlipidemia: Secondary | ICD-10-CM | POA: Diagnosis not present

## 2019-07-21 DIAGNOSIS — J449 Chronic obstructive pulmonary disease, unspecified: Secondary | ICD-10-CM | POA: Diagnosis not present

## 2019-07-21 DIAGNOSIS — I4819 Other persistent atrial fibrillation: Secondary | ICD-10-CM | POA: Diagnosis not present

## 2019-07-21 DIAGNOSIS — M109 Gout, unspecified: Secondary | ICD-10-CM | POA: Diagnosis not present

## 2019-07-21 DIAGNOSIS — Z7902 Long term (current) use of antithrombotics/antiplatelets: Secondary | ICD-10-CM | POA: Diagnosis not present

## 2019-07-21 LAB — BASIC METABOLIC PANEL
Anion gap: 11 (ref 5–15)
BUN: 13 mg/dL (ref 8–23)
CO2: 26 mmol/L (ref 22–32)
Calcium: 9.6 mg/dL (ref 8.9–10.3)
Chloride: 101 mmol/L (ref 98–111)
Creatinine, Ser: 1.34 mg/dL — ABNORMAL HIGH (ref 0.61–1.24)
GFR calc Af Amer: 60 mL/min — ABNORMAL LOW (ref >60)
GFR calc non Af Amer: 51 mL/min — ABNORMAL LOW (ref >60)
Glucose, Bld: 192 mg/dL — ABNORMAL HIGH (ref 70–99)
Potassium: 3.9 mmol/L (ref 3.5–5.1)
Sodium: 138 mmol/L (ref 135–145)

## 2019-07-21 LAB — CBC
HCT: 45.4 % (ref 39.0–52.0)
Hemoglobin: 15.2 g/dL (ref 13.0–17.0)
MCH: 30.1 pg (ref 26.0–34.0)
MCHC: 33.5 g/dL (ref 30.0–36.0)
MCV: 89.9 fL (ref 80.0–100.0)
Platelets: 148 10*3/uL — ABNORMAL LOW (ref 150–400)
RBC: 5.05 MIL/uL (ref 4.22–5.81)
RDW: 14.7 % (ref 11.5–15.5)
WBC: 8.1 10*3/uL (ref 4.0–10.5)
nRBC: 0 % (ref 0.0–0.2)

## 2019-07-21 MED ORDER — CLOPIDOGREL BISULFATE 75 MG PO TABS: 75.0000 mg | ORAL_TABLET | Freq: Every day | ORAL | 3 refills | Status: DC

## 2019-07-21 NOTE — Discharge Instructions (Addendum)
DO NOT TAKE ASPIRIN!!!! WILL ONLY BE ON PLAVIX AND ELIQUIS AT THIS TIME.

## 2019-07-21 NOTE — Progress Notes (Addendum)
CARDIAC REHAB PHASE I   PRE:  Rate/Rhythm: 71 off monitor on arrival  BP:  Supine:   Sitting: 149/71  Standing:    SaO2: 97 RA  MODE:  Ambulation: 440 ft   POST:  Rate/Rhythm: 78  BP:  Supine:   Sitting: 148/78  Standing:    SaO2: 97 RA [id number] On arrival pt off monitor. Pt. Walked 440 feet in hall with three short standing rest stops. His only c/o was of SOB. He states that he has SOB for a month.RA saturations before and after walk 97%. He denies any chest pain. Completed angina and risk factor education. We discussed risk factors, modifications, diabetic heart healthy diet, proper use of sl NTG, calling 911 and Outpt. CRP. He voices understanding, but he thinks that his diet "ins't bad". I tired to discuss good and bad carbs and portion sizes. He seems to have his mind set that he is doing well with his diet and he is not ready to just listen. He needs to make diet changes but somehow needs to be convinced that he could make better choices.Pt wants to do the Outpt. CRP when he has his second procedure and he is considering [place] verses [place].   Rodney Langton RN 07/21/2019 9:43 AM  '

## 2019-07-21 NOTE — Discharge Summary (Signed)
Discharge Summary    Patient ID: Juan Valdez,  MRN: [id number], DOB/AGE: [birth year] 73 y.o.  Admit date: 07/20/2019 Discharge date: 07/21/2019  Primary Care Provider: Ronita Hipps Primary Cardiologist: Jenean Lindau, Juan Valdez   Discharge Diagnoses    Principal Problem:   Coronary artery disease involving native coronary artery of native heart with angina pectoris [facility]) Active Problems:   SOB (shortness of breath)   CAD S/P percutaneous coronary angioplasty    History of Present Illness     Juan Valdez is a 73 y.o. male with past medical history of persistent atrial fibrillation, CAD (s/p prior DES to RCA), HTN, HLD, Type 2 DM and COPD who presented to [facility] on 07/20/2019 for planned cardiac catheterization.   He was last examined by Dr. Geraldo Pitter on 07/13/2019 and reported worsening dyspnea on exertion. His COPD was under good control and his rates were well-controlled with his known atrial fibrillation, therefore a cardiac catheterization was recommended for ischemic evaluation.   Hospital Course     Consultants: None  This was performed by Dr. Ellyn Hack on 07/20/2019 and showed severe 2-vessel CAD with 75% Prox to mid RCA stenosis, 5% ISR along previously placed distal RCA stent, 85% Prox LCx stenosis, and 100% CTO of distal LCx with filled from L--> L collaterals. Also had a long-diffuse 65-75% stenosis along proximal to mid-LAD. EF was preserved at 55-65% and LVEDP was normal. He underwent balloon angioplasty of the RCA lesion but was unable to fully expand the balloon and deliver the stent. The lesion was reduced to 65%. Was recommended to present back for atherectomy of the RCA and LCx which was tentatively scheduled for 08/22/2019. He received a loading dose of Brilinta in the cath lab and it was recommended to switch to Plavix the following morning with 300mg  dose then 75mg  daily.   The following morning, he denied any chest pain but had persistent dyspnea which  was unchanged. Creatinine remained stable at 1.34 and Hgb was 15.2. Was recommended to continue Eliquis and Plavix. ASA was written in the post-cath order set and it was confirmed with Dr. Ellyn Hack to DISCONTINUE ASA given the need for Plavix and Eliquis. Was continued on PTA Toprol-XL and Atorvastatin 40mg  daily.   He has scheduled follow-up on 08/17/2019 with Dr. Geraldo Pitter. A staff message was sent to the [place] office to see if this could be moved up to 2 weeks from now. He was last examined by Dr. Harl Bowie and deemed stable for discharge. Was discharged home in good condition.   _____________  Discharge Vitals Blood pressure 134/78, pulse 63, temperature 97.6 F (36.4 C), temperature source Oral, resp. rate 19, height 5\' 9"  (1.753 m), weight 86.3 kg, SpO2 98 %.  Filed Weights   07/20/19 1034 07/21/19 0428  Weight: 86.2 kg 86.3 kg    Labs & Radiologic Studies     CBC Recent Labs    07/21/19 0313  WBC 8.1  HGB 15.2  HCT 45.4  MCV 89.9  PLT 932*   Basic Metabolic Panel Recent Labs    07/21/19 0313  NA 138  K 3.9  CL 101  CO2 26  GLUCOSE 192*  BUN 13  CREATININE 1.34*  CALCIUM 9.6   Liver Function Tests No results for input(s): AST, ALT, ALKPHOS, BILITOT, PROT, ALBUMIN in the last 72 hours. No results for input(s): LIPASE, AMYLASE in the last 72 hours. Cardiac Enzymes No results for input(s): CKTOTAL, CKMB, CKMBINDEX, TROPONINI in the last  72 hours. BNP Invalid input(s): POCBNP D-Dimer No results for input(s): DDIMER in the last 72 hours. Hemoglobin A1C No results for input(s): HGBA1C in the last 72 hours. Fasting Lipid Panel No results for input(s): CHOL, HDL, LDLCALC, TRIG, CHOLHDL, LDLDIRECT in the last 72 hours. Thyroid Function Tests No results for input(s): TSH, T4TOTAL, T3FREE, THYROIDAB in the last 72 hours.  Invalid input(s): FREET3  Dg Chest 2 View  Result Date: 07/07/2019 CLINICAL DATA:  Shortness of breath EXAM: CHEST - 2 VIEW COMPARISON:  None.  FINDINGS: Cardiomegaly. Elevation of the left hemidiaphragm. The visualized skeletal structures are unremarkable. IMPRESSION: 1.  Cardiomegaly without acute abnormality of the lungs. 2.  Elevation of the left hemidiaphragm. Electronically Signed   By: Eddie Candle M.D.   On: 07/07/2019 20:55   Ct Angio Chest Pe W And/or Wo Contrast  Result Date: 07/08/2019 CLINICAL DATA:  Shortness of breath EXAM: CT ANGIOGRAPHY CHEST WITH CONTRAST TECHNIQUE: Multidetector CT imaging of the chest was performed using the standard protocol during bolus administration of intravenous contrast. Multiplanar CT image reconstructions and MIPs were obtained to evaluate the vascular anatomy. CONTRAST:  181mL OMNIPAQUE IOHEXOL 350 MG/ML SOLN COMPARISON:  Chest radiograph July 07, 2019 FINDINGS: Cardiovascular: There is no demonstrable pulmonary embolus. The ascending thoracic aorta has a maximum transverse diameter of 4.0 x 4.0 cm. There is no obvious thoracic aortic dissection. Note that the contrast bolus is not sufficient for dissection assessment. There are foci of aortic atherosclerosis. There is slight calcification in proximal visualized great vessels. No pericardial effusion or pericardial thickening is evident. There are foci of coronary artery calcification at multiple sites. The main pulmonary outflow tract measures 4.0 cm in diameter, enlarged. Mediastinum/Nodes: No thyroid lesions are evident. There are calcified subcarinal lymph nodes. There is also a small calcified right hilar lymph node. There are scattered subcentimeter mediastinal lymph nodes. There is a right precarinal lymph node measuring 1.6 x 1.2 cm. There is a right hilar lymph node measuring 1.4 x 1.1 cm. No esophageal lesions are evident. Lungs/Pleura: There are foci of atelectatic change bilaterally. There is no evident edema or consolidation. No pneumothorax. No pleural effusions are evident. Upper Abdomen: There is reflux of contrast into the inferior vena  cava and to a limited extent into hepatic veins. There are foci of upper abdominal aortic and mesenteric vessel atherosclerotic calcification. There is a cyst arising from the upper pole of the left kidney laterally measuring 3.8 x 3.3 cm. Visualized upper abdominal structures otherwise appear unremarkable. Musculoskeletal: There are no blastic or lytic bone lesions. No chest wall lesions are evident. There is upper thoracic levoscoliosis. Review of the MIP images confirms the above findings. IMPRESSION: 1.  No demonstrable pulmonary embolus. 2. Ascending thoracic aorta measures 4.0 x 4.0 cm. There are foci of aortic atherosclerosis as well as great vessel and coronary artery calcification. No thoracic aortic dissection is appreciable; the contrast bolus is not sufficient in the aorta to assess meaningfully for potential dissection as a differential consideration. Recommend annual imaging followup by CTA or MRA. This recommendation follows 2010 ACCF/AHA/AATS/ACR/ASA/SCA/SCAI/SIR/STS/SVM Guidelines for the Diagnosis and Management of Patients with Thoracic Aortic Disease. Circulation. 2010; 121: W620-B559. Aortic aneurysm NOS (ICD10-I71.9) 3. Enlargement of the main pulmonary outflow tract, a finding indicative of pulmonary arterial hypertension. 4.  No edema or consolidation.  Mild bibasilar atelectasis. 5. Mildly enlarged precarinal and right hilar lymph nodes of uncertain etiology. Evidence of lymph node calcification at several sites consistent with prior granulomatous disease. 6.  Reflux of contrast into the inferior vena cava and hepatic veins, a finding that may be indicative of a degree of increase in right heart pressure. Aortic Atherosclerosis (ICD10-I70.0). Electronically Signed   By: Lowella Grip III M.D.   On: 07/08/2019 09:33     Diagnostic Studies/Procedures     Cardiac Catheterization: 07/20/2019  Prox RCA to Mid RCA lesion is 75% stenosed.  Scoring balloon angioplasty was performed  using a BALLOON [place] EMERGE MR 3.0X8.  Post intervention, there is a 65% residual stenosis. Unable to fully expand balloon / deliver stent.  Prox Cx lesion is 85% stenosed- calcified.  Dist Cx - 3rd Mrg lesion is 100% stenosed.  Dist RCA Stent is 5% stenosed.  The left ventricular systolic function is normal.  The left ventricular ejection fraction is 55-65% by visual estimate.  There is no mitral valve regurgitation.  There is no aortic valve stenosis.   SUMMARY  Severe 2 Vessel CAD - focal 85% calcified pLCx & 100% CTO of dCx-OM4 (fills L-L collaterals), long-diffuse 65-75% p-m LAD segmental lesion (moderately calcified) with patent dRCA stent. ? PTCA only of p-mRCA - unable to deliver stent (will need Atherctomy)  Normal LVEF & LVEDP   RECOMMENDATION  Will observe overnight post PTCA -- will need Atherectomy based PCI of RCA & LCx - Scheduled for 12/9 1st Case (will need updated H&P upon arrival).  Loaded with Brilinta in Cath Lab - but with Eliquis, will convert to Plavix in AM - 1 more dose Brilinta tonight, 300 mg Plavix in AM then 75 mg BID  Aggressive RF management    Disposition   Pt is being discharged home today in good condition.  Follow-up Plans & Appointments    Follow-up Information    Juan Valdez, Juan Cliche, Juan Valdez Follow up on 08/17/2019.   Specialty: Cardiology Why: Keep scheduled Cardiology follow-up for 08/17/2019 at 8:20 AM. Will see any sooner appointments are available.  Contact information: [place] [address] [phone]          Discharge Instructions    Diet - low sodium heart healthy   Complete by: As directed    Discharge instructions   Complete by: As directed    PLEASE REMEMBER TO BRING ALL OF YOUR MEDICATIONS TO EACH OF YOUR FOLLOW-UP OFFICE VISITS.  PLEASE ATTEND ALL SCHEDULED FOLLOW-UP APPOINTMENTS.   Activity: Increase activity slowly as tolerated. You may shower, but no soaking baths (or swimming) for 1 week. No  driving for 24 hours. No lifting over 10 lbs for 1 week. No sexual activity for 1 week.   You May Return to Work: in 1 week (if applicable)  Wound Care: You may wash cath site gently with soap and water. Keep cath site clean and dry. If you notice pain, swelling, bleeding or pus at your cath site, please call [phone].   Increase activity slowly   Complete by: As directed       Discharge Medications     Medication List    TAKE these medications   albuterol (2.5 MG/3ML) 0.083% nebulizer solution Commonly known as: PROVENTIL Take 3 mLs (2.5 mg total) by nebulization every 6 (six) hours as needed for up to 4 doses for wheezing or shortness of breath.   albuterol 108 (90 Base) MCG/ACT inhaler Commonly known as: VENTOLIN HFA Inhale 2 puffs into the lungs every 6 (six) hours as needed for wheezing or shortness of breath.   allopurinol 300 MG tablet Commonly known as: ZYLOPRIM Take 300  mg by mouth daily.   apixaban 5 MG Tabs tablet Commonly known as: Eliquis Take 1 tablet (5 mg total) by mouth 2 (two) times daily.   atorvastatin 40 MG tablet Commonly known as: LIPITOR Take 40 mg by mouth daily.   clopidogrel 75 MG tablet Commonly known as: PLAVIX Take 1 tablet (75 mg total) by mouth daily. Start taking on: July 22, 2019   furosemide 20 MG tablet Commonly known as: Lasix Take 20 mg (1 Tab) every other day   glipiZIDE 2.5 MG 24 hr tablet Commonly known as: GLUCOTROL XL Take 2.5 mg by mouth daily with breakfast.   ipratropium 0.03 % nasal spray Commonly known as: ATROVENT Place 2 sprays into both nostrils every 12 (twelve) hours as needed for rhinitis.   metoprolol succinate 25 MG 24 hr tablet Commonly known as: TOPROL-XL Take 25 mg by mouth daily.   nitroGLYCERIN 0.4 MG SL tablet Commonly known as: NITROSTAT Place 0.4 mg under the tongue every 5 (five) minutes as needed for chest pain.   tamsulosin 0.4 MG Caps capsule Commonly known as: FLOMAX Take 0.4 mg  by mouth daily after supper.   traZODone 150 MG tablet Commonly known as: DESYREL Take 150 mg by mouth at bedtime.         Allergies No Known Allergies   No                               Did the patient have a percutaneous coronary intervention (stent / angioplasty)?:  Yes.     Cath/PCI Registry Performance & Quality Measures: 1. Aspirin prescribed? - No - On Plavix and Eliquis 2. ADP Receptor Inhibitor (Plavix/Clopidogrel, Brilinta/Ticagrelor or Effient/Prasugrel) prescribed (includes medically managed patients)? - Yes 3. High Intensity Statin (Lipitor 40-80mg  or Crestor 20-40mg ) prescribed? - Yes 4. For EF <40%, was ACEI/ARB prescribed? - Not Applicable (EF >/= 08%) 5. For EF <40%, Aldosterone Antagonist (Spironolactone or Eplerenone) prescribed? - Not Applicable (EF >/= 67%) 6. Cardiac Rehab Phase II ordered (Included Medically managed Patients)? - No - Returning for PCI    Outstanding Labs/Studies   Procedure on 08/22/2019 as outlined above.   Duration of Discharge Encounter   Greater than 30 minutes including physician time.  Signed, Erma Heritage, PA-C 07/21/2019, 8:39 AM

## 2019-07-21 NOTE — Progress Notes (Addendum)
Progress Note  Patient Name: Juan Valdez Date of Encounter: 07/21/2019  Primary Cardiologist: Dr Geraldo Pitter  Subjective   Ongoing SOB unchanged  Inpatient Medications    Scheduled Meds: . allopurinol  300 mg Oral Daily  . apixaban  5 mg Oral BID  . aspirin  81 mg Oral Daily  . atorvastatin  40 mg Oral q1800  . clopidogrel  300 mg Oral Once  . [START ON 07/22/2019] clopidogrel  75 mg Oral Daily  . furosemide  20 mg Oral QODAY  . glipiZIDE  2.5 mg Oral Q breakfast  . metoprolol succinate  25 mg Oral Daily  . sodium chloride flush  3 mL Intravenous Q12H  . tamsulosin  0.4 mg Oral QPC supper  . traZODone  150 mg Oral QHS   Continuous Infusions: . sodium chloride     PRN Meds: sodium chloride, acetaminophen, albuterol, morphine injection, nitroGLYCERIN, ondansetron (ZOFRAN) IV, sodium chloride flush   Vital Signs    Vitals:   07/20/19 1839 07/20/19 1934 07/21/19 0426 07/21/19 0428  BP:  136/61 134/78   Pulse:   63   Resp:  19 19   Temp:  97.6 F (36.4 C) 97.6 F (36.4 C)   TempSrc:  Oral Oral   SpO2: 99% 98% 98%   Weight:    86.3 kg  Height:        Intake/Output Summary (Last 24 hours) at 07/21/2019 0740 Last data filed at 07/21/2019 3664 Gross per 24 hour  Intake 375 ml  Output 1175 ml  Net -800 ml   Last 3 Weights 07/21/2019 07/20/2019 07/13/2019  Weight (lbs) 190 lb 4.1 oz 190 lb 190 lb  Weight (kg) 86.3 kg 86.183 kg 86.183 kg      Telemetry     afib- Personally Reviewed  ECG    n/a - Personally Reviewed  Physical Exam   GEN: No acute distress.   Neck: No JVD Cardiac: RRR, no murmurs, rubs, or gallops.  Respiratory: Clear to auscultation bilaterally. GI: Soft, nontender, non-distended  MS: No edema; No deformity. Neuro:  Nonfocal  Psych: Normal affect   Labs    High Sensitivity Troponin:   Recent Labs  Lab 07/07/19 1931 07/07/19 2230  TROPONINIHS 33* 39*      Chemistry Recent Labs  Lab 07/21/19 0313  NA 138  K 3.9  CL 101   CO2 26  GLUCOSE 192*  BUN 13  CREATININE 1.34*  CALCIUM 9.6  GFRNONAA 51*  GFRAA 60*  ANIONGAP 11     Hematology Recent Labs  Lab 07/21/19 0313  WBC 8.1  RBC 5.05  HGB 15.2  HCT 45.4  MCV 89.9  MCH 30.1  MCHC 33.5  RDW 14.7  PLT 148*    BNPNo results for input(s): BNP, PROBNP in the last 168 hours.   DDimer No results for input(s): DDIMER in the last 168 hours.   Radiology    No results found.  Cardiac Studies    Patient Profile     73 y.o. male presented for outpatient cath for DOE.   Assessment & Plan    1. DOE - referred by Dr Geraldo Pitter for outpatient cath for Juan Valdez - cath with LM patent, LAD patent, prox LCX 85%, distal LCX 100%, prox RCA 75%.  - had ptca only of RCA, unable to deliver stent. Plan for atheretctomy of RCA and LCX for 12/9 - per interventional stop ASA, manage with just eliquis and plavix.    2. Peristsent Afib -  rate controlled, no symptoms  We will discharge today   For questions or updates, please contact [place] Please consult [email or web address] for contact info under        Signed, Carlyle Dolly, MD  07/21/2019, 7:40 AM

## 2019-07-23 ENCOUNTER — Other Ambulatory Visit: Payer: Self-pay

## 2019-07-23 ENCOUNTER — Encounter (HOSPITAL_COMMUNITY): Payer: Self-pay | Admitting: Cardiology

## 2019-07-23 MED ORDER — NITROGLYCERIN 0.4 MG SL SUBL: SUBLINGUAL_TABLET | SUBLINGUAL | 3 refills | Status: DC

## 2019-07-25 ENCOUNTER — Telehealth: Payer: Self-pay | Admitting: Emergency Medicine

## 2019-07-25 NOTE — Telephone Encounter (Signed)
Left message for patient to call back  

## 2019-07-26 NOTE — Telephone Encounter (Signed)
LMTCB x2 for pt 

## 2019-07-27 NOTE — Telephone Encounter (Signed)
LMTCB x3 for pt. We have attempted to contact pt several times with no success or call back from pt. Per triage protocol, message will be closed.   

## 2019-08-02 DIAGNOSIS — L918 Other hypertrophic disorders of the skin: Secondary | ICD-10-CM | POA: Diagnosis not present

## 2019-08-02 DIAGNOSIS — E119 Type 2 diabetes mellitus without complications: Secondary | ICD-10-CM | POA: Diagnosis not present

## 2019-08-06 ENCOUNTER — Telehealth: Payer: Self-pay | Admitting: Primary Care

## 2019-08-06 ENCOUNTER — Other Ambulatory Visit: Payer: Self-pay

## 2019-08-06 ENCOUNTER — Ambulatory Visit (INDEPENDENT_AMBULATORY_CARE_PROVIDER_SITE_OTHER): Payer: Medicare Other | Admitting: Cardiology

## 2019-08-06 ENCOUNTER — Encounter: Payer: Self-pay | Admitting: Cardiology

## 2019-08-06 VITALS — BP 140/74 | HR 74 | Wt 196.0 lb

## 2019-08-06 DIAGNOSIS — I2510 Atherosclerotic heart disease of native coronary artery without angina pectoris: Secondary | ICD-10-CM

## 2019-08-06 DIAGNOSIS — Z9861 Coronary angioplasty status: Secondary | ICD-10-CM

## 2019-08-06 DIAGNOSIS — E782 Mixed hyperlipidemia: Secondary | ICD-10-CM

## 2019-08-06 DIAGNOSIS — I10 Essential (primary) hypertension: Secondary | ICD-10-CM

## 2019-08-06 NOTE — H&P (View-Only) (Signed)
Cardiology Office Note:    Date:  08/06/2019   ID:  Juan Valdez, DOB 02-23-1946, MRN [id number]  PCP:  Ronita Hipps, MD  Cardiologist:  Jenean Lindau, MD   Referring MD: Ronita Hipps, MD    ASSESSMENT:    1. Coronary artery disease involving native coronary artery of native heart without angina pectoris   2. CAD S/P percutaneous coronary angioplasty   3. Essential hypertension   4. Mixed dyslipidemia    PLAN:    In order of problems listed above:  1. Coronary artery disease: Secondary prevention stressed with the patient.  Importance of compliance with diet and medication stressed and he vocalized understanding.  His coronary angiography report was discussed with him at extensive length and questions were answered to his satisfaction. 2. Essential hypertension: Blood pressure is stable 3. Mixed dyslipidemia: Diet was discussed.  Patient on statin therapy. 4. Patient had blood work done with primary care physician and will get a copy especially to review his renal function. 5. Patient will be seen in follow-up appointment in 2 months or earlier if the patient has any concerns    Medication Adjustments/Labs and Tests Ordered: Current medicines are reviewed at length with the patient today.  Concerns regarding medicines are outlined above.  No orders of the defined types were placed in this encounter.  No orders of the defined types were placed in this encounter.    Chief Complaint  Patient presents with  . 1 month follow up     History of Present Illness:    Juan Valdez is a 73 y.o. male.  Patient has known coronary artery disease.  He recently underwent coronary stenting in a staged fashion and is due to go back for his subsequent intervention.  He denies any problems at this time.  When he walks after a certain distance he has shortness of breath.  No orthopnea or PND.  He is on clopidogrel and anticoagulant for atrial fibrillation.  He denies any chest  pain orthopnea or PND.  At the time of my evaluation, the patient is alert awake oriented and in no distress.  Past Medical History:  Diagnosis Date  . Anemia   . Atrial fibrillation ([place]) 09/07/2018  . CAD (coronary artery disease)    a. s/p prior DES to RCA b. cath in 07/2019 showing 75% Prox to mid RCA stenosis, 5% ISR along previously placed distal RCA stent, 85% Prox LCx stenosis, and 100% CTO of distal LCx with filled from L--> L collaterals. Also had a long-diffuse 65-75% stenosis along proximal to mid-LAD. Unable to fully expand balloon for PCI of RCA and lesion reduced to 65%. Future atherectotmy of RCA and LCx.   . Colon polyp   . COPD (chronic obstructive pulmonary disease) ([place])   . Diabetes ([place])   . Diverticulosis   . GERD (gastroesophageal reflux disease)   . Gout   . HLD (hyperlipidemia)   . Lung cancer ([place])   . Perforated bowel [facility])     Past Surgical History:  Procedure Laterality Date  . ABDOMINAL SURGERY    . CORONARY ANGIOPLASTY WITH STENT PLACEMENT  2003  . CORONARY BALLOON ANGIOPLASTY N/A 07/20/2019   Procedure: CORONARY BALLOON ANGIOPLASTY;  Surgeon: Leonie Man, MD;  Location: [place] [facility];  Service: Cardiovascular;  Laterality: N/A;  RCA  . LEFT HEART CATH AND CORONARY ANGIOGRAPHY N/A 07/20/2019   Procedure: LEFT HEART CATH AND CORONARY ANGIOGRAPHY;  Surgeon: Leonie Man, MD;  Location: [place] [facility];  Service: Cardiovascular;  Laterality: N/A;  . LUNG CANCER SURGERY    . ROTATOR CUFF REPAIR Left   . TONSILLECTOMY     age 41    Current Medications: Current Meds  Medication Sig  . albuterol (PROVENTIL) (2.5 MG/3ML) 0.083% nebulizer solution Take 3 mLs (2.5 mg total) by nebulization every 6 (six) hours as needed for up to 4 doses for wheezing or shortness of breath.  Marland Kitchen albuterol (VENTOLIN HFA) 108 (90 Base) MCG/ACT inhaler Inhale 2 puffs into the lungs every 6 (six) hours as needed for wheezing or shortness of breath.  . allopurinol  (ZYLOPRIM) 300 MG tablet Take 300 mg by mouth daily.  Marland Kitchen apixaban (ELIQUIS) 5 MG TABS tablet Take 1 tablet (5 mg total) by mouth 2 (two) times daily.  Marland Kitchen atorvastatin (LIPITOR) 40 MG tablet Take 40 mg by mouth daily.  . clopidogrel (PLAVIX) 75 MG tablet Take 1 tablet (75 mg total) by mouth daily.  . furosemide (LASIX) 20 MG tablet Take 20 mg (1 Tab) every other day  . glipiZIDE (GLUCOTROL XL) 2.5 MG 24 hr tablet Take 2.5 mg by mouth daily with breakfast.  . ipratropium (ATROVENT) 0.03 % nasal spray Place 2 sprays into both nostrils every 12 (twelve) hours as needed for rhinitis.  . metoprolol succinate (TOPROL-XL) 25 MG 24 hr tablet Take 12.5 mg by mouth daily.   . nitroGLYCERIN (NITROSTAT) 0.4 MG SL tablet DISSOLVE 1 TABLET UNDER THE TONGUE EVERY 5 MINUTES AS NEEDED FOR CHEST PAIN  . tamsulosin (FLOMAX) 0.4 MG CAPS capsule Take 0.4 mg by mouth daily after supper.   . traZODone (DESYREL) 150 MG tablet Take 150 mg by mouth at bedtime.   . TRELEGY ELLIPTA 100-62.5-25 MCG/INH AEPB Take 1 puff by mouth daily.     Allergies:   Patient has no known allergies.   Social History   Socioeconomic History  . Marital status: Widowed    Spouse name: Not on file  . Number of children: 0  . Years of education: Not on file  . Highest education level: Not on file  Occupational History  . Occupation: retired  Scientific laboratory technician  . Financial resource strain: Not on file  . Food insecurity    Worry: Not on file    Inability: Not on file  . Transportation needs    Medical: Not on file    Non-medical: Not on file  Tobacco Use  . Smoking status: Former Smoker    Packs/day: 3.00    Years: 57.00    Pack years: 171.00    Types: Cigarettes    Start date: 90    Quit date: 06/13/2014    Years since quitting: 5.1  . Smokeless tobacco: Never Used  Substance and Sexual Activity  . Alcohol use: Not Currently  . Drug use: Never  . Sexual activity: Not on file  Lifestyle  . Physical activity    Days per week:  Not on file    Minutes per session: Not on file  . Stress: Not on file  Relationships  . Social Herbalist on phone: Not on file    Gets together: Not on file    Attends religious service: Not on file    Active member of club or organization: Not on file    Attends meetings of clubs or organizations: Not on file    Relationship status: Not on file  Other Topics Concern  . Not on file  Social History Narrative  . Not on file     Family History: The patient's family history is not on file. He was adopted.  ROS:   Please see the history of present illness.    All other systems reviewed and are negative.  EKGs/Labs/Other Studies Reviewed:    The following studies were reviewed today: Study date: 07/20/19  Physicians  Panel Physicians Referring Physician Case Authorizing Physician  Leonie Man, MD (Primary)    Procedures  CORONARY BALLOON ANGIOPLASTY  LEFT HEART CATH AND CORONARY ANGIOGRAPHY  Conclusion    Prox RCA to Mid RCA lesion is 75% stenosed.  Scoring balloon angioplasty was performed using a BALLOON [place] EMERGE MR 3.0X8.  Post intervention, there is a 65% residual stenosis. Unable to fully expand balloon / deliver stent.  Prox Cx lesion is 85% stenosed- calcified.  Dist Cx - 3rd Mrg lesion is 100% stenosed.  Dist RCA Stent is 5% stenosed.  The left ventricular systolic function is normal.  The left ventricular ejection fraction is 55-65% by visual estimate.  There is no mitral valve regurgitation.  There is no aortic valve stenosis.   SUMMARY  Severe 2 Vessel CAD - focal 85% calcified pLCx & 100% CTO of dCx-OM4 (fills L-L collaterals), long-diffuse 65-75% p-m LAD segmental lesion (moderately calcified) with patent dRCA stent. ? PTCA only of p-mRCA - unable to deliver stent (will need Atherctomy)  Normal LVEF & LVEDP   RECOMMENDATION  Will observe overnight post PTCA -- will need Atherectomy based PCI of RCA & LCx - Scheduled for  12/9 1st Case (will need updated H&P upon arrival).  Loaded with Brilinta in Cath Lab - but with Eliquis, will convert to Plavix in AM - 1 more dose Brilinta tonight, 300 mg Plavix in AM then 75 mg BID  Aggressive RF management   [name], MD       Recent Labs: 03/08/2019: TSH 1.080 05/11/2019: ALT 18; Magnesium 1.9 07/08/2019: B Natriuretic Peptide 87.7 07/21/2019: BUN 13; Creatinine, Ser 1.34; Hemoglobin 15.2; Platelets 148; Potassium 3.9; Sodium 138  Recent Lipid Panel    Component Value Date/Time   CHOL 140 03/08/2019 0907   TRIG 49 03/08/2019 0907   HDL 44 03/08/2019 0907   CHOLHDL 3.2 03/08/2019 0907   LDLCALC 86 03/08/2019 0907    Physical Exam:    VS:  BP 140/74   Pulse 74   Wt 196 lb (88.9 kg)   SpO2 97%   BMI 28.94 kg/m     Wt Readings from Last 3 Encounters:  08/06/19 196 lb (88.9 kg)  07/21/19 190 lb 4.1 oz (86.3 kg)  07/13/19 190 lb (86.2 kg)     GEN: Patient is in no acute distress HEENT: Normal NECK: No JVD; No carotid bruits LYMPHATICS: No lymphadenopathy CARDIAC: Hear sounds regular, 2/6 systolic murmur at the apex. RESPIRATORY:  Clear to auscultation without rales, wheezing or rhonchi  ABDOMEN: Soft, non-tender, non-distended MUSCULOSKELETAL:  No edema; No deformity  SKIN: Warm and dry NEUROLOGIC:  Alert and oriented x 3 PSYCHIATRIC:  Normal affect   Signed, Jenean Lindau, MD  08/06/2019 4:16 PM    [facility]

## 2019-08-06 NOTE — Telephone Encounter (Signed)
I would wait and see how he is doing. If feeling ok, ok to proceed. If not we can reschedule.

## 2019-08-06 NOTE — Patient Instructions (Signed)
Medication Instructions:  Your physician recommends that you continue on your current medications as directed. Please refer to the Current Medication list given to you today.  *If you need a refill on your cardiac medications before your next appointment, please call your pharmacy*  Lab Work: None ordered If you have labs (blood work) drawn today and your tests are completely normal, you will receive your results only by: Marland Kitchen MyChart Message (if you have MyChart) OR . A paper copy in the mail If you have any lab test that is abnormal or we need to change your treatment, we will call you to review the results.  Testing/Procedures: None ordered  Follow-Up: At [facility], you and your health needs are our priority.  As part of our continuing mission to provide you with exceptional heart care, we have created designated Provider Care Teams.  These Care Teams include your primary Cardiologist (physician) and Advanced Practice Providers (APPs -  Physician Assistants and Nurse Practitioners) who all work together to provide you with the care you need, when you need it.  Your next appointment:   2 month(s)  The format for your next appointment:   In Person  Provider:   You will see Jenean Lindau, MD.  Or, you can be scheduled with the following Advanced Practice Provider on your designated Care Team (at our [facility]):  Laurann Montana, FNP

## 2019-08-06 NOTE — Progress Notes (Signed)
Cardiology Office Note:    Date:  08/06/2019   ID:  SOHUM DELILLO, DOB 09-Dec-1945, MRN [id number]  PCP:  Ronita Hipps, MD  Cardiologist:  Jenean Lindau, MD   Referring MD: Ronita Hipps, MD    ASSESSMENT:    1. Coronary artery disease involving native coronary artery of native heart without angina pectoris   2. CAD S/P percutaneous coronary angioplasty   3. Essential hypertension   4. Mixed dyslipidemia    PLAN:    In order of problems listed above:  1. Coronary artery disease: Secondary prevention stressed with the patient.  Importance of compliance with diet and medication stressed and he vocalized understanding.  His coronary angiography report was discussed with him at extensive length and questions were answered to his satisfaction. 2. Essential hypertension: Blood pressure is stable 3. Mixed dyslipidemia: Diet was discussed.  Patient on statin therapy. 4. Patient had blood work done with primary care physician and will get a copy especially to review his renal function. 5. Patient will be seen in follow-up appointment in 2 months or earlier if the patient has any concerns    Medication Adjustments/Labs and Tests Ordered: Current medicines are reviewed at length with the patient today.  Concerns regarding medicines are outlined above.  No orders of the defined types were placed in this encounter.  No orders of the defined types were placed in this encounter.    Chief Complaint  Patient presents with  . 1 month follow up     History of Present Illness:    Juan Valdez is a 73 y.o. male.  Patient has known coronary artery disease.  He recently underwent coronary stenting in a staged fashion and is due to go back for his subsequent intervention.  He denies any problems at this time.  When he walks after a certain distance he has shortness of breath.  No orthopnea or PND.  He is on clopidogrel and anticoagulant for atrial fibrillation.  He denies any chest  pain orthopnea or PND.  At the time of my evaluation, the patient is alert awake oriented and in no distress.  Past Medical History:  Diagnosis Date  . Anemia   . Atrial fibrillation ([place]) 09/07/2018  . CAD (coronary artery disease)    a. s/p prior DES to RCA b. cath in 07/2019 showing 75% Prox to mid RCA stenosis, 5% ISR along previously placed distal RCA stent, 85% Prox LCx stenosis, and 100% CTO of distal LCx with filled from L--> L collaterals. Also had a long-diffuse 65-75% stenosis along proximal to mid-LAD. Unable to fully expand balloon for PCI of RCA and lesion reduced to 65%. Future atherectotmy of RCA and LCx.   . Colon polyp   . COPD (chronic obstructive pulmonary disease) ([place])   . Diabetes ([place])   . Diverticulosis   . GERD (gastroesophageal reflux disease)   . Gout   . HLD (hyperlipidemia)   . Lung cancer ([place])   . Perforated bowel [facility])     Past Surgical History:  Procedure Laterality Date  . ABDOMINAL SURGERY    . CORONARY ANGIOPLASTY WITH STENT PLACEMENT  2003  . CORONARY BALLOON ANGIOPLASTY N/A 07/20/2019   Procedure: CORONARY BALLOON ANGIOPLASTY;  Surgeon: Leonie Man, MD;  Location: [place] [facility];  Service: Cardiovascular;  Laterality: N/A;  RCA  . LEFT HEART CATH AND CORONARY ANGIOGRAPHY N/A 07/20/2019   Procedure: LEFT HEART CATH AND CORONARY ANGIOGRAPHY;  Surgeon: Leonie Man, MD;  Location: [place] [facility];  Service: Cardiovascular;  Laterality: N/A;  . LUNG CANCER SURGERY    . ROTATOR CUFF REPAIR Left   . TONSILLECTOMY     age 49    Current Medications: Current Meds  Medication Sig  . albuterol (PROVENTIL) (2.5 MG/3ML) 0.083% nebulizer solution Take 3 mLs (2.5 mg total) by nebulization every 6 (six) hours as needed for up to 4 doses for wheezing or shortness of breath.  Marland Kitchen albuterol (VENTOLIN HFA) 108 (90 Base) MCG/ACT inhaler Inhale 2 puffs into the lungs every 6 (six) hours as needed for wheezing or shortness of breath.  . allopurinol  (ZYLOPRIM) 300 MG tablet Take 300 mg by mouth daily.  Marland Kitchen apixaban (ELIQUIS) 5 MG TABS tablet Take 1 tablet (5 mg total) by mouth 2 (two) times daily.  Marland Kitchen atorvastatin (LIPITOR) 40 MG tablet Take 40 mg by mouth daily.  . clopidogrel (PLAVIX) 75 MG tablet Take 1 tablet (75 mg total) by mouth daily.  . furosemide (LASIX) 20 MG tablet Take 20 mg (1 Tab) every other day  . glipiZIDE (GLUCOTROL XL) 2.5 MG 24 hr tablet Take 2.5 mg by mouth daily with breakfast.  . ipratropium (ATROVENT) 0.03 % nasal spray Place 2 sprays into both nostrils every 12 (twelve) hours as needed for rhinitis.  . metoprolol succinate (TOPROL-XL) 25 MG 24 hr tablet Take 12.5 mg by mouth daily.   . nitroGLYCERIN (NITROSTAT) 0.4 MG SL tablet DISSOLVE 1 TABLET UNDER THE TONGUE EVERY 5 MINUTES AS NEEDED FOR CHEST PAIN  . tamsulosin (FLOMAX) 0.4 MG CAPS capsule Take 0.4 mg by mouth daily after supper.   . traZODone (DESYREL) 150 MG tablet Take 150 mg by mouth at bedtime.   . TRELEGY ELLIPTA 100-62.5-25 MCG/INH AEPB Take 1 puff by mouth daily.     Allergies:   Patient has no known allergies.   Social History   Socioeconomic History  . Marital status: Widowed    Spouse name: Not on file  . Number of children: 0  . Years of education: Not on file  . Highest education level: Not on file  Occupational History  . Occupation: retired  Scientific laboratory technician  . Financial resource strain: Not on file  . Food insecurity    Worry: Not on file    Inability: Not on file  . Transportation needs    Medical: Not on file    Non-medical: Not on file  Tobacco Use  . Smoking status: Former Smoker    Packs/day: 3.00    Years: 57.00    Pack years: 171.00    Types: Cigarettes    Start date: 42    Quit date: 06/13/2014    Years since quitting: 5.1  . Smokeless tobacco: Never Used  Substance and Sexual Activity  . Alcohol use: Not Currently  . Drug use: Never  . Sexual activity: Not on file  Lifestyle  . Physical activity    Days per week:  Not on file    Minutes per session: Not on file  . Stress: Not on file  Relationships  . Social Herbalist on phone: Not on file    Gets together: Not on file    Attends religious service: Not on file    Active member of club or organization: Not on file    Attends meetings of clubs or organizations: Not on file    Relationship status: Not on file  Other Topics Concern  . Not on file  Social History Narrative  . Not on file     Family History: The patient's family history is not on file. He was adopted.  ROS:   Please see the history of present illness.    All other systems reviewed and are negative.  EKGs/Labs/Other Studies Reviewed:    The following studies were reviewed today: Study date: 07/20/19  Physicians  Panel Physicians Referring Physician Case Authorizing Physician  Leonie Man, MD (Primary)    Procedures  CORONARY BALLOON ANGIOPLASTY  LEFT HEART CATH AND CORONARY ANGIOGRAPHY  Conclusion    Prox RCA to Mid RCA lesion is 75% stenosed.  Scoring balloon angioplasty was performed using a BALLOON [place] EMERGE MR 3.0X8.  Post intervention, there is a 65% residual stenosis. Unable to fully expand balloon / deliver stent.  Prox Cx lesion is 85% stenosed- calcified.  Dist Cx - 3rd Mrg lesion is 100% stenosed.  Dist RCA Stent is 5% stenosed.  The left ventricular systolic function is normal.  The left ventricular ejection fraction is 55-65% by visual estimate.  There is no mitral valve regurgitation.  There is no aortic valve stenosis.   SUMMARY  Severe 2 Vessel CAD - focal 85% calcified pLCx & 100% CTO of dCx-OM4 (fills L-L collaterals), long-diffuse 65-75% p-m LAD segmental lesion (moderately calcified) with patent dRCA stent. ? PTCA only of p-mRCA - unable to deliver stent (will need Atherctomy)  Normal LVEF & LVEDP   RECOMMENDATION  Will observe overnight post PTCA -- will need Atherectomy based PCI of RCA & LCx - Scheduled for  12/9 1st Case (will need updated H&P upon arrival).  Loaded with Brilinta in Cath Lab - but with Eliquis, will convert to Plavix in AM - 1 more dose Brilinta tonight, 300 mg Plavix in AM then 75 mg BID  Aggressive RF management   [name], MD       Recent Labs: 03/08/2019: TSH 1.080 05/11/2019: ALT 18; Magnesium 1.9 07/08/2019: B Natriuretic Peptide 87.7 07/21/2019: BUN 13; Creatinine, Ser 1.34; Hemoglobin 15.2; Platelets 148; Potassium 3.9; Sodium 138  Recent Lipid Panel    Component Value Date/Time   CHOL 140 03/08/2019 0907   TRIG 49 03/08/2019 0907   HDL 44 03/08/2019 0907   CHOLHDL 3.2 03/08/2019 0907   LDLCALC 86 03/08/2019 0907    Physical Exam:    VS:  BP 140/74   Pulse 74   Wt 196 lb (88.9 kg)   SpO2 97%   BMI 28.94 kg/m     Wt Readings from Last 3 Encounters:  08/06/19 196 lb (88.9 kg)  07/21/19 190 lb 4.1 oz (86.3 kg)  07/13/19 190 lb (86.2 kg)     GEN: Patient is in no acute distress HEENT: Normal NECK: No JVD; No carotid bruits LYMPHATICS: No lymphadenopathy CARDIAC: Hear sounds regular, 2/6 systolic murmur at the apex. RESPIRATORY:  Clear to auscultation without rales, wheezing or rhonchi  ABDOMEN: Soft, non-tender, non-distended MUSCULOSKELETAL:  No edema; No deformity  SKIN: Warm and dry NEUROLOGIC:  Alert and oriented x 3 PSYCHIATRIC:  Normal affect   Signed, Jenean Lindau, MD  08/06/2019 4:16 PM    [facility]

## 2019-08-06 NOTE — Telephone Encounter (Signed)
Called and spoke with pt who stated he has a blockage in his heart with one artery blocked at 100% and another artery blocked at 50%. Pt said he is scheduled to have stents placed 08/22/2019.  Due to this, pt is wanting to know if he should still have the PFT which is scheduled for 08/30/2019. Beth, please advise on this for pt. Thanks!

## 2019-08-07 DIAGNOSIS — M542 Cervicalgia: Secondary | ICD-10-CM | POA: Diagnosis not present

## 2019-08-07 DIAGNOSIS — J449 Chronic obstructive pulmonary disease, unspecified: Secondary | ICD-10-CM | POA: Diagnosis not present

## 2019-08-07 DIAGNOSIS — M1990 Unspecified osteoarthritis, unspecified site: Secondary | ICD-10-CM | POA: Diagnosis not present

## 2019-08-07 NOTE — Telephone Encounter (Signed)
Pt is returning missed call. Would like a call back. If no answer please leave a detailed msg. Stated that he will be going into surgery Dec 9th.

## 2019-08-07 NOTE — Telephone Encounter (Signed)
Attempted to call pt but unable to reach. Left message for pt to return call. 

## 2019-08-07 NOTE — Telephone Encounter (Signed)
Spoke with pt's wife and advised message. She understood and nothing further is needed.

## 2019-08-14 ENCOUNTER — Telehealth: Payer: Self-pay

## 2019-08-14 ENCOUNTER — Telehealth: Payer: Self-pay | Admitting: *Deleted

## 2019-08-14 ENCOUNTER — Telehealth: Payer: Self-pay | Admitting: Emergency Medicine

## 2019-08-14 DIAGNOSIS — I4819 Other persistent atrial fibrillation: Secondary | ICD-10-CM

## 2019-08-14 DIAGNOSIS — I2510 Atherosclerotic heart disease of native coronary artery without angina pectoris: Secondary | ICD-10-CM

## 2019-08-14 DIAGNOSIS — D509 Iron deficiency anemia, unspecified: Secondary | ICD-10-CM | POA: Diagnosis not present

## 2019-08-14 MED ORDER — TRELEGY ELLIPTA 100-62.5-25 MCG/INH IN AEPB: 1.0000 | INHALATION_SPRAY | Freq: Every day | RESPIRATORY_TRACT | 0 refills | Status: DC

## 2019-08-14 NOTE — Telephone Encounter (Signed)
Pt is scheduled for Coronary Stent Intervention/Coronary Atherectomy 08/22/19 8:30 AM Dr Ellyn Hack.  I spoke with Vicente Males- she is aware that patient should have pre procedure BMP/CBC done 08/16/19 or 08/17/19 at Dr Julien Nordmann office in [place], [place] scheduled for 08/18/19 10:30 AM [place], pt should get in procedure line for test and  will need to quarantine at home after Covid-19 test until going to hospital for procedure 08/22/19.

## 2019-08-14 NOTE — Telephone Encounter (Signed)
All returned to patient, confirmed DOB, verified medication. Aware sample has been placed upfront for pick up.   Nothing further needed at this time.

## 2019-08-14 NOTE — Telephone Encounter (Deleted)
Pt contacted pre-catheterization scheduled at [facility] for: Verified arrival time and place: [place] [facility]) at:   No solid food after midnight prior to cath, clear liquids until 5 AM day of procedure. Contrast allergy: Verified no diabetes medications.  AM meds can be  taken pre-cath with sip of water including: ASA 81 mg   Confirmed patient has responsible adult to drive home post procedure and observe 24 hours after arriving home:   Currently, due to Covid-19 pandemic, only one support person will be allowed with patient. Must be the same support person for that patient's entire stay, will be screened and required to wear a mask. They will be asked to wait in the waiting room for the duration of the patient's stay.  Patients are required to wear a mask when they enter the hospital.

## 2019-08-14 NOTE — Telephone Encounter (Signed)
Coronary Stent Intevention/Coronary Atherectomy 08/22/19  I will forward to Dr Ellyn Hack for review and recommendations: -Confirm patient should be instructed to hold Eliquis 2 days prior to procedure. - Should patient be instructed to take ASA 81 mg in addition to Plavix 75 mg morning of procedure?

## 2019-08-15 DIAGNOSIS — M542 Cervicalgia: Secondary | ICD-10-CM | POA: Diagnosis not present

## 2019-08-15 NOTE — Telephone Encounter (Signed)
Yes, he should take aspirin in addition to Plavix.  [name], MD

## 2019-08-16 DIAGNOSIS — I4819 Other persistent atrial fibrillation: Secondary | ICD-10-CM | POA: Diagnosis not present

## 2019-08-16 DIAGNOSIS — I2510 Atherosclerotic heart disease of native coronary artery without angina pectoris: Secondary | ICD-10-CM | POA: Diagnosis not present

## 2019-08-16 NOTE — Telephone Encounter (Signed)
Pt and wife aware pt should take ASA 81 mg in addition to Plavix morning of procedure. Pt instructed to take last dose of Eliquis on Sunday, no Eliquis starting Monday August 20, 2019 until post procedure.  Pt had pre procedure BMP/CBC today at Dr Julien Nordmann office.

## 2019-08-17 ENCOUNTER — Telehealth: Payer: Self-pay

## 2019-08-17 ENCOUNTER — Ambulatory Visit: Payer: Medicare Other | Admitting: Cardiology

## 2019-08-17 LAB — BASIC METABOLIC PANEL
BUN/Creatinine Ratio: 16 (ref 10–24)
BUN: 20 mg/dL (ref 8–27)
CO2: 27 mmol/L (ref 20–29)
Calcium: 10 mg/dL (ref 8.6–10.2)
Chloride: 98 mmol/L (ref 96–106)
Creatinine, Ser: 1.25 mg/dL (ref 0.76–1.27)
GFR calc Af Amer: 65 mL/min/{1.73_m2} (ref >59)
GFR calc non Af Amer: 56 mL/min/{1.73_m2} — ABNORMAL LOW (ref >59)
Glucose: 134 mg/dL — ABNORMAL HIGH (ref 65–99)
Potassium: 4.4 mmol/L (ref 3.5–5.2)
Sodium: 142 mmol/L (ref 134–144)

## 2019-08-17 LAB — CBC
Hematocrit: 47.8 % (ref 37.5–51.0)
Hemoglobin: 16.1 g/dL (ref 13.0–17.7)
MCH: 30.3 pg (ref 26.6–33.0)
MCHC: 33.7 g/dL (ref 31.5–35.7)
MCV: 90 fL (ref 79–97)
Platelets: 112 10*3/uL — ABNORMAL LOW (ref 150–450)
RBC: 5.31 x10E6/uL (ref 4.14–5.80)
RDW: 14.6 % (ref 11.6–15.4)
WBC: 6.2 10*3/uL (ref 3.4–10.8)

## 2019-08-17 NOTE — Telephone Encounter (Signed)
-----   Message from Jenean Lindau, MD sent at 08/17/2019 10:27 AM EST ----- The results of the study is unremarkable. Please inform patient. I will discuss in detail at next appointment. Cc  primary care/referring physician Jenean Lindau, MD 08/17/2019 10:27 AM

## 2019-08-17 NOTE — Telephone Encounter (Signed)
Results relayed, copy sent to Dr.Lewis and Dr. Helene Kelp

## 2019-08-18 ENCOUNTER — Other Ambulatory Visit (HOSPITAL_COMMUNITY)
Admission: RE | Admit: 2019-08-18 | Discharge: 2019-08-18 | Disposition: A | Discharge status: Home / Self Care | Payer: Medicare Other | Source: Ambulatory Visit | Attending: Cardiology | Admitting: Cardiology

## 2019-08-18 ENCOUNTER — Other Ambulatory Visit (HOSPITAL_COMMUNITY): Payer: Medicare Other

## 2019-08-18 DIAGNOSIS — Z20828 Contact with and (suspected) exposure to other viral communicable diseases: Secondary | ICD-10-CM | POA: Diagnosis not present

## 2019-08-18 DIAGNOSIS — Z01812 Encounter for preprocedural laboratory examination: Secondary | ICD-10-CM | POA: Insufficient documentation

## 2019-08-20 LAB — NOVEL CORONAVIRUS, NAA (HOSP ORDER, SEND-OUT TO REF LAB; TAT 18-24 HRS): SARS-CoV-2, NAA: NOT DETECTED

## 2019-08-20 NOTE — Telephone Encounter (Signed)
Pt contacted pre-catheterization scheduled at [facility] for: Wednesday August 22, 2019 8:30 AM Verified arrival time and place: [facility]) at: 6:30 AM   No solid food after midnight prior to cath, clear liquids until 5 AM day of procedure. Contrast allergy: no  Hold: Eliquis-none 08/20/19 until post procedure. Glipizide-AM of procedure. Lasix-day before and day of procedure-GFR 56   Except hold medications AM meds can be  taken pre-cath with sip of water including: ASA 81 mg Plavix 75 mg  Confirmed patient has responsible adult to drive home post procedure and observe 24 hours after arriving home: yes  Currently, due to Covid-19 pandemic, only one support person will be allowed with patient. Must be the same support person for that patient's entire stay, will be screened and required to wear a mask. They will be asked to wait in the waiting room for the duration of the patient's stay.  Patients are required to wear a mask when they enter the hospital.      COVID-19 Pre-Screening Questions:  . In the past 7 to 10 days have you had a cough,  shortness of breath, headache, congestion, fever (100 or greater) body aches, chills, sore throat, or sudden loss of taste or sense of smell? no . Have you been around anyone with known Covid 19? no . Have you been around anyone who is awaiting Covid 19 test results in the past 7 to 10 days? no . Have you been around anyone who has been exposed to Covid 19, or has mentioned symptoms of Covid 19 within the past 7 to 10 days? no   I reviewed procedure/mask/visitor instructions, Covid-19 screening questions with patient and Vicente Males, they verbalized understanding, thanked me for call.

## 2019-08-22 ENCOUNTER — Encounter (HOSPITAL_COMMUNITY): Payer: Self-pay | Admitting: Cardiology

## 2019-08-22 ENCOUNTER — Other Ambulatory Visit: Payer: Self-pay

## 2019-08-22 ENCOUNTER — Ambulatory Visit (HOSPITAL_COMMUNITY)
Admission: RE | Admit: 2019-08-22 | Discharge: 2019-08-23 | Disposition: A | Discharge status: Home / Self Care | Payer: Medicare Other | Attending: Cardiology | Admitting: Cardiology

## 2019-08-22 ENCOUNTER — Encounter (HOSPITAL_COMMUNITY)
Admission: RE | Disposition: A | Discharge status: Home / Self Care | Payer: Self-pay | Source: Home / Self Care | Attending: Cardiology

## 2019-08-22 DIAGNOSIS — M109 Gout, unspecified: Secondary | ICD-10-CM | POA: Diagnosis not present

## 2019-08-22 DIAGNOSIS — I25119 Atherosclerotic heart disease of native coronary artery with unspecified angina pectoris: Secondary | ICD-10-CM | POA: Diagnosis not present

## 2019-08-22 DIAGNOSIS — E782 Mixed hyperlipidemia: Secondary | ICD-10-CM | POA: Insufficient documentation

## 2019-08-22 DIAGNOSIS — I2584 Coronary atherosclerosis due to calcified coronary lesion: Secondary | ICD-10-CM | POA: Diagnosis not present

## 2019-08-22 DIAGNOSIS — Z7901 Long term (current) use of anticoagulants: Secondary | ICD-10-CM | POA: Diagnosis not present

## 2019-08-22 DIAGNOSIS — K219 Gastro-esophageal reflux disease without esophagitis: Secondary | ICD-10-CM | POA: Insufficient documentation

## 2019-08-22 DIAGNOSIS — Z79899 Other long term (current) drug therapy: Secondary | ICD-10-CM | POA: Diagnosis not present

## 2019-08-22 DIAGNOSIS — Z7902 Long term (current) use of antithrombotics/antiplatelets: Secondary | ICD-10-CM | POA: Diagnosis not present

## 2019-08-22 DIAGNOSIS — E119 Type 2 diabetes mellitus without complications: Secondary | ICD-10-CM | POA: Insufficient documentation

## 2019-08-22 DIAGNOSIS — Z87891 Personal history of nicotine dependence: Secondary | ICD-10-CM | POA: Diagnosis not present

## 2019-08-22 DIAGNOSIS — Z7984 Long term (current) use of oral hypoglycemic drugs: Secondary | ICD-10-CM | POA: Insufficient documentation

## 2019-08-22 DIAGNOSIS — R931 Abnormal findings on diagnostic imaging of heart and coronary circulation: Secondary | ICD-10-CM | POA: Diagnosis present

## 2019-08-22 DIAGNOSIS — J449 Chronic obstructive pulmonary disease, unspecified: Secondary | ICD-10-CM | POA: Insufficient documentation

## 2019-08-22 DIAGNOSIS — I4891 Unspecified atrial fibrillation: Secondary | ICD-10-CM | POA: Diagnosis not present

## 2019-08-22 DIAGNOSIS — R0609 Other forms of dyspnea: Secondary | ICD-10-CM | POA: Diagnosis present

## 2019-08-22 DIAGNOSIS — I10 Essential (primary) hypertension: Secondary | ICD-10-CM | POA: Insufficient documentation

## 2019-08-22 DIAGNOSIS — Z955 Presence of coronary angioplasty implant and graft: Secondary | ICD-10-CM | POA: Diagnosis not present

## 2019-08-22 HISTORY — DX: Abnormal findings on diagnostic imaging of heart and coronary circulation: R93.1

## 2019-08-22 HISTORY — PX: CORONARY STENT INTERVENTION: CATH118234

## 2019-08-22 HISTORY — PX: LEFT HEART CATH AND CORONARY ANGIOGRAPHY: CATH118249

## 2019-08-22 HISTORY — PX: TEMPORARY PACEMAKER: CATH118268

## 2019-08-22 HISTORY — PX: CORONARY BALLOON ANGIOPLASTY: CATH118233

## 2019-08-22 HISTORY — PX: CORONARY ATHERECTOMY: CATH118238

## 2019-08-22 LAB — HEMOGLOBIN A1C
Hgb A1c MFr Bld: 6.9 % — ABNORMAL HIGH (ref 4.8–5.6)
Mean Plasma Glucose: 151.33 mg/dL

## 2019-08-22 LAB — POCT ACTIVATED CLOTTING TIME
Activated Clotting Time: 263 seconds
Activated Clotting Time: 285 seconds
Activated Clotting Time: 340 seconds
Activated Clotting Time: 219 seconds
Activated Clotting Time: 345 seconds
Activated Clotting Time: 202 seconds
Activated Clotting Time: 164 seconds

## 2019-08-22 LAB — CBC
HCT: 47.2 % (ref 39.0–52.0)
Hemoglobin: 15.9 g/dL (ref 13.0–17.0)
MCH: 30.6 pg (ref 26.0–34.0)
MCHC: 33.7 g/dL (ref 30.0–36.0)
MCV: 90.8 fL (ref 80.0–100.0)
Platelets: 120 10*3/uL — ABNORMAL LOW (ref 150–400)
RBC: 5.2 MIL/uL (ref 4.22–5.81)
RDW: 15.3 % (ref 11.5–15.5)
WBC: 7 10*3/uL (ref 4.0–10.5)
nRBC: 0 % (ref 0.0–0.2)

## 2019-08-22 LAB — GLUCOSE, CAPILLARY
Glucose-Capillary: 139 mg/dL — ABNORMAL HIGH (ref 70–99)
Glucose-Capillary: 117 mg/dL — ABNORMAL HIGH (ref 70–99)
Glucose-Capillary: 126 mg/dL — ABNORMAL HIGH (ref 70–99)
Glucose-Capillary: 225 mg/dL — ABNORMAL HIGH (ref 70–99)

## 2019-08-22 SURGERY — CORONARY STENT INTERVENTION
Anesthesia: LOCAL

## 2019-08-22 MED ORDER — SODIUM CHLORIDE 0.9% FLUSH
3.0000 mL | Freq: Two times a day (BID) | INTRAVENOUS | Status: DC
  Administered 2019-08-22: 3 mL via INTRAVENOUS

## 2019-08-22 MED ORDER — VITAMIN D 25 MCG (1000 UNIT) PO TABS
2000.0000 [IU] | ORAL_TABLET | Freq: Every day | ORAL | Status: DC
  Administered 2019-08-23: 2000 [IU] via ORAL
  Filled 2019-08-22: qty 2

## 2019-08-22 MED ORDER — CLOPIDOGREL BISULFATE 75 MG PO TABS: 75.0000 mg | ORAL_TABLET | ORAL | Status: DC

## 2019-08-22 MED ORDER — MIDAZOLAM HCL 2 MG/2ML IJ SOLN
INTRAMUSCULAR | Status: DC | PRN
  Administered 2019-08-22 (×4): 1 mg via INTRAVENOUS

## 2019-08-22 MED ORDER — IOHEXOL 350 MG/ML SOLN
INTRAVENOUS | Status: DC | PRN
  Administered 2019-08-22: 170 mL via INTRA_ARTERIAL

## 2019-08-22 MED ORDER — HEPARIN (PORCINE) IN NACL 1000-0.9 UT/500ML-% IV SOLN
INTRAVENOUS | Status: DC | PRN
  Administered 2019-08-22 (×3): 500 mL

## 2019-08-22 MED ORDER — HEPARIN SODIUM (PORCINE) 1000 UNIT/ML IJ SOLN
INTRAMUSCULAR | Status: DC | PRN
  Administered 2019-08-22: 4000 [IU] via INTRAVENOUS
  Administered 2019-08-22: 3000 [IU] via INTRAVENOUS
  Administered 2019-08-22: 7500 [IU] via INTRAVENOUS

## 2019-08-22 MED ORDER — SODIUM CHLORIDE 0.9% FLUSH: 3.0000 mL | INTRAVENOUS | Status: DC | PRN

## 2019-08-22 MED ORDER — LABETALOL HCL 5 MG/ML IV SOLN: 10.0000 mg | INTRAVENOUS | Status: AC | PRN

## 2019-08-22 MED ORDER — SODIUM CHLORIDE 0.9 % IV SOLN
INTRAVENOUS | Status: AC | PRN
  Administered 2019-08-22: 10 mL/h via INTRAVENOUS

## 2019-08-22 MED ORDER — IPRATROPIUM BROMIDE 0.06 % NA SOLN
2.0000 | Freq: Two times a day (BID) | NASAL | Status: DC | PRN
  Filled 2019-08-22: qty 30
  Filled 2019-08-22: qty 15

## 2019-08-22 MED ORDER — SODIUM CHLORIDE 0.9 % WEIGHT BASED INFUSION
3.0000 mL/kg/h | INTRAVENOUS | Status: DC
  Administered 2019-08-22: 3 mL/kg/h via INTRAVENOUS

## 2019-08-22 MED ORDER — FLUTICASONE-UMECLIDIN-VILANT 100-62.5-25 MCG/INH IN AEPB: 1.0000 | INHALATION_SPRAY | Freq: Every evening | RESPIRATORY_TRACT | Status: DC

## 2019-08-22 MED ORDER — SODIUM CHLORIDE 0.9 % WEIGHT BASED INFUSION: 1.0000 mL/kg/h | INTRAVENOUS | Status: DC

## 2019-08-22 MED ORDER — ACETAMINOPHEN 325 MG PO TABS
650.0000 mg | ORAL_TABLET | ORAL | Status: DC | PRN
  Administered 2019-08-22: 650 mg via ORAL

## 2019-08-22 MED ORDER — HYDRALAZINE HCL 20 MG/ML IJ SOLN
10.0000 mg | INTRAMUSCULAR | Status: AC | PRN
  Administered 2019-08-22: 10 mg via INTRAVENOUS

## 2019-08-22 MED ORDER — SODIUM CHLORIDE 0.9 % IV SOLN: 250.0000 mL | INTRAVENOUS | Status: DC | PRN

## 2019-08-22 MED ORDER — ALLOPURINOL 300 MG PO TABS
300.0000 mg | ORAL_TABLET | Freq: Every day | ORAL | Status: DC
  Administered 2019-08-23: 300 mg via ORAL
  Filled 2019-08-22: qty 1

## 2019-08-22 MED ORDER — LIDOCAINE HCL (PF) 1 % IJ SOLN
INTRAMUSCULAR | Status: AC
  Filled 2019-08-22: qty 30

## 2019-08-22 MED ORDER — ONDANSETRON HCL 4 MG/2ML IJ SOLN: 4.0000 mg | Freq: Four times a day (QID) | INTRAMUSCULAR | Status: DC | PRN

## 2019-08-22 MED ORDER — ASPIRIN 81 MG PO CHEW: 81.0000 mg | CHEWABLE_TABLET | ORAL | Status: DC

## 2019-08-22 MED ORDER — UMECLIDINIUM BROMIDE 62.5 MCG/INH IN AEPB
1.0000 | INHALATION_SPRAY | Freq: Every day | RESPIRATORY_TRACT | Status: DC
  Administered 2019-08-22: 1 via RESPIRATORY_TRACT
  Filled 2019-08-22: qty 7

## 2019-08-22 MED ORDER — NITROGLYCERIN 1 MG/10 ML FOR IR/CATH LAB
INTRA_ARTERIAL | Status: DC | PRN
  Administered 2019-08-22 (×2): 200 ug via INTRACORONARY

## 2019-08-22 MED ORDER — ATORVASTATIN CALCIUM 40 MG PO TABS
40.0000 mg | ORAL_TABLET | Freq: Every evening | ORAL | Status: DC
  Administered 2019-08-22: 40 mg via ORAL
  Filled 2019-08-22: qty 1

## 2019-08-22 MED ORDER — ALBUTEROL SULFATE HFA 108 (90 BASE) MCG/ACT IN AERS: 2.0000 | INHALATION_SPRAY | Freq: Four times a day (QID) | RESPIRATORY_TRACT | Status: DC | PRN

## 2019-08-22 MED ORDER — VITAMIN C 500 MG PO TABS
1000.0000 mg | ORAL_TABLET | Freq: Every day | ORAL | Status: DC
  Administered 2019-08-23: 1000 mg via ORAL
  Filled 2019-08-22: qty 2

## 2019-08-22 MED ORDER — OMEGA-3-ACID ETHYL ESTERS 1 G PO CAPS
2.0000 g | ORAL_CAPSULE | Freq: Two times a day (BID) | ORAL | Status: DC
  Administered 2019-08-22 – 2019-08-23 (×2): 2 g via ORAL
  Filled 2019-08-22 (×2): qty 2

## 2019-08-22 MED ORDER — HEPARIN (PORCINE) IN NACL 1000-0.9 UT/500ML-% IV SOLN
INTRAVENOUS | Status: AC
  Filled 2019-08-22: qty 1000

## 2019-08-22 MED ORDER — FLUTICASONE FUROATE-VILANTEROL 100-25 MCG/INH IN AEPB
1.0000 | INHALATION_SPRAY | Freq: Every day | RESPIRATORY_TRACT | Status: DC
  Administered 2019-08-22: 1 via RESPIRATORY_TRACT
  Filled 2019-08-22: qty 28

## 2019-08-22 MED ORDER — TRAZODONE HCL 150 MG PO TABS
150.0000 mg | ORAL_TABLET | Freq: Every day | ORAL | Status: DC
  Administered 2019-08-22: 150 mg via ORAL
  Filled 2019-08-22 (×2): qty 1

## 2019-08-22 MED ORDER — SODIUM CHLORIDE 0.9 % IV SOLN: INTRAVENOUS | Status: AC

## 2019-08-22 MED ORDER — TAMSULOSIN HCL 0.4 MG PO CAPS
0.4000 mg | ORAL_CAPSULE | Freq: Every day | ORAL | Status: DC
  Filled 2019-08-22: qty 1

## 2019-08-22 MED ORDER — METOPROLOL SUCCINATE 12.5 MG HALF TABLET
12.5000 mg | ORAL_TABLET | Freq: Every day | ORAL | Status: DC
  Administered 2019-08-23: 12.5 mg via ORAL
  Filled 2019-08-22 (×2): qty 1

## 2019-08-22 MED ORDER — ALBUTEROL SULFATE (2.5 MG/3ML) 0.083% IN NEBU
2.5000 mg | INHALATION_SOLUTION | Freq: Four times a day (QID) | RESPIRATORY_TRACT | Status: DC | PRN
  Administered 2019-08-22: 2.5 mg via RESPIRATORY_TRACT

## 2019-08-22 MED ORDER — GLIPIZIDE ER 2.5 MG PO TB24
2.5000 mg | ORAL_TABLET | Freq: Every day | ORAL | Status: DC
  Administered 2019-08-23: 2.5 mg via ORAL
  Filled 2019-08-22: qty 1

## 2019-08-22 MED ORDER — POTASSIUM GLUCONATE 595 (99 K) MG PO TABS: 595.0000 mg | ORAL_TABLET | ORAL | Status: DC

## 2019-08-22 MED ORDER — HYDRALAZINE HCL 20 MG/ML IJ SOLN
INTRAMUSCULAR | Status: AC
  Filled 2019-08-22: qty 1

## 2019-08-22 MED ORDER — LIDOCAINE HCL (PF) 1 % IJ SOLN
INTRAMUSCULAR | Status: DC | PRN
  Administered 2019-08-22: 16 mL via INTRADERMAL

## 2019-08-22 MED ORDER — MIDAZOLAM HCL 2 MG/2ML IJ SOLN
INTRAMUSCULAR | Status: AC
  Filled 2019-08-22: qty 2

## 2019-08-22 MED ORDER — KETOTIFEN FUMARATE 0.025 % OP SOLN: 1.0000 [drp] | Freq: Two times a day (BID) | OPHTHALMIC | Status: DC | PRN

## 2019-08-22 MED ORDER — FENTANYL CITRATE (PF) 100 MCG/2ML IJ SOLN
INTRAMUSCULAR | Status: DC | PRN
  Administered 2019-08-22 (×4): 25 ug via INTRAVENOUS

## 2019-08-22 MED ORDER — ACETAMINOPHEN 325 MG PO TABS
ORAL_TABLET | ORAL | Status: AC
  Filled 2019-08-22: qty 2

## 2019-08-22 MED ORDER — ALBUTEROL SULFATE (2.5 MG/3ML) 0.083% IN NEBU
INHALATION_SOLUTION | RESPIRATORY_TRACT | Status: AC
  Filled 2019-08-22: qty 3

## 2019-08-22 MED ORDER — APIXABAN 5 MG PO TABS
5.0000 mg | ORAL_TABLET | Freq: Two times a day (BID) | ORAL | Status: DC
  Administered 2019-08-22 – 2019-08-23 (×2): 5 mg via ORAL
  Filled 2019-08-22 (×2): qty 1

## 2019-08-22 MED ORDER — INSULIN ASPART 100 UNIT/ML ~~LOC~~ SOLN
0.0000 [IU] | Freq: Three times a day (TID) | SUBCUTANEOUS | Status: DC
  Administered 2019-08-23: 3 [IU] via SUBCUTANEOUS

## 2019-08-22 MED ORDER — FENTANYL CITRATE (PF) 100 MCG/2ML IJ SOLN
INTRAMUSCULAR | Status: AC
  Filled 2019-08-22: qty 2

## 2019-08-22 MED ORDER — NITROGLYCERIN 0.4 MG SL SUBL: 0.4000 mg | SUBLINGUAL_TABLET | SUBLINGUAL | Status: DC | PRN

## 2019-08-22 MED ORDER — HEPARIN SODIUM (PORCINE) 1000 UNIT/ML IJ SOLN
INTRAMUSCULAR | Status: AC
  Filled 2019-08-22: qty 1

## 2019-08-22 MED ORDER — NITROGLYCERIN 1 MG/10 ML FOR IR/CATH LAB
INTRA_ARTERIAL | Status: AC
  Filled 2019-08-22: qty 10

## 2019-08-22 MED ORDER — CLOPIDOGREL BISULFATE 75 MG PO TABS
75.0000 mg | ORAL_TABLET | Freq: Every day | ORAL | Status: DC
  Administered 2019-08-23: 75 mg via ORAL
  Filled 2019-08-22: qty 1

## 2019-08-22 MED ORDER — POLYETHYL GLYCOL-PROPYL GLYCOL 0.4-0.3 % OP SOLN: 1.0000 [drp] | Freq: Two times a day (BID) | OPHTHALMIC | Status: DC | PRN

## 2019-08-22 SURGICAL SUPPLY — 32 items
BALLN  ~~LOC~~ SAPPHIRE 4.5X12 (BALLOONS) ×1
BALLN SAPPHIRE 2.5X12 (BALLOONS) ×2
BALLN SAPPHIRE 3.0X15 (BALLOONS) ×2
BALLN SAPPHIRE ~~LOC~~ 2.5X8 (BALLOONS) ×2 IMPLANT
BALLN WOLVERINE 2.50X10 (BALLOONS) ×2
BALLN ~~LOC~~ SAPPHIRE 4.5X12 (BALLOONS) ×1
BALLOON SAPPHIRE 2.5X12 (BALLOONS) ×1 IMPLANT
BALLOON SAPPHIRE 3.0X15 (BALLOONS) ×1 IMPLANT
BALLOON WOLVERINE 2.50X10 (BALLOONS) ×1 IMPLANT
BALLOON ~~LOC~~ SAPPHIRE 4.5X12 (BALLOONS) ×1 IMPLANT
CATH S G BIP PACING (CATHETERS) ×2 IMPLANT
CATH TELEPORT (CATHETERS) ×2 IMPLANT
CATH VISTA GUIDE 7FR JR4 (CATHETERS) ×2 IMPLANT
CATH VISTA GUIDE 7FR XB 3.5 (CATHETERS) ×2 IMPLANT
CROWN DIAMONDBACK CLASSIC 1.25 (BURR) ×2 IMPLANT
ELECT DEFIB PAD ADLT CADENCE (PAD) ×2 IMPLANT
KIT ENCORE 26 ADVANTAGE (KITS) ×2 IMPLANT
KIT HEART LEFT (KITS) ×2 IMPLANT
PACK CARDIAC CATHETERIZATION (CUSTOM PROCEDURE TRAY) ×2 IMPLANT
SHEATH PINNACLE 6F 10CM (SHEATH) ×2 IMPLANT
SHEATH PINNACLE 7F 10CM (SHEATH) ×2 IMPLANT
SHEATH PROBE COVER 6X72 (BAG) ×2 IMPLANT
SLEEVE REPOSITIONING LENGTH 30 (MISCELLANEOUS) ×2 IMPLANT
STENT RESOLUTE ONYX 4.0X38 (Permanent Stent) ×2 IMPLANT
TRANSDUCER W/STOPCOCK (MISCELLANEOUS) ×2 IMPLANT
TUBING CIL FLEX 10 FLL-RA (TUBING) ×2 IMPLANT
WIRE ASAHI FIELDER XT 190CM (WIRE) ×2 IMPLANT
WIRE ASAHI PROWATER 180CM (WIRE) ×2 IMPLANT
WIRE ASAHI PROWATER 300CM (WIRE) ×2 IMPLANT
WIRE EMERALD 3MM-J .035X150CM (WIRE) ×2 IMPLANT
WIRE RUNTHROUGH .014X300CM (WIRE) ×2 IMPLANT
WIRE VIPERWIRE COR FLEX .012 (WIRE) ×4 IMPLANT

## 2019-08-22 NOTE — Progress Notes (Signed)
Patient transferred from cath lab at 1524hrs.  Right femoral site level 0.  Patient oriented to unit and post cath instructions, advised BR until 1900hrs. Patient verbalized understanding.

## 2019-08-22 NOTE — Interval H&P Note (Signed)
History and Physical Interval Note:  08/22/2019 8:37 AM  Juan Valdez  has presented today for surgery, with the diagnosis of CAD with Class III Angina/DOE. .  The various methods of treatment have been discussed with the patient and family. After consideration of risks, benefits and other options for treatment, the patient has consented to  Procedure(s): CORONARY STENT INTERVENTION (N/A) CORONARY ATHERECTOMY (N/A)   as a surgical intervention.  The patient's history has been reviewed, patient examined, no change in status, stable for surgery.  I have reviewed the patient's chart and labs.  Questions were answered to the patient's satisfaction.    Cath Lab Visit (complete for each Cath Lab visit)  Clinical Evaluation Leading to the Procedure:   ACS: No.  Non-ACS:    Anginal Classification: CCS III  Anti-ischemic medical therapy: Minimal Therapy (1 class of medications)  Non-Invasive Test Results: No non-invasive testing performed  Prior CABG: No previous CABG  Juan Valdez

## 2019-08-22 NOTE — Progress Notes (Signed)
    Cath Lab Visit (complete for each Cath Lab visit)  Clinical Evaluation Leading to the Procedure:   ACS: No.  Non-ACS:    Anginal Classification: CCS III  Anti-ischemic medical therapy: Minimal Therapy (1 class of medications)  Non-Invasive Test Results: High-risk stress test findings: cardiac mortality >3%/year  Prior CABG: No previous CABG   [name], MD

## 2019-08-22 NOTE — Progress Notes (Signed)
Site area: Right groin a 7 french arterial and 6 french venous sheath was removed  Site Prior to Removal:  Level 0  Pressure Applied For 35 MINUTES    Bedrest Beginning at 1500p  Manual:   Yes.    Patient Status During Pull:  stable  Post Pull Groin Site:  Level 0  Post Pull Instructions Given:  Yes.    Post Pull Pulses Present:  Yes.    Dressing Applied:  Yes.    Comments:

## 2019-08-23 DIAGNOSIS — E119 Type 2 diabetes mellitus without complications: Secondary | ICD-10-CM | POA: Diagnosis not present

## 2019-08-23 DIAGNOSIS — I10 Essential (primary) hypertension: Secondary | ICD-10-CM | POA: Diagnosis not present

## 2019-08-23 DIAGNOSIS — M109 Gout, unspecified: Secondary | ICD-10-CM | POA: Diagnosis not present

## 2019-08-23 DIAGNOSIS — Z7902 Long term (current) use of antithrombotics/antiplatelets: Secondary | ICD-10-CM | POA: Diagnosis not present

## 2019-08-23 DIAGNOSIS — Z7984 Long term (current) use of oral hypoglycemic drugs: Secondary | ICD-10-CM | POA: Diagnosis not present

## 2019-08-23 DIAGNOSIS — K219 Gastro-esophageal reflux disease without esophagitis: Secondary | ICD-10-CM | POA: Diagnosis not present

## 2019-08-23 DIAGNOSIS — Z87891 Personal history of nicotine dependence: Secondary | ICD-10-CM | POA: Diagnosis not present

## 2019-08-23 DIAGNOSIS — Z955 Presence of coronary angioplasty implant and graft: Secondary | ICD-10-CM | POA: Diagnosis not present

## 2019-08-23 DIAGNOSIS — Z7901 Long term (current) use of anticoagulants: Secondary | ICD-10-CM | POA: Diagnosis not present

## 2019-08-23 DIAGNOSIS — J449 Chronic obstructive pulmonary disease, unspecified: Secondary | ICD-10-CM | POA: Diagnosis not present

## 2019-08-23 DIAGNOSIS — I4891 Unspecified atrial fibrillation: Secondary | ICD-10-CM | POA: Diagnosis not present

## 2019-08-23 DIAGNOSIS — I25119 Atherosclerotic heart disease of native coronary artery with unspecified angina pectoris: Secondary | ICD-10-CM | POA: Diagnosis not present

## 2019-08-23 DIAGNOSIS — Z79899 Other long term (current) drug therapy: Secondary | ICD-10-CM | POA: Diagnosis not present

## 2019-08-23 DIAGNOSIS — I2584 Coronary atherosclerosis due to calcified coronary lesion: Secondary | ICD-10-CM | POA: Diagnosis not present

## 2019-08-23 DIAGNOSIS — E782 Mixed hyperlipidemia: Secondary | ICD-10-CM | POA: Diagnosis not present

## 2019-08-23 LAB — BASIC METABOLIC PANEL
Anion gap: 10 (ref 5–15)
BUN: 15 mg/dL (ref 8–23)
CO2: 29 mmol/L (ref 22–32)
Calcium: 9.2 mg/dL (ref 8.9–10.3)
Chloride: 102 mmol/L (ref 98–111)
Creatinine, Ser: 1.29 mg/dL — ABNORMAL HIGH (ref 0.61–1.24)
GFR calc Af Amer: >60 mL/min (ref >60)
GFR calc non Af Amer: 54 mL/min — ABNORMAL LOW (ref >60)
Glucose, Bld: 141 mg/dL — ABNORMAL HIGH (ref 70–99)
Potassium: 4.2 mmol/L (ref 3.5–5.1)
Sodium: 141 mmol/L (ref 135–145)

## 2019-08-23 LAB — GLUCOSE, CAPILLARY: Glucose-Capillary: 165 mg/dL — ABNORMAL HIGH (ref 70–99)

## 2019-08-23 LAB — CBC
HCT: 45.8 % (ref 39.0–52.0)
Hemoglobin: 14.9 g/dL (ref 13.0–17.0)
MCH: 30.3 pg (ref 26.0–34.0)
MCHC: 32.5 g/dL (ref 30.0–36.0)
MCV: 93.1 fL (ref 80.0–100.0)
Platelets: 109 10*3/uL — ABNORMAL LOW (ref 150–400)
RBC: 4.92 MIL/uL (ref 4.22–5.81)
RDW: 15.5 % (ref 11.5–15.5)
WBC: 7.1 10*3/uL (ref 4.0–10.5)
nRBC: 0 % (ref 0.0–0.2)

## 2019-08-23 NOTE — Discharge Instructions (Signed)
Femoral Site Care This sheet gives you information about how to care for yourself after your procedure. Your health care provider may also give you more specific instructions. If you have problems or questions, contact your health care provider. What can I expect after the procedure? After the procedure, it is common to have:  Bruising that usually fades within 1-2 weeks.  Tenderness at the site. Follow these instructions at home: Wound care  Follow instructions from your health care provider about how to take care of your insertion site. Make sure you: ? Wash your hands with soap and water before you change your bandage (dressing). If soap and water are not available, use hand sanitizer. ? Change your dressing as told by your health care provider. ? Leave stitches (sutures), skin glue, or adhesive strips in place. These skin closures may need to stay in place for 2 weeks or longer. If adhesive strip edges start to loosen and curl up, you may trim the loose edges. Do not remove adhesive strips completely unless your health care provider tells you to do that.  Do not take baths, swim, or use a hot tub until your health care provider approves.  You may shower 24-48 hours after the procedure or as told by your health care provider. ? Gently wash the site with plain soap and water. ? Pat the area dry with a clean towel. ? Do not rub the site. This may cause bleeding.  Do not apply powder or lotion to the site. Keep the site clean and dry.  Check your femoral site every day for signs of infection. Check for: ? Redness, swelling, or pain. ? Fluid or blood. ? Warmth. ? Pus or a bad smell. Activity  For the first 2-3 days after your procedure, or as long as directed: ? Avoid climbing stairs as much as possible. ? Do not squat.  Do not lift anything that is heavier than 10 lb (4.5 kg), or the limit that you are told, until your health care provider says that it is safe.  Rest as  directed. ? Avoid sitting for a long time without moving. Get up to take short walks every 1-2 hours.  Do not drive for 24 hours if you were given a medicine to help you relax (sedative). General instructions  Take over-the-counter and prescription medicines only as told by your health care provider.  Keep all follow-up visits as told by your health care provider. This is important. Contact a health care provider if you have:  A fever or chills.  You have redness, swelling, or pain around your insertion site. Get help right away if:  The catheter insertion area swells very fast.  You pass out.  You suddenly start to sweat or your skin gets clammy.  The catheter insertion area is bleeding, and the bleeding does not stop when you hold steady pressure on the area.  The area near or just beyond the catheter insertion site becomes pale, cool, tingly, or numb. These symptoms may represent a serious problem that is an emergency. Do not wait to see if the symptoms will go away. Get medical help right away. Call your local emergency services (911 in the [place] Do not drive yourself to the hospital. Summary  After the procedure, it is common to have bruising that usually fades within 1-2 weeks.  Check your femoral site every day for signs of infection.  Do not lift anything that is heavier than 10 lb (4.5 kg), or the  limit that you are told, until your health care provider says that it is safe. This information is not intended to replace advice given to you by your health care provider. Make sure you discuss any questions you have with your health care provider. Document Released: 05/03/2014 Document Revised: 09/12/2017 Document Reviewed: 09/12/2017 Elsevier Patient Education  2020 [employer].    Diabetes Mellitus and Nutrition, Adult When you have diabetes (diabetes mellitus), it is very important to have healthy eating habits because your blood sugar (glucose) levels are greatly  affected by what you eat and drink. Eating healthy foods in the appropriate amounts, at about the same times every day, can help you:  Control your blood glucose.  Lower your risk of heart disease.  Improve your blood pressure.  Reach or maintain a healthy weight. Every person with diabetes is different, and each person has different needs for a meal plan. Your health care provider may recommend that you work with a diet and nutrition specialist (dietitian) to make a meal plan that is best for you. Your meal plan may vary depending on factors such as:  The calories you need.  The medicines you take.  Your weight.  Your blood glucose, blood pressure, and cholesterol levels.  Your activity level.  Other health conditions you have, such as heart or kidney disease. How do carbohydrates affect me? Carbohydrates, also called carbs, affect your blood glucose level more than any other type of food. Eating carbs naturally raises the amount of glucose in your blood. Carb counting is a method for keeping track of how many carbs you eat. Counting carbs is important to keep your blood glucose at a healthy level, especially if you use insulin or take certain oral diabetes medicines. It is important to know how many carbs you can safely have in each meal. This is different for every person. Your dietitian can help you calculate how many carbs you should have at each meal and for each snack. Foods that contain carbs include:  Bread, cereal, rice, pasta, and crackers.  Potatoes and corn.  Peas, beans, and lentils.  Milk and yogurt.  Fruit and juice.  Desserts, such as cakes, cookies, ice cream, and candy. How does alcohol affect me? Alcohol can cause a sudden decrease in blood glucose (hypoglycemia), especially if you use insulin or take certain oral diabetes medicines. Hypoglycemia can be a life-threatening condition. Symptoms of hypoglycemia (sleepiness, dizziness, and confusion) are similar  to symptoms of having too much alcohol. If your health care provider says that alcohol is safe for you, follow these guidelines:  Limit alcohol intake to no more than 1 drink per day for nonpregnant women and 2 drinks per day for men. One drink equals 12 oz of beer, 5 oz of wine, or 1 oz of hard liquor.  Do not drink on an empty stomach.  Keep yourself hydrated with water, diet soda, or unsweetened iced tea.  Keep in mind that regular soda, juice, and other mixers may contain a lot of sugar and must be counted as carbs. What are tips for following this plan?  Reading food labels  Start by checking the serving size on the "Nutrition Facts" label of packaged foods and drinks. The amount of calories, carbs, fats, and other nutrients listed on the label is based on one serving of the item. Many items contain more than one serving per package.  Check the total grams (g) of carbs in one serving. You can calculate the number of  servings of carbs in one serving by dividing the total carbs by 15. For example, if a food has 30 g of total carbs, it would be equal to 2 servings of carbs.  Check the number of grams (g) of saturated and trans fats in one serving. Choose foods that have low or no amount of these fats.  Check the number of milligrams (mg) of salt (sodium) in one serving. Most people should limit total sodium intake to less than 2,300 mg per day.  Always check the nutrition information of foods labeled as "low-fat" or "nonfat". These foods may be higher in added sugar or refined carbs and should be avoided.  Talk to your dietitian to identify your daily goals for nutrients listed on the label. Shopping  Avoid buying canned, premade, or processed foods. These foods tend to be high in fat, sodium, and added sugar.  Shop around the outside edge of the grocery store. This includes fresh fruits and vegetables, bulk grains, fresh meats, and fresh dairy. Cooking  Use low-heat cooking  methods, such as baking, instead of high-heat cooking methods like deep frying.  Cook using healthy oils, such as olive, canola, or sunflower oil.  Avoid cooking with butter, cream, or high-fat meats. Meal planning  Eat meals and snacks regularly, preferably at the same times every day. Avoid going long periods of time without eating.  Eat foods high in fiber, such as fresh fruits, vegetables, beans, and whole grains. Talk to your dietitian about how many servings of carbs you can eat at each meal.  Eat 4-6 ounces (oz) of lean protein each day, such as lean meat, chicken, fish, eggs, or tofu. One oz of lean protein is equal to: ? 1 oz of meat, chicken, or fish. ? 1 egg. ?  cup of tofu.  Eat some foods each day that contain healthy fats, such as avocado, nuts, seeds, and fish. Lifestyle  Check your blood glucose regularly.  Exercise regularly as told by your health care provider. This may include: ? 150 minutes of moderate-intensity or vigorous-intensity exercise each week. This could be brisk walking, biking, or water aerobics. ? Stretching and doing strength exercises, such as yoga or weightlifting, at least 2 times a week.  Take medicines as told by your health care provider.  Do not use any products that contain nicotine or tobacco, such as cigarettes and e-cigarettes. If you need help quitting, ask your health care provider.  Work with a Social worker or diabetes educator to identify strategies to manage stress and any emotional and social challenges. Questions to ask a health care provider  Do I need to meet with a diabetes educator?  Do I need to meet with a dietitian?  What number can I call if I have questions?  When are the best times to check my blood glucose? Where to find more information:  American Diabetes Association: diabetes.org  Academy of Nutrition and Dietetics: www.eatright.[employer] of Diabetes and Digestive and Kidney Diseases (NIH):  [employer] Summary  A healthy meal plan will help you control your blood glucose and maintain a healthy lifestyle.  Working with a diet and nutrition specialist (dietitian) can help you make a meal plan that is best for you.  Keep in mind that carbohydrates (carbs) and alcohol have immediate effects on your blood glucose levels. It is important to count carbs and to use alcohol carefully. This information is not intended to replace advice given to you by your health care provider. Make  sure you discuss any questions you have with your health care provider. Document Released: 05/27/2005 Document Revised: 08/12/2017 Document Reviewed: 10/04/2016 Elsevier Patient Education  2020 [employer].

## 2019-08-23 NOTE — Discharge Summary (Addendum)
Discharge Summary    Patient ID: Juan Valdez,  MRN: [id number], DOB/AGE: 09-21-1945 73 y.o.  Admit date: 08/22/2019 Discharge date: 08/23/2019  Primary Care Provider: No primary care provider on file. Primary Cardiologist: Jenean Lindau, MD  Discharge Diagnoses    Principal Problem:   Coronary artery disease involving native coronary artery of native heart with angina pectoris [facility]) Active Problems:   DOE (dyspnea on exertion)   Abnormal nuclear cardiac imaging test  Allergies No Known Allergies  Diagnostic Studies/Procedures    Cath: 08/22/19   LESION #1: Prox RCA to Mid RCA lesion is 65-70% stenosed.  Orbital atherectomy was performed on the entire segment.  A drug-eluting stent was successfully placed covering the entire lesion, using a STENT RESOLUTE ONYX 4.0X38 -> this was postdilated with a 4.5 mm balloon to roughly 4.6 mm.  Post intervention, there is a 0% residual stenosis.  Previously placed Dist RCA stent is 5% stenosed.  ----------------------------------------  Prox Cx lesion is 85% stenosed. Prox Cx to Mid Cx lesion is 70% stenosed (this lesion actually became identifiable with angioplasty of the ostial lesion)  Scoring balloon angioplasty was performed using a BALLOON WOLVERINE 2.50X10 -followed by a 2.5 mm x 60mm [place] balloon  Stent was not delivered due to inability to fully expand the balloon in the 70% lesion.  Post intervention, there is a 45% residual stenosis in the original identified 85% lesion.  Post intervention, there is a 50% residual stenosis in the newly identified 70% more calcified lesion..  -Downstream: Dist Cx lesion is 100% stenosed. 3rd Mrg lesion is 100% stenosed.  ----------------------------------------  LV end diastolic pressure is normal. There is no aortic valve stenosis.   SUMMARY  Successful orbital atherectomy based PCI of the proximal to mid RCA-> Resolute Onyx DES 4.0 mm x 38 mm postdilated to 4.5  mm)  Scoring balloon angioplasty only of proximal LCx (unable to advance wire adequately to perform atherectomy) -> reduced from 85% to 50%.  Normal LVEDP  Successful temporary pacemaker placement   RECOMMENDATIONS  Overnight monitoring for post PCI care and sheath removal.  We will restart Eliquis tonight  He is on statin and beta-blocker.  Will adjust medicines accordingly.  Provided he has improvement of symptoms, would just continue with PTCA only of the Circumflex as there was a suboptimal but fairly good result reducing 85% down to 50% stenosis.  [name], MD  Diagnostic Dominance: Right  Intervention    _____________   History of Present Illness     Juan Valdez is a 73 y.o. male with known coronary artery disease, HTN, HL, GERD, COPD, Lung CA who presented to the office for follow up with Dr. Geraldo Pitter. He recently underwent coronary stenting in a staged fashion and was due to go back for his subsequent intervention.  He denied any problems at his office visit.  When he walks after a certain distance he would become short of breath.  No orthopnea or PND.  He is on clopidogrel and Eliquis for atrial fibrillation.  He denied any chest pain orthopnea or PND.  He was set up for outpatient cardiac cath.   Hospital Course     Underwent successful orbital atherectomy and PCI/DES x1 to the mRCA, along with balloon angioplasty of the pLCx reducing lesion from 85-50%. Normal LVEDP. Plan for plavix alone with Eliquis for at least one year. Did have hematoma and bruising to the right groin but stable the following morning. Labs with stable  H/H. He will be continued on his home medications without change. Have asked that he monitor his blood pressures at home. Consider adding spiro if elevated. Also may be a good candidate for Jardiance as an outpatient. Worked well with cardiac rehab without recurrent chest pain.   General: Well developed, well nourished, male appearing  in no acute distress. Head: Normocephalic, atraumatic.  Neck: Supple without bruits, JVD. Lungs:  Resp regular and unlabored, CTA. Heart: RRR, S1, S2, no S3, S4, or murmur; no rub. Abdomen: Soft, non-tender, non-distended with normoactive bowel sounds. No hepatomegaly. No rebound/guarding. No obvious abdominal masses. Extremities: No clubbing, cyanosis, edema. Distal pedal pulses are 2+ bilaterally. Right femoral cath site stable but with bruising, soft no hematoma Neuro: Alert and oriented X 3. Moves all extremities spontaneously. Psych: Normal affect.  Juan Valdez was seen by Dr. Angelena Form and determined stable for discharge home. Follow up in the office has been arranged. Medications are listed below.   _____________  Discharge Vitals Blood pressure (!) 153/77, pulse 68, temperature 97.7 F (36.5 C), temperature source Oral, resp. rate 18, height 5\' 9"  (1.753 m), weight 88.8 kg, SpO2 96 %.  Filed Weights   08/22/19 0715 08/23/19 0500  Weight: 86.2 kg 88.8 kg    Labs & Radiologic Studies    CBC Recent Labs    08/22/19 0743 08/23/19 0424  WBC 7.0 7.1  HGB 15.9 14.9  HCT 47.2 45.8  MCV 90.8 93.1  PLT 120* 578*   Basic Metabolic Panel Recent Labs    08/23/19 0424  NA 141  K 4.2  CL 102  CO2 29  GLUCOSE 141*  BUN 15  CREATININE 1.29*  CALCIUM 9.2   Liver Function Tests No results for input(s): AST, ALT, ALKPHOS, BILITOT, PROT, ALBUMIN in the last 72 hours. No results for input(s): LIPASE, AMYLASE in the last 72 hours. Cardiac Enzymes No results for input(s): CKTOTAL, CKMB, CKMBINDEX, TROPONINI in the last 72 hours. BNP Invalid input(s): POCBNP D-Dimer No results for input(s): DDIMER in the last 72 hours. Hemoglobin A1C Recent Labs    08/22/19 1803  HGBA1C 6.9*   Fasting Lipid Panel No results for input(s): CHOL, HDL, LDLCALC, TRIG, CHOLHDL, LDLDIRECT in the last 72 hours. Thyroid Function Tests No results for input(s): TSH, T4TOTAL, T3FREE,  THYROIDAB in the last 72 hours.  Invalid input(s): FREET3 _____________  CARDIAC CATHETERIZATION  Result Date: 08/22/2019  LESION #1: Prox RCA to Mid RCA lesion is 65-70% stenosed.  Orbital atherectomy was performed on the entire segment.  A drug-eluting stent was successfully placed covering the entire lesion, using a STENT RESOLUTE ONYX 4.0X38 -> this was postdilated with a 4.5 mm balloon to roughly 4.6 mm.  Post intervention, there is a 0% residual stenosis.  Previously placed Dist RCA stent is 5% stenosed.  ----------------------------------------  Prox Cx lesion is 85% stenosed. Prox Cx to Mid Cx lesion is 70% stenosed (this lesion actually became identifiable with angioplasty of the ostial lesion)  Scoring balloon angioplasty was performed using a BALLOON WOLVERINE 2.50X10 -followed by a 2.5 mm x 26mm [place] balloon  Stent was not delivered due to inability to fully expand the balloon in the 70% lesion.  Post intervention, there is a 45% residual stenosis in the original identified 85% lesion.  Post intervention, there is a 50% residual stenosis in the newly identified 70% more calcified lesion..  -Downstream: Dist Cx lesion is 100% stenosed. 3rd Mrg lesion is 100% stenosed.  ----------------------------------------  LV end diastolic pressure  is normal. There is no aortic valve stenosis.  SUMMARY  Successful orbital atherectomy based PCI of the proximal to mid RCA-> Resolute Onyx DES 4.0 mm x 38 mm postdilated to 4.5 mm)  Scoring balloon angioplasty only of proximal LCx (unable to advance wire adequately to perform atherectomy) -> reduced from 85% to 50%.  Normal LVEDP  Successful temporary pacemaker placement RECOMMENDATIONS  Overnight monitoring for post PCI care and sheath removal.  We will restart Eliquis tonight  He is on statin and beta-blocker.  Will adjust medicines accordingly.  Provided he has improvement of symptoms, would just continue with PTCA only of the Circumflex as  there was a suboptimal but fairly good result reducing 85% down to 50% stenosis. He will follow up with Dr. Rose Phi, MD  Disposition   Pt is being discharged home today in good condition.  Follow-up Plans & Appointments    Follow-up Information    Revankar, Reita Cliche, MD Follow up on 08/30/2019.   Specialty: Cardiology Why: at 8:40am for your follow up appt.  Contact information: [address] [place] [address] [phone]          Discharge Instructions    Amb Referral to Cardiac Rehabilitation   Complete by: As directed    Will refer to Cardiac Rehab Phase 2 [place]   Diagnosis: Coronary Stents   After initial evaluation and assessments completed: Virtual Based Care may be provided alone or in conjunction with Phase 2 Cardiac Rehab based on patient barriers.: Yes   Call MD for:  redness, tenderness, or signs of infection (pain, swelling, redness, odor or green/yellow discharge around incision site)   Complete by: As directed    Diet - low sodium heart healthy   Complete by: As directed    Discharge instructions   Complete by: As directed    Groin Site Care Refer to this sheet in the next few weeks. These instructions provide you with information on caring for yourself after your procedure. Your caregiver may also give you more specific instructions. Your treatment has been planned according to current medical practices, but problems sometimes occur. Call your caregiver if you have any problems or questions after your procedure. HOME CARE INSTRUCTIONS You may shower 24 hours after the procedure. Remove the bandage (dressing) and gently wash the site with plain soap and water. Gently pat the site dry.  Do not apply powder or lotion to the site.  Do not sit in a bathtub, swimming pool, or whirlpool for 5 to 7 days.  No bending, squatting, or lifting anything over 10 pounds (4.5 kg) as directed by your caregiver.  Inspect the site at least twice  daily.  Do not drive home if you are discharged the same Juan of the procedure. Have someone else drive you.  You may drive 24 hours after the procedure unless otherwise instructed by your caregiver.  What to expect: Any bruising will usually fade within 1 to 2 weeks.  Blood that collects in the tissue (hematoma) may be painful to the touch. It should usually decrease in size and tenderness within 1 to 2 weeks.  SEEK IMMEDIATE MEDICAL CARE IF: You have unusual pain at the groin site or down the affected leg.  You have redness, warmth, swelling, or pain at the groin site.  You have drainage (other than a small amount of blood on the dressing).  You have chills.  You have a fever or persistent symptoms for more than 72 hours.  You have a fever and your symptoms suddenly get worse.  Your leg becomes pale, cool, tingly, or numb.  You have heavy bleeding from the site. Hold pressure on the site. Marland Kitchen  PLEASE DO NOT MISS ANY DOSES OF YOUR PLAVIX!!!!! Also keep a log of you blood pressures and bring back to your follow up appt. Please call the office with any questions.   Patients taking blood thinners should generally stay away from medicines like ibuprofen, Advil, Motrin, naproxen, and Aleve due to risk of stomach bleeding. You may take Tylenol as directed or talk to your primary doctor about alternatives.   Increase activity slowly   Complete by: As directed        Discharge Medications     Medication List    TAKE these medications   [place] 0.025 % ophthalmic solution Generic drug: ketotifen Place 1 drop into both eyes 2 (two) times daily as needed (allergy/irritated eyes.).   albuterol (2.5 MG/3ML) 0.083% nebulizer solution Commonly known as: PROVENTIL Take 3 mLs (2.5 mg total) by nebulization every 6 (six) hours as needed for up to 4 doses for wheezing or shortness of breath.   albuterol 108 (90 Base) MCG/ACT inhaler Commonly known as: VENTOLIN HFA Inhale 2 puffs into the lungs  every 6 (six) hours as needed for wheezing or shortness of breath.   allopurinol 300 MG tablet Commonly known as: ZYLOPRIM Take 300 mg by mouth daily.   apixaban 5 MG Tabs tablet Commonly known as: Eliquis Take 1 tablet (5 mg total) by mouth 2 (two) times daily.   atorvastatin 40 MG tablet Commonly known as: LIPITOR Take 40 mg by mouth every evening.   CALCIUM 600+D3 PO Take 1 tablet by mouth daily.   clopidogrel 75 MG tablet Commonly known as: PLAVIX Take 1 tablet (75 mg total) by mouth daily.   Fish Oil 1200 MG Caps Take 1,200 mg by mouth daily.   furosemide 20 MG tablet Commonly known as: Lasix Take 20 mg (1 Tab) every other Juan What changed:   how much to take  how to take this  when to take this  additional instructions   glipiZIDE 2.5 MG 24 hr tablet Commonly known as: GLUCOTROL XL Take 2.5 mg by mouth daily with breakfast.   HOMEOPATHIC PRODUCTS EX Apply 1 application topically 2 (two) times daily as needed (diabetic pain.). Topricin Foot Pain Relief Cream   HYDROcodone-acetaminophen 7.5-325 mg/15 ml solution Commonly known as: HYCET Take 1 mL by mouth 2 (two) times daily as needed for pain.   ipratropium 0.03 % nasal spray Commonly known as: ATROVENT Place 2 sprays into both nostrils every 12 (twelve) hours as needed for rhinitis.   metoprolol succinate 25 MG 24 hr tablet Commonly known as: TOPROL-XL Take 12.5 mg by mouth daily.   nitroGLYCERIN 0.4 MG SL tablet Commonly known as: NITROSTAT DISSOLVE 1 TABLET UNDER THE TONGUE EVERY 5 MINUTES AS NEEDED FOR CHEST PAIN What changed:   how much to take  how to take this  when to take this  reasons to take this  additional instructions   potassium gluconate 595 (99 K) MG Tabs tablet Take 595 mg by mouth every other Juan.   Systane 0.4-0.3 % Soln Generic drug: Polyethyl Glycol-Propyl Glycol Place 1 drop into both eyes 2 (two) times daily as needed (dry/irritated eyes).   tamsulosin 0.4 MG  Caps capsule Commonly known as: FLOMAX Take 0.4 mg by mouth daily after supper.   traZODone 150 MG tablet Commonly  known as: DESYREL Take 150 mg by mouth at bedtime.   Trelegy Ellipta 100-62.5-25 MCG/INH Aepb Generic drug: Fluticasone-Umeclidin-Vilant Inhale 1 puff into the lungs daily. What changed: when to take this   triamcinolone cream 0.1 % Commonly known as: KENALOG Apply 1 application topically daily.   vitamin C 1000 MG tablet Take 1,000 mg by mouth daily.   Vitamin D3 50 MCG (2000 UT) Tabs Take 2,000 Units by mouth daily.        No                               Did the patient have a percutaneous coronary intervention (stent / angioplasty)?:  Yes.     Cath/PCI Registry Performance & Quality Measures: 5. Aspirin prescribed? - No - On Eliquis, planned for plavix no ASA 6. ADP Receptor Inhibitor (Plavix/Clopidogrel, Brilinta/Ticagrelor or Effient/Prasugrel) prescribed (includes medically managed patients)? - Yes 7. High Intensity Statin (Lipitor 40-80mg  or Crestor 20-40mg ) prescribed? - Yes 8. For EF <40%, was ACEI/ARB prescribed? - Not Applicable (EF >/= 16%) 9. For EF <40%, Aldosterone Antagonist (Spironolactone or Eplerenone) prescribed? - Not Applicable (EF >/= 60%) 10. Cardiac Rehab Phase II ordered (Included Medically managed Patients)? - Yes   Outstanding Labs/Studies   N/a   Duration of Discharge Encounter   Greater than 30 minutes including physician time.  Signed, Reino Bellis NP-C 08/23/2019, 9:26 AM   I have personally seen and examined this patient. I agree with the assessment and plan as outlined above. He is s/p atherectomy and stenting of the RCA yesterday as well as scoring balloon angioplasty of the Circumflex. Doing well this am. No chest pain. Mild dyspnea that was present for the last few weeks. No cough. He will be discharged on Plavix and Eliquis.   Lauree Chandler  08/23/2019 9:30 AM

## 2019-08-23 NOTE — Progress Notes (Signed)
CARDIAC REHAB PHASE I   Offered to walk with pt, pt declined. States he walked last night. Still c/o some SOB. Pt educated on importance of Plavix and Eliquis. Pt given heart healthy diet, stent card at bedside. Reviewed restrictions, sit care, and exercise guidelines. Will refer to CRP II [place].  [phone] Rufina Falco, RN BSN 08/23/2019 9:08 AM

## 2019-08-27 ENCOUNTER — Inpatient Hospital Stay (HOSPITAL_COMMUNITY): Admission: RE | Admit: 2019-08-27 | Payer: Medicare Other | Source: Ambulatory Visit

## 2019-08-27 ENCOUNTER — Telehealth: Payer: Self-pay | Admitting: Cardiology

## 2019-08-27 NOTE — Telephone Encounter (Signed)
Having problems since his procedure

## 2019-08-30 ENCOUNTER — Encounter: Payer: Self-pay | Admitting: Cardiology

## 2019-08-30 ENCOUNTER — Other Ambulatory Visit: Payer: Self-pay

## 2019-08-30 ENCOUNTER — Ambulatory Visit: Payer: Medicare Other | Admitting: Primary Care

## 2019-08-30 ENCOUNTER — Ambulatory Visit (INDEPENDENT_AMBULATORY_CARE_PROVIDER_SITE_OTHER): Payer: Medicare Other | Admitting: Cardiology

## 2019-08-30 VITALS — BP 138/78 | HR 72 | Ht 69.0 in | Wt 200.8 lb

## 2019-08-30 DIAGNOSIS — Z87891 Personal history of nicotine dependence: Secondary | ICD-10-CM | POA: Diagnosis not present

## 2019-08-30 DIAGNOSIS — I2510 Atherosclerotic heart disease of native coronary artery without angina pectoris: Secondary | ICD-10-CM | POA: Diagnosis not present

## 2019-08-30 DIAGNOSIS — I10 Essential (primary) hypertension: Secondary | ICD-10-CM

## 2019-08-30 DIAGNOSIS — I4891 Unspecified atrial fibrillation: Secondary | ICD-10-CM | POA: Diagnosis not present

## 2019-08-30 DIAGNOSIS — E782 Mixed hyperlipidemia: Secondary | ICD-10-CM

## 2019-08-30 DIAGNOSIS — Z9861 Coronary angioplasty status: Secondary | ICD-10-CM | POA: Diagnosis not present

## 2019-08-30 DIAGNOSIS — E088 Diabetes mellitus due to underlying condition with unspecified complications: Secondary | ICD-10-CM

## 2019-08-30 NOTE — Progress Notes (Signed)
Cardiology Office Note:    Date:  08/30/2019   ID:  Juan Valdez, DOB 1946-04-12, MRN [id number]  PCP:  Patient, No Pcp Per  Cardiologist:  Jenean Lindau, MD   Referring MD: No ref. provider found    ASSESSMENT:    1. CAD S/P percutaneous coronary angioplasty   2. Atrial fibrillation, unspecified type ([place])   3. Essential hypertension   4. Mixed dyslipidemia   5. Former smoker   6. Diabetes mellitus due to underlying condition with unspecified complications ([place])    PLAN:    In order of problems listed above:  1. Coronary artery disease: Secondary prevention stressed with the patient.  Importance of compliance with diet and medication stressed and he vocalized understanding.  Importance of regular exercise stressed and he vocalized understanding. 2. Atrial fibrillation:I discussed with the patient atrial fibrillation, disease process. Management and therapy including rate and rhythm control, anticoagulation benefits and potential risks were discussed extensively with the patient. Patient had multiple questions which were answered to patient's satisfaction. 3. Essential hypertension: Blood pressure stable 4. Mixed dyslipidemia and diabetes mellitus: Diet was discussed.  Importance of regular exercise stressed.  He promises to do better.  He will have all blood work today.  Including CBC. 5. Patient will be seen in follow-up appointment in 2 months or earlier if the patient has any concerns    Medication Adjustments/Labs and Tests Ordered: Current medicines are reviewed at length with the patient today.  Concerns regarding medicines are outlined above.  No orders of the defined types were placed in this encounter.  No orders of the defined types were placed in this encounter.    No chief complaint on file.    History of Present Illness:    Juan Valdez is a 73 y.o. male.  Patient has past medical history of coronary artery disease.  He has undergone staged  stenting.  He feels much better now.  No shortness of breath.  No chest pain orthopnea or PND.  At the time of my evaluation, the patient is alert awake oriented and in no distress.  He is walking some on a regular basis.  Past Medical History:  Diagnosis Date  . Anemia   . Atrial fibrillation ([place]) 09/07/2018  . CAD (coronary artery disease)    a. s/p prior DES to RCA b. cath in 07/2019 showing 75% Prox to mid RCA stenosis, 5% ISR along previously placed distal RCA stent, 85% Prox LCx stenosis, and 100% CTO of distal LCx with filled from L--> L collaterals. Also had a long-diffuse 65-75% stenosis along proximal to mid-LAD. Unable to fully expand balloon for PCI of RCA and lesion reduced to 65%. Future atherectotmy of RCA and LCx.   . Colon polyp   . COPD (chronic obstructive pulmonary disease) ([place])   . Diabetes ([place])   . Diverticulosis   . GERD (gastroesophageal reflux disease)   . Gout   . HLD (hyperlipidemia)   . Lung cancer ([place])   . Perforated bowel [facility])     Past Surgical History:  Procedure Laterality Date  . ABDOMINAL SURGERY    . CORONARY ANGIOPLASTY WITH STENT PLACEMENT  2003  . CORONARY ATHERECTOMY N/A 08/22/2019   Procedure: CORONARY ATHERECTOMY;  Surgeon: Leonie Man, MD;  Location: [place] [facility];  Service: Cardiovascular;  Laterality: N/A;  . CORONARY BALLOON ANGIOPLASTY N/A 07/20/2019   Procedure: CORONARY BALLOON ANGIOPLASTY;  Surgeon: Leonie Man, MD;  Location: [place] [facility];  Service: Cardiovascular;  Laterality: N/A;  RCA  . CORONARY BALLOON ANGIOPLASTY N/A 08/22/2019   Procedure: CORONARY BALLOON ANGIOPLASTY;  Surgeon: Leonie Man, MD;  Location: [place] [facility];  Service: Cardiovascular;  Laterality: N/A;  . CORONARY STENT INTERVENTION  08/22/2019  . CORONARY STENT INTERVENTION N/A 08/22/2019   Procedure: CORONARY STENT INTERVENTION;  Surgeon: Leonie Man, MD;  Location: [place] [facility];  Service: Cardiovascular;  Laterality: N/A;    . LEFT HEART CATH AND CORONARY ANGIOGRAPHY N/A 07/20/2019   Procedure: LEFT HEART CATH AND CORONARY ANGIOGRAPHY;  Surgeon: Leonie Man, MD;  Location: [place] [facility];  Service: Cardiovascular;  Laterality: N/A;  . LEFT HEART CATH AND CORONARY ANGIOGRAPHY N/A 08/22/2019   Procedure: LEFT HEART CATH AND CORONARY ANGIOGRAPHY;  Surgeon: Leonie Man, MD;  Location: [place] [facility];  Service: Cardiovascular;  Laterality: N/A;  . LUNG CANCER SURGERY    . ROTATOR CUFF REPAIR Left   . TEMPORARY PACEMAKER N/A 08/22/2019   Procedure: TEMPORARY PACEMAKER;  Surgeon: Leonie Man, MD;  Location: [place] [facility];  Service: Cardiovascular;  Laterality: N/A;  . TONSILLECTOMY     age 19    Current Medications: Current Meds  Medication Sig  . ALAWAY 0.025 % ophthalmic solution Place 1 drop into both eyes 2 (two) times daily as needed (allergy/irritated eyes.).  Marland Kitchen albuterol (PROVENTIL) (2.5 MG/3ML) 0.083% nebulizer solution Take 3 mLs (2.5 mg total) by nebulization every 6 (six) hours as needed for up to 4 doses for wheezing or shortness of breath.  Marland Kitchen albuterol (VENTOLIN HFA) 108 (90 Base) MCG/ACT inhaler Inhale 2 puffs into the lungs every 6 (six) hours as needed for wheezing or shortness of breath.  . allopurinol (ZYLOPRIM) 300 MG tablet Take 300 mg by mouth daily.  Marland Kitchen apixaban (ELIQUIS) 5 MG TABS tablet Take 1 tablet (5 mg total) by mouth 2 (two) times daily.  . Ascorbic Acid (VITAMIN C) 1000 MG tablet Take 1,000 mg by mouth daily.  Marland Kitchen atorvastatin (LIPITOR) 40 MG tablet Take 40 mg by mouth every evening.   . Calcium Carb-Cholecalciferol (CALCIUM 600+D3 PO) Take 1 tablet by mouth daily.  . Cholecalciferol (VITAMIN D3) 50 MCG (2000 UT) TABS Take 2,000 Units by mouth daily.  . clopidogrel (PLAVIX) 75 MG tablet Take 1 tablet (75 mg total) by mouth daily.  . Fluticasone-Umeclidin-Vilant (TRELEGY ELLIPTA) 100-62.5-25 MCG/INH AEPB Inhale 1 puff into the lungs daily. (Patient taking differently:  Inhale 1 puff into the lungs every evening. )  . furosemide (LASIX) 20 MG tablet Take 20 mg (1 Tab) every other day (Patient taking differently: Take 10 mg by mouth every other day. )  . glipiZIDE (GLUCOTROL XL) 2.5 MG 24 hr tablet Take 2.5 mg by mouth daily with breakfast.  . HOMEOPATHIC PRODUCTS EX Apply 1 application topically 2 (two) times daily as needed (diabetic pain.). Topricin Foot Pain Relief Cream  . HYDROcodone-acetaminophen (HYCET) 7.5-325 mg/15 ml solution Take 1 mL by mouth 2 (two) times daily as needed for pain.  Marland Kitchen ipratropium (ATROVENT) 0.03 % nasal spray Place 2 sprays into both nostrils every 12 (twelve) hours as needed for rhinitis.  . metoprolol succinate (TOPROL-XL) 25 MG 24 hr tablet Take 12.5 mg by mouth daily.   . nitroGLYCERIN (NITROSTAT) 0.4 MG SL tablet DISSOLVE 1 TABLET UNDER THE TONGUE EVERY 5 MINUTES AS NEEDED FOR CHEST PAIN (Patient taking differently: Place 0.4 mg under the tongue every 5 (five) minutes as needed for chest pain. )  .  Omega-3 Fatty Acids (FISH OIL) 1200 MG CAPS Take 1,200 mg by mouth daily.  Vladimir Faster Glycol-Propyl Glycol (SYSTANE) 0.4-0.3 % SOLN Place 1 drop into both eyes 2 (two) times daily as needed (dry/irritated eyes).  . potassium gluconate 595 (99 K) MG TABS tablet Take 595 mg by mouth every other day.  . tamsulosin (FLOMAX) 0.4 MG CAPS capsule Take 0.4 mg by mouth daily after supper.   . traZODone (DESYREL) 150 MG tablet Take 150 mg by mouth at bedtime.   . triamcinolone cream (KENALOG) 0.1 % Apply 1 application topically daily.     Allergies:   Patient has no known allergies.   Social History   Socioeconomic History  . Marital status: Widowed    Spouse name: Not on file  . Number of children: 0  . Years of education: Not on file  . Highest education level: Not on file  Occupational History  . Occupation: retired  Tobacco Use  . Smoking status: Former Smoker    Packs/day: 3.00    Years: 57.00    Pack years: 171.00    Types:  Cigarettes    Start date: 50    Quit date: 06/13/2014    Years since quitting: 5.2  . Smokeless tobacco: Never Used  Substance and Sexual Activity  . Alcohol use: Not Currently  . Drug use: Never  . Sexual activity: Not on file  Other Topics Concern  . Not on file  Social History Narrative  . Not on file   Social Determinants of Health   Financial Resource Strain:   . Difficulty of Paying Living Expenses: Not on file  Food Insecurity:   . Worried About Charity fundraiser in the Last Year: Not on file  . Ran Out of Food in the Last Year: Not on file  Transportation Needs:   . Lack of Transportation (Medical): Not on file  . Lack of Transportation (Non-Medical): Not on file  Physical Activity:   . Days of Exercise per Week: Not on file  . Minutes of Exercise per Session: Not on file  Stress:   . Feeling of Stress : Not on file  Social Connections:   . Frequency of Communication with Friends and Family: Not on file  . Frequency of Social Gatherings with Friends and Family: Not on file  . Attends Religious Services: Not on file  . Active Member of Clubs or Organizations: Not on file  . Attends Archivist Meetings: Not on file  . Marital Status: Not on file     Family History: The patient's family history includes Cancer in his father. He was adopted.  ROS:   Please see the history of present illness.    All other systems reviewed and are negative.  EKGs/Labs/Other Studies Reviewed:    The following studies were reviewed today: Study date: 08/22/19  MyChart Results Release  MyChart Status: Active Results Release  Physicians  Panel Physicians Referring Physician Case Authorizing Physician  Leonie Man, MD (Primary)    Procedures  CORONARY BALLOON ANGIOPLASTY  CORONARY STENT INTERVENTION  CORONARY ATHERECTOMY  LEFT HEART CATH AND CORONARY ANGIOGRAPHY  TEMPORARY PACEMAKER  Conclusion    LESION #1: Prox RCA to Mid RCA lesion is 65-70%  stenosed.  Orbital atherectomy was performed on the entire segment.  A drug-eluting stent was successfully placed covering the entire lesion, using a STENT RESOLUTE ONYX 4.0X38 -> this was postdilated with a 4.5 mm balloon to roughly 4.6 mm.  Post intervention,  there is a 0% residual stenosis.  Previously placed Dist RCA stent is 5% stenosed.  ----------------------------------------  Prox Cx lesion is 85% stenosed. Prox Cx to Mid Cx lesion is 70% stenosed (this lesion actually became identifiable with angioplasty of the ostial lesion)  Scoring balloon angioplasty was performed using a BALLOON WOLVERINE 2.50X10 -followed by a 2.5 mm x 65mm [place] balloon  Stent was not delivered due to inability to fully expand the balloon in the 70% lesion.  Post intervention, there is a 45% residual stenosis in the original identified 85% lesion.  Post intervention, there is a 50% residual stenosis in the newly identified 70% more calcified lesion..  -Downstream: Dist Cx lesion is 100% stenosed. 3rd Mrg lesion is 100% stenosed.  ----------------------------------------  LV end diastolic pressure is normal. There is no aortic valve stenosis.   SUMMARY  Successful orbital atherectomy based PCI of the proximal to mid RCA-> Resolute Onyx DES 4.0 mm x 38 mm postdilated to 4.5 mm)  Scoring balloon angioplasty only of proximal LCx (unable to advance wire adequately to perform atherectomy) -> reduced from 85% to 50%.  Normal LVEDP  Successful temporary pacemaker placement   RECOMMENDATIONS  Overnight monitoring for post PCI care and sheath removal.  We will restart Eliquis tonight  He is on statin and beta-blocker.  Will adjust medicines accordingly.  Provided he has improvement of symptoms, would just continue with PTCA only of the Circumflex as there was a suboptimal but fairly good result reducing 85% down to 50% stenosis.   He will follow up with Dr. Rose Phi, MD        Recent Labs: 03/08/2019: TSH 1.080 05/11/2019: ALT 18; Magnesium 1.9 07/08/2019: B Natriuretic Peptide 87.7 08/23/2019: BUN 15; Creatinine, Ser 1.29; Hemoglobin 14.9; Platelets 109; Potassium 4.2; Sodium 141  Recent Lipid Panel    Component Value Date/Time   CHOL 140 03/08/2019 0907   TRIG 49 03/08/2019 0907   HDL 44 03/08/2019 0907   CHOLHDL 3.2 03/08/2019 0907   LDLCALC 86 03/08/2019 0907    Physical Exam:    VS:  BP 138/78   Pulse 72   Ht 5\' 9"  (1.753 m)   Wt 200 lb 12.8 oz (91.1 kg)   BMI 29.65 kg/m     Wt Readings from Last 3 Encounters:  08/30/19 200 lb 12.8 oz (91.1 kg)  08/23/19 195 lb 12.8 oz (88.8 kg)  08/06/19 196 lb (88.9 kg)     GEN: Patient is in no acute distress HEENT: Normal NECK: No JVD; No carotid bruits LYMPHATICS: No lymphadenopathy CARDIAC: Hear sounds regular, 2/6 systolic murmur at the apex. RESPIRATORY:  Clear to auscultation without rales, wheezing or rhonchi  ABDOMEN: Soft, non-tender, non-distended MUSCULOSKELETAL:  No edema; No deformity.  Patient has significant bruising in the groin but bilateral femoral pulses are equally well felt.  He has no pain or tenderness or any redness.  No signs of any inflammation.  Or infection. SKIN: Warm and dry NEUROLOGIC:  Alert and oriented x 3 PSYCHIATRIC:  Normal affect   Signed, Jenean Lindau, MD  08/30/2019 8:36 AM    [place]

## 2019-08-30 NOTE — Patient Instructions (Signed)
Medication Instructions:  Your physician recommends that you continue on your current medications as directed. Please refer to the Current Medication list given to you today.  *If you need a refill on your cardiac medications before your next appointment, please call your pharmacy*  Lab Work: Your physician recommends that you have lab work TODAY  BMET Lipids LFT's  If you have labs (blood work) drawn today and your tests are completely normal, you will receive your results only by: Marland Kitchen MyChart Message (if you have MyChart) OR . A paper copy in the mail If you have any lab test that is abnormal or we need to change your treatment, we will call you to review the results.  Testing/Procedures: None Ordered  Follow-Up: At [facility], you and your health needs are our priority.  As part of our continuing mission to provide you with exceptional heart care, we have created designated Provider Care Teams.  These Care Teams include your primary Cardiologist (physician) and Advanced Practice Providers (APPs -  Physician Assistants and Nurse Practitioners) who all work together to provide you with the care you need, when you need it.  Your next appointment:   2 month(s)  The format for your next appointment:   In Person  Provider:   Jyl Heinz, MD

## 2019-08-31 LAB — HEPATIC FUNCTION PANEL
ALT: 13 IU/L (ref 0–44)
AST: 15 IU/L (ref 0–40)
Albumin: 4.6 g/dL (ref 3.7–4.7)
Alkaline Phosphatase: 128 IU/L — ABNORMAL HIGH (ref 39–117)
Bilirubin Total: 1.2 mg/dL (ref 0.0–1.2)
Bilirubin, Direct: 0.32 mg/dL (ref 0.00–0.40)
Total Protein: 6.7 g/dL (ref 6.0–8.5)

## 2019-08-31 LAB — BASIC METABOLIC PANEL
BUN/Creatinine Ratio: 16 (ref 10–24)
BUN: 19 mg/dL (ref 8–27)
CO2: 28 mmol/L (ref 20–29)
Calcium: 9.9 mg/dL (ref 8.6–10.2)
Chloride: 100 mmol/L (ref 96–106)
Creatinine, Ser: 1.16 mg/dL (ref 0.76–1.27)
GFR calc Af Amer: 71 mL/min/{1.73_m2} (ref >59)
GFR calc non Af Amer: 61 mL/min/{1.73_m2} (ref >59)
Glucose: 144 mg/dL — ABNORMAL HIGH (ref 65–99)
Potassium: 4.4 mmol/L (ref 3.5–5.2)
Sodium: 140 mmol/L (ref 134–144)

## 2019-08-31 LAB — CBC
Hematocrit: 42.5 % (ref 37.5–51.0)
Hemoglobin: 14.6 g/dL (ref 13.0–17.7)
MCH: 30.5 pg (ref 26.6–33.0)
MCHC: 34.4 g/dL (ref 31.5–35.7)
MCV: 89 fL (ref 79–97)
Platelets: 134 10*3/uL — ABNORMAL LOW (ref 150–450)
RBC: 4.79 x10E6/uL (ref 4.14–5.80)
RDW: 14.4 % (ref 11.6–15.4)
WBC: 7 10*3/uL (ref 3.4–10.8)

## 2019-08-31 LAB — LIPID PANEL
Chol/HDL Ratio: 3.5 ratio (ref 0.0–5.0)
Cholesterol, Total: 128 mg/dL (ref 100–199)
HDL: 37 mg/dL — ABNORMAL LOW (ref >39)
LDL Chol Calc (NIH): 66 mg/dL (ref 0–99)
Triglycerides: 145 mg/dL (ref 0–149)
VLDL Cholesterol Cal: 25 mg/dL (ref 5–40)

## 2019-09-03 ENCOUNTER — Encounter: Payer: Self-pay | Admitting: *Deleted

## 2019-09-03 ENCOUNTER — Telehealth: Payer: Self-pay | Admitting: *Deleted

## 2019-09-03 NOTE — Telephone Encounter (Signed)
Depending on where he lives and to which office he is closer he needs to be brought in for the nurse to evaluate his leg.  If there is any suspicion of significant issue then Herbie Baltimore or me can take a look and do the needful.

## 2019-09-03 NOTE — Telephone Encounter (Signed)
Called patient to give lab results. States has been having swelling in his R foot/leg since he had stent procedure on 08/22/19. Has been wearing compression stockings and taking his Lasix 20 mg daily with no improvement. Please advise.

## 2019-09-03 NOTE — Telephone Encounter (Signed)
Telephone call back to patient. Informed him and his wife that Dr. Geraldo Pitter states he needs to be seen. Unable to go to [facility]. Made appointment for 09/05/19 with dr Agustin Cree to evaluate

## 2019-09-04 NOTE — Telephone Encounter (Signed)
Patient scheduled for f/u

## 2019-09-05 ENCOUNTER — Other Ambulatory Visit: Payer: Self-pay

## 2019-09-05 ENCOUNTER — Ambulatory Visit (INDEPENDENT_AMBULATORY_CARE_PROVIDER_SITE_OTHER): Payer: Medicare Other | Admitting: Cardiology

## 2019-09-05 ENCOUNTER — Ambulatory Visit (INDEPENDENT_AMBULATORY_CARE_PROVIDER_SITE_OTHER): Payer: Medicare Other

## 2019-09-05 ENCOUNTER — Encounter: Payer: Self-pay | Admitting: Cardiology

## 2019-09-05 VITALS — BP 120/68 | HR 61 | Ht 69.0 in | Wt 190.0 lb

## 2019-09-05 DIAGNOSIS — I2510 Atherosclerotic heart disease of native coronary artery without angina pectoris: Secondary | ICD-10-CM | POA: Diagnosis not present

## 2019-09-05 DIAGNOSIS — T81718A Complication of other artery following a procedure, not elsewhere classified, initial encounter: Secondary | ICD-10-CM

## 2019-09-05 DIAGNOSIS — E782 Mixed hyperlipidemia: Secondary | ICD-10-CM

## 2019-09-05 DIAGNOSIS — I4821 Permanent atrial fibrillation: Secondary | ICD-10-CM

## 2019-09-05 DIAGNOSIS — I25119 Atherosclerotic heart disease of native coronary artery with unspecified angina pectoris: Secondary | ICD-10-CM

## 2019-09-05 DIAGNOSIS — R0609 Other forms of dyspnea: Secondary | ICD-10-CM

## 2019-09-05 DIAGNOSIS — I729 Aneurysm of unspecified site: Secondary | ICD-10-CM

## 2019-09-05 DIAGNOSIS — R0600 Dyspnea, unspecified: Secondary | ICD-10-CM | POA: Diagnosis not present

## 2019-09-05 DIAGNOSIS — R600 Localized edema: Secondary | ICD-10-CM

## 2019-09-05 DIAGNOSIS — Z9861 Coronary angioplasty status: Secondary | ICD-10-CM

## 2019-09-05 DIAGNOSIS — R609 Edema, unspecified: Secondary | ICD-10-CM | POA: Diagnosis not present

## 2019-09-05 NOTE — Progress Notes (Unsigned)
Unilateral right lower extremity venous exam has been performed. No DVT was seen. 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 Louretta Shorten Zela Sobieski RDCS, RVT

## 2019-09-05 NOTE — Patient Instructions (Signed)
Medication Instructions:  Your physician recommends that you continue on your current medications as directed. Please refer to the Current Medication list given to you today.  *If you need a refill on your cardiac medications before your next appointment, please call your pharmacy*  Lab Work: None.  If you have labs (blood work) drawn today and your tests are completely normal, you will receive your results only by: Marland Kitchen MyChart Message (if you have MyChart) OR . A paper copy in the mail If you have any lab test that is abnormal or we need to change your treatment, we will call you to review the results.  Testing/Procedures: You will have a groin ultrasound done.   Follow-Up: At [facility], you and your health needs are our priority.  As part of our continuing mission to provide you with exceptional heart care, we have created designated Provider Care Teams.  These Care Teams include your primary Cardiologist (physician) and Advanced Practice Providers (APPs -  Physician Assistants and Nurse Practitioners) who all work together to provide you with the care you need, when you need it.  Your next appointment:    You already have follow up    Other Instructions '

## 2019-09-05 NOTE — Progress Notes (Signed)
Cardiology Office Note:    Date:  09/05/2019   ID:  Juan Valdez, DOB 1946-03-09, MRN [id number]  PCP:  Patient, No Pcp Per  Cardiologist:  Jenne Campus, MD    Referring MD: No ref. provider found   Chief Complaint  Patient presents with   Edema    Right leg, after cath/stent, went in through the groin    History of Present Illness:    Juan Valdez is a 73 y.o. male with past medical history significant for PTCA and stenting and atherectomy of right coronary and  artery done on 08/22/2019.  Procedure was uneventful and after that he was doing quite well but started complaining of having pain and swelling in the right lower extremities.  He try to leave his leg up and try to take some diuretic with limited success.  He was sent back to our office for evaluation.  Cardiac wise doing well denies have any chest pain tightness squeezing pressure burning chest  Past Medical History:  Diagnosis Date   Anemia    Atrial fibrillation ([place]) 09/07/2018   CAD (coronary artery disease)    a. s/p prior DES to RCA b. cath in 07/2019 showing 75% Prox to mid RCA stenosis, 5% ISR along previously placed distal RCA stent, 85% Prox LCx stenosis, and 100% CTO of distal LCx with filled from L--> L collaterals. Also had a long-diffuse 65-75% stenosis along proximal to mid-LAD. Unable to fully expand balloon for PCI of RCA and lesion reduced to 65%. Future atherectotmy of RCA and LCx.    Colon polyp    COPD (chronic obstructive pulmonary disease) (HCC)    Diabetes (HCC)    Diverticulosis    GERD (gastroesophageal reflux disease)    Gout    HLD (hyperlipidemia)    Lung cancer ([place])    Perforated bowel ([place])     Past Surgical History:  Procedure Laterality Date   ABDOMINAL SURGERY     CORONARY ANGIOPLASTY WITH STENT PLACEMENT  2003   CORONARY ATHERECTOMY N/A 08/22/2019   Procedure: CORONARY ATHERECTOMY;  Surgeon: Leonie Man, MD;  Location: [facility];  Service:  Cardiovascular;  Laterality: N/A;   CORONARY BALLOON ANGIOPLASTY N/A 07/20/2019   Procedure: CORONARY BALLOON ANGIOPLASTY;  Surgeon: Leonie Man, MD;  Location: [facility];  Service: Cardiovascular;  Laterality: N/A;  RCA   CORONARY BALLOON ANGIOPLASTY N/A 08/22/2019   Procedure: CORONARY BALLOON ANGIOPLASTY;  Surgeon: Leonie Man, MD;  Location: [facility];  Service: Cardiovascular;  Laterality: N/A;   CORONARY STENT INTERVENTION  08/22/2019   CORONARY STENT INTERVENTION N/A 08/22/2019   Procedure: CORONARY STENT INTERVENTION;  Surgeon: Leonie Man, MD;  Location: [facility];  Service: Cardiovascular;  Laterality: N/A;   LEFT HEART CATH AND CORONARY ANGIOGRAPHY N/A 07/20/2019   Procedure: LEFT HEART CATH AND CORONARY ANGIOGRAPHY;  Surgeon: Leonie Man, MD;  Location: [facility];  Service: Cardiovascular;  Laterality: N/A;   LEFT HEART CATH AND CORONARY ANGIOGRAPHY N/A 08/22/2019   Procedure: LEFT HEART CATH AND CORONARY ANGIOGRAPHY;  Surgeon: Leonie Man, MD;  Location: [place] [facility] [facility];  Service: Cardiovascular;  Laterality: N/A;   LUNG CANCER SURGERY     ROTATOR CUFF REPAIR Left    TEMPORARY PACEMAKER N/A 08/22/2019   Procedure: TEMPORARY PACEMAKER;  Surgeon: Leonie Man, MD;  Location: [facility];  Service: Cardiovascular;  Laterality: N/A;   TONSILLECTOMY  age 18    Current Medications: Current Meds  Medication Sig   ALAWAY 0.025 % ophthalmic solution Place 1 drop into both eyes 2 (two) times daily as needed (allergy/irritated eyes.).   albuterol (PROVENTIL) (2.5 MG/3ML) 0.083% nebulizer solution Take 3 mLs (2.5 mg total) by nebulization every 6 (six) hours as needed for up to 4 doses for wheezing or shortness of breath.   albuterol (VENTOLIN HFA) 108 (90 Base) MCG/ACT inhaler Inhale 2 puffs into the lungs every 6 (six) hours as needed for wheezing or shortness of breath.   allopurinol (ZYLOPRIM) 300 MG tablet Take 300  mg by mouth daily.   apixaban (ELIQUIS) 5 MG TABS tablet Take 1 tablet (5 mg total) by mouth 2 (two) times daily.   Ascorbic Acid (VITAMIN C) 1000 MG tablet Take 1,000 mg by mouth daily.   atorvastatin (LIPITOR) 40 MG tablet Take 40 mg by mouth every evening.    Calcium Carb-Cholecalciferol (CALCIUM 600+D3 PO) Take 1 tablet by mouth daily.   Cholecalciferol (VITAMIN D3) 50 MCG (2000 UT) TABS Take 2,000 Units by mouth daily.   clopidogrel (PLAVIX) 75 MG tablet Take 1 tablet (75 mg total) by mouth daily.   Fluticasone-Umeclidin-Vilant (TRELEGY ELLIPTA) 100-62.5-25 MCG/INH AEPB Inhale 1 puff into the lungs daily. (Patient taking differently: Inhale 1 puff into the lungs every evening. )   furosemide (LASIX) 20 MG tablet Take 20 mg (1 Tab) every other day (Patient taking differently: Take 10 mg by mouth every other day. )   glipiZIDE (GLUCOTROL XL) 2.5 MG 24 hr tablet Take 2.5 mg by mouth daily with breakfast.   HOMEOPATHIC PRODUCTS EX Apply 1 application topically 2 (two) times daily as needed (diabetic pain.). Topricin Foot Pain Relief Cream   HYDROcodone-acetaminophen (HYCET) 7.5-325 mg/15 ml solution Take 1 mL by mouth 2 (two) times daily as needed for pain.   ipratropium (ATROVENT) 0.03 % nasal spray Place 2 sprays into both nostrils every 12 (twelve) hours as needed for rhinitis.   metoprolol succinate (TOPROL-XL) 25 MG 24 hr tablet Take 12.5 mg by mouth daily.    nitroGLYCERIN (NITROSTAT) 0.4 MG SL tablet DISSOLVE 1 TABLET UNDER THE TONGUE EVERY 5 MINUTES AS NEEDED FOR CHEST PAIN (Patient taking differently: Place 0.4 mg under the tongue every 5 (five) minutes as needed for chest pain. )   Omega-3 Fatty Acids (FISH OIL) 1200 MG CAPS Take 1,200 mg by mouth daily.   Polyethyl Glycol-Propyl Glycol (SYSTANE) 0.4-0.3 % SOLN Place 1 drop into both eyes 2 (two) times daily as needed (dry/irritated eyes).   potassium gluconate 595 (99 K) MG TABS tablet Take 595 mg by mouth every other day.   tamsulosin  (FLOMAX) 0.4 MG CAPS capsule Take 0.4 mg by mouth daily after supper.    traZODone (DESYREL) 150 MG tablet Take 150 mg by mouth at bedtime.    triamcinolone cream (KENALOG) 0.1 % Apply 1 application topically daily.     Allergies:   Patient has no known allergies.   Social History   Socioeconomic History   Marital status: Widowed    Spouse name: Not on file   Number of children: 0   Years of education: Not on file   Highest education level: Not on file  Occupational History   Occupation: retired  Tobacco Use   Smoking status: Former Smoker    Packs/day: 3.00    Years: 57.00    Pack years: 171.00    Types: Cigarettes    Start date: 1958  Quit date: 06/13/2014    Years since quitting: 5.2   Smokeless tobacco: Never Used  Substance and Sexual Activity   Alcohol use: Not Currently   Drug use: Never   Sexual activity: Not on file  Other Topics Concern   Not on file  Social History Narrative   Not on file   Social Determinants of Health   Financial Resource Strain:    Difficulty of Paying Living Expenses: Not on file  Food Insecurity:    Worried About [place] in the Last Year: Not on file   Ran Out of Food in the Last Year: Not on file  Transportation Needs:    Lack of Transportation (Medical): Not on file   Lack of Transportation (Non-Medical): Not on file  Physical Activity:    Days of Exercise per Week: Not on file   Minutes of Exercise per Session: Not on file  Stress:    Feeling of Stress : Not on file  Social Connections:    Frequency of Communication with Friends and Family: Not on file   Frequency of Social Gatherings with Friends and Family: Not on file   Attends Religious Services: Not on file   Active Member of Clubs or Organizations: Not on file   Attends Archivist Meetings: Not on file   Marital Status: Not on file     Family History: The patient's family history includes Cancer in his father. He was adopted. ROS:   Please  see the history of present illness.    All 14 point review of systems negative except as described per history of present illness  EKGs/Labs/Other Studies Reviewed:      Recent Labs: 03/08/2019: TSH 1.080 05/11/2019: Magnesium 1.9 07/08/2019: B Natriuretic Peptide 87.7 08/30/2019: ALT 13; BUN 19; Creatinine, Ser 1.16; Hemoglobin 14.6; Platelets 134; Potassium 4.4; Sodium 140  Recent Lipid Panel    Component Value Date/Time   CHOL 128 08/30/2019 0852   TRIG 145 08/30/2019 0852   HDL 37 (L) 08/30/2019 0852   CHOLHDL 3.5 08/30/2019 0852   LDLCALC 66 08/30/2019 0852    Physical Exam:    VS:  BP 120/68   Pulse 61   Ht 5\' 9"  (1.753 m)   Wt 190 lb (86.2 kg) Comment: Reported  SpO2 95%   BMI 28.06 kg/m     Wt Readings from Last 3 Encounters:  09/05/19 190 lb (86.2 kg)  08/30/19 200 lb 12.8 oz (91.1 kg)  08/23/19 195 lb 12.8 oz (88.8 kg)     GEN:  Well nourished, well developed in no acute distress HEENT: Normal NECK: No JVD; No carotid bruits LYMPHATICS: No lymphadenopathy CARDIAC: RRR, no murmurs, no rubs, no gallops RESPIRATORY:  Clear to auscultation without rales, wheezing or rhonchi  ABDOMEN: Soft, non-tender, non-distended MUSCULOSKELETAL:  No edema; No deformity  SKIN: Warm and dry LOWER EXTREMITIES: no swelling NEUROLOGIC:  Alert and oriented x 3 PSYCHIATRIC:  Normal affect   ASSESSMENT:    1. Swelling   2. Coronary artery disease involving native coronary artery of native heart with angina pectoris ([place])   3. CAD S/P percutaneous coronary angioplasty   4. DOE (dyspnea on exertion)   5. Mixed dyslipidemia   6. Permanent atrial fibrillation (HCC)    PLAN:    In order of problems listed above:  He came complaining of pain and swelling of the right lower extremitie.  Examination of the legs show some hematoma as well as some bruises going down to  his right foot.  Somewhat tender in the groin as well as tender around the heel area.  Pulses were present but  weak distally, capillary refill was slow.  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  Neck of the aneurysm was very slow about 2.5 to 3 mm.  He was advised to take rest.  Keep his leg elevated.  We also look at his distal arteries flow was present.  DVT study has been done as well there was no evidence of DVT. Coronary disease status post PTCA and stenting.  No cardiac symptoms. Dyspnea on exertion does not walk much now because of pain in his leg Permanent atrial fibrillation we will continue anticoagulation.   Medication Adjustments/Labs and Tests Ordered: Current medicines are reviewed at length with the patient today.  Concerns regarding medicines are outlined above.  Orders Placed This Encounter  Procedures   VAS [place] UPPER EXTREMITY VENOUS DUPLEX   Medication changes: No orders of the defined types were placed in this encounter.   Signed, Park Liter, MD, [facility] 09/05/2019 4:19 PM    [place]

## 2019-09-10 DIAGNOSIS — L03115 Cellulitis of right lower limb: Secondary | ICD-10-CM | POA: Diagnosis not present

## 2019-09-11 ENCOUNTER — Telehealth: Payer: Self-pay | Admitting: Emergency Medicine

## 2019-09-11 MED ORDER — ALBUTEROL SULFATE HFA 108 (90 BASE) MCG/ACT IN AERS: 2.0000 | INHALATION_SPRAY | Freq: Four times a day (QID) | RESPIRATORY_TRACT | 1 refills | Status: DC | PRN

## 2019-09-11 MED ORDER — TRELEGY ELLIPTA 100-62.5-25 MCG/INH IN AEPB: 1.0000 | INHALATION_SPRAY | Freq: Every day | RESPIRATORY_TRACT | 1 refills | Status: AC

## 2019-09-11 NOTE — Telephone Encounter (Signed)
Spoke with pt and advised rx sent to pharmacy. Nothing further is needed.   

## 2019-09-12 ENCOUNTER — Telehealth: Payer: Self-pay

## 2019-09-12 ENCOUNTER — Other Ambulatory Visit: Payer: Self-pay | Admitting: Cardiology

## 2019-09-12 ENCOUNTER — Other Ambulatory Visit: Payer: Self-pay

## 2019-09-12 ENCOUNTER — Ambulatory Visit (HOSPITAL_BASED_OUTPATIENT_CLINIC_OR_DEPARTMENT_OTHER)
Admission: RE | Admit: 2019-09-12 | Discharge: 2019-09-12 | Disposition: A | Discharge status: Home / Self Care | Payer: Medicare Other | Source: Ambulatory Visit | Attending: Cardiology | Admitting: Cardiology

## 2019-09-12 DIAGNOSIS — I729 Aneurysm of unspecified site: Secondary | ICD-10-CM | POA: Diagnosis not present

## 2019-09-12 DIAGNOSIS — T81718A Complication of other artery following a procedure, not elsewhere classified, initial encounter: Secondary | ICD-10-CM

## 2019-09-12 DIAGNOSIS — I724 Aneurysm of artery of lower extremity: Secondary | ICD-10-CM | POA: Diagnosis not present

## 2019-09-12 NOTE — Telephone Encounter (Signed)
Outreach made to Pt.  Advised Pt he will be tentatively scheduled for his procedure on Monday 09/17/2019.  Advised he would need to get a covid test tomorrow 09/13/19 in anticipation of his procedure.  Pt and wife agreeable to covid test tomorrow at [facility] at 10:00 am.  Advised would call tomorrow with the rest of the details for his procedure.

## 2019-09-12 NOTE — Progress Notes (Signed)
VAS [place] LOWER EXTREMITY ARTERIAL DUPLEX RIGHT     08/3019 Juan Valdez

## 2019-09-13 ENCOUNTER — Other Ambulatory Visit (HOSPITAL_COMMUNITY)
Admission: RE | Admit: 2019-09-13 | Discharge: 2019-09-13 | Disposition: A | Discharge status: Home / Self Care | Payer: Medicare Other | Source: Ambulatory Visit | Attending: Cardiovascular Disease | Admitting: Cardiovascular Disease

## 2019-09-13 ENCOUNTER — Other Ambulatory Visit (HOSPITAL_COMMUNITY): Payer: Self-pay | Admitting: Cardiovascular Disease

## 2019-09-13 DIAGNOSIS — Z20828 Contact with and (suspected) exposure to other viral communicable diseases: Secondary | ICD-10-CM | POA: Diagnosis not present

## 2019-09-13 DIAGNOSIS — Z01812 Encounter for preprocedural laboratory examination: Secondary | ICD-10-CM | POA: Insufficient documentation

## 2019-09-13 DIAGNOSIS — I729 Aneurysm of unspecified site: Secondary | ICD-10-CM

## 2019-09-14 LAB — NOVEL CORONAVIRUS, NAA (HOSP ORDER, SEND-OUT TO REF LAB; TAT 18-24 HRS): SARS-CoV-2, NAA: NOT DETECTED

## 2019-09-17 ENCOUNTER — Other Ambulatory Visit (HOSPITAL_COMMUNITY): Payer: Self-pay | Admitting: Cardiovascular Disease

## 2019-09-17 ENCOUNTER — Ambulatory Visit (HOSPITAL_COMMUNITY)
Admission: RE | Admit: 2019-09-17 | Discharge: 2019-09-17 | Disposition: A | Discharge status: Home / Self Care | Payer: Medicare Other | Source: Ambulatory Visit | Attending: Cardiovascular Disease | Admitting: Cardiovascular Disease

## 2019-09-17 ENCOUNTER — Telehealth: Payer: Self-pay | Admitting: Cardiology

## 2019-09-17 ENCOUNTER — Other Ambulatory Visit: Payer: Self-pay

## 2019-09-17 DIAGNOSIS — E119 Type 2 diabetes mellitus without complications: Secondary | ICD-10-CM | POA: Insufficient documentation

## 2019-09-17 DIAGNOSIS — I729 Aneurysm of unspecified site: Secondary | ICD-10-CM

## 2019-09-17 LAB — GLUCOSE, CAPILLARY: Glucose-Capillary: 152 mg/dL — ABNORMAL HIGH (ref 70–99)

## 2019-09-17 NOTE — Telephone Encounter (Signed)
Call eliquis to [facility]

## 2019-09-17 NOTE — Progress Notes (Signed)
Vascular lab called states pt is scheduled for 1130.

## 2019-09-17 NOTE — Progress Notes (Signed)
Limited right lower extremity arterial duplex completed with Dr. Gwenlyn Found at the bedside. Refer to "CV Proc" under chart review to view preliminary results.  09/17/2019  Kelby Aline., MHA, RVT, RDCS, RDMS

## 2019-09-17 NOTE — Progress Notes (Signed)
Vascular here states no need to do further tx. Dr Gwenlyn Found called and informed. Pt ok to d/c home/ pt ambulated

## 2019-09-18 ENCOUNTER — Other Ambulatory Visit: Payer: Self-pay | Admitting: Cardiology

## 2019-09-20 ENCOUNTER — Telehealth: Payer: Self-pay | Admitting: Cardiology

## 2019-09-20 NOTE — Telephone Encounter (Signed)
Patient is taking Covid Vaccine on Jan 11th

## 2019-09-28 ENCOUNTER — Ambulatory Visit (INDEPENDENT_AMBULATORY_CARE_PROVIDER_SITE_OTHER): Payer: Medicare Other | Admitting: Cardiology

## 2019-09-28 ENCOUNTER — Other Ambulatory Visit: Payer: Self-pay

## 2019-09-28 ENCOUNTER — Encounter: Payer: Self-pay | Admitting: Cardiology

## 2019-09-28 VITALS — BP 128/74 | HR 76 | Ht 69.0 in | Wt 203.0 lb

## 2019-09-28 DIAGNOSIS — Z9861 Coronary angioplasty status: Secondary | ICD-10-CM

## 2019-09-28 DIAGNOSIS — E782 Mixed hyperlipidemia: Secondary | ICD-10-CM | POA: Diagnosis not present

## 2019-09-28 DIAGNOSIS — I2510 Atherosclerotic heart disease of native coronary artery without angina pectoris: Secondary | ICD-10-CM

## 2019-09-28 DIAGNOSIS — Z87891 Personal history of nicotine dependence: Secondary | ICD-10-CM

## 2019-09-28 DIAGNOSIS — R609 Edema, unspecified: Secondary | ICD-10-CM | POA: Diagnosis not present

## 2019-09-28 DIAGNOSIS — E088 Diabetes mellitus due to underlying condition with unspecified complications: Secondary | ICD-10-CM

## 2019-09-28 DIAGNOSIS — I4821 Permanent atrial fibrillation: Secondary | ICD-10-CM

## 2019-09-28 DIAGNOSIS — I10 Essential (primary) hypertension: Secondary | ICD-10-CM

## 2019-09-28 NOTE — Patient Instructions (Signed)
Medication Instructions:  Your physician recommends that you continue on your current medications as directed. Please refer to the Current Medication list given to you today.  *If you need a refill on your cardiac medications before your next appointment, please call your pharmacy*  Lab Work: NOne If you have labs (blood work) drawn today and your tests are completely normal, you will receive your results only by: Marland Kitchen MyChart Message (if you have MyChart) OR . A paper copy in the mail If you have any lab test that is abnormal or we need to change your treatment, we will call you to review the results.  Testing/Procedures: None  Follow-Up: At [facility], you and your health needs are our priority.  As part of our continuing mission to provide you with exceptional heart care, we have created designated Provider Care Teams.  These Care Teams include your primary Cardiologist (physician) and Advanced Practice Providers (APPs -  Physician Assistants and Nurse Practitioners) who all work together to provide you with the care you need, when you need it.  Your next appointment:   6 month(s)  The format for your next appointment:   In Person  Provider:   Berniece Salines, DO  Other Instructions

## 2019-09-28 NOTE — Progress Notes (Signed)
Cardiology Office Note:    Date:  09/28/2019   ID:  Juan Valdez, DOB 1946/03/19, MRN [id number]  PCP:  Default, Provider, MD  Cardiologist:  Jenean Lindau, MD   Referring MD: No ref. provider found    ASSESSMENT:    1. Coronary artery disease involving native coronary artery of native heart without angina pectoris   2. Permanent atrial fibrillation ([place])   3. Essential hypertension   4. CAD S/P percutaneous coronary angioplasty   5. Mixed dyslipidemia   6. Diabetes mellitus due to underlying condition with unspecified complications ([place])   7. Former smoker    PLAN:    In order of problems listed above:  1. Coronary artery disease: Secondary prevention stressed with the patient.  Importance of compliance with diet and medication stressed and he vocalized understanding.  Coronary angiography report was discussed with the patient at length.  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  He has mild right pedal edema only at the ankles and he has been initiated diuretic therapy by his primary care physician who will follow him in 3 weeks. 2. Essential hypertension: Blood pressure is stable 3. Mixed dyslipidemia: Diet was discussed.  Lipids were reviewed. 4. Patient had multiple evaluations at the hospital with extensive reporting and detailed evaluation of this was part time of 40 minutes in total. 5. Patient will be seen in follow-up appointment in 6 months or earlier if the patient has any concerns    Medication Adjustments/Labs and Tests Ordered: Current medicines are reviewed at length with the patient today.  Concerns regarding medicines are outlined above.  No orders of the defined types were placed in this encounter.  No orders of the defined types were placed in this encounter.    No chief complaint on file.    History of Present Illness:    OSAZE HUBBERT is a 74 y.o. male.  Patient has past medical history of coronary artery  disease post stenting, essential hypertension, dyslipidemia and diabetes mellitus.  He denies any problems at this time and takes care of activities of daily.  No chest pain orthopnea or PND.  At the time of my evaluation, the patient is alert awake oriented and in no distress.  Past Medical History:  Diagnosis Date  . Anemia   . Atrial fibrillation ([place]) 09/07/2018  . CAD (coronary artery disease)    a. s/p prior DES to RCA b. cath in 07/2019 showing 75% Prox to mid RCA stenosis, 5% ISR along previously placed distal RCA stent, 85% Prox LCx stenosis, and 100% CTO of distal LCx with filled from L--> L collaterals. Also had a long-diffuse 65-75% stenosis along proximal to mid-LAD. Unable to fully expand balloon for PCI of RCA and lesion reduced to 65%. Future atherectotmy of RCA and LCx.   . Colon polyp   . COPD (chronic obstructive pulmonary disease) ([place])   . Diabetes ([place])   . Diverticulosis   . GERD (gastroesophageal reflux disease)   . Gout   . HLD (hyperlipidemia)   . Lung cancer ([place])   . Perforated bowel [facility])     Past Surgical History:  Procedure Laterality Date  . ABDOMINAL SURGERY    . CORONARY ANGIOPLASTY WITH STENT PLACEMENT  2003  . CORONARY ATHERECTOMY N/A 08/22/2019   Procedure: CORONARY ATHERECTOMY;  Surgeon: Leonie Man, MD;  Location: [place] [facility];  Service: Cardiovascular;  Laterality: N/A;  . CORONARY BALLOON ANGIOPLASTY N/A 07/20/2019  Procedure: CORONARY BALLOON ANGIOPLASTY;  Surgeon: Leonie Man, MD;  Location: [place] [facility];  Service: Cardiovascular;  Laterality: N/A;  RCA  . CORONARY BALLOON ANGIOPLASTY N/A 08/22/2019   Procedure: CORONARY BALLOON ANGIOPLASTY;  Surgeon: Leonie Man, MD;  Location: [place] [facility];  Service: Cardiovascular;  Laterality: N/A;  . CORONARY STENT INTERVENTION  08/22/2019  . CORONARY STENT INTERVENTION N/A 08/22/2019   Procedure: CORONARY STENT INTERVENTION;  Surgeon: Leonie Man, MD;  Location: [place] [facility];  Service: Cardiovascular;  Laterality: N/A;  . LEFT HEART CATH AND CORONARY ANGIOGRAPHY N/A 07/20/2019   Procedure: LEFT HEART CATH AND CORONARY ANGIOGRAPHY;  Surgeon: Leonie Man, MD;  Location: [place] [facility];  Service: Cardiovascular;  Laterality: N/A;  . LEFT HEART CATH AND CORONARY ANGIOGRAPHY N/A 08/22/2019   Procedure: LEFT HEART CATH AND CORONARY ANGIOGRAPHY;  Surgeon: Leonie Man, MD;  Location: [place] [facility];  Service: Cardiovascular;  Laterality: N/A;  . LUNG CANCER SURGERY    . ROTATOR CUFF REPAIR Left   . TEMPORARY PACEMAKER N/A 08/22/2019   Procedure: TEMPORARY PACEMAKER;  Surgeon: Leonie Man, MD;  Location: [place] [facility];  Service: Cardiovascular;  Laterality: N/A;  . TONSILLECTOMY     age 41    Current Medications: Current Meds  Medication Sig  . ALAWAY 0.025 % ophthalmic solution Place 1 drop into both eyes 2 (two) times daily as needed (allergy/irritated eyes.).  Marland Kitchen albuterol (PROVENTIL) (2.5 MG/3ML) 0.083% nebulizer solution Take 3 mLs (2.5 mg total) by nebulization every 6 (six) hours as needed for up to 4 doses for wheezing or shortness of breath.  Marland Kitchen albuterol (VENTOLIN HFA) 108 (90 Base) MCG/ACT inhaler Inhale 2 puffs into the lungs every 6 (six) hours as needed for wheezing or shortness of breath.  . allopurinol (ZYLOPRIM) 300 MG tablet Take 300 mg by mouth daily.  . Ascorbic Acid (VITAMIN C) 1000 MG tablet Take 1,000 mg by mouth daily.  Marland Kitchen atorvastatin (LIPITOR) 40 MG tablet Take 40 mg by mouth every evening.   . Calcium Carb-Cholecalciferol (CALCIUM 600+D3 PO) Take 1 tablet by mouth daily.  . Cholecalciferol (VITAMIN D3) 50 MCG (2000 UT) TABS Take 2,000 Units by mouth daily.  . clopidogrel (PLAVIX) 75 MG tablet Take 1 tablet (75 mg total) by mouth daily.  Marland Kitchen ELIQUIS 5 MG TABS tablet TAKE 1 TABLET BY MOUTH TWICE DAILY  . Fluticasone-Umeclidin-Vilant (TRELEGY ELLIPTA) 100-62.5-25 MCG/INH AEPB Inhale 1 puff into the lungs daily.    . furosemide (LASIX) 20 MG tablet Take 20 mg (1 Tab) every other day  . glipiZIDE (GLUCOTROL XL) 2.5 MG 24 hr tablet Take 2.5 mg by mouth daily with breakfast.  . HOMEOPATHIC PRODUCTS EX Apply 1 application topically 2 (two) times daily as needed (diabetic pain.). Topricin Foot Pain Relief Cream  . HYDROcodone-acetaminophen (HYCET) 7.5-325 mg/15 ml solution Take 1 mL by mouth 2 (two) times daily as needed for pain.  Marland Kitchen ipratropium (ATROVENT) 0.03 % nasal spray Place 2 sprays into both nostrils every 12 (twelve) hours as needed for rhinitis.  . metoprolol succinate (TOPROL-XL) 25 MG 24 hr tablet Take 12.5 mg by mouth daily.   . nitroGLYCERIN (NITROSTAT) 0.4 MG SL tablet DISSOLVE 1 TABLET UNDER THE TONGUE EVERY 5 MINUTES AS NEEDED FOR CHEST PAIN  . Omega-3 Fatty Acids (FISH OIL) 1200 MG CAPS Take 1,200 mg by mouth daily.  Vladimir Faster Glycol-Propyl Glycol (SYSTANE) 0.4-0.3 % SOLN Place 1 drop into both eyes  2 (two) times daily as needed (dry/irritated eyes).  . potassium gluconate 595 (99 K) MG TABS tablet Take 595 mg by mouth every other day.  . tamsulosin (FLOMAX) 0.4 MG CAPS capsule Take 0.4 mg by mouth daily after supper.   . traZODone (DESYREL) 150 MG tablet Take 150 mg by mouth at bedtime.   . triamcinolone cream (KENALOG) 0.1 % Apply 1 application topically daily.     Allergies:   Patient has no known allergies.   Social History   Socioeconomic History  . Marital status: Widowed    Spouse name: Not on file  . Number of children: 0  . Years of education: Not on file  . Highest education level: Not on file  Occupational History  . Occupation: retired  Tobacco Use  . Smoking status: Former Smoker    Packs/day: 3.00    Years: 57.00    Pack years: 171.00    Types: Cigarettes    Start date: 26    Quit date: 06/13/2014    Years since quitting: 5.2  . Smokeless tobacco: Never Used  Substance and Sexual Activity  . Alcohol use: Not Currently  . Drug use: Never  . Sexual activity:  Not on file  Other Topics Concern  . Not on file  Social History Narrative  . Not on file   Social Determinants of Health   Financial Resource Strain:   . Difficulty of Paying Living Expenses: Not on file  Food Insecurity:   . Worried About Charity fundraiser in the Last Year: Not on file  . Ran Out of Food in the Last Year: Not on file  Transportation Needs:   . Lack of Transportation (Medical): Not on file  . Lack of Transportation (Non-Medical): Not on file  Physical Activity:   . Days of Exercise per Week: Not on file  . Minutes of Exercise per Session: Not on file  Stress:   . Feeling of Stress : Not on file  Social Connections:   . Frequency of Communication with Friends and Family: Not on file  . Frequency of Social Gatherings with Friends and Family: Not on file  . Attends Religious Services: Not on file  . Active Member of Clubs or Organizations: Not on file  . Attends Archivist Meetings: Not on file  . Marital Status: Not on file     Family History: The patient's family history includes Cancer in his father. He was adopted.  ROS:   Please see the history of present illness.    All other systems reviewed and are negative.  EKGs/Labs/Other Studies Reviewed:    The following studies were reviewed today: Leonie Man, MD Study date: 08/22/19  MyChart Results Release  MyChart Status: Active Results Release  Physicians  Panel Physicians Referring Physician Case Authorizing Physician  Leonie Man, MD (Primary)    Procedures  CORONARY BALLOON ANGIOPLASTY  CORONARY STENT INTERVENTION  CORONARY ATHERECTOMY  LEFT HEART CATH AND CORONARY ANGIOGRAPHY  TEMPORARY PACEMAKER  Conclusion    LESION #1: Prox RCA to Mid RCA lesion is 65-70% stenosed.  Orbital atherectomy was performed on the entire segment.  A drug-eluting stent was successfully placed covering the entire lesion, using a STENT RESOLUTE ONYX 4.0X38 -> this was postdilated with  a 4.5 mm balloon to roughly 4.6 mm.  Post intervention, there is a 0% residual stenosis.  Previously placed Dist RCA stent is 5% stenosed.  ----------------------------------------  Prox Cx lesion is 85% stenosed. Prox  Cx to Mid Cx lesion is 70% stenosed (this lesion actually became identifiable with angioplasty of the ostial lesion)  Scoring balloon angioplasty was performed using a BALLOON WOLVERINE 2.50X10 -followed by a 2.5 mm x 19mm Alliance balloon  Stent was not delivered due to inability to fully expand the balloon in the 70% lesion.  Post intervention, there is a 45% residual stenosis in the original identified 85% lesion.  Post intervention, there is a 50% residual stenosis in the newly identified 70% more calcified lesion..  -Downstream: Dist Cx lesion is 100% stenosed. 3rd Mrg lesion is 100% stenosed.  ----------------------------------------  LV end diastolic pressure is normal. There is no aortic valve stenosis.   SUMMARY  Successful orbital atherectomy based PCI of the proximal to mid RCA-> Resolute Onyx DES 4.0 mm x 38 mm postdilated to 4.5 mm)  Scoring balloon angioplasty only of proximal LCx (unable to advance wire adequately to perform atherectomy) -> reduced from 85% to 50%.  Normal LVEDP  Successful temporary pacemaker placement   RECOMMENDATIONS  Overnight monitoring for post PCI care and sheath removal.  We will restart Eliquis tonight  He is on statin and beta-blocker.  Will adjust medicines accordingly.  Provided he has improvement of symptoms, would just continue with PTCA only of the Circumflex as there was a suboptimal but fairly good result reducing 85% down to 50% stenosis.   He will follow up with Dr. Rose Phi, MD       Recent Labs: 03/08/2019: TSH 1.080 05/11/2019: Magnesium 1.9 07/08/2019: B Natriuretic Peptide 87.7 08/30/2019: ALT 13; BUN 19; Creatinine, Ser 1.16; Hemoglobin 14.6; Platelets 134; Potassium 4.4;  Sodium 140  Recent Lipid Panel    Component Value Date/Time   CHOL 128 08/30/2019 0852   TRIG 145 08/30/2019 0852   HDL 37 (L) 08/30/2019 0852   CHOLHDL 3.5 08/30/2019 0852   LDLCALC 66 08/30/2019 0852    Physical Exam:    VS:  BP 128/74   Pulse 76   Ht 5\' 9"  (1.753 m)   Wt 203 lb (92.1 kg)   SpO2 97%   BMI 29.98 kg/m     Wt Readings from Last 3 Encounters:  09/28/19 203 lb (92.1 kg)  09/17/19 190 lb (86.2 kg)  09/05/19 190 lb (86.2 kg)     GEN: Patient is in no acute distress HEENT: Normal NECK: No JVD; No carotid bruits LYMPHATICS: No lymphadenopathy CARDIAC: Hear sounds regular, 2/6 systolic murmur at the apex. RESPIRATORY:  Clear to auscultation without rales, wheezing or rhonchi  ABDOMEN: Soft, non-tender, non-distended MUSCULOSKELETAL:  No edema; No deformity  SKIN: Warm and dry NEUROLOGIC:  Alert and oriented x 3 PSYCHIATRIC:  Normal affect   Signed, Jenean Lindau, MD  09/28/2019 1:58 PM    [facility]

## 2019-10-08 ENCOUNTER — Telehealth: Payer: Self-pay | Admitting: Cardiology

## 2019-10-08 ENCOUNTER — Ambulatory Visit: Payer: Medicare Other | Admitting: Cardiology

## 2019-10-08 NOTE — Telephone Encounter (Signed)
Patient calling the office for samples of medication:   1.  What medication and dosage are you requesting samples for? ELIQUIS 5 MG TABS tablet  2.  Are you currently out of this medication? No  Patient was advised to callback today to see if any samples have came in.

## 2019-10-08 NOTE — Telephone Encounter (Signed)
No eliquis samples in stock right now. Patient informed that RN will check again next week and notify patient. No further questions at this time.

## 2019-10-16 ENCOUNTER — Other Ambulatory Visit: Payer: Self-pay

## 2019-10-16 MED ORDER — TRELEGY ELLIPTA 100-62.5-25 MCG/INH IN AEPB: 1.0000 | INHALATION_SPRAY | Freq: Every day | RESPIRATORY_TRACT | 3 refills | Status: DC

## 2019-10-17 ENCOUNTER — Telehealth: Payer: Self-pay | Admitting: Emergency Medicine

## 2019-10-17 MED ORDER — TRELEGY ELLIPTA 100-62.5-25 MCG/INH IN AEPB: 1.0000 | INHALATION_SPRAY | Freq: Every day | RESPIRATORY_TRACT | 0 refills | Status: DC

## 2019-10-17 NOTE — Telephone Encounter (Signed)
Spoke with the pt and verified his pharmacy  Rx was sent  Nothing further needed

## 2019-10-19 DIAGNOSIS — R609 Edema, unspecified: Secondary | ICD-10-CM | POA: Diagnosis not present

## 2019-10-24 ENCOUNTER — Ambulatory Visit: Payer: Medicare Other | Admitting: Cardiology

## 2019-11-02 ENCOUNTER — Ambulatory Visit: Payer: Medicare Other | Admitting: Emergency Medicine

## 2019-11-06 DIAGNOSIS — Z955 Presence of coronary angioplasty implant and graft: Secondary | ICD-10-CM | POA: Diagnosis not present

## 2019-11-07 DIAGNOSIS — Z955 Presence of coronary angioplasty implant and graft: Secondary | ICD-10-CM | POA: Diagnosis not present

## 2019-11-12 ENCOUNTER — Telehealth: Payer: Self-pay | Admitting: Cardiology

## 2019-11-12 NOTE — Telephone Encounter (Signed)
New Message  Pt c/o Shortness Of Breath: STAT if SOB developed within the last 24 hours or pt is noticeably SOB on the phone  1. Are you currently SOB (can you hear that pt is SOB on the phone)? No  2. How long have you been experiencing SOB? Since Thursday night  3. Are you SOB when sitting or when up moving around? Both  4. Are you currently experiencing any other symptoms? No, just SOB

## 2019-11-12 NOTE — Telephone Encounter (Signed)
Contacted patient to discuss SOB- patient did not answer.  Left voicemail to call back, with call back number.

## 2019-11-12 NOTE — Telephone Encounter (Signed)
Follow up  ° ° °Pt is returning call  ° ° °Please call back  °

## 2019-11-12 NOTE — Telephone Encounter (Signed)
Called pt and scheduled him for 11/20/2019 at 8:45. Pt verbalizes understanding that he need to go to the ED if he becomes worse.

## 2019-11-12 NOTE — Telephone Encounter (Signed)
I agree with the nurse's recommendations.  Let him see his pulmonologist.  If things gets worse he knows to go to the nearest emergency room.  Give him an appointment to see me in the next week or 2.

## 2019-11-12 NOTE — Telephone Encounter (Signed)
Patient returned call back- he states that last week he has noticed he is very short of breath. He feels exhausted with no energy, and becomes more short of breath if he lays down so he is not sleeping well.  He is slightly short of breath on the phone during our conversation. Patient denies chest pain, swelling, arm pain, and he states his blood pressure is fine- does not give numbers. He states he is suppose to do an hour of cardiac rehab and he is currently not able to do that, he would like to know what he should do regarding cardiac rehab. I asked if he had reached out to his pulmonologist and he states no- I did advise him to contact them as well to make them aware, but I would route a message to MD to give any advise on how to proceed and what to do regarding cardiac rehab. Patient verbalized understanding.

## 2019-11-13 ENCOUNTER — Telehealth: Payer: Self-pay | Admitting: Emergency Medicine

## 2019-11-13 NOTE — Progress Notes (Signed)
@Patient  ID: Juan Valdez, male    DOB: 07-16-1946, 74 y.o.   MRN: [id number]  Chief Complaint  Patient presents with  . Follow-up    Has noticed increased SOB since November 2020. Notices the increase more at night.     Referring provider: No ref. provider found  HPI:  74 year old male former smoker followed in our office for suspected COPD  PMH: CAD, A. fib, hypertension, dyslipidemia, type 2 diabetes, status post 2 stents placed in October/2020 Smoker/ Smoking History: Former smoker.  171-pack-year smoking history.  Quit 2015 Maintenance:  Trelegy Ellipta  Pt of: Dr. Lamonte Sakai   11/14/2019  - Visit   73 year old male former smoker followed in our office for suspected COPD.  Has had increased shortness of breath since November/2020.  Patient initially had improved dyspnea after having 2 stents placed by cardiology in October/2020.  Unfortunately patient never followed up with our office to be rescheduled for pulmonary function testing.  His pulmonary function test that was ordered last year is now currently scheduled to be completed next month.  There is a delay due to Covid precautions as well as backlog of pulmonary function testing.  Patient reports that he had progressive worsening shortness of breath.  Potentially also orthopnea and issues breathing while lying flat.  He does not feel he is retaining fluid.  He reports adherence to his Lasix.  He has had follow-up with cardiology and they have requested follow back up with pulmonary.  Patient also gives regular follow-up with his primary care provider Dr. Helene Kelp in [place], [place].   Questionaires / Pulmonary Flowsheets:   MMRC: mMRC Dyspnea Scale mMRC Score  11/14/2019 2    Tests:   09/13/2019-SARS-CoV-2-not detected  07/07/2019-chest x-ray-cardiomegaly without acute abnormality in the lungs, elevation of left hemidiaphragm  07/08/2019-CTA chest-no PE,, ascending thoracic aorta measures 4 x 4 cm recommending  annual imaging followed by CTA chest, enlargement of main pulmonary outflow tract, mildly enlarged precarinal and right hilar lymph nodes of uncertain etiology, evidence of lymph node calcification at several sites consistent with prior granulomatous disease, reflux of contrast into inferior vena cava and hepatic veins a finding that may be indicative of a degree of increased right heart pressure  10/18/2018  Echo: EF of 45-50%, Normal RV systolic function, Severe dilation of LA,. Mildly dilated RA, MV regurge and moderate thickening, Mild thickening of a tricuspic  Aortic valve with mild thickening.  October / 2020 Heart Cath    Prox RCA to Mid RCA lesion is 75% stenosed.  Scoring balloon angioplasty was performed using a BALLOON Payne EMERGE MR 3.0X8.  Post intervention, there is a 65% residual stenosis. Unable to fully expand balloon / deliver stent.  Prox Cx lesion is 85% stenosed- calcified.  Dist Cx - 3rd Mrg lesion is 100% stenosed.  Dist RCA Stent is 5% stenosed.  The left ventricular systolic function is normal.  The left ventricular ejection fraction is 55-65% by visual estimate.  There is no mitral valve regurgitation.  There is no aortic valve stenosis.   SUMMARY  Severe 2 Vessel CAD - focal 85% calcified pLCx & 100% CTO of dCx-OM4 (fills L-L collaterals), long-diffuse 65-75% p-m LAD segmental lesion (moderately calcified) with patent dRCA stent. ? PTCA only of p-mRCA - unable to deliver stent (will need Atherctomy)  Normal LVEF & LVEDP   RECOMMENDATION  Will observe overnight post PTCA -- will need Atherectomy based PCI of RCA & LCx - Scheduled for 12/9 1st Case (will  need updated H&P upon arrival).  Loaded with Brilinta in Cath Lab - but with Eliquis, will convert to Plavix in AM - 1 more dose Brilinta tonight, 300 mg Plavix in AM then 75 mg BID  Aggressive RF management     FENO:  No results found for: NITRICOXIDE  PFT: No flowsheet data found.  WALK:   SIX MIN WALK 11/14/2019  Supplimental Oxygen during Test? (L/min) No  Tech Comments: Patient was able to complete 2 laps at a steady pace. Denied any SOB, chest pain or leg pain during walk. No O2 needed during or after walk.    Imaging: DG Chest 2 View  Result Date: 11/14/2019 CLINICAL DATA:  Shortness of breath.  COPD. EXAM: CHEST - 2 VIEW COMPARISON:  CT chest 07/08/2019 and chest radiograph 07/07/2019. FINDINGS: Trachea is midline. Heart is at the upper limits of normal in size to mildly enlarged. Minimal volume loss at the base of the left hemithorax, adjacent to a chronically elevated left hemidiaphragm. Lungs are otherwise clear. No pleural fluid. IMPRESSION: 1. No acute findings. 2. Chronic left hemidiaphragm elevation. Electronically Signed   By: Lorin Picket M.D.   On: 11/14/2019 11:40    Lab Results:  CBC    Component Value Date/Time   WBC 7.0 08/30/2019 0857   WBC 7.1 08/23/2019 0424   RBC 4.79 08/30/2019 0857   RBC 4.92 08/23/2019 0424   HGB 14.6 08/30/2019 0857   HCT 42.5 08/30/2019 0857   PLT 134 (L) 08/30/2019 0857   MCV 89 08/30/2019 0857   MCH 30.5 08/30/2019 0857   MCH 30.3 08/23/2019 0424   MCHC 34.4 08/30/2019 0857   MCHC 32.5 08/23/2019 0424   RDW 14.4 08/30/2019 0857   LYMPHSABS 0.9 05/09/2019 0809   MONOABS 0.9 05/09/2019 0809   EOSABS 0.1 05/09/2019 0809   BASOSABS 0.0 05/09/2019 0809    BMET    Component Value Date/Time   NA 140 08/30/2019 0852   K 4.4 08/30/2019 0852   CL 100 08/30/2019 0852   CO2 28 08/30/2019 0852   GLUCOSE 144 (H) 08/30/2019 0852   GLUCOSE 141 (H) 08/23/2019 0424   BUN 19 08/30/2019 0852   CREATININE 1.16 08/30/2019 0852   CALCIUM 9.9 08/30/2019 0852   GFRNONAA 61 08/30/2019 0852   GFRAA 71 08/30/2019 0852    BNP    Component Value Date/Time   BNP 87.7 07/08/2019 0543    ProBNP No results found for: PROBNP  Specialty Problems      Pulmonary Problems   COPD (chronic obstructive pulmonary disease) (HCC)    Non-small cell cancer of right lung (HCC)   Orthopnea   Allergic rhinitis   COPD with acute exacerbation (HCC)   Shortness of breath   DOE (dyspnea on exertion)      No Known Allergies  Immunization History  Administered Date(s) Administered  . Influenza, High Dose Seasonal PF 05/15/2019  . Influenza-Unspecified 07/14/2018  . PFIZER SARS-COV-2 Vaccination 09/24/2019, 10/15/2019    Past Medical History:  Diagnosis Date  . Anemia   . Atrial fibrillation ([place]) 09/07/2018  . CAD (coronary artery disease)    a. s/p prior DES to RCA b. cath in 07/2019 showing 75% Prox to mid RCA stenosis, 5% ISR along previously placed distal RCA stent, 85% Prox LCx stenosis, and 100% CTO of distal LCx with filled from L--> L collaterals. Also had a long-diffuse 65-75% stenosis along proximal to mid-LAD. Unable to fully expand balloon for PCI of RCA and lesion reduced  to 65%. Future atherectotmy of RCA and LCx.   . Colon polyp   . COPD (chronic obstructive pulmonary disease) ([place])   . Diabetes ([place])   . Diverticulosis   . GERD (gastroesophageal reflux disease)   . Gout   . HLD (hyperlipidemia)   . Lung cancer ([place])   . Perforated bowel ([place])     Tobacco History: Social History   Tobacco Use  Smoking Status Former Smoker  . Packs/day: 3.00  . Years: 57.00  . Pack years: 171.00  . Types: Cigarettes  . Start date: 56  . Quit date: 06/13/2014  . Years since quitting: 5.4  Smokeless Tobacco Never Used   Counseling given: Yes   Continue to not smoke  Outpatient Encounter Medications as of 11/14/2019  Medication Sig  . ALAWAY 0.025 % ophthalmic solution Place 1 drop into both eyes 2 (two) times daily as needed (allergy/irritated eyes.).  Marland Kitchen albuterol (PROVENTIL) (2.5 MG/3ML) 0.083% nebulizer solution Take 3 mLs (2.5 mg total) by nebulization every 6 (six) hours as needed for up to 4 doses for wheezing or shortness of breath.  Marland Kitchen albuterol (VENTOLIN HFA) 108 (90 Base) MCG/ACT inhaler Inhale 2  puffs into the lungs every 6 (six) hours as needed for wheezing or shortness of breath.  . allopurinol (ZYLOPRIM) 300 MG tablet Take 300 mg by mouth daily.  . Ascorbic Acid (VITAMIN C) 1000 MG tablet Take 1,000 mg by mouth daily.  Marland Kitchen atorvastatin (LIPITOR) 40 MG tablet Take 40 mg by mouth every evening.   . Calcium Carb-Cholecalciferol (CALCIUM 600+D3 PO) Take 1 tablet by mouth daily.  . Cholecalciferol (VITAMIN D3) 50 MCG (2000 UT) TABS Take 2,000 Units by mouth daily.  . clopidogrel (PLAVIX) 75 MG tablet Take 1 tablet (75 mg total) by mouth daily.  Marland Kitchen ELIQUIS 5 MG TABS tablet TAKE 1 TABLET BY MOUTH TWICE DAILY  . Fluticasone-Umeclidin-Vilant (TRELEGY ELLIPTA) 100-62.5-25 MCG/INH AEPB Inhale 1 puff into the lungs daily.  . furosemide (LASIX) 20 MG tablet Take 20 mg (1 Tab) every other day  . glipiZIDE (GLUCOTROL XL) 2.5 MG 24 hr tablet Take 2.5 mg by mouth daily with breakfast.  . HOMEOPATHIC PRODUCTS EX Apply 1 application topically 2 (two) times daily as needed (diabetic pain.). Topricin Foot Pain Relief Cream  . HYDROcodone-acetaminophen (HYCET) 7.5-325 mg/15 ml solution Take 1 mL by mouth 2 (two) times daily as needed for pain.  Marland Kitchen ipratropium (ATROVENT) 0.03 % nasal spray Place 2 sprays into both nostrils every 12 (twelve) hours as needed for rhinitis.  . metoprolol succinate (TOPROL-XL) 25 MG 24 hr tablet Take 12.5 mg by mouth daily.   . nitroGLYCERIN (NITROSTAT) 0.4 MG SL tablet DISSOLVE 1 TABLET UNDER THE TONGUE EVERY 5 MINUTES AS NEEDED FOR CHEST PAIN  . Omega-3 Fatty Acids (FISH OIL) 1200 MG CAPS Take 1,200 mg by mouth daily.  Vladimir Faster Glycol-Propyl Glycol (SYSTANE) 0.4-0.3 % SOLN Place 1 drop into both eyes 2 (two) times daily as needed (dry/irritated eyes).  . potassium gluconate 595 (99 K) MG TABS tablet Take 595 mg by mouth every other day.  . tamsulosin (FLOMAX) 0.4 MG CAPS capsule Take 0.4 mg by mouth daily after supper.   . traZODone (DESYREL) 150 MG tablet Take 150 mg by mouth  at bedtime.   . triamcinolone cream (KENALOG) 0.1 % Apply 1 application topically daily.  . Fluticasone-Umeclidin-Vilant (TRELEGY ELLIPTA) 100-62.5-25 MCG/INH AEPB Inhale 1 puff into the lungs daily.   No facility-administered encounter medications on file as of  11/14/2019.     Review of Systems  Review of Systems  Constitutional: Positive for fatigue. Negative for activity change, chills, fever and unexpected weight change.  HENT: Positive for postnasal drip and rhinorrhea. Negative for sinus pressure, sinus pain and sore throat.   Eyes: Negative.   Respiratory: Positive for shortness of breath. Negative for cough and wheezing.        ? orthopnea  Cardiovascular: Positive for leg swelling. Negative for chest pain and palpitations.  Gastrointestinal: Negative for constipation, diarrhea, nausea and vomiting.  Endocrine: Negative.   Genitourinary: Negative.   Musculoskeletal: Negative.   Skin: Negative.   Neurological: Negative for dizziness and headaches.  Psychiatric/Behavioral: Negative.  Negative for dysphoric mood. The patient is not nervous/anxious.   All other systems reviewed and are negative.    Physical Exam  BP 126/82 (BP Location: Left Arm, Patient Position: Sitting, Cuff Size: Normal)   Pulse 72   Temp 97.7 F (36.5 C) (Temporal)   Ht 5\' 9"  (1.753 m)   Wt 194 lb (88 kg)   SpO2 97% Comment: on RA  BMI 28.65 kg/m   Wt Readings from Last 5 Encounters:  11/14/19 194 lb (88 kg)  09/28/19 203 lb (92.1 kg)  09/17/19 190 lb (86.2 kg)  09/05/19 190 lb (86.2 kg)  08/30/19 200 lb 12.8 oz (91.1 kg)    BMI Readings from Last 5 Encounters:  11/14/19 28.65 kg/m  09/28/19 29.98 kg/m  09/17/19 28.06 kg/m  09/05/19 28.06 kg/m  08/30/19 29.65 kg/m     Physical Exam Vitals and nursing note reviewed.  Constitutional:      General: He is not in acute distress.    Appearance: Normal appearance. He is obese.  HENT:     Head: Normocephalic and atraumatic.     Right  Ear: Hearing and external ear normal.     Left Ear: Hearing and external ear normal.     Nose: Congestion and rhinorrhea present. No mucosal edema.     Right Turbinates: Not enlarged.     Left Turbinates: Not enlarged.     Mouth/Throat:     Mouth: Mucous membranes are dry.     Pharynx: Oropharynx is clear. No oropharyngeal exudate.     Comments: Post nasal drip  Eyes:     Pupils: Pupils are equal, round, and reactive to light.  Cardiovascular:     Rate and Rhythm: Normal rate and regular rhythm.     Pulses: Normal pulses.     Heart sounds: Normal heart sounds. No murmur.  Pulmonary:     Effort: Pulmonary effort is normal.     Breath sounds: Examination of the left-lower field reveals decreased breath sounds. Decreased breath sounds present. No wheezing or rales.  Musculoskeletal:     Cervical back: Normal range of motion.     Right lower leg: Edema (1-2+) present.     Left lower leg: Edema (1-2+) present.  Lymphadenopathy:     Cervical: No cervical adenopathy.  Skin:    General: Skin is warm and dry.     Capillary Refill: Capillary refill takes less than 2 seconds.     Findings: No erythema or rash.  Neurological:     General: No focal deficit present.     Mental Status: He is alert and oriented to person, place, and time.     Motor: No weakness.     Coordination: Coordination normal.     Gait: Gait is intact. Gait normal.  Psychiatric:  Mood and Affect: Mood normal.        Behavior: Behavior normal. Behavior is cooperative.        Thought Content: Thought content normal.        Judgment: Judgment normal.       Assessment & Plan:   Former smoker Assessment: Former smoker Quit 2015 171-pack-year smoking history 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  Plan: Complete pulmonary function testing as scheduled Chest x-ray today Based off the chest x-ray results will consider repeat CT imaging Continue to not  smoke  COPD (chronic obstructive pulmonary disease) ([place]) Plan: Chest x-ray today Lab work today Continue Trelegy Ellipta Complete pulmonary function testing as scheduled   Medication management Plan: [facility] to help patient with out-of-pocket co-pay cost  Shortness of breath Progressive shortness of breath Patient is certainly at risk of pulmonary hypertension, likely WHO class II or III Echocardiogram in February/2020 did not show RV dysfunction, CTA chest and October/2020 does show imaging findings consistent with RV dysfunction Lower extremities retaining fluid today Patient reports adherence to diuretics  Plan: Lab work today Chest x-ray today Complete pulmonary function testing as scheduled May need to consider echocardiogram based off of lab work    Return in about 2 months (around 01/14/2020), or if symptoms worsen or fail to improve, for Follow up with Dr. Lamonte Sakai, Follow up for PFT.   Lauraine Rinne, NP 11/14/2019   This appointment required 42 minutes of patient care (this includes precharting, chart review, review of results, face-to-face care, etc.).

## 2019-11-13 NOTE — Telephone Encounter (Signed)
Called and spoke with Patient. Patient is seen by Dr. Lamonte Sakai for emphysema.  Patient stated cardiology had requested Patient see pulmonary for sob.  Patient stated he had be ordered cardio rehab TID, but Patient was unable to complete due to sob.  Patient stated he was ordered cardio rehab, because he had stents placed 08/2019.  Patient stated he checks his sats when he is having sob, sats are staying around 97%.  Patient does use a fan to help when feeling sob, and that helps. Patient is scheduled with Dr. Basilio Cairo 11/20/19, for cardiology follow up. Patient requested pulmonary follow up for sob, before cardiology appointment.  Patient scheduled 11/14/19 at 1030, with Brian,NP.  Nothing further at this time.

## 2019-11-14 ENCOUNTER — Encounter: Payer: Self-pay | Admitting: Pulmonary Disease

## 2019-11-14 ENCOUNTER — Other Ambulatory Visit: Payer: Self-pay

## 2019-11-14 ENCOUNTER — Ambulatory Visit (INDEPENDENT_AMBULATORY_CARE_PROVIDER_SITE_OTHER): Payer: Medicare Other

## 2019-11-14 ENCOUNTER — Ambulatory Visit (INDEPENDENT_AMBULATORY_CARE_PROVIDER_SITE_OTHER): Payer: Medicare Other | Admitting: Pulmonary Disease

## 2019-11-14 VITALS — BP 126/82 | HR 72 | Temp 97.7°F | Ht 69.0 in | Wt 194.0 lb

## 2019-11-14 DIAGNOSIS — Z87891 Personal history of nicotine dependence: Secondary | ICD-10-CM | POA: Diagnosis not present

## 2019-11-14 DIAGNOSIS — Z79899 Other long term (current) drug therapy: Secondary | ICD-10-CM

## 2019-11-14 DIAGNOSIS — J431 Panlobular emphysema: Secondary | ICD-10-CM

## 2019-11-14 DIAGNOSIS — R0602 Shortness of breath: Secondary | ICD-10-CM

## 2019-11-14 LAB — CBC WITH DIFFERENTIAL/PLATELET
Basophils Absolute: 0 10*3/uL (ref 0.0–0.1)
Basophils Relative: 0.5 % (ref 0.0–3.0)
Eosinophils Absolute: 0.1 10*3/uL (ref 0.0–0.7)
Eosinophils Relative: 1.6 % (ref 0.0–5.0)
HCT: 48 % (ref 39.0–52.0)
Hemoglobin: 16.4 g/dL (ref 13.0–17.0)
Lymphocytes Relative: 9.3 % — ABNORMAL LOW (ref 12.0–46.0)
Lymphs Abs: 0.7 10*3/uL (ref 0.7–4.0)
MCHC: 34.2 g/dL (ref 30.0–36.0)
MCV: 89.7 fl (ref 78.0–100.0)
Monocytes Absolute: 0.6 10*3/uL (ref 0.1–1.0)
Monocytes Relative: 7.8 % (ref 3.0–12.0)
Neutro Abs: 5.9 10*3/uL (ref 1.4–7.7)
Neutrophils Relative %: 80.8 % — ABNORMAL HIGH (ref 43.0–77.0)
Platelets: 119 10*3/uL — ABNORMAL LOW (ref 150.0–400.0)
RBC: 5.35 Mil/uL (ref 4.22–5.81)
RDW: 15 % (ref 11.5–15.5)
WBC: 7.3 10*3/uL (ref 4.0–10.5)

## 2019-11-14 LAB — COMPREHENSIVE METABOLIC PANEL
ALT: 16 U/L (ref 0–53)
AST: 16 U/L (ref 0–37)
Albumin: 4.2 g/dL (ref 3.5–5.2)
Alkaline Phosphatase: 123 U/L — ABNORMAL HIGH (ref 39–117)
BUN: 23 mg/dL (ref 6–23)
CO2: 33 mEq/L — ABNORMAL HIGH (ref 19–32)
Calcium: 10.3 mg/dL (ref 8.4–10.5)
Chloride: 98 mEq/L (ref 96–112)
Creatinine, Ser: 1.46 mg/dL (ref 0.40–1.50)
GFR: 47.02 mL/min — ABNORMAL LOW (ref >60.00)
Glucose, Bld: 156 mg/dL — ABNORMAL HIGH (ref 70–99)
Potassium: 4.5 mEq/L (ref 3.5–5.1)
Sodium: 137 mEq/L (ref 135–145)
Total Bilirubin: 0.9 mg/dL (ref 0.2–1.2)
Total Protein: 7.1 g/dL (ref 6.0–8.3)

## 2019-11-14 MED ORDER — TRELEGY ELLIPTA 100-62.5-25 MCG/INH IN AEPB: 1.0000 | INHALATION_SPRAY | Freq: Every day | RESPIRATORY_TRACT | 0 refills | Status: DC

## 2019-11-14 NOTE — Assessment & Plan Note (Signed)
Assessment: Former smoker Quit 2015 171-pack-year smoking history 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  Plan: Complete pulmonary function testing as scheduled Chest x-ray today Based off the chest x-ray results will consider repeat CT imaging Continue to not smoke

## 2019-11-14 NOTE — Assessment & Plan Note (Signed)
Progressive shortness of breath Patient is certainly at risk of pulmonary hypertension, likely WHO class II or III Echocardiogram in February/2020 did not show RV dysfunction, CTA chest and October/2020 does show imaging findings consistent with RV dysfunction Lower extremities retaining fluid today Patient reports adherence to diuretics  Plan: Lab work today Chest x-ray today Complete pulmonary function testing as scheduled May need to consider echocardiogram based off of lab work

## 2019-11-14 NOTE — Assessment & Plan Note (Signed)
Plan: Chest x-ray today Lab work today Continue Trelegy Ellipta Complete pulmonary function testing as scheduled

## 2019-11-14 NOTE — Patient Instructions (Addendum)
You were seen today by Lauraine Rinne, NP  for:   I am concerned regarding her shortness of breath.  We will do lab work and a chest x-ray today.  We also need to complete pulmonary function testing as scheduled.  Do not cancel this appointment.  We checked today to see if we could move it up unfortunately were unable to.  Keep follow-up with cardiology.  We will keep you on Trelegy Ellipta.  I will refer you to a pharmacy team that will contact you over the phone to see if they can help you with your medication cost and decreasing your out-of-pocket expenses.  Please discuss with cardiology if you need to be following a specific low-sodium diet as well as restricting sodium.  Discussed weighing daily with cardiology.  You do have lower extremity swelling today on exam.  I would recommend elevating your legs as well as wearing compression stockings occasionally to help with fluid retention.  It was good seeing you,  [name]   1. Shortness of breath  - DG Chest 2 View; Future - Pro b natriuretic peptide (BNP); Future - Comp Met (CMET); Future - CBC with Differential/Platelet; Future - CBC with Differential/Platelet - Comp Met (CMET) - Pro b natriuretic peptide (BNP)  Walk today in office  Chest x-ray today  Lab work today  Keep follow-up with cardiology  Complete pulmonary function testing as scheduled   2. Panlobular emphysema (HCC)  - AMB Referral to [place] [facility]  Trelegy Ellipta  >>> 1 puff daily in the morning >>>rinse mouth out after use  >>> This inhaler contains 3 medications that help manage her respiratory status, contact our office if you cannot afford this medication or unable to remain on this medication  Note your daily symptoms > remember "red flags" for COPD:   >>>Increase in cough >>>increase in sputum production >>>increase in shortness of breath or activity  intolerance.   If you notice these symptoms, please call the office to be seen.    3.  Medication management  - AMB Referral to [facility]  4. Former smoker  Continue to not smoke   We recommend today:  Orders Placed This Encounter  Procedures  . DG Chest 2 View    Standing Status:   Future    Standing Expiration Date:   01/13/2021    Order Specific Question:   Reason for Exam (SYMPTOM  OR DIAGNOSIS REQUIRED)    Answer:   shortness of breath    Order Specific Question:   Preferred imaging location?    Answer:   Internal    Order Specific Question:   Radiology Contrast Protocol - do NOT remove file path    Answer:   [email or web address]  . Pro b natriuretic peptide (BNP)    Standing Status:   Future    Number of Occurrences:   1    Standing Expiration Date:   11/13/2020  . Comp Met (CMET)    Standing Status:   Future    Number of Occurrences:   1    Standing Expiration Date:   11/13/2020  . CBC with Differential/Platelet    Standing Status:   Future    Number of Occurrences:   1    Standing Expiration Date:   11/13/2020  . AMB Referral to [facility]    Referral Priority:   Urgent    Referral Type:   Consultation    Referral Reason:   [facility]  Number of Visits Requested:   1   Orders Placed This Encounter  Procedures  . DG Chest 2 View  . Pro b natriuretic peptide (BNP)  . Comp Met (CMET)  . CBC with Differential/Platelet  . AMB Referral to [facility]   No orders of the defined types were placed in this encounter.   Follow Up:    Return in about 2 months (around 01/14/2020), or if symptoms worsen or fail to improve, for Follow up with Dr. Lamonte Sakai, Follow up for PFT.   Please do your part to reduce the spread of COVID-19:      Reduce your risk of any infection  and COVID19 by using the similar precautions used for avoiding the common cold or flu:  Marland Kitchen Wash your hands often with soap and warm water for at least 20 seconds.  If soap and water are not readily available, use an  alcohol-based hand sanitizer with at least 60% alcohol.  . If coughing or sneezing, cover your mouth and nose by coughing or sneezing into the elbow areas of your shirt or coat, into a tissue or into your sleeve (not your hands). Langley Gauss A MASK when in public  . Avoid shaking hands with others and consider head nods or verbal greetings only. . Avoid touching your eyes, nose, or mouth with unwashed hands.  . Avoid close contact with people who are sick. . Avoid places or events with large numbers of people in one location, like concerts or sporting events. . If you have some symptoms but not all symptoms, continue to monitor at home and seek medical attention if your symptoms worsen. . If you are having a medical emergency, call 911.   [place] / e-Visit: [email or web address]         [facility]: [facility]: [phone]                   [facility]: [phone]     It is flu season:   >>> Best ways to protect herself from the flu: Receive the yearly flu vaccine, practice good hand hygiene washing with soap and also using hand sanitizer when available, eat a nutritious meals, get adequate rest, hydrate appropriately   Please contact the office if your symptoms worsen or you have concerns that you are not improving.   Thank you for choosing [facility] for your healthcare, and for allowing [place] to partner with you on your healthcare journey. I am thankful to be able to provide care to you today.   Wyn Quaker FNP-C

## 2019-11-14 NOTE — Assessment & Plan Note (Signed)
Plan: [facility] today to help patient with out-of-pocket co-pay cost

## 2019-11-15 ENCOUNTER — Other Ambulatory Visit: Payer: Self-pay

## 2019-11-15 ENCOUNTER — Telehealth: Payer: Self-pay | Admitting: Pulmonary Disease

## 2019-11-15 ENCOUNTER — Telehealth: Payer: Self-pay | Admitting: Emergency Medicine

## 2019-11-15 DIAGNOSIS — Z79899 Other long term (current) drug therapy: Secondary | ICD-10-CM

## 2019-11-15 LAB — PRO B NATRIURETIC PEPTIDE: NT-Pro BNP: 616 pg/mL — ABNORMAL HIGH (ref 0–486)

## 2019-11-15 MED ORDER — TORSEMIDE 10 MG PO TABS: 10.0000 mg | ORAL_TABLET | Freq: Every day | ORAL | 3 refills | Status: DC

## 2019-11-15 NOTE — Telephone Encounter (Signed)
Juan Valdez, Juan Valdez   11/15/19 10:57 AM Note   Patient was seen yesterday and had requested to have his PFT sooner. At the time of his visit yesterday, there were no openings. There is an opening for 3/16 at 9am. I have blocked this spot for him. I left a detailed message for him to call us back so I can get him scheduled for the PFT as well as the COVID test.   Will leave this encounter open for follow up.          Spoke with [name] and made the following appts.   11/24/2019 Covid test for PFT 11/27/2019 PFT at 9:00am 11/27/2019 OV with [name] after PFT   Nothing further is needed.

## 2019-11-15 NOTE — Telephone Encounter (Signed)
It appears he called back and was scheduled for the PFT on 3/16. Will close this encounter.

## 2019-11-15 NOTE — Addendum Note (Signed)
Addended by: Truddie Hidden on: 11/15/2019 11:53 AM   Modules accepted: Orders

## 2019-11-15 NOTE — Telephone Encounter (Signed)
Patient was seen yesterday and had requested to have his PFT sooner. At the time of his visit yesterday, there were no openings. There is an opening for 3/16 at 9am. I have blocked this spot for him. I left a detailed message for him to call us back so I can get him scheduled for the PFT as well as the COVID test.   Will leave this encounter open for follow up.

## 2019-11-16 ENCOUNTER — Other Ambulatory Visit: Payer: Self-pay | Admitting: Pharmacist

## 2019-11-16 ENCOUNTER — Other Ambulatory Visit: Payer: Self-pay

## 2019-11-16 NOTE — Patient Outreach (Signed)
[facility]) Care Management  [place]   11/16/2019  Coles 1946-08-27 [id number]  Reason for referral: Medication Assistance  Referral source: [facility] RN Current insurance: [facility]  Referral received from [place] for medication assistance.  Patient's provider is Dr. Kennith Maes with [facility].  PCP has contact with [facility] pharmacist in office to see patients.  Pharmacist is Dr. Lelon Frohlich.  Message left with pharmacist telephonically and referral also faxed to pharmacist.   Plan: Will close [facility] pharmacy case.   Ralene Bathe, PharmD, [place] [phone]

## 2019-11-16 NOTE — Patient Outreach (Signed)
New referral:  Reviewed referral for medication assistance.   Placed call to patient and completed screening. Patient reports he needs help with cost of medications. Reports using [employer] in [place].  Reports he has high co pay.  Denies any nursing concerns today. States he needs help with medication cost.  PLAN: referral placed for pharmacy. PCP Dr. Kennith Maes. Updated in [place]. Will close case to nursing as no needs identified.  Tomasa Rand, RN, BSN, CEN [facility] [employer] [phone]

## 2019-11-19 NOTE — Telephone Encounter (Signed)
Nothing entered in chart. Encounter opened in error

## 2019-11-20 ENCOUNTER — Encounter: Payer: Self-pay | Admitting: Cardiology

## 2019-11-20 ENCOUNTER — Other Ambulatory Visit: Payer: Self-pay

## 2019-11-20 ENCOUNTER — Ambulatory Visit (INDEPENDENT_AMBULATORY_CARE_PROVIDER_SITE_OTHER): Payer: Medicare Other | Admitting: Cardiology

## 2019-11-20 VITALS — BP 132/84 | HR 63 | Ht 69.0 in | Wt 195.0 lb

## 2019-11-20 DIAGNOSIS — J431 Panlobular emphysema: Secondary | ICD-10-CM

## 2019-11-20 DIAGNOSIS — R0609 Other forms of dyspnea: Secondary | ICD-10-CM

## 2019-11-20 DIAGNOSIS — R0600 Dyspnea, unspecified: Secondary | ICD-10-CM

## 2019-11-20 DIAGNOSIS — E782 Mixed hyperlipidemia: Secondary | ICD-10-CM

## 2019-11-20 DIAGNOSIS — E088 Diabetes mellitus due to underlying condition with unspecified complications: Secondary | ICD-10-CM

## 2019-11-20 DIAGNOSIS — I10 Essential (primary) hypertension: Secondary | ICD-10-CM

## 2019-11-20 DIAGNOSIS — I2510 Atherosclerotic heart disease of native coronary artery without angina pectoris: Secondary | ICD-10-CM

## 2019-11-20 DIAGNOSIS — I25119 Atherosclerotic heart disease of native coronary artery with unspecified angina pectoris: Secondary | ICD-10-CM | POA: Diagnosis not present

## 2019-11-20 DIAGNOSIS — I4821 Permanent atrial fibrillation: Secondary | ICD-10-CM

## 2019-11-20 DIAGNOSIS — Z9861 Coronary angioplasty status: Secondary | ICD-10-CM

## 2019-11-20 DIAGNOSIS — Z87891 Personal history of nicotine dependence: Secondary | ICD-10-CM

## 2019-11-20 MED ORDER — METOLAZONE 2.5 MG PO TABS: 2.5000 mg | ORAL_TABLET | Freq: Every day | ORAL | 12 refills | Status: DC

## 2019-11-20 MED ORDER — METOPROLOL SUCCINATE ER 25 MG PO TB24: 12.5000 mg | ORAL_TABLET | Freq: Every day | ORAL | 1 refills | Status: DC

## 2019-11-20 NOTE — Addendum Note (Signed)
Addended by: Truddie Hidden on: 11/20/2019 09:43 AM   Modules accepted: Orders

## 2019-11-20 NOTE — Progress Notes (Signed)
Cardiology Office Note:    Date:  11/20/2019   ID:  Juan Valdez, DOB 06/21/1946, MRN [id number]  PCP:  Juan Hipps, MD  Cardiologist:  Juan Lindau, MD   Referring MD: No ref. provider found    ASSESSMENT:    1. Permanent atrial fibrillation ([place])   2. Coronary artery disease involving native coronary artery of native heart without angina pectoris   3. Essential hypertension   4. Coronary artery disease involving native coronary artery of native heart with angina pectoris ([place])   5. CAD S/P percutaneous coronary angioplasty   6. Mixed dyslipidemia   7. Diabetes mellitus due to underlying condition with unspecified complications ([place])   8. Panlobular emphysema ([place])   9. Former smoker   12. DOE (dyspnea on exertion)    PLAN:    In order of problems listed above:  1. Shortness of breath on exertion: This is significant.  He is being evaluated by our pulmonary colleagues and I appreciate their input.  In view of the significance of his symptoms I changed his medications from torsemide to metolazone 2.5 mg daily.  I reviewed his recent Chem-7.  He will be back in the next 8 to 10 days in follow-up.  Salt intake issues and diet was discussed.  He was advised to weigh himself on a regular basis.  Because of his shortness of breath I told him to put a hold on rehab at this time. 2. Coronary artery disease: Stable at this time.  No symptoms of angina. 3. Mixed dyslipidemia.  Patient on statin therapy 4. He will be seen in the follow-up appointment in the next few days.  He knows to go to the nearest emergency room for any concerning symptoms.   Medication Adjustments/Labs and Tests Ordered: Current medicines are reviewed at length with the patient today.  Concerns regarding medicines are outlined above.  No orders of the defined types were placed in this encounter.  No orders of the defined types were placed in this encounter.    Chief Complaint  Patient presents with    . Shortness of Breath     History of Present Illness:    Juan Valdez is a 74 y.o. male.  Patient has past medical history of coronary artery disease post stenting to the right coronary artery with mildly depressed ejection fraction.,  Essential hypertension and diabetes.  Patient has history of lung cancer.  He gives history of shortness of breath especially on exertion.  He has issues with this for the past few days.  He is seeing his pulmonologist.  I reviewed the blood work.  His BNP is not markedly elevated.  At the time of my evaluation, the patient is alert awake oriented and in no distress.  Past Medical History:  Diagnosis Date  . Anemia   . Atrial fibrillation ([place]) 09/07/2018  . CAD (coronary artery disease)    a. s/p prior DES to RCA b. cath in 07/2019 showing 75% Prox to mid RCA stenosis, 5% ISR along previously placed distal RCA stent, 85% Prox LCx stenosis, and 100% CTO of distal LCx with filled from L--> L collaterals. Also had a long-diffuse 65-75% stenosis along proximal to mid-LAD. Unable to fully expand balloon for PCI of RCA and lesion reduced to 65%. Future atherectotmy of RCA and LCx.   . Colon polyp   . COPD (chronic obstructive pulmonary disease) ([place])   . Diabetes ([place])   . Diverticulosis   . GERD (gastroesophageal  reflux disease)   . Gout   . HLD (hyperlipidemia)   . Lung cancer ([place])   . Perforated bowel [facility])     Past Surgical History:  Procedure Laterality Date  . ABDOMINAL SURGERY    . CORONARY ANGIOPLASTY WITH STENT PLACEMENT  2003  . CORONARY ATHERECTOMY N/A 08/22/2019   Procedure: CORONARY ATHERECTOMY;  Surgeon: Leonie Man, MD;  Location: [place] CV LAB;  Service: Cardiovascular;  Laterality: N/A;  . CORONARY BALLOON ANGIOPLASTY N/A 07/20/2019   Procedure: CORONARY BALLOON ANGIOPLASTY;  Surgeon: Leonie Man, MD;  Location: [place] CV LAB;  Service: Cardiovascular;  Laterality: N/A;  RCA  . CORONARY BALLOON ANGIOPLASTY N/A  08/22/2019   Procedure: CORONARY BALLOON ANGIOPLASTY;  Surgeon: Leonie Man, MD;  Location: [place] [facility] LAB;  Service: Cardiovascular;  Laterality: N/A;  . CORONARY STENT INTERVENTION  08/22/2019  . CORONARY STENT INTERVENTION N/A 08/22/2019   Procedure: CORONARY STENT INTERVENTION;  Surgeon: Leonie Man, MD;  Location: [place] [facility];  Service: Cardiovascular;  Laterality: N/A;  . LEFT HEART CATH AND CORONARY ANGIOGRAPHY N/A 07/20/2019   Procedure: LEFT HEART CATH AND CORONARY ANGIOGRAPHY;  Surgeon: Leonie Man, MD;  Location: [place] CV LAB;  Service: Cardiovascular;  Laterality: N/A;  . LEFT HEART CATH AND CORONARY ANGIOGRAPHY N/A 08/22/2019   Procedure: LEFT HEART CATH AND CORONARY ANGIOGRAPHY;  Surgeon: Leonie Man, MD;  Location: [place] CV LAB;  Service: Cardiovascular;  Laterality: N/A;  . LUNG CANCER SURGERY    . ROTATOR CUFF REPAIR Left   . TEMPORARY PACEMAKER N/A 08/22/2019   Procedure: TEMPORARY PACEMAKER;  Surgeon: Leonie Man, MD;  Location: [place] CV LAB;  Service: Cardiovascular;  Laterality: N/A;  . TONSILLECTOMY     age 21    Current Medications: Current Meds  Medication Sig  . ALAWAY 0.025 % ophthalmic solution Place 1 drop into both eyes 2 (two) times daily as needed (allergy/irritated eyes.).  Marland Kitchen albuterol (PROVENTIL) (2.5 MG/3ML) 0.083% nebulizer solution Take 3 mLs (2.5 mg total) by nebulization every 6 (six) hours as needed for up to 4 doses for wheezing or shortness of breath.  Marland Kitchen albuterol (VENTOLIN HFA) 108 (90 Base) MCG/ACT inhaler Inhale 2 puffs into the lungs every 6 (six) hours as needed for wheezing or shortness of breath.  . allopurinol (ZYLOPRIM) 300 MG tablet Take 300 mg by mouth daily.  . Ascorbic Acid (VITAMIN C) 1000 MG tablet Take 1,000 mg by mouth daily.  Marland Kitchen atorvastatin (LIPITOR) 40 MG tablet Take 40 mg by mouth every evening.   . Calcium Carb-Cholecalciferol (CALCIUM 600+D3 PO) Take 1 tablet by mouth daily.  .  Cholecalciferol (VITAMIN D3) 50 MCG (2000 UT) TABS Take 2,000 Units by mouth daily.  . clopidogrel (PLAVIX) 75 MG tablet Take 1 tablet (75 mg total) by mouth daily.  Marland Kitchen ELIQUIS 5 MG TABS tablet TAKE 1 TABLET BY MOUTH TWICE DAILY  . Fluticasone-Umeclidin-Vilant (TRELEGY ELLIPTA) 100-62.5-25 MCG/INH AEPB Inhale 1 puff into the lungs daily.  . Fluticasone-Umeclidin-Vilant (TRELEGY ELLIPTA) 100-62.5-25 MCG/INH AEPB Inhale 1 puff into the lungs daily.  Marland Kitchen glipiZIDE (GLUCOTROL XL) 2.5 MG 24 hr tablet Take 2.5 mg by mouth daily with breakfast.  . HOMEOPATHIC PRODUCTS EX Apply 1 application topically 2 (two) times daily as needed (diabetic pain.). Topricin Foot Pain Relief Cream  . HYDROcodone-acetaminophen (HYCET) 7.5-325 mg/15 ml solution Take 1 mL by mouth 2 (two) times daily as needed for pain.  Marland Kitchen ipratropium (ATROVENT) 0.03 %  nasal spray Place 2 sprays into both nostrils every 12 (twelve) hours as needed for rhinitis.  . metoprolol succinate (TOPROL-XL) 25 MG 24 hr tablet Take 12.5 mg by mouth daily.   . nitroGLYCERIN (NITROSTAT) 0.4 MG SL tablet DISSOLVE 1 TABLET UNDER THE TONGUE EVERY 5 MINUTES AS NEEDED FOR CHEST PAIN  . Omega-3 Fatty Acids (FISH OIL) 1200 MG CAPS Take 1,200 mg by mouth daily.  Vladimir Faster Glycol-Propyl Glycol (SYSTANE) 0.4-0.3 % SOLN Place 1 drop into both eyes 2 (two) times daily as needed (dry/irritated eyes).  . potassium gluconate 595 (99 K) MG TABS tablet Take 595 mg by mouth every other day.  . tamsulosin (FLOMAX) 0.4 MG CAPS capsule Take 0.4 mg by mouth daily after supper.   . torsemide (DEMADEX) 10 MG tablet Take 1 tablet (10 mg total) by mouth daily.  . traZODone (DESYREL) 150 MG tablet Take 150 mg by mouth at bedtime.   . triamcinolone cream (KENALOG) 0.1 % Apply 1 application topically daily.     Allergies:   Patient has no known allergies.   Social History   Socioeconomic History  . Marital status: Widowed    Spouse name: Not on file  . Number of children: 0  .  Years of education: Not on file  . Highest education level: Not on file  Occupational History  . Occupation: retired  Tobacco Use  . Smoking status: Former Smoker    Packs/day: 3.00    Years: 57.00    Pack years: 171.00    Types: Cigarettes    Start date: 44    Quit date: 06/13/2014    Years since quitting: 5.4  . Smokeless tobacco: Never Used  Substance and Sexual Activity  . Alcohol use: Not Currently  . Drug use: Never  . Sexual activity: Not on file  Other Topics Concern  . Not on file  Social History Narrative  . Not on file   Social Determinants of Health   Financial Resource Strain:   . Difficulty of Paying Living Expenses: Not on file  Food Insecurity:   . Worried About Charity fundraiser in the Last Year: Not on file  . Ran Out of Food in the Last Year: Not on file  Transportation Needs:   . Lack of Transportation (Medical): Not on file  . Lack of Transportation (Non-Medical): Not on file  Physical Activity:   . Days of Exercise per Week: Not on file  . Minutes of Exercise per Session: Not on file  Stress:   . Feeling of Stress : Not on file  Social Connections:   . Frequency of Communication with Friends and Family: Not on file  . Frequency of Social Gatherings with Friends and Family: Not on file  . Attends Religious Services: Not on file  . Active Member of Clubs or Organizations: Not on file  . Attends Archivist Meetings: Not on file  . Marital Status: Not on file     Family History: The patient's family history includes Cancer in his father. He was adopted.  ROS:   Please see the history of present illness.    All other systems reviewed and are negative.  EKGs/Labs/Other Studies Reviewed:    The following studies were reviewed today: I reviewed lab work from pulmonology evaluation.   Recent Labs: 03/08/2019: TSH 1.080 05/11/2019: Magnesium 1.9 07/08/2019: B Natriuretic Peptide 87.7 11/14/2019: ALT 16; BUN 23; Creatinine, Ser 1.46;  Hemoglobin 16.4; NT-Pro BNP 616; Platelets 119.0; Potassium 4.5;  Sodium 137  Recent Lipid Panel    Component Value Date/Time   CHOL 128 08/30/2019 0852   TRIG 145 08/30/2019 0852   HDL 37 (L) 08/30/2019 0852   CHOLHDL 3.5 08/30/2019 0852   LDLCALC 66 08/30/2019 0852    Physical Exam:    VS:  BP 132/84 (BP Location: Left Arm, Patient Position: Sitting, Cuff Size: Normal)   Pulse 63   Ht 5\' 9"  (1.753 m)   Wt 195 lb (88.5 kg)   SpO2 93%   BMI 28.80 kg/m     Wt Readings from Last 3 Encounters:  11/20/19 195 lb (88.5 kg)  11/14/19 194 lb (88 kg)  09/28/19 203 lb (92.1 kg)     GEN: Patient is in no acute distress HEENT: Normal NECK: No JVD; No carotid bruits LYMPHATICS: No lymphadenopathy CARDIAC: Hear sounds regular, 2/6 systolic murmur at the apex. RESPIRATORY:  Clear to auscultation without rales, wheezing or rhonchi  ABDOMEN: Soft, non-tender, non-distended MUSCULOSKELETAL:  No edema; No deformity  SKIN: Warm and dry NEUROLOGIC:  Alert and oriented x 3 PSYCHIATRIC:  Normal affect   Signed, Juan Lindau, MD  11/20/2019 9:33 AM    [place]

## 2019-11-20 NOTE — Patient Instructions (Signed)
Medication Instructions:  Your physician has recommended you make the following change in your medication:   Stop Furosemide and Torsemide. Start Metolazone 2.5 mg daily.   *If you need a refill on your cardiac medications before your next appointment, please call your pharmacy*   Lab Work: None ordered If you have labs (blood work) drawn today and your tests are completely normal, you will receive your results only by: Marland Kitchen MyChart Message (if you have MyChart) OR . A paper copy in the mail If you have any lab test that is abnormal or we need to change your treatment, we will call you to review the results.   Testing/Procedures: None ordered   Follow-Up: At [facility], you and your health needs are our priority.  As part of our continuing mission to provide you with exceptional heart care, we have created designated Provider Care Teams.  These Care Teams include your primary Cardiologist (physician) and Advanced Practice Providers (APPs -  Physician Assistants and Nurse Practitioners) who all work together to provide you with the care you need, when you need it.  We recommend signing up for the patient portal called "MyChart".  Sign up information is provided on this After Visit Summary.  MyChart is used to connect with patients for Virtual Visits (Telemedicine).  Patients are able to view lab/test results, encounter notes, upcoming appointments, etc.  Non-urgent messages can be sent to your provider as well.   To learn more about what you can do with MyChart, go to [email or web address].    Your next appointment:   2 week(s)  The format for your next appointment:   In Person  Provider:   Jyl Heinz, MD   Other Instructions NA

## 2019-11-22 DIAGNOSIS — M170 Bilateral primary osteoarthritis of knee: Secondary | ICD-10-CM | POA: Diagnosis not present

## 2019-11-24 ENCOUNTER — Other Ambulatory Visit (HOSPITAL_COMMUNITY)
Admission: RE | Admit: 2019-11-24 | Discharge: 2019-11-24 | Disposition: A | Discharge status: Home / Self Care | Payer: Medicare Other | Source: Ambulatory Visit | Attending: Pulmonary Disease | Admitting: Pulmonary Disease

## 2019-11-24 DIAGNOSIS — Z01812 Encounter for preprocedural laboratory examination: Secondary | ICD-10-CM | POA: Insufficient documentation

## 2019-11-24 DIAGNOSIS — Z20822 Contact with and (suspected) exposure to covid-19: Secondary | ICD-10-CM | POA: Diagnosis not present

## 2019-11-24 LAB — SARS CORONAVIRUS 2 (TAT 6-24 HRS): SARS Coronavirus 2: NEGATIVE

## 2019-11-27 ENCOUNTER — Encounter: Payer: Self-pay | Admitting: Adult Health

## 2019-11-27 ENCOUNTER — Ambulatory Visit (INDEPENDENT_AMBULATORY_CARE_PROVIDER_SITE_OTHER): Payer: Medicare Other | Admitting: Emergency Medicine

## 2019-11-27 ENCOUNTER — Ambulatory Visit (INDEPENDENT_AMBULATORY_CARE_PROVIDER_SITE_OTHER): Payer: Medicare Other | Admitting: Adult Health

## 2019-11-27 ENCOUNTER — Other Ambulatory Visit: Payer: Self-pay

## 2019-11-27 VITALS — BP 126/76 | HR 75 | Temp 97.7°F | Ht 69.0 in | Wt 188.0 lb

## 2019-11-27 DIAGNOSIS — I5042 Chronic combined systolic (congestive) and diastolic (congestive) heart failure: Secondary | ICD-10-CM | POA: Diagnosis not present

## 2019-11-27 DIAGNOSIS — J449 Chronic obstructive pulmonary disease, unspecified: Secondary | ICD-10-CM

## 2019-11-27 DIAGNOSIS — C3491 Malignant neoplasm of unspecified part of right bronchus or lung: Secondary | ICD-10-CM

## 2019-11-27 DIAGNOSIS — J431 Panlobular emphysema: Secondary | ICD-10-CM

## 2019-11-27 DIAGNOSIS — I509 Heart failure, unspecified: Secondary | ICD-10-CM

## 2019-11-27 HISTORY — DX: Heart failure, unspecified: I50.9

## 2019-11-27 LAB — PULMONARY FUNCTION TEST
DL/VA % pred: 98 %
DL/VA: 3.92 ml/min/mmHg/L
DLCO cor % pred: 79 %
DLCO cor: 19.35 ml/min/mmHg
DLCO unc % pred: 83 %
DLCO unc: 20.27 ml/min/mmHg
FEF 25-75 Post: 1.21 L/sec
FEF 25-75 Pre: 0.66 L/sec
FEF2575-%Change-Post: 82 %
FEF2575-%Pred-Post: 56 %
FEF2575-%Pred-Pre: 31 %
FEV1-%Change-Post: 19 %
FEV1-%Pred-Post: 61 %
FEV1-%Pred-Pre: 51 %
FEV1-Post: 1.8 L
FEV1-Pre: 1.51 L
FEV1FVC-%Change-Post: 6 %
FEV1FVC-%Pred-Pre: 82 %
FEV6-%Change-Post: 13 %
FEV6-%Pred-Post: 72 %
FEV6-%Pred-Pre: 63 %
FEV6-Post: 2.77 L
FEV6-Pre: 2.43 L
FEV6FVC-%Change-Post: 3 %
FEV6FVC-%Pred-Post: 105 %
FEV6FVC-%Pred-Pre: 102 %
FVC-%Change-Post: 12 %
FVC-%Pred-Post: 69 %
FVC-%Pred-Pre: 61 %
FVC-Post: 2.82 L
FVC-Pre: 2.52 L
Post FEV1/FVC ratio: 64 %
Post FEV6/FVC ratio: 99 %
Pre FEV1/FVC ratio: 60 %
Pre FEV6/FVC Ratio: 96 %
RV % pred: 172 %
RV: 4.3 L
TLC % pred: 101 %
TLC: 6.94 L

## 2019-11-27 MED ORDER — TRELEGY ELLIPTA 100-62.5-25 MCG/INH IN AEPB: 1.0000 | INHALATION_SPRAY | Freq: Every day | RESPIRATORY_TRACT | 0 refills | Status: DC

## 2019-11-27 NOTE — Patient Instructions (Addendum)
Continue on TRELEGY 1 puff daily  Albuterol Inhaler or Neb As needed  Wheezing /shortness of breath .  Activity as tolerated.  Set up CT chest  Follow up with Dr. Lamonte Sakai  In 4 months and As needed

## 2019-11-27 NOTE — Progress Notes (Signed)
Full PFT performed today. °

## 2019-11-27 NOTE — Assessment & Plan Note (Signed)
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 Needs to continue with surveillance CT chest.  This was done in [place]. Patient had a CT chest and 2020 that showed no evidence of recurrent disease however did show enlarged precarinal and right hilar lymph nodes questionable etiology.  We will repeat CT chest unfortunately is unable to take contrast due to chronic kidney disease. We will perform noncontrasted CT.

## 2019-11-27 NOTE — Assessment & Plan Note (Signed)
Mild volume overload last office visit with recent visit with cardiology changing diuretics.  Patient's weight is down with decreased edema. Last echo February 2020 showed EF of 45 to 50%. Continue follow-up with cardiology.  Low-salt diet.

## 2019-11-27 NOTE — Progress Notes (Signed)
@Patient  ID: Juan Valdez, male    DOB: 1946-02-02, 74 y.o.   MRN: [id number]  Chief Complaint  Patient presents with  . Follow-up    COPD     Referring provider: No ref. provider found  HPI: 74 yo male former smoker (100-pack-year) seen March 2020 to establish for COPD Medical history significant for CAD, A. fib, diabetes History of non-small cell lung cancer status post resection 2015-right lower lobectomy (no XRT or chemo)  TEST/EVENTS :  10/18/2018  Echo: EF of 45-50%, Normal RV systolic function, Severe dilation of LA,. Mildly dilated RA, MV regurge and moderate thickening, Mild thickening of a tricuspic  Aortic valve with mild thickening.  Left heart cath November 2020 severe two-vessel coronary artery disease status post PTCA Status post drug-eluting stent August 22, 2019,   CT chest October 2020 - for PE, ascending thoracic aorta measures 4 x 4 cm.,  Mildly enlarged precarinal and right hilar lymph nodes questionable etiology   11/27/2019 Follow up : COPD  Patient returns for a 2-week follow-up.  Patient has underlying COPD with previous heavy smoking history.  He remains on Trelegy.  Says he gets short of breath with heavy activity.  He has had an extensive cardiac work-up with known coronary artery disease with previous stents.  Patient says his breathing is doing about the same.  If he tries to do heavy activity he gets winded.  He is able to do light chores at the house and outside. He denies any increased cough or wheezing. Has had both of his Covid vaccines. Pulmonary function testing was done today and shows moderate COPD with an FEV1 at 61%, ratio 64, FVC 69%, significant bronchodilator response, significant mid flow obstruction and reversibility.  DLCO 83%.  Patient has a known history of lung cancer in 2015 status post resection with right lower lobectomy.  Patient had a CT chest in October 2020 for chest pain.  It was negative for PE it did show mildly  enlarged precarinal and right hilar lymph nodes.  He denies any hemoptysis or unintentional weight loss  No Known Allergies  Immunization History  Administered Date(s) Administered  . Influenza, High Dose Seasonal PF 05/15/2019  . Influenza-Unspecified 07/14/2018  . PFIZER SARS-COV-2 Vaccination 09/24/2019, 10/15/2019    Past Medical History:  Diagnosis Date  . Anemia   . Atrial fibrillation ([place]) 09/07/2018  . CAD (coronary artery disease)    a. s/p prior DES to RCA b. cath in 07/2019 showing 75% Prox to mid RCA stenosis, 5% ISR along previously placed distal RCA stent, 85% Prox LCx stenosis, and 100% CTO of distal LCx with filled from L--> L collaterals. Also had a long-diffuse 65-75% stenosis along proximal to mid-LAD. Unable to fully expand balloon for PCI of RCA and lesion reduced to 65%. Future atherectotmy of RCA and LCx.   . Colon polyp   . COPD (chronic obstructive pulmonary disease) ([place])   . Diabetes ([place])   . Diverticulosis   . GERD (gastroesophageal reflux disease)   . Gout   . HLD (hyperlipidemia)   . Lung cancer ([place])   . Perforated bowel ([place])     Tobacco History: Social History   Tobacco Use  Smoking Status Former Smoker  . Packs/day: 3.00  . Years: 57.00  . Pack years: 171.00  . Types: Cigarettes  . Start date: 94  . Quit date: 06/13/2014  . Years since quitting: 5.4  Smokeless Tobacco Never Used   Counseling given: Not Answered  Outpatient Medications Prior to Visit  Medication Sig Dispense Refill  . ALAWAY 0.025 % ophthalmic solution Place 1 drop into both eyes 2 (two) times daily as needed (allergy/irritated eyes.).    Marland Kitchen albuterol (PROVENTIL) (2.5 MG/3ML) 0.083% nebulizer solution Take 3 mLs (2.5 mg total) by nebulization every 6 (six) hours as needed for up to 4 doses for wheezing or shortness of breath. 1080 mL 0  . albuterol (VENTOLIN HFA) 108 (90 Base) MCG/ACT inhaler Inhale 2 puffs into the lungs every 6 (six) hours as needed for wheezing  or shortness of breath. 24 g 1  . allopurinol (ZYLOPRIM) 300 MG tablet Take 300 mg by mouth daily.    . Ascorbic Acid (VITAMIN C) 1000 MG tablet Take 1,000 mg by mouth daily.    Marland Kitchen atorvastatin (LIPITOR) 40 MG tablet Take 40 mg by mouth every evening.     . Calcium Carb-Cholecalciferol (CALCIUM 600+D3 PO) Take 1 tablet by mouth daily.    . Cholecalciferol (VITAMIN D3) 50 MCG (2000 UT) TABS Take 2,000 Units by mouth daily.    . clopidogrel (PLAVIX) 75 MG tablet Take 1 tablet (75 mg total) by mouth daily. 90 tablet 3  . ELIQUIS 5 MG TABS tablet TAKE 1 TABLET BY MOUTH TWICE DAILY 180 tablet 1  . Fluticasone-Umeclidin-Vilant (TRELEGY ELLIPTA) 100-62.5-25 MCG/INH AEPB Inhale 1 puff into the lungs daily. 180 each 0  . Fluticasone-Umeclidin-Vilant (TRELEGY ELLIPTA) 100-62.5-25 MCG/INH AEPB Inhale 1 puff into the lungs daily. 2 each 0  . glipiZIDE (GLUCOTROL XL) 2.5 MG 24 hr tablet Take 2.5 mg by mouth daily with breakfast.    . HOMEOPATHIC PRODUCTS EX Apply 1 application topically 2 (two) times daily as needed (diabetic pain.). Topricin Foot Pain Relief Cream    . HYDROcodone-acetaminophen (HYCET) 7.5-325 mg/15 ml solution Take 1 mL by mouth 2 (two) times daily as needed for pain.    Marland Kitchen ipratropium (ATROVENT) 0.03 % nasal spray Place 2 sprays into both nostrils every 12 (twelve) hours as needed for rhinitis. 30 mL 6  . metolazone (ZAROXOLYN) 2.5 MG tablet Take 1 tablet (2.5 mg total) by mouth daily. 30 tablet 12  . metoprolol succinate (TOPROL-XL) 25 MG 24 hr tablet Take 0.5 tablets (12.5 mg total) by mouth daily. 45 tablet 1  . nitroGLYCERIN (NITROSTAT) 0.4 MG SL tablet DISSOLVE 1 TABLET UNDER THE TONGUE EVERY 5 MINUTES AS NEEDED FOR CHEST PAIN 25 tablet 3  . Omega-3 Fatty Acids (FISH OIL) 1200 MG CAPS Take 1,200 mg by mouth daily.    Vladimir Faster Glycol-Propyl Glycol (SYSTANE) 0.4-0.3 % SOLN Place 1 drop into both eyes 2 (two) times daily as needed (dry/irritated eyes).    . potassium gluconate 595 (99 K)  MG TABS tablet Take 595 mg by mouth every other day.    . tamsulosin (FLOMAX) 0.4 MG CAPS capsule Take 0.4 mg by mouth daily after supper.     . torsemide (DEMADEX) 10 MG tablet Take 1 tablet (10 mg total) by mouth daily. 90 tablet 3  . traZODone (DESYREL) 150 MG tablet Take 150 mg by mouth at bedtime.     . triamcinolone cream (KENALOG) 0.1 % Apply 1 application topically daily.     No facility-administered medications prior to visit.     Review of Systems:   Constitutional:   No  weight loss, night sweats,  Fevers, chills,  +fatigue, or  lassitude.  HEENT:   No headaches,  Difficulty swallowing,  Tooth/dental problems, or  Sore throat,  No sneezing, itching, ear ache, nasal congestion, post nasal drip,   CV:  No chest pain,  Orthopnea, PND, swelling in lower extremities, anasarca, dizziness, palpitations, syncope.   GI  No heartburn, indigestion, abdominal pain, nausea, vomiting, diarrhea, change in bowel habits, loss of appetite, bloody stools.   Resp:   No chest wall deformity  Skin: no rash or lesions.  GU: no dysuria, change in color of urine, no urgency or frequency.  No flank pain, no hematuria   MS:  No joint pain or swelling.  No decreased range of motion.  No back pain.    Physical Exam  BP 126/76 (BP Location: Left Arm, Cuff Size: Normal)   Pulse 75   Temp 97.7 F (36.5 C) (Temporal)   Ht 5\' 9"  (1.753 m)   Wt 188 lb (85.3 kg)   SpO2 95%   BMI 27.76 kg/m   GEN: A/Ox3; pleasant , NAD, elderly    HEENT:  [place]/AT,  NOSE-clear, THROAT-clear, no lesions, no postnasal drip or exudate noted.   NECK:  Supple w/ fair ROM; no JVD; normal carotid impulses w/o bruits; no thyromegaly or nodules palpated; no lymphadenopathy.    RESP  Clear  P & A; w/o, wheezes/ rales/ or rhonchi. no accessory muscle use, no dullness to percussion  CARD:  RRR, no m/r/g, no peripheral edema, pulses intact, no cyanosis or clubbing.  GI:   Soft & nt; nml bowel sounds; no  organomegaly or masses detected.   Musco: Warm bil, no deformities or joint swelling noted.   Neuro: alert, no focal deficits noted.    Skin: Warm, no lesions or rashes    Lab Results:  CBC    Component Value Date/Time   WBC 7.3 11/14/2019 1134   RBC 5.35 11/14/2019 1134   HGB 16.4 11/14/2019 1134   HGB 14.6 08/30/2019 0857   HCT 48.0 11/14/2019 1134   HCT 42.5 08/30/2019 0857   PLT 119.0 (L) 11/14/2019 1134   PLT 134 (L) 08/30/2019 0857   MCV 89.7 11/14/2019 1134   MCV 89 08/30/2019 0857   MCH 30.5 08/30/2019 0857   MCH 30.3 08/23/2019 0424   MCHC 34.2 11/14/2019 1134   RDW 15.0 11/14/2019 1134   RDW 14.4 08/30/2019 0857   LYMPHSABS 0.7 11/14/2019 1134   MONOABS 0.6 11/14/2019 1134   EOSABS 0.1 11/14/2019 1134   BASOSABS 0.0 11/14/2019 1134    BMET    Component Value Date/Time   NA 137 11/14/2019 1134   NA 140 08/30/2019 0852   K 4.5 11/14/2019 1134   CL 98 11/14/2019 1134   CO2 33 (H) 11/14/2019 1134   GLUCOSE 156 (H) 11/14/2019 1134   BUN 23 11/14/2019 1134   BUN 19 08/30/2019 0852   CREATININE 1.46 11/14/2019 1134   CALCIUM 10.3 11/14/2019 1134   GFRNONAA 61 08/30/2019 0852   GFRAA 71 08/30/2019 0852    BNP    Component Value Date/Time   BNP 87.7 07/08/2019 0543    ProBNP    Component Value Date/Time   PROBNP 616 (H) 11/14/2019 1134    Imaging: DG Chest 2 View  Result Date: 11/14/2019 CLINICAL DATA:  Shortness of breath.  COPD. EXAM: CHEST - 2 VIEW COMPARISON:  CT chest 07/08/2019 and chest radiograph 07/07/2019. FINDINGS: Trachea is midline. Heart is at the upper limits of normal in size to mildly enlarged. Minimal volume loss at the base of the left hemithorax, adjacent to a chronically elevated left hemidiaphragm. Lungs are otherwise clear. No  pleural fluid. IMPRESSION: 1. No acute findings. 2. Chronic left hemidiaphragm elevation. Electronically Signed   By: Lorin Picket M.D.   On: 11/14/2019 11:40      PFT Results Latest Ref Rng &  Units 11/27/2019  FVC-Pre L 2.52  FVC-Predicted Pre % 61  FVC-Post L 2.82  FVC-Predicted Post % 69  Pre FEV1/FVC % % 60  Post FEV1/FCV % % 64  FEV1-Pre L 1.51  FEV1-Predicted Pre % 51  FEV1-Post L 1.80  DLCO UNC% % 83  DLCO COR %Predicted % 98  TLC L 6.94  TLC % Predicted % 101  RV % Predicted % 172    No results found for: NITRICOXIDE      Assessment & Plan:   COPD (chronic obstructive pulmonary disease) (HCC) Moderate COPD with an asthmatic component. Patient is continue on aggressive maintenance regimen with triple therapy-Trelegy. Activity as tolerated.   Plan  Patient Instructions  Continue on TRELEGY 1 puff daily  Albuterol Inhaler or Neb As needed  Wheezing /shortness of breath .  Activity as tolerated.  Set up CT chest  Follow up with Dr. Lamonte Sakai  In 4 months and As needed        Non-small cell cancer of right lung [facility] Patient is status post right lower lobectomy for non-small cell carcinoma in 2015. Needs to continue with surveillance CT chest.  This was done in [place]. Patient had a CT chest and 2020 that showed no evidence of recurrent disease however did show enlarged precarinal and right hilar lymph nodes questionable etiology.  We will repeat CT chest unfortunately is unable to take contrast due to chronic kidney disease. We will perform noncontrasted CT.  CHF (congestive heart failure) (HCC) Mild volume overload last office visit with recent visit with cardiology changing diuretics.  Patient's weight is down with decreased edema. Last echo February 2020 showed EF of 45 to 50%. Continue follow-up with cardiology.  Low-salt diet.    Total patient care time 45 minutes  Anglia Blakley, NP 11/27/2019

## 2019-11-27 NOTE — Assessment & Plan Note (Signed)
Moderate COPD with an asthmatic component. Patient is continue on aggressive maintenance regimen with triple therapy-Trelegy. Activity as tolerated.   Plan  Patient Instructions  Continue on TRELEGY 1 puff daily  Albuterol Inhaler or Neb As needed  Wheezing /shortness of breath .  Activity as tolerated.  Set up CT chest  Follow up with Dr. Lamonte Sakai  In 4 months and As needed

## 2019-11-27 NOTE — Addendum Note (Signed)
Addended by: Vanessa Barbara on: 11/27/2019 01:57 PM   Modules accepted: Orders

## 2019-12-03 ENCOUNTER — Inpatient Hospital Stay: Admission: RE | Admit: 2019-12-03 | Payer: Medicare Other | Source: Ambulatory Visit

## 2019-12-04 ENCOUNTER — Ambulatory Visit (INDEPENDENT_AMBULATORY_CARE_PROVIDER_SITE_OTHER): Payer: Medicare Other | Admitting: Cardiology

## 2019-12-04 ENCOUNTER — Telehealth: Payer: Self-pay | Admitting: Adult Health

## 2019-12-04 ENCOUNTER — Encounter: Payer: Self-pay | Admitting: Cardiology

## 2019-12-04 ENCOUNTER — Other Ambulatory Visit: Payer: Self-pay

## 2019-12-04 VITALS — BP 142/78 | HR 70 | Ht 69.0 in | Wt 190.0 lb

## 2019-12-04 DIAGNOSIS — I2510 Atherosclerotic heart disease of native coronary artery without angina pectoris: Secondary | ICD-10-CM

## 2019-12-04 DIAGNOSIS — E088 Diabetes mellitus due to underlying condition with unspecified complications: Secondary | ICD-10-CM

## 2019-12-04 DIAGNOSIS — Z87891 Personal history of nicotine dependence: Secondary | ICD-10-CM | POA: Diagnosis not present

## 2019-12-04 DIAGNOSIS — I10 Essential (primary) hypertension: Secondary | ICD-10-CM

## 2019-12-04 DIAGNOSIS — Z9861 Coronary angioplasty status: Secondary | ICD-10-CM

## 2019-12-04 DIAGNOSIS — R590 Localized enlarged lymph nodes: Secondary | ICD-10-CM

## 2019-12-04 DIAGNOSIS — I5022 Chronic systolic (congestive) heart failure: Secondary | ICD-10-CM

## 2019-12-04 NOTE — Progress Notes (Signed)
Cardiology Office Note:    Date:  12/04/2019   ID:  Juan Valdez, DOB 20-Sep-1945, MRN [id number]  PCP:  Ronita Hipps, MD  Cardiologist:  Jenean Lindau, MD   Referring MD: Ronita Hipps, MD    ASSESSMENT:    1. Coronary artery disease involving native coronary artery of native heart without angina pectoris   2. CAD S/P percutaneous coronary angioplasty   3. Chronic systolic congestive heart failure ([place])   4. Essential hypertension   5. Diabetes mellitus due to underlying condition with unspecified complications ([place])   6. Former smoker    PLAN:    In order of problems listed above:  1. Congestive heart failure: Symptoms are much better and the patient is significantly improved.  Patient is on diuretic therapy and tolerating well metolazone.  He will continue this medication.  He will have a Chem-7 and a BNP level today.  He will be seen in follow-up appointment in a month or earlier if he has any concerns.  Diet including salt intake issues were discussed with the patient and he understands. 2. Essential hypertension: Blood pressure is stable 3. Atrial fibrillation:I discussed with the patient atrial fibrillation, disease process. Management and therapy including rate and rhythm control, anticoagulation benefits and potential risks were discussed extensively with the patient. Patient had multiple questions which were answered to patient's satisfaction. 4. Mixed dyslipidemia: Diet was emphasized and lipids were reviewed.   Medication Adjustments/Labs and Tests Ordered: Current medicines are reviewed at length with the patient today.  Concerns regarding medicines are outlined above.  No orders of the defined types were placed in this encounter.  No orders of the defined types were placed in this encounter.    Chief Complaint  Patient presents with  . Follow-up    2 Weeks     History of Present Illness:    Juan Valdez is a 74 y.o. male.  Patient has past  medical history of coronary artery disease post stenting, mildly depressed systolic function, atrial fibrillation hypertension dyslipidemia and diabetes mellitus.  He came with shortness of breath symptoms and I switched his medications to metolazone.  He tells me he feels much better and is planning to use a bicycle on a regular basis.  No chest pain orthopnea or PND.  At the time of my evaluation, the patient is alert awake oriented and in no distress.  Past Medical History:  Diagnosis Date  . Anemia   . Atrial fibrillation ([place]) 09/07/2018  . CAD (coronary artery disease)    a. s/p prior DES to RCA b. cath in 07/2019 showing 75% Prox to mid RCA stenosis, 5% ISR along previously placed distal RCA stent, 85% Prox LCx stenosis, and 100% CTO of distal LCx with filled from L--> L collaterals. Also had a long-diffuse 65-75% stenosis along proximal to mid-LAD. Unable to fully expand balloon for PCI of RCA and lesion reduced to 65%. Future atherectotmy of RCA and LCx.   . Colon polyp   . COPD (chronic obstructive pulmonary disease) ([place])   . Diabetes ([place])   . Diverticulosis   . GERD (gastroesophageal reflux disease)   . Gout   . HLD (hyperlipidemia)   . Lung cancer ([place])   . Perforated bowel [facility])     Past Surgical History:  Procedure Laterality Date  . ABDOMINAL SURGERY    . CORONARY ANGIOPLASTY WITH STENT PLACEMENT  2003  . CORONARY ATHERECTOMY N/A 08/22/2019   Procedure: CORONARY ATHERECTOMY;  Surgeon:  Leonie Man, MD;  Location: [facility];  Service: Cardiovascular;  Laterality: N/A;  . CORONARY BALLOON ANGIOPLASTY N/A 07/20/2019   Procedure: CORONARY BALLOON ANGIOPLASTY;  Surgeon: Leonie Man, MD;  Location: [facility];  Service: Cardiovascular;  Laterality: N/A;  RCA  . CORONARY BALLOON ANGIOPLASTY N/A 08/22/2019   Procedure: CORONARY BALLOON ANGIOPLASTY;  Surgeon: Leonie Man, MD;  Location: [place] [facility];  Service: Cardiovascular;  Laterality: N/A;  .  CORONARY STENT INTERVENTION  08/22/2019  . CORONARY STENT INTERVENTION N/A 08/22/2019   Procedure: CORONARY STENT INTERVENTION;  Surgeon: Leonie Man, MD;  Location: [place];  Service: Cardiovascular;  Laterality: N/A;  . LEFT HEART CATH AND CORONARY ANGIOGRAPHY N/A 07/20/2019   Procedure: LEFT HEART CATH AND CORONARY ANGIOGRAPHY;  Surgeon: Leonie Man, MD;  Location: [facility];  Service: Cardiovascular;  Laterality: N/A;  . LEFT HEART CATH AND CORONARY ANGIOGRAPHY N/A 08/22/2019   Procedure: LEFT HEART CATH AND CORONARY ANGIOGRAPHY;  Surgeon: Leonie Man, MD;  Location: [facility];  Service: Cardiovascular;  Laterality: N/A;  . LUNG CANCER SURGERY    . ROTATOR CUFF REPAIR Left   . TEMPORARY PACEMAKER N/A 08/22/2019   Procedure: TEMPORARY PACEMAKER;  Surgeon: Leonie Man, MD;  Location: [facility];  Service: Cardiovascular;  Laterality: N/A;  . TONSILLECTOMY     age 74    Current Medications: Current Meds  Medication Sig  . ALAWAY 0.025 % ophthalmic solution Place 1 drop into both eyes 2 (two) times daily as needed (allergy/irritated eyes.).  Marland Kitchen albuterol (PROVENTIL) (2.5 MG/3ML) 0.083% nebulizer solution Take 3 mLs (2.5 mg total) by nebulization every 6 (six) hours as needed for up to 4 doses for wheezing or shortness of breath.  Marland Kitchen albuterol (VENTOLIN HFA) 108 (90 Base) MCG/ACT inhaler Inhale 2 puffs into the lungs every 6 (six) hours as needed for wheezing or shortness of breath.  . allopurinol (ZYLOPRIM) 300 MG tablet Take 300 mg by mouth daily.  . Ascorbic Acid (VITAMIN C) 1000 MG tablet Take 1,000 mg by mouth daily.  Marland Kitchen atorvastatin (LIPITOR) 40 MG tablet Take 40 mg by mouth every evening.   . Calcium Carb-Cholecalciferol (CALCIUM 600+D3 PO) Take 1 tablet by mouth daily.  . Cholecalciferol (VITAMIN D3) 50 MCG (2000 UT) TABS Take 2,000 Units by mouth daily.  . clopidogrel (PLAVIX) 75 MG tablet Take 1 tablet (75 mg total) by mouth daily.    Marland Kitchen ELIQUIS 5 MG TABS tablet TAKE 1 TABLET BY MOUTH TWICE DAILY  . Fluticasone-Umeclidin-Vilant (TRELEGY ELLIPTA) 100-62.5-25 MCG/INH AEPB Inhale 1 puff into the lungs daily.  . Fluticasone-Umeclidin-Vilant (TRELEGY ELLIPTA) 100-62.5-25 MCG/INH AEPB Inhale 1 puff into the lungs daily.  . Fluticasone-Umeclidin-Vilant (TRELEGY ELLIPTA) 100-62.5-25 MCG/INH AEPB Inhale 1 puff into the lungs daily.  Marland Kitchen glipiZIDE (GLUCOTROL XL) 2.5 MG 24 hr tablet Take 2.5 mg by mouth daily with breakfast.  . HOMEOPATHIC PRODUCTS EX Apply 1 application topically 2 (two) times daily as needed (diabetic pain.). Topricin Foot Pain Relief Cream  . HYDROcodone-acetaminophen (HYCET) 7.5-325 mg/15 ml solution Take 1 mL by mouth 2 (two) times daily as needed for pain.  Marland Kitchen ipratropium (ATROVENT) 0.03 % nasal spray Place 2 sprays into both nostrils every 12 (twelve) hours as needed for rhinitis.  Marland Kitchen metolazone (ZAROXOLYN) 2.5 MG tablet Take 1 tablet (2.5 mg total) by mouth daily.  . metoprolol succinate (TOPROL-XL) 25 MG 24 hr tablet Take 0.5 tablets (12.5  mg total) by mouth daily.  . nitroGLYCERIN (NITROSTAT) 0.4 MG SL tablet DISSOLVE 1 TABLET UNDER THE TONGUE EVERY 5 MINUTES AS NEEDED FOR CHEST PAIN  . Omega-3 Fatty Acids (FISH OIL) 1200 MG CAPS Take 1,200 mg by mouth daily.  Vladimir Faster Glycol-Propyl Glycol (SYSTANE) 0.4-0.3 % SOLN Place 1 drop into both eyes 2 (two) times daily as needed (dry/irritated eyes).  . potassium gluconate 595 (99 K) MG TABS tablet Take 595 mg by mouth every other day.  . tamsulosin (FLOMAX) 0.4 MG CAPS capsule Take 0.4 mg by mouth daily after supper.   . torsemide (DEMADEX) 10 MG tablet Take 1 tablet (10 mg total) by mouth daily.  . traZODone (DESYREL) 150 MG tablet Take 150 mg by mouth at bedtime.   . triamcinolone cream (KENALOG) 0.1 % Apply 1 application topically daily.     Allergies:   Patient has no known allergies.   Social History   Socioeconomic History  . Marital status: Widowed     Spouse name: Not on file  . Number of children: 0  . Years of education: Not on file  . Highest education level: Not on file  Occupational History  . Occupation: retired  Tobacco Use  . Smoking status: Former Smoker    Packs/day: 3.00    Years: 57.00    Pack years: 171.00    Types: Cigarettes    Start date: 93    Quit date: 06/13/2014    Years since quitting: 5.4  . Smokeless tobacco: Never Used  Substance and Sexual Activity  . Alcohol use: Not Currently  . Drug use: Never  . Sexual activity: Not on file  Other Topics Concern  . Not on file  Social History Narrative  . Not on file   Social Determinants of Health   Financial Resource Strain:   . Difficulty of Paying Living Expenses:   Food Insecurity:   . Worried About Charity fundraiser in the Last Year:   . Arboriculturist in the Last Year:   Transportation Needs:   . Film/video editor (Medical):   Marland Kitchen Lack of Transportation (Non-Medical):   Physical Activity:   . Days of Exercise per Week:   . Minutes of Exercise per Session:   Stress:   . Feeling of Stress :   Social Connections:   . Frequency of Communication with Friends and Family:   . Frequency of Social Gatherings with Friends and Family:   . Attends Religious Services:   . Active Member of Clubs or Organizations:   . Attends Archivist Meetings:   Marland Kitchen Marital Status:      Family History: The patient's family history includes Cancer in his father. He was adopted.  ROS:   Please see the history of present illness.    All other systems reviewed and are negative.  EKGs/Labs/Other Studies Reviewed:    The following studies were reviewed today: Results of the blood test done recently were discussed with the patient at length.   Recent Labs: 03/08/2019: TSH 1.080 05/11/2019: Magnesium 1.9 07/08/2019: B Natriuretic Peptide 87.7 11/14/2019: ALT 16; BUN 23; Creatinine, Ser 1.46; Hemoglobin 16.4; NT-Pro BNP 616; Platelets 119.0; Potassium 4.5;  Sodium 137  Recent Lipid Panel    Component Value Date/Time   CHOL 128 08/30/2019 0852   TRIG 145 08/30/2019 0852   HDL 37 (L) 08/30/2019 0852   CHOLHDL 3.5 08/30/2019 0852   LDLCALC 66 08/30/2019 0852    Physical Exam:  VS:  BP (!) 142/78   Pulse 70   Ht 5\' 9"  (1.753 m)   Wt 190 lb (86.2 kg)   SpO2 96%   BMI 28.06 kg/m     Wt Readings from Last 3 Encounters:  12/04/19 190 lb (86.2 kg)  11/27/19 188 lb (85.3 kg)  11/20/19 195 lb (88.5 kg)     GEN: Patient is in no acute distress HEENT: Normal NECK: No JVD; No carotid bruits LYMPHATICS: No lymphadenopathy CARDIAC: Hear sounds regular, 2/6 systolic murmur at the apex. RESPIRATORY:  Clear to auscultation without rales, wheezing or rhonchi  ABDOMEN: Soft, non-tender, non-distended MUSCULOSKELETAL:  No edema; No deformity  SKIN: Warm and dry NEUROLOGIC:  Alert and oriented x 3 PSYCHIATRIC:  Normal affect   Signed, Jenean Lindau, MD  12/04/2019 11:36 AM    [place]

## 2019-12-04 NOTE — Patient Instructions (Signed)
Medication Instructions:  Your physician has recommended you make the following change in your medication:   Keep taking Metolazone.  *If you need a refill on your cardiac medications before your next appointment, please call your pharmacy*   Lab Work: You had a BMET and BNP. If you have labs (blood work) drawn today and your tests are completely normal, you will receive your results only by: Marland Kitchen MyChart Message (if you have MyChart) OR . A paper copy in the mail If you have any lab test that is abnormal or we need to change your treatment, we will call you to review the results.   Testing/Procedures: None ordered   Follow-Up: At [facility], you and your health needs are our priority.  As part of our continuing mission to provide you with exceptional heart care, we have created designated Provider Care Teams.  These Care Teams include your primary Cardiologist (physician) and Advanced Practice Providers (APPs -  Physician Assistants and Nurse Practitioners) who all work together to provide you with the care you need, when you need it.  We recommend signing up for the patient portal called "MyChart".  Sign up information is provided on this After Visit Summary.  MyChart is used to connect with patients for Virtual Visits (Telemedicine).  Patients are able to view lab/test results, encounter notes, upcoming appointments, etc.  Non-urgent messages can be sent to your provider as well.   To learn more about what you can do with MyChart, go to [email or web address].    Your next appointment:   1 month(s)  The format for your next appointment:   In Person  Provider:   Jyl Heinz, MD   Other Instructions Have a great day.

## 2019-12-05 LAB — BASIC METABOLIC PANEL
BUN/Creatinine Ratio: 21 (ref 10–24)
BUN: 28 mg/dL — ABNORMAL HIGH (ref 8–27)
CO2: 28 mmol/L (ref 20–29)
Calcium: 10.4 mg/dL — ABNORMAL HIGH (ref 8.6–10.2)
Chloride: 96 mmol/L (ref 96–106)
Creatinine, Ser: 1.33 mg/dL — ABNORMAL HIGH (ref 0.76–1.27)
GFR calc Af Amer: 60 mL/min/{1.73_m2} (ref >59)
GFR calc non Af Amer: 52 mL/min/{1.73_m2} — ABNORMAL LOW (ref >59)
Glucose: 140 mg/dL — ABNORMAL HIGH (ref 65–99)
Potassium: 4 mmol/L (ref 3.5–5.2)
Sodium: 140 mmol/L (ref 134–144)

## 2019-12-05 LAB — BRAIN NATRIURETIC PEPTIDE: BNP: 81.4 pg/mL (ref 0.0–100.0)

## 2019-12-05 NOTE — Telephone Encounter (Signed)
I will call & cancel the appointment patient rescheduled w/ [facility].  Please place a new CT order as the one already placed has been scheduled already & will require a new order.   Thank you!

## 2019-12-05 NOTE — Telephone Encounter (Signed)
New order has been placed. Will close this encounter.  

## 2019-12-07 ENCOUNTER — Inpatient Hospital Stay: Admission: RE | Admit: 2019-12-07 | Payer: Medicare Other | Source: Ambulatory Visit

## 2019-12-07 DIAGNOSIS — J439 Emphysema, unspecified: Secondary | ICD-10-CM | POA: Diagnosis not present

## 2019-12-07 DIAGNOSIS — R590 Localized enlarged lymph nodes: Secondary | ICD-10-CM | POA: Diagnosis not present

## 2020-01-04 ENCOUNTER — Other Ambulatory Visit: Payer: Self-pay

## 2020-01-04 ENCOUNTER — Encounter: Payer: Self-pay | Admitting: Cardiology

## 2020-01-04 ENCOUNTER — Ambulatory Visit (INDEPENDENT_AMBULATORY_CARE_PROVIDER_SITE_OTHER): Payer: Medicare Other | Admitting: Cardiology

## 2020-01-04 VITALS — BP 142/82 | HR 66 | Temp 97.6°F | Ht 69.0 in | Wt 185.4 lb

## 2020-01-04 DIAGNOSIS — I4821 Permanent atrial fibrillation: Secondary | ICD-10-CM

## 2020-01-04 DIAGNOSIS — E088 Diabetes mellitus due to underlying condition with unspecified complications: Secondary | ICD-10-CM

## 2020-01-04 DIAGNOSIS — I2510 Atherosclerotic heart disease of native coronary artery without angina pectoris: Secondary | ICD-10-CM

## 2020-01-04 DIAGNOSIS — Z9861 Coronary angioplasty status: Secondary | ICD-10-CM

## 2020-01-04 DIAGNOSIS — N289 Disorder of kidney and ureter, unspecified: Secondary | ICD-10-CM

## 2020-01-04 NOTE — Patient Instructions (Signed)
Medication Instructions:  No medication changes *If you need a refill on your cardiac medications before your next appointment, please call your pharmacy*   Lab Work: Your physician recommends that you have a BMET today.  If you have labs (blood work) drawn today and your tests are completely normal, you will receive your results only by: Marland Kitchen MyChart Message (if you have MyChart) OR . A paper copy in the mail If you have any lab test that is abnormal or we need to change your treatment, we will call you to review the results.   Testing/Procedures: None ordered   Follow-Up: At [facility], you and your health needs are our priority.  As part of our continuing mission to provide you with exceptional heart care, we have created designated Provider Care Teams.  These Care Teams include your primary Cardiologist (physician) and Advanced Practice Providers (APPs -  Physician Assistants and Nurse Practitioners) who all work together to provide you with the care you need, when you need it.  We recommend signing up for the patient portal called "MyChart".  Sign up information is provided on this After Visit Summary.  MyChart is used to connect with patients for Virtual Visits (Telemedicine).  Patients are able to view lab/test results, encounter notes, upcoming appointments, etc.  Non-urgent messages can be sent to your provider as well.   To learn more about what you can do with MyChart, go to [email or web address].    Your next appointment:   2 month(s)  The format for your next appointment:   In Person  Provider:   Jyl Heinz, MD   Other Instructions NA

## 2020-01-04 NOTE — Progress Notes (Signed)
Cardiology Office Note:    Date:  01/04/2020   ID:  Juan Valdez, DOB 1946/09/08, MRN [id number]  PCP:  Ronita Hipps, MD  Cardiologist:  Jenean Lindau, MD   Referring MD: Ronita Hipps, MD    ASSESSMENT:    1. Coronary artery disease involving native coronary artery of native heart without angina pectoris   2. CAD S/P percutaneous coronary angioplasty   3. Permanent atrial fibrillation ([place])   4. Diabetes mellitus due to underlying condition with unspecified complications ([place])    PLAN:    In order of problems listed above:  1. I discussed my findings with the patient at length. 2. Coronary artery disease and congestive heart failure secondary prevention stressed with the patient.  Importance of compliance with diet medication stressed and he vocalized understanding.  He is doing a very good job checking his blood pressures and weighing him as well on a regular basis.  Since he is on multiple medications including diuretics we will do a Chem-7 today. 3. Atrial fibrillation:I discussed with the patient atrial fibrillation, disease process. Management and therapy including rate and rhythm control, anticoagulation benefits and potential risks were discussed extensively with the patient. Patient had multiple questions which were answered to patient's satisfaction. 4. Mixed dyslipidemia: Diet was emphasized and promises to stick to it. 5. Patient will be seen in follow-up appointment in 2 months or earlier if the patient has any concerns    Medication Adjustments/Labs and Tests Ordered: Current medicines are reviewed at length with the patient today.  Concerns regarding medicines are outlined above.  No orders of the defined types were placed in this encounter.  No orders of the defined types were placed in this encounter.    No chief complaint on file.    History of Present Illness:    Juan Valdez is a 74 y.o. male.  Patient has past medical history of atrial  fibrillation, essential hypertension dyslipidemia, coronary artery disease and congestive heart failure.  He is diabetes mellitus and essential hypertension and renal insufficiency.  He is a former smoker.  He does not smoke anymore.  He mentions to me that since he is taking diuretics regularly and weighing himself and pain attention to diet he feels the best he has felt in many months.  No orthopnea or PND.  At the time of my evaluation, the patient is alert awake oriented and in no distress.  Past Medical History:  Diagnosis Date  . Abdominal bloating 05/06/2019  . Abdominal distension 05/06/2019  . Abnormal nuclear cardiac imaging test 08/22/2019  . 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 Allergic rhinitis 05/29/2019  . Anemia   . Atrial fibrillation ([place]) 09/07/2018  . Bilateral recurrent inguinal hernia without obstruction or gangrene 05/06/2019  . CAD (coronary artery disease)    a. s/p prior DES to RCA b. cath in 07/2019 showing 75% Prox to mid RCA stenosis, 5% ISR along previously placed distal RCA stent, 85% Prox LCx stenosis, and 100% CTO of distal LCx with filled from L--> L collaterals. Also had a long-diffuse 65-75% stenosis along proximal to mid-LAD. Unable to fully expand balloon for PCI of RCA and lesion reduced to 65%. Future atherectotmy of RCA and LCx.   Marland Kitchen CAD S/P percutaneous coronary angioplasty 07/20/2019  . CHF (congestive heart failure) ([place]) 11/27/2019  . Colon polyp   . COPD (chronic obstructive pulmonary disease) ([place])   . COPD with acute exacerbation ([place]) 06/14/2019  . Coronary artery disease involving  native coronary artery of native heart with angina pectoris ([place]) 07/20/2019  . Diabetes ([place])   . Diabetes mellitus due to underlying condition with unspecified complications ([place]) 74/[id number]/4967  . Diverticulosis   . DOE (dyspnea on exertion) 07/13/2019  . Essential hypertension 09/07/2018  . 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 Generalized abdominal pain 05/06/2019  . GERD (gastroesophageal reflux disease)   . Gout   . HLD (hyperlipidemia)   . 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 Hx of colonic polyps 05/06/2019  . Irritable bowel syndrome with constipation 05/06/2019  . Lung cancer ([place])   . Medication management 01/04/2019  . Mixed dyslipidemia 09/07/2018  . Non-small cell cancer of right lung ([place]) 11/23/2018  . Onychomycosis due to dermatophyte 11/16/2018  . Orthopnea 01/04/2019  . Perforated bowel ([place])   . Permanent atrial fibrillation ([place]) 09/07/2018  . SBO (small bowel obstruction) ([place]) 05/08/2019  . Shortness of breath 07/08/2019    Past Surgical History:  Procedure Laterality Date  . ABDOMINAL SURGERY    . CORONARY ANGIOPLASTY WITH STENT PLACEMENT  2003  . CORONARY ATHERECTOMY N/A 08/22/2019   Procedure: CORONARY ATHERECTOMY;  Surgeon: Leonie Man, MD;  Location: [place] [facility];  Service: Cardiovascular;  Laterality: N/A;  . CORONARY BALLOON ANGIOPLASTY N/A 07/20/2019   Procedure: CORONARY BALLOON ANGIOPLASTY;  Surgeon: Leonie Man, MD;  Location: [place] [facility];  Service: Cardiovascular;  Laterality: N/A;  RCA  . CORONARY BALLOON ANGIOPLASTY N/A 08/22/2019   Procedure: CORONARY BALLOON ANGIOPLASTY;  Surgeon: Leonie Man, MD;  Location: [place] [facility];  Service: Cardiovascular;  Laterality: N/A;  . CORONARY STENT INTERVENTION  08/22/2019  . CORONARY STENT INTERVENTION N/A 08/22/2019   Procedure: CORONARY STENT INTERVENTION;  Surgeon: Leonie Man, MD;  Location: [place];  Service: Cardiovascular;  Laterality: N/A;  . LEFT HEART CATH AND CORONARY ANGIOGRAPHY N/A 07/20/2019   Procedure: LEFT HEART CATH AND CORONARY ANGIOGRAPHY;  Surgeon: Leonie Man, MD;  Location: [place] [facility];  Service: Cardiovascular;  Laterality: N/A;  . LEFT HEART CATH AND CORONARY  ANGIOGRAPHY N/A 08/22/2019   Procedure: LEFT HEART CATH AND CORONARY ANGIOGRAPHY;  Surgeon: Leonie Man, MD;  Location: [place] [facility];  Service: Cardiovascular;  Laterality: N/A;  . LUNG CANCER SURGERY    . ROTATOR CUFF REPAIR Left   . TEMPORARY PACEMAKER N/A 08/22/2019   Procedure: TEMPORARY PACEMAKER;  Surgeon: Leonie Man, MD;  Location: [place] [facility];  Service: Cardiovascular;  Laterality: N/A;  . TONSILLECTOMY     age 81    Current Medications: Current Meds  Medication Sig  . ALAWAY 0.025 % ophthalmic solution Place 1 drop into both eyes 2 (two) times daily as needed (allergy/irritated eyes.).  Marland Kitchen albuterol (PROVENTIL) (2.5 MG/3ML) 0.083% nebulizer solution Take 3 mLs (2.5 mg total) by nebulization every 6 (six) hours as needed for up to 4 doses for wheezing or shortness of breath.  Marland Kitchen albuterol (VENTOLIN HFA) 108 (90 Base) MCG/ACT inhaler Inhale 2 puffs into the lungs every 6 (six) hours as needed for wheezing or shortness of breath.  . allopurinol (ZYLOPRIM) 300 MG tablet Take 300 mg by mouth daily.  . Ascorbic Acid (VITAMIN C) 1000 MG tablet Take 1,000 mg by mouth daily.  Marland Kitchen atorvastatin (LIPITOR) 40 MG tablet Take 40 mg by mouth  every evening.   . Calcium Carb-Cholecalciferol (CALCIUM 600+D3 PO) Take 1 tablet by mouth daily.  . Cholecalciferol (VITAMIN D3) 50 MCG (2000 UT) TABS Take 2,000 Units by mouth daily.  . clopidogrel (PLAVIX) 75 MG tablet Take 1 tablet (75 mg total) by mouth daily.  Marland Kitchen ELIQUIS 5 MG TABS tablet TAKE 1 TABLET BY MOUTH TWICE DAILY  . Fluticasone-Umeclidin-Vilant (TRELEGY ELLIPTA) 100-62.5-25 MCG/INH AEPB Inhale 1 puff into the lungs daily.  Marland Kitchen glipiZIDE (GLUCOTROL XL) 2.5 MG 24 hr tablet Take 2.5 mg by mouth daily with breakfast.  . HOMEOPATHIC PRODUCTS EX Apply 1 application topically 2 (two) times daily as needed (diabetic pain.). Topricin Foot Pain Relief Cream  . HYDROcodone-acetaminophen (HYCET) 7.5-325 mg/15 ml solution Take 1 mL by mouth 2  (two) times daily as needed for pain.  Marland Kitchen ipratropium (ATROVENT) 0.03 % nasal spray Place 2 sprays into both nostrils every 12 (twelve) hours as needed for rhinitis.  Marland Kitchen metolazone (ZAROXOLYN) 2.5 MG tablet Take 1 tablet (2.5 mg total) by mouth daily.  . metoprolol succinate (TOPROL-XL) 25 MG 24 hr tablet Take 0.5 tablets (12.5 mg total) by mouth daily.  . nitroGLYCERIN (NITROSTAT) 0.4 MG SL tablet DISSOLVE 1 TABLET UNDER THE TONGUE EVERY 5 MINUTES AS NEEDED FOR CHEST PAIN  . Omega-3 Fatty Acids (FISH OIL) 1200 MG CAPS Take 1,200 mg by mouth daily.  Vladimir Faster Glycol-Propyl Glycol (SYSTANE) 0.4-0.3 % SOLN Place 1 drop into both eyes 2 (two) times daily as needed (dry/irritated eyes).  . potassium gluconate 595 (99 K) MG TABS tablet Take 595 mg by mouth every other day.  . tamsulosin (FLOMAX) 0.4 MG CAPS capsule Take 0.4 mg by mouth daily after supper.   . torsemide (DEMADEX) 10 MG tablet Take 1 tablet (10 mg total) by mouth daily.  . traZODone (DESYREL) 150 MG tablet Take 150 mg by mouth at bedtime.   . triamcinolone cream (KENALOG) 0.1 % Apply 1 application topically daily.     Allergies:   Patient has no known allergies.   Social History   Socioeconomic History  . Marital status: Widowed    Spouse name: Not on file  . Number of children: 0  . Years of education: Not on file  . Highest education level: Not on file  Occupational History  . Occupation: retired  Tobacco Use  . Smoking status: Former Smoker    Packs/day: 3.00    Years: 57.00    Pack years: 171.00    Types: Cigarettes    Start date: 86    Quit date: 06/13/2014    Years since quitting: 5.5  . Smokeless tobacco: Never Used  Substance and Sexual Activity  . Alcohol use: Not Currently  . Drug use: Never  . Sexual activity: Not on file  Other Topics Concern  . Not on file  Social History Narrative  . Not on file   Social Determinants of Health   Financial Resource Strain:   . Difficulty of Paying Living  Expenses:   Food Insecurity:   . Worried About Charity fundraiser in the Last Year:   . Arboriculturist in the Last Year:   Transportation Needs:   . Film/video editor (Medical):   Marland Kitchen Lack of Transportation (Non-Medical):   Physical Activity:   . Days of Exercise per Week:   . Minutes of Exercise per Session:   Stress:   . Feeling of Stress :   Social Connections:   . Frequency of Communication with  Friends and Family:   . Frequency of Social Gatherings with Friends and Family:   . Attends Religious Services:   . Active Member of Clubs or Organizations:   . Attends Archivist Meetings:   Marland Kitchen Marital Status:      Family History: The patient's family history includes Cancer in his father. He was adopted.  ROS:   Please see the history of present illness.    All other systems reviewed and are negative.  EKGs/Labs/Other Studies Reviewed:    The following studies were reviewed today: Leonie Man, MD (Primary)    Procedures  CORONARY BALLOON ANGIOPLASTY  LEFT HEART CATH AND CORONARY ANGIOGRAPHY  Conclusion    Prox RCA to Mid RCA lesion is 75% stenosed.  Scoring balloon angioplasty was performed using a BALLOON [place] EMERGE MR 3.0X8.  Post intervention, there is a 65% residual stenosis. Unable to fully expand balloon / deliver stent.  Prox Cx lesion is 85% stenosed- calcified.  Dist Cx - 3rd Mrg lesion is 100% stenosed.  Dist RCA Stent is 5% stenosed.  The left ventricular systolic function is normal.  The left ventricular ejection fraction is 55-65% by visual estimate.  There is no mitral valve regurgitation.  There is no aortic valve stenosis.   SUMMARY  Severe 2 Vessel CAD - focal 85% calcified pLCx & 100% CTO of dCx-OM4 (fills L-L collaterals), long-diffuse 65-75% p-m LAD segmental lesion (moderately calcified) with patent dRCA stent. ? PTCA only of p-mRCA - unable to deliver stent (will need Atherctomy)  Normal LVEF &  LVEDP   RECOMMENDATION  Will observe overnight post PTCA -- will need Atherectomy based PCI of RCA & LCx - Scheduled for 12/9 1st Case (will need updated H&P upon arrival).  Loaded with Brilinta in Cath Lab - but with Eliquis, will convert to Plavix in AM - 1 more dose Brilinta tonight, 300 mg Plavix in AM then 75 mg BID  Aggressive RF management   [name], MD      Recent Labs: 03/08/2019: TSH 1.080 05/11/2019: Magnesium 1.9 11/14/2019: ALT 16; Hemoglobin 16.4; NT-Pro BNP 616; Platelets 119.0 12/04/2019: BNP 81.4; BUN 28; Creatinine, Ser 1.33; Potassium 4.0; Sodium 140  Recent Lipid Panel    Component Value Date/Time   CHOL 128 08/30/2019 0852   TRIG 145 08/30/2019 0852   HDL 37 (L) 08/30/2019 0852   CHOLHDL 3.5 08/30/2019 0852   LDLCALC 66 08/30/2019 0852    Physical Exam:    VS:  BP (!) 142/82   Pulse 66   Temp 97.6 F (36.4 C)   Ht 5\' 9"  (1.753 m)   Wt 185 lb 6.4 oz (84.1 kg)   SpO2 94%   BMI 27.38 kg/m     Wt Readings from Last 3 Encounters:  01/04/20 185 lb 6.4 oz (84.1 kg)  12/04/19 190 lb (86.2 kg)  11/27/19 188 lb (85.3 kg)     GEN: Patient is in no acute distress HEENT: Normal NECK: No JVD; No carotid bruits LYMPHATICS: No lymphadenopathy CARDIAC: Hear sounds regular, 2/6 systolic murmur at the apex. RESPIRATORY:  Clear to auscultation without rales, wheezing or rhonchi  ABDOMEN: Soft, non-tender, non-distended MUSCULOSKELETAL:  No edema; No deformity  SKIN: Warm and dry NEUROLOGIC:  Alert and oriented x 3 PSYCHIATRIC:  Normal affect   Signed, Jenean Lindau, MD  01/04/2020 11:01 AM    [place]

## 2020-01-05 LAB — BASIC METABOLIC PANEL
BUN/Creatinine Ratio: 18 (ref 10–24)
BUN: 28 mg/dL — ABNORMAL HIGH (ref 8–27)
CO2: 31 mmol/L — ABNORMAL HIGH (ref 20–29)
Calcium: 10.2 mg/dL (ref 8.6–10.2)
Chloride: 95 mmol/L — ABNORMAL LOW (ref 96–106)
Creatinine, Ser: 1.58 mg/dL — ABNORMAL HIGH (ref 0.76–1.27)
GFR calc Af Amer: 49 mL/min/{1.73_m2} — ABNORMAL LOW (ref >59)
GFR calc non Af Amer: 42 mL/min/{1.73_m2} — ABNORMAL LOW (ref >59)
Glucose: 165 mg/dL — ABNORMAL HIGH (ref 65–99)
Potassium: 4.3 mmol/L (ref 3.5–5.2)
Sodium: 141 mmol/L (ref 134–144)

## 2020-01-07 ENCOUNTER — Other Ambulatory Visit: Payer: Self-pay

## 2020-01-07 ENCOUNTER — Other Ambulatory Visit: Payer: Self-pay | Admitting: Cardiology

## 2020-01-07 DIAGNOSIS — N289 Disorder of kidney and ureter, unspecified: Secondary | ICD-10-CM

## 2020-01-07 MED ORDER — METOLAZONE 2.5 MG PO TABS: 2.5000 mg | ORAL_TABLET | Freq: Every day | ORAL | 3 refills | Status: DC

## 2020-01-07 NOTE — Addendum Note (Signed)
Addended by: Truddie Hidden on: 01/07/2020 01:34 PM   Modules accepted: Orders

## 2020-01-07 NOTE — Telephone Encounter (Signed)
°*  STAT* If patient is at the pharmacy, call can be transferred to refill team.   1. Which medications need to be refilled? (please list name of each medication and dose if known) metolazone (ZAROXOLYN) 2.5 MG tablet  2. Which pharmacy/location (including street and city if local pharmacy) is medication to be sent to? [facility]  3. Do they need a 30 day or 90 day supply? 90 day supply

## 2020-01-07 NOTE — Telephone Encounter (Signed)
Left patient a message letting him know that his [facility].   Encouraged patient to call back with any questions or concerns.

## 2020-01-09 ENCOUNTER — Telehealth: Payer: Self-pay

## 2020-01-09 MED ORDER — CLOPIDOGREL BISULFATE 75 MG PO TABS: 75.0000 mg | ORAL_TABLET | Freq: Every day | ORAL | 3 refills | Status: DC

## 2020-01-09 NOTE — Telephone Encounter (Signed)
Per [facility]. Questioning Pt's Plavix prescription.  Please call Jon Gills [phone]  Thanks renee

## 2020-01-09 NOTE — Telephone Encounter (Signed)
Refill for plavix sent to [facility]

## 2020-01-11 DIAGNOSIS — M109 Gout, unspecified: Secondary | ICD-10-CM | POA: Diagnosis not present

## 2020-01-11 DIAGNOSIS — J449 Chronic obstructive pulmonary disease, unspecified: Secondary | ICD-10-CM | POA: Diagnosis not present

## 2020-01-11 DIAGNOSIS — E119 Type 2 diabetes mellitus without complications: Secondary | ICD-10-CM | POA: Diagnosis not present

## 2020-01-15 ENCOUNTER — Ambulatory Visit: Payer: Medicare Other | Admitting: Pulmonary Disease

## 2020-01-21 ENCOUNTER — Telehealth: Payer: Self-pay | Admitting: Emergency Medicine

## 2020-01-21 DIAGNOSIS — N289 Disorder of kidney and ureter, unspecified: Secondary | ICD-10-CM | POA: Diagnosis not present

## 2020-01-21 MED ORDER — TRELEGY ELLIPTA 100-62.5-25 MCG/INH IN AEPB: 1.0000 | INHALATION_SPRAY | Freq: Every day | RESPIRATORY_TRACT | 0 refills | Status: DC

## 2020-01-21 NOTE — Telephone Encounter (Signed)
Called and spoke with patient. Ask him what strength of medication did he need. I also asked him why was he needing samples, he states the amount for 1 month right now for him is $90 so I asked if he had been offered patient assistance. He said no.   1 month sample supplied to patient with paperwork for patient assistance. Patient instructed to fill out and bring back with proof of income.  Left up front for patient. He will be in on 01/22/20 to pick up  Patient voiced understanding  Nothing further needed at this time.

## 2020-01-22 LAB — BASIC METABOLIC PANEL
BUN/Creatinine Ratio: 16 (ref 10–24)
BUN: 23 mg/dL (ref 8–27)
CO2: 29 mmol/L (ref 20–29)
Calcium: 10.2 mg/dL (ref 8.6–10.2)
Chloride: 96 mmol/L (ref 96–106)
Creatinine, Ser: 1.4 mg/dL — ABNORMAL HIGH (ref 0.76–1.27)
GFR calc Af Amer: 56 mL/min/{1.73_m2} — ABNORMAL LOW (ref >59)
GFR calc non Af Amer: 49 mL/min/{1.73_m2} — ABNORMAL LOW (ref >59)
Glucose: 136 mg/dL — ABNORMAL HIGH (ref 65–99)
Potassium: 4.1 mmol/L (ref 3.5–5.2)
Sodium: 142 mmol/L (ref 134–144)

## 2020-01-23 ENCOUNTER — Telehealth: Payer: Self-pay | Admitting: Adult Health

## 2020-01-23 ENCOUNTER — Telehealth: Payer: Self-pay | Admitting: Cardiology

## 2020-01-23 NOTE — Telephone Encounter (Signed)
New message   Patient is calling for lab results. Please call. 

## 2020-01-23 NOTE — Telephone Encounter (Signed)
Patient came to office to pick up samples. He states the medication is $90 each month. This is not affordable to him. I also provided him with patient assistance forms. Patient wanted to know if there was any other medication he could be switched to that would be just as beneficial but more cost friendly. Patient asked me to ask Tammy.  TP please advise.

## 2020-01-23 NOTE — Telephone Encounter (Signed)
Results reviewed with pt as per Dr. Revankar's note.  Pt verbalized understanding and had no additional questions.   

## 2020-01-28 NOTE — Telephone Encounter (Signed)
Left message for patient to call back  

## 2020-01-28 NOTE — Telephone Encounter (Signed)
Will need his formulary so we can see what is covered  Could also refer to pharm team to see if qualifies for any assistance or better options

## 2020-02-07 NOTE — Telephone Encounter (Signed)
Called and spoke with pt's wife letting her know the info stated by TP and she verbalized understanding. I stated to them that we would check with our pharmacy team to see if they can let us know if pt would qualify for assistance or if there were better options with inhalers and she verbalized understanding.  Rachael/Amber, can you please advise.

## 2020-02-07 NOTE — Telephone Encounter (Signed)
Called and spoke with pt letting him know the info stated by Rachael. Pt stated that he has sent in the patient assistance application for Trelegy and is now waiting to see if he has been approved.  Pt wanted to know if he should stay on Trelegy or if he should switch to [employer]. Dr. Lamonte Sakai, please advise.

## 2020-02-07 NOTE — Telephone Encounter (Signed)
Ran test claim for 1 month supply:  Trelegy is preferred- copay is $96.57  Juan Valdez- copay is $94.73  Patient is in the coverage gap.  Patient can apply for patient assistance- must meet income guidelines. Trelegy- [employer]  (household must have spent $600 OOP on prescriptions) or Breztri- [place] (program has waived OOP spending requirements for 2021)

## 2020-02-07 NOTE — Telephone Encounter (Signed)
Pt returning call. [phone]

## 2020-02-11 DIAGNOSIS — E119 Type 2 diabetes mellitus without complications: Secondary | ICD-10-CM | POA: Diagnosis not present

## 2020-02-11 DIAGNOSIS — I4891 Unspecified atrial fibrillation: Secondary | ICD-10-CM | POA: Diagnosis not present

## 2020-02-11 DIAGNOSIS — D649 Anemia, unspecified: Secondary | ICD-10-CM | POA: Diagnosis not present

## 2020-02-12 DIAGNOSIS — D509 Iron deficiency anemia, unspecified: Secondary | ICD-10-CM | POA: Diagnosis not present

## 2020-02-12 NOTE — Telephone Encounter (Signed)
What is the status of this? Did he get approved for financial assistance for the trelegy?

## 2020-02-13 NOTE — Telephone Encounter (Signed)
Called [employer] PAP option 6, option 2   The lady on the phone states they do not have a record for this patient. This could mean they have not received paperwork. She suggested to call the patient and see if they have a copy and could fax it back. Representative on phone has made an account for the patient , but just to call back in a few days to see if there is an update on the approval for this medication.   ATC patient to let know information, unable to reach , left message to call office back.  Will route to Dr. Lamonte Sakai as Juluis Rainier

## 2020-02-22 ENCOUNTER — Other Ambulatory Visit: Payer: Self-pay

## 2020-02-22 ENCOUNTER — Telehealth: Payer: Self-pay | Admitting: Cardiology

## 2020-02-22 NOTE — Telephone Encounter (Signed)
New Message  Patient has discontinued the toresmide on 11/20/19. Please assist with removing out of patients current medication list.

## 2020-02-28 DIAGNOSIS — R609 Edema, unspecified: Secondary | ICD-10-CM | POA: Diagnosis not present

## 2020-02-29 ENCOUNTER — Telehealth: Payer: Self-pay | Admitting: Cardiology

## 2020-02-29 NOTE — Telephone Encounter (Signed)
Pt c/o Shortness Of Breath: STAT if SOB developed within the last 24 hours or pt is noticeably SOB on the phone  1. Are you currently SOB (can you hear that pt is SOB on the phone)? Yes  2. How long have you been experiencing SOB? 2 weeks  3. Are you SOB when sitting or when up moving around? Both  4. Are you currently experiencing any other symptoms? No

## 2020-02-29 NOTE — Telephone Encounter (Signed)
I think he should be seen in the office I cannot see him today.  I am covering the hospital in the office in DOD.  You can offer an appointment next week if he does not think y that he can wait I would send him to the emergency room.

## 2020-02-29 NOTE — Telephone Encounter (Signed)
Pt has an appt on 6/22 to see Dr. Geraldo Pitter. Pt advised to go to the  ED if sx worsen over the weekend. Pt verbalized understanding and had no additional questions.

## 2020-02-29 NOTE — Telephone Encounter (Signed)
Spoke to patient just now and he let me know that he has been experiencing SOB for the past two weeks. He states that he is concerned that he may need to have another cardiac cath. They were told by the Dr. Parks Ranger did his previous cath that he would probably need to have another one done this summer per the wife.The patients wife states that he has been having trouble going to sleep and staying asleep due to the SOB. He has been taking metolazone 2.5 mg by mouth daily and states that he has been taking this as prescribed. Denies any other symptoms. No chest pain, dizziness, or lightheadedness. He states that he has been using his inhalers/nebulizers and these help but only for a short period of time.   They are unable to take any vital signs for me at this time.   I will route this to DOD to get further recommendations.

## 2020-03-04 ENCOUNTER — Ambulatory Visit (INDEPENDENT_AMBULATORY_CARE_PROVIDER_SITE_OTHER): Payer: Medicare Other | Admitting: Cardiology

## 2020-03-04 ENCOUNTER — Encounter: Payer: Self-pay | Admitting: Cardiology

## 2020-03-04 ENCOUNTER — Other Ambulatory Visit: Payer: Self-pay

## 2020-03-04 VITALS — BP 140/74 | HR 68 | Ht 69.0 in | Wt 189.0 lb

## 2020-03-04 DIAGNOSIS — I10 Essential (primary) hypertension: Secondary | ICD-10-CM | POA: Diagnosis not present

## 2020-03-04 DIAGNOSIS — E782 Mixed hyperlipidemia: Secondary | ICD-10-CM | POA: Diagnosis not present

## 2020-03-04 DIAGNOSIS — Z87891 Personal history of nicotine dependence: Secondary | ICD-10-CM

## 2020-03-04 DIAGNOSIS — I2510 Atherosclerotic heart disease of native coronary artery without angina pectoris: Secondary | ICD-10-CM | POA: Diagnosis not present

## 2020-03-04 DIAGNOSIS — I4821 Permanent atrial fibrillation: Secondary | ICD-10-CM

## 2020-03-04 DIAGNOSIS — E088 Diabetes mellitus due to underlying condition with unspecified complications: Secondary | ICD-10-CM | POA: Diagnosis not present

## 2020-03-04 NOTE — Patient Instructions (Signed)
Medication Instructions:  No medication changes. *If you need a refill on your cardiac medications before your next appointment, please call your pharmacy*   Lab Work: Your physician recommends that you have a BMET today in the office.  If you have labs (blood work) drawn today and your tests are completely normal, you will receive your results only by: Marland Kitchen MyChart Message (if you have MyChart) OR . A paper copy in the mail If you have any lab test that is abnormal or we need to change your treatment, we will call you to review the results.   Testing/Procedures: None ordered   Follow-Up: At [facility], you and your health needs are our priority.  As part of our continuing mission to provide you with exceptional heart care, we have created designated Provider Care Teams.  These Care Teams include your primary Cardiologist (physician) and Advanced Practice Providers (APPs -  Physician Assistants and Nurse Practitioners) who all work together to provide you with the care you need, when you need it.  We recommend signing up for the patient portal called "MyChart".  Sign up information is provided on this After Visit Summary.  MyChart is used to connect with patients for Virtual Visits (Telemedicine).  Patients are able to view lab/test results, encounter notes, upcoming appointments, etc.  Non-urgent messages can be sent to your provider as well.   To learn more about what you can do with MyChart, go to [email or web address].    Your next appointment:   2 month(s)  The format for your next appointment:   In Person  Provider:   Jyl Heinz, MD   Other Instructions NA

## 2020-03-04 NOTE — Addendum Note (Signed)
Addended by: Truddie Hidden on: 03/04/2020 01:43 PM   Modules accepted: Orders

## 2020-03-04 NOTE — Progress Notes (Signed)
Cardiology Office Note:    Date:  03/04/2020   ID:  Juan Valdez, DOB [birth year], MRN [id number]  PCP:  Juan Hipps, MD  Cardiologist:  Juan Lindau, MD   Referring MD: Juan Hipps, MD    ASSESSMENT:    1. Coronary artery disease involving native coronary artery of native heart without angina pectoris   2. Essential hypertension   3. Permanent atrial fibrillation ([place])   4. Mixed dyslipidemia   5. Former smoker   6. Diabetes mellitus due to underlying condition with unspecified complications ([place])    PLAN:    In order of problems listed above:  1. Coronary artery disease: Secondary prevention stressed with the patient.  Importance of compliance with diet medication stressed and he vocalized understanding.  He is tolerating metolazone well and it keeps his fluid status in control and he is well ambulatory.  He walks 15 to 20 minutes a day on a regular basis and is building up his effort tolerance. 2. Permanent atrial fibrillation:I discussed with the patient atrial fibrillation, disease process. Management and therapy including rate and rhythm control, anticoagulation benefits and potential risks were discussed extensively with the patient. Patient had multiple questions which were answered to patient's satisfaction. 3. Essential hypertension: Blood pressure is stable 4. Mixed dyslipidemia: I discussed this with him at length and his lipids are followed by his primary care physician. 5. Patient will be seen in follow-up appointment in 2 months or earlier if the patient has any concerns    Medication Adjustments/Labs and Tests Ordered: Current medicines are reviewed at length with the patient today.  Concerns regarding medicines are outlined above.  No orders of the defined types were placed in this encounter.  No orders of the defined types were placed in this encounter.    No chief complaint on file.    History of Present Illness:    Juan Valdez is a  74 y.o. male.  Patient has past medical history of coronary artery disease, essential hypertension dyslipidemia diabetes mellitus and renal insufficiency.  He denies any problems at this time and takes care of activities of daily living.  He has done well with metolazone and this is keeping his fluid out.  He is very meticulous with salt intake and weighing himself on a regular basis.  No chest pain orthopnea or PND.  At the time of my evaluation, the patient is alert awake oriented and in no distress.  He has atrial fibrillation.  Past Medical History:  Diagnosis Date  . Abdominal bloating 05/06/2019  . Abdominal distension 05/06/2019  . Abnormal nuclear cardiac imaging test 08/22/2019  . 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 Allergic rhinitis 05/29/2019  . Anemia   . Atrial fibrillation ([place]) 09/07/2018  . Bilateral recurrent inguinal hernia without obstruction or gangrene 05/06/2019  . CAD (coronary artery disease)    a. s/p prior DES to RCA b. cath in 07/2019 showing 75% Prox to mid RCA stenosis, 5% ISR along previously placed distal RCA stent, 85% Prox LCx stenosis, and 100% CTO of distal LCx with filled from L--> L collaterals. Also had a long-diffuse 65-75% stenosis along proximal to mid-LAD. Unable to fully expand balloon for PCI of RCA and lesion reduced to 65%. Future atherectotmy of RCA and LCx.   Marland Kitchen CAD S/P percutaneous coronary angioplasty 07/20/2019  . CHF (congestive heart failure) ([place]) 11/27/2019  . Colon polyp   . COPD (chronic obstructive pulmonary disease) ([place])   . COPD with  acute exacerbation ([place]) 06/14/2019  . Coronary artery disease involving native coronary artery of native heart with angina pectoris ([place]) 07/20/2019  . Diabetes ([place])   . Diabetes mellitus due to underlying condition with unspecified complications ([place]) 47/82/9562  . Diverticulosis   . DOE (dyspnea on exertion) 07/13/2019  . Essential hypertension 09/07/2018  . 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 Generalized abdominal pain 05/06/2019  . GERD (gastroesophageal reflux disease)   . Gout   . HLD (hyperlipidemia)   . 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 Hx of colonic polyps 05/06/2019  . Irritable bowel syndrome with constipation 05/06/2019  . Lung cancer ([place])   . Medication management 01/04/2019  . Mixed dyslipidemia 09/07/2018  . Non-small cell cancer of right lung ([place]) 11/23/2018  . Onychomycosis due to dermatophyte 11/16/2018  . Orthopnea 01/04/2019  . Perforated bowel ([place])   . Permanent atrial fibrillation ([place]) 09/07/2018  . SBO (small bowel obstruction) ([place]) 05/08/2019  . Shortness of breath 07/08/2019    Past Surgical History:  Procedure Laterality Date  . ABDOMINAL SURGERY    . CORONARY ANGIOPLASTY WITH STENT PLACEMENT  2003  . CORONARY ATHERECTOMY N/A 08/22/2019   Procedure: CORONARY ATHERECTOMY;  Surgeon: Leonie Man, MD;  Location: [place] [facility];  Service: Cardiovascular;  Laterality: N/A;  . CORONARY BALLOON ANGIOPLASTY N/A 07/20/2019   Procedure: CORONARY BALLOON ANGIOPLASTY;  Surgeon: Leonie Man, MD;  Location: [place];  Service: Cardiovascular;  Laterality: N/A;  RCA  . CORONARY BALLOON ANGIOPLASTY N/A 08/22/2019   Procedure: CORONARY BALLOON ANGIOPLASTY;  Surgeon: Leonie Man, MD;  Location: [place];  Service: Cardiovascular;  Laterality: N/A;  . CORONARY STENT INTERVENTION  08/22/2019  . CORONARY STENT INTERVENTION N/A 08/22/2019   Procedure: CORONARY STENT INTERVENTION;  Surgeon: Leonie Man, MD;  Location: [facility];  Service: Cardiovascular;  Laterality: N/A;  . LEFT HEART CATH AND CORONARY ANGIOGRAPHY N/A 07/20/2019   Procedure: LEFT HEART CATH AND CORONARY ANGIOGRAPHY;  Surgeon: Leonie Man, MD;  Location: [facility];  Service: Cardiovascular;  Laterality:  N/A;  . LEFT HEART CATH AND CORONARY ANGIOGRAPHY N/A 08/22/2019   Procedure: LEFT HEART CATH AND CORONARY ANGIOGRAPHY;  Surgeon: Leonie Man, MD;  Location: [place] [facility];  Service: Cardiovascular;  Laterality: N/A;  . LUNG CANCER SURGERY    . ROTATOR CUFF REPAIR Left   . TEMPORARY PACEMAKER N/A 08/22/2019   Procedure: TEMPORARY PACEMAKER;  Surgeon: Leonie Man, MD;  Location: [place] [facility];  Service: Cardiovascular;  Laterality: N/A;  . TONSILLECTOMY     age 8    Current Medications: Current Meds  Medication Sig  . ALAWAY 0.025 % ophthalmic solution Place 1 drop into both eyes 2 (two) times daily as needed (allergy/irritated eyes.).  Marland Kitchen albuterol (PROVENTIL) (2.5 MG/3ML) 0.083% nebulizer solution Take 3 mLs (2.5 mg total) by nebulization every 6 (six) hours as needed for up to 4 doses for wheezing or shortness of breath.  Marland Kitchen albuterol (VENTOLIN HFA) 108 (90 Base) MCG/ACT inhaler Inhale 2 puffs into the lungs every 6 (six) hours as needed for wheezing or shortness of breath.  . allopurinol (ZYLOPRIM) 300 MG tablet Take 300 mg by mouth daily.  . Ascorbic Acid (VITAMIN C) 1000 MG tablet Take 1,000 mg by mouth daily.  Marland Kitchen  atorvastatin (LIPITOR) 40 MG tablet Take 40 mg by mouth every evening.   . Calcium Carb-Cholecalciferol (CALCIUM 600+D3 PO) Take 1 tablet by mouth daily.  . Cholecalciferol (VITAMIN D3) 50 MCG (2000 UT) TABS Take 2,000 Units by mouth daily.  . clopidogrel (PLAVIX) 75 MG tablet Take 1 tablet (75 mg total) by mouth daily.  Marland Kitchen ELIQUIS 5 MG TABS tablet TAKE 1 TABLET BY MOUTH TWICE DAILY  . Fluticasone-Umeclidin-Vilant (TRELEGY ELLIPTA) 100-62.5-25 MCG/INH AEPB Inhale 1 puff into the lungs daily.  Marland Kitchen glipiZIDE (GLUCOTROL XL) 2.5 MG 24 hr tablet Take 2.5 mg by mouth daily with breakfast.  . HOMEOPATHIC PRODUCTS EX Apply 1 application topically 2 (two) times daily as needed (diabetic pain.). Topricin Foot Pain Relief Cream  . HYDROcodone-acetaminophen (HYCET) 7.5-325  mg/15 ml solution Take 1 mL by mouth 2 (two) times daily as needed for pain.  Marland Kitchen ipratropium (ATROVENT) 0.03 % nasal spray Place 2 sprays into both nostrils every 12 (twelve) hours as needed for rhinitis.  Marland Kitchen metolazone (ZAROXOLYN) 2.5 MG tablet Take 1 tablet (2.5 mg total) by mouth daily.  . metoprolol succinate (TOPROL-XL) 25 MG 24 hr tablet Take 0.5 tablets (12.5 mg total) by mouth daily.  . nitroGLYCERIN (NITROSTAT) 0.4 MG SL tablet DISSOLVE 1 TABLET UNDER THE TONGUE EVERY 5 MINUTES AS NEEDED FOR CHEST PAIN  . Omega-3 Fatty Acids (FISH OIL) 1200 MG CAPS Take 1,200 mg by mouth daily.  Vladimir Faster Glycol-Propyl Glycol (SYSTANE) 0.4-0.3 % SOLN Place 1 drop into both eyes 2 (two) times daily as needed (dry/irritated eyes).  . potassium gluconate 595 (99 K) MG TABS tablet Take 595 mg by mouth every other day.  . tamsulosin (FLOMAX) 0.4 MG CAPS capsule Take 0.4 mg by mouth daily after supper.   . traZODone (DESYREL) 150 MG tablet Take 150 mg by mouth at bedtime.   . triamcinolone cream (KENALOG) 0.1 % Apply 1 application topically daily.     Allergies:   Patient has no known allergies.   Social History   Socioeconomic History  . Marital status: Widowed    Spouse name: Not on file  . Number of children: 0  . Years of education: Not on file  . Highest education level: Not on file  Occupational History  . Occupation: retired  Tobacco Use  . Smoking status: Former Smoker    Packs/day: 3.00    Years: 57.00    Pack years: 171.00    Types: Cigarettes    Start date: 46    Quit date: 06/13/2014    Years since quitting: 5.7  . Smokeless tobacco: Never Used  Vaping Use  . Vaping Use: Never used  Substance and Sexual Activity  . Alcohol use: Not Currently  . Drug use: Never  . Sexual activity: Not on file  Other Topics Concern  . Not on file  Social History Narrative  . Not on file   Social Determinants of Health   Financial Resource Strain:   . Difficulty of Paying Living Expenses:    Food Insecurity:   . Worried About Charity fundraiser in the Last Year:   . Arboriculturist in the Last Year:   Transportation Needs:   . Film/video editor (Medical):   Marland Kitchen Lack of Transportation (Non-Medical):   Physical Activity:   . Days of Exercise per Week:   . Minutes of Exercise per Session:   Stress:   . Feeling of Stress :   Social Connections:   . Frequency  of Communication with Friends and Family:   . Frequency of Social Gatherings with Friends and Family:   . Attends Religious Services:   . Active Member of Clubs or Organizations:   . Attends Archivist Meetings:   Marland Kitchen Marital Status:      Family History: The patient's family history includes Cancer in his father. He was adopted.  ROS:   Please see the history of present illness.    All other systems reviewed and are negative.  EKGs/Labs/Other Studies Reviewed:    The following studies were reviewed today: I discussed my findings with the patient at length   Recent Labs: 03/08/2019: TSH 1.080 05/11/2019: Magnesium 1.9 11/14/2019: ALT 16; Hemoglobin 16.4; NT-Pro BNP 616; Platelets 119.0 12/04/2019: BNP 81.4 01/21/2020: BUN 23; Creatinine, Ser 1.40; Potassium 4.1; Sodium 142  Recent Lipid Panel    Component Value Date/Time   CHOL 128 08/30/2019 0852   TRIG 145 08/30/2019 0852   HDL 37 (L) 08/30/2019 0852   CHOLHDL 3.5 08/30/2019 0852   LDLCALC 66 08/30/2019 0852    Physical Exam:    VS:  BP 140/74   Pulse 68   Ht 5\' 9"  (1.753 m)   Wt 189 lb (85.7 kg)   SpO2 96%   BMI 27.91 kg/m     Wt Readings from Last 3 Encounters:  03/04/20 189 lb (85.7 kg)  01/04/20 185 lb 6.4 oz (84.1 kg)  12/04/19 190 lb (86.2 kg)     GEN: Patient is in no acute distress HEENT: Normal NECK: No JVD; No carotid bruits LYMPHATICS: No lymphadenopathy CARDIAC: Hear sounds regular, 2/6 systolic murmur at the apex. RESPIRATORY:  Clear to auscultation without rales, wheezing or rhonchi  ABDOMEN: Soft, non-tender,  non-distended MUSCULOSKELETAL:  No edema; No deformity  SKIN: Warm and dry NEUROLOGIC:  Alert and oriented x 3 PSYCHIATRIC:  Normal affect   Signed, Juan Lindau, MD  03/04/2020 1:23 PM    [facility]

## 2020-03-05 ENCOUNTER — Telehealth: Payer: Self-pay | Admitting: Emergency Medicine

## 2020-03-05 LAB — BASIC METABOLIC PANEL
BUN/Creatinine Ratio: 21 (ref 10–24)
BUN: 26 mg/dL (ref 8–27)
CO2: 28 mmol/L (ref 20–29)
Calcium: 10.2 mg/dL (ref 8.6–10.2)
Chloride: 95 mmol/L — ABNORMAL LOW (ref 96–106)
Creatinine, Ser: 1.24 mg/dL (ref 0.76–1.27)
GFR calc Af Amer: 65 mL/min/{1.73_m2} (ref >59)
GFR calc non Af Amer: 57 mL/min/{1.73_m2} — ABNORMAL LOW (ref >59)
Glucose: 143 mg/dL — ABNORMAL HIGH (ref 65–99)
Potassium: 4.2 mmol/L (ref 3.5–5.2)
Sodium: 139 mmol/L (ref 134–144)

## 2020-03-05 NOTE — Telephone Encounter (Signed)
Albuterol neb sol which pt is having to use at least twice daily sometimes three times daily. Pt has had to use the albuterol rescue inhaler at least two or three times daily.  Asked Vicente Males if pt has been using the Trelegy Ellipta inhaler and she states that he has been using it every morning.  Stated to Vicente Males to make sure pt keeps taking all meds as prescribed and to keep appt with TP. Also scheduled pt an appt with RB 1 month after that visit for f/u. Nothing further needed.

## 2020-03-05 NOTE — Telephone Encounter (Signed)
Attempted to return patient's call x1.  VM left requesting a return call.

## 2020-03-05 NOTE — Telephone Encounter (Signed)
As I review the chart, It isn't clear to me what he is taking - he was trying to get trelegy but there was an Buyer, retail.  We need to find out which medicines he is taking, on what schedule.  I may be able to make adjustments before he sees [place] in office.  He needs to follow-up in the office as planned in July

## 2020-03-05 NOTE — Telephone Encounter (Signed)
Pt returning a phone call. Pt can be reached at [phone].

## 2020-03-05 NOTE — Telephone Encounter (Signed)
Spoke with the patient's wife, Juan Valdez, who is listed on the [employer].  He went to see the cardiologist on 03/04/20 for sob and was told to f/u with Dr. Lamonte Sakai.  He has an appointment on July 16th with one of the APPs.  He has trouble falling asleep because of the sob.  Takes the nebulizer and has been having to sleep in the recliner because of the sob.  He falls asleep for no reason, yesterday was the worst, he went to sleep after lunch, sleep until dinner, stayed up for about an hour and then went back to sleep at bedtime.  He gets up tired.  His O2 is 92% on RA.  At the doctor's office it was 96%.  Denies any cough or fever. Dr. Lamonte Sakai please advise.

## 2020-03-06 DIAGNOSIS — M79674 Pain in right toe(s): Secondary | ICD-10-CM | POA: Diagnosis not present

## 2020-03-06 DIAGNOSIS — B351 Tinea unguium: Secondary | ICD-10-CM | POA: Diagnosis not present

## 2020-03-06 DIAGNOSIS — E119 Type 2 diabetes mellitus without complications: Secondary | ICD-10-CM | POA: Diagnosis not present

## 2020-03-06 DIAGNOSIS — M79675 Pain in left toe(s): Secondary | ICD-10-CM | POA: Diagnosis not present

## 2020-03-12 DIAGNOSIS — D649 Anemia, unspecified: Secondary | ICD-10-CM | POA: Diagnosis not present

## 2020-03-12 DIAGNOSIS — E119 Type 2 diabetes mellitus without complications: Secondary | ICD-10-CM | POA: Diagnosis not present

## 2020-03-12 DIAGNOSIS — I4891 Unspecified atrial fibrillation: Secondary | ICD-10-CM | POA: Diagnosis not present

## 2020-03-18 ENCOUNTER — Ambulatory Visit: Payer: Medicare Other | Admitting: Cardiology

## 2020-03-28 ENCOUNTER — Encounter: Payer: Self-pay | Admitting: Adult Health

## 2020-03-28 ENCOUNTER — Ambulatory Visit (INDEPENDENT_AMBULATORY_CARE_PROVIDER_SITE_OTHER): Payer: Medicare Other | Admitting: Adult Health

## 2020-03-28 ENCOUNTER — Other Ambulatory Visit: Payer: Self-pay

## 2020-03-28 DIAGNOSIS — C3491 Malignant neoplasm of unspecified part of right bronchus or lung: Secondary | ICD-10-CM

## 2020-03-28 DIAGNOSIS — I5022 Chronic systolic (congestive) heart failure: Secondary | ICD-10-CM

## 2020-03-28 DIAGNOSIS — J431 Panlobular emphysema: Secondary | ICD-10-CM

## 2020-03-28 MED ORDER — TRELEGY ELLIPTA 100-62.5-25 MCG/INH IN AEPB
100.0000 mg | INHALATION_SPRAY | Freq: Every day | RESPIRATORY_TRACT | 0 refills | Status: DC
Start: 2020-03-28 — End: 2020-06-17

## 2020-03-28 NOTE — Assessment & Plan Note (Signed)
Moderate COPD under good control on present regimen with Trelegy daily.  Activity encouraged.  Plan  Patient Instructions  Continue on TRELEGY 1 puff daily, rinse after use.  Albuterol Inhaler or Neb As needed  Wheezing /shortness of breath .  Activity as tolerated.  Set up CT chest for October 2020  Order for new neb machine .  Follow up with Dr. Lamonte Sakai  In 4 months and As needed

## 2020-03-28 NOTE — Progress Notes (Signed)
@Patient  ID: Juan Valdez, male    DOB: 09/21/1945, 74 y.o.   MRN: [id number]  Chief Complaint  Patient presents with  . Follow-up    COPD     Referring provider: Ronita Hipps, MD  HPI: 74 yo male former smoker (100-pk/yr) seen March 2020 to establish for COPD  From [place]. Moved 2020 .  Medical history significant for CAD, A Fib , DM , CHF  History of NSCLC s/p resection 2015- RLL Lobectomy (no XRT or Chemo)   TEST/EVENTS :  10/18/2018 Echo: EF of 45-50%, Normal RV systolic function, Severe dilation of LA,. Mildly dilated RA, MV regurge and moderate thickening, Mild thickening of a tricuspic Aortic valve with mild thickening.  Left heart cath November 2020 severe two-vessel coronary artery disease status post PTCA Status post drug-eluting stent August 22, 2019,   CT chest October 2020 - for PE, ascending thoracic aorta measures 4 x 4 cm.,  Mildly enlarged precarinal and right hilar lymph nodes questionable etiology  Has had both of his Covid vaccines.  11/2019 Pulmonary function testing was done today and shows moderate COPD with an FEV1 at 61%, ratio 64, FVC 69%, significant bronchodilator response, significant mid flow obstruction and reversibility.  DLCO 83%.  03/28/2020 Follow up : COPD  Patient returns for 4 month follow up . Has moderate COPD maintained on Trelegy daily.  Patient says overall breathing has been at baseline.  He denies any flare of cough or wheezing.  Says he remains active he walks every day for at least 15 minutes on a flat surface.  Patient is going on a trip to [place] for a wedding next month.  Covid vaccines are up-to-date. Patient is followed by cardiology for congestive heart failure coronary disease and A. fib.  He remains on Eliquis and Plavix.  He is on daily Zaroxolyn 2.5 mg daily.  Patient denies any increased leg swelling or orthopnea.  Patient has a history of non-small cell lung carcinoma with previous  resection of right lower lobectomy.  He has had no radiation of chemo.  This was done in 2015 in [place].  Patient has yearly surveillance CTs.  We discussed his upcoming CT in October.  No Known Allergies  Immunization History  Administered Date(s) Administered  . Influenza, High Dose Seasonal PF 05/15/2019  . Influenza-Unspecified 07/14/2018  . PFIZER SARS-COV-2 Vaccination 09/24/2019, 10/15/2019    Past Medical History:  Diagnosis Date  . Abdominal bloating 05/06/2019  . Abdominal distension 05/06/2019  . Abnormal nuclear cardiac imaging test 08/22/2019  . 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 Allergic rhinitis 05/29/2019  . Anemia   . Atrial fibrillation ([place]) 09/07/2018  . Bilateral recurrent inguinal hernia without obstruction or gangrene 05/06/2019  . CAD (coronary artery disease)    a. s/p prior DES to RCA b. cath in 07/2019 showing 75% Prox to mid RCA stenosis, 5% ISR along previously placed distal RCA stent, 85% Prox LCx stenosis, and 100% CTO of distal LCx with filled from L--> L collaterals. Also had a long-diffuse 65-75% stenosis along proximal to mid-LAD. Unable to fully expand balloon for PCI of RCA and lesion reduced to 65%. Future atherectotmy of RCA and LCx.   Marland Kitchen CAD S/P percutaneous coronary angioplasty 07/20/2019  . CHF (congestive heart failure) ([place]) 11/27/2019  . Colon polyp   . COPD (chronic obstructive pulmonary disease) ([place])   . COPD with acute exacerbation ([place]) 06/14/2019  . Coronary artery disease involving native coronary artery of native  heart with angina pectoris ([place]) 07/20/2019  . Diabetes ([place])   . Diabetes mellitus due to underlying condition with unspecified complications ([place]) 50/93/2671  . Diverticulosis   . DOE (dyspnea on exertion) 07/13/2019  . Essential hypertension 09/07/2018  . Former smoker 03/14/2019   Former smoker Quit 2015 171-pack-year smoking history History of non-small cell cancer of right lung status post right lower lobectomy in  2015  . Generalized abdominal pain 05/06/2019  . GERD (gastroesophageal reflux disease)   . Gout   . HLD (hyperlipidemia)   . 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 Hx of colonic polyps 05/06/2019  . Irritable bowel syndrome with constipation 05/06/2019  . Lung cancer ([place])   . Medication management 01/04/2019  . Mixed dyslipidemia 09/07/2018  . Non-small cell cancer of right lung ([place]) 11/23/2018  . Onychomycosis due to dermatophyte 11/16/2018  . Orthopnea 01/04/2019  . Perforated bowel ([place])   . Permanent atrial fibrillation ([place]) 09/07/2018  . SBO (small bowel obstruction) ([place]) 05/08/2019  . Shortness of breath 07/08/2019    Tobacco History: Social History   Tobacco Use  Smoking Status Former Smoker  . Packs/day: 3.00  . Years: 57.00  . Pack years: 171.00  . Types: Cigarettes  . Start date: 67  . Quit date: 06/13/2014  . Years since quitting: 5.7  Smokeless Tobacco Never Used   Counseling given: Not Answered   Outpatient Medications Prior to Visit  Medication Sig Dispense Refill  . ALAWAY 0.025 % ophthalmic solution Place 1 drop into both eyes 2 (two) times daily as needed (allergy/irritated eyes.).    Marland Kitchen albuterol (PROVENTIL) (2.5 MG/3ML) 0.083% nebulizer solution Take 3 mLs (2.5 mg total) by nebulization every 6 (six) hours as needed for up to 4 doses for wheezing or shortness of breath. 1080 mL 0  . albuterol (VENTOLIN HFA) 108 (90 Base) MCG/ACT inhaler Inhale 2 puffs into the lungs every 6 (six) hours as needed for wheezing or shortness of breath. 24 g 1  . allopurinol (ZYLOPRIM) 300 MG tablet Take 300 mg by mouth daily.    . Ascorbic Acid (VITAMIN C) 1000 MG tablet Take 1,000 mg by mouth daily.    Marland Kitchen atorvastatin (LIPITOR) 40 MG tablet Take 40 mg by mouth every evening.     . Calcium Carb-Cholecalciferol (CALCIUM 600+D3 PO) Take 1 tablet by mouth daily.    . Cholecalciferol (VITAMIN D3) 50 MCG (2000 UT) TABS Take 2,000 Units by mouth daily.    . clopidogrel  (PLAVIX) 75 MG tablet Take 1 tablet (75 mg total) by mouth daily. 90 tablet 3  . ELIQUIS 5 MG TABS tablet TAKE 1 TABLET BY MOUTH TWICE DAILY 180 tablet 1  . Fluticasone-Umeclidin-Vilant (TRELEGY ELLIPTA) 100-62.5-25 MCG/INH AEPB Inhale 1 puff into the lungs daily. 1 each 0  . glipiZIDE (GLUCOTROL XL) 2.5 MG 24 hr tablet Take 2.5 mg by mouth daily with breakfast.    . HOMEOPATHIC PRODUCTS EX Apply 1 application topically 2 (two) times daily as needed (diabetic pain.). Topricin Foot Pain Relief Cream    . HYDROcodone-acetaminophen (HYCET) 7.5-325 mg/15 ml solution Take 1 mL by mouth 2 (two) times daily as needed for pain.    Marland Kitchen ipratropium (ATROVENT) 0.03 % nasal spray Place 2 sprays into both nostrils every 12 (twelve) hours as needed for rhinitis. 30 mL 6  . metolazone (ZAROXOLYN) 2.5 MG tablet Take 1 tablet (2.5 mg total) by mouth daily. 90 tablet 3  . metoprolol succinate (TOPROL-XL) 25 MG 24 hr tablet  Take 0.5 tablets (12.5 mg total) by mouth daily. 45 tablet 1  . nitroGLYCERIN (NITROSTAT) 0.4 MG SL tablet DISSOLVE 1 TABLET UNDER THE TONGUE EVERY 5 MINUTES AS NEEDED FOR CHEST PAIN 25 tablet 3  . Omega-3 Fatty Acids (FISH OIL) 1200 MG CAPS Take 1,200 mg by mouth daily.    Vladimir Faster Glycol-Propyl Glycol (SYSTANE) 0.4-0.3 % SOLN Place 1 drop into both eyes 2 (two) times daily as needed (dry/irritated eyes).    . potassium gluconate 595 (99 K) MG TABS tablet Take 595 mg by mouth every other day.    . tamsulosin (FLOMAX) 0.4 MG CAPS capsule Take 0.4 mg by mouth daily after supper.     . traZODone (DESYREL) 150 MG tablet Take 150 mg by mouth at bedtime.     . triamcinolone cream (KENALOG) 0.1 % Apply 1 application topically daily.     No facility-administered medications prior to visit.     Review of Systems:   Constitutional:   No  weight loss, night sweats,  Fevers, chills,  +fatigue, or  lassitude.  HEENT:   No headaches,  Difficulty swallowing,  Tooth/dental problems, or  Sore throat,                 No sneezing, itching, ear ache, nasal congestion, post nasal drip,   CV:  No chest pain,  Orthopnea, PND,  , anasarca, dizziness, palpitations, syncope.   GI  No heartburn, indigestion, abdominal pain, nausea, vomiting, diarrhea, change in bowel habits, loss of appetite, bloody stools.   Resp:  .  No chest wall deformity  Skin: no rash or lesions.  GU: no dysuria, change in color of urine, no urgency or frequency.  No flank pain, no hematuria   MS:  No joint pain or swelling.  No decreased range of motion.  No back pain.    Physical Exam  BP 132/72 (BP Location: Right Arm, Cuff Size: Normal)   Pulse 64   Ht 5\' 9"  (1.753 m)   Wt 188 lb 14.4 oz (85.7 kg)   SpO2 97%   BMI 27.90 kg/m   GEN: A/Ox3; pleasant , NAD, well nourished    HEENT:  [place]/AT,   , NOSE-clear, THROAT-clear, no lesions, no postnasal drip or exudate noted.   NECK:  Supple w/ fair ROM; no JVD; normal carotid impulses w/o bruits; no thyromegaly or nodules palpated; no lymphadenopathy.    RESP  Clear  P & A; w/o, wheezes/ rales/ or rhonchi. no accessory muscle use, no dullness to percussion  CARD:  RRR, no m/r/g, tr peripheral edema, pulses intact, no cyanosis or clubbing.  GI:   Soft & nt; nml bowel sounds; no organomegaly or masses detected.   Musco: Warm bil, no deformities or joint swelling noted.   Neuro: alert, no focal deficits noted.    Skin: Warm, no lesions or rashes    Lab Results:  CBC  BMET  Imaging: No results found.    PFT Results Latest Ref Rng & Units 11/27/2019  FVC-Pre L 2.52  FVC-Predicted Pre % 61  FVC-Post L 2.82  FVC-Predicted Post % 69  Pre FEV1/FVC % % 60  Post FEV1/FCV % % 64  FEV1-Pre L 1.51  FEV1-Predicted Pre % 51  FEV1-Post L 1.80  DLCO UNC% % 83  DLCO COR %Predicted % 98  TLC L 6.94  TLC % Predicted % 101  RV % Predicted % 172    No results found for: NITRICOXIDE  Assessment & Plan:   COPD (chronic obstructive pulmonary disease)  (HCC) Moderate COPD under good control on present regimen with Trelegy daily.  Activity encouraged.  Plan  Patient Instructions  Continue on TRELEGY 1 puff daily, rinse after use.  Albuterol Inhaler or Neb As needed  Wheezing /shortness of breath .  Activity as tolerated.  Set up CT chest for October 2020  Order for new neb machine .  Follow up with Dr. Lamonte Sakai  In 4 months and As needed        Non-small cell cancer of right lung [facility]) Previous non-small cell carcinoma status post resection with right lower lobectomy.  Patient gets yearly surveillance CT.  Has upcoming CT chest October 2021.  CHF (congestive heart failure) ([place]) Appears euvolemic on exam continue follow-up with cardiology.     Rexene Edison, NP 03/28/2020

## 2020-03-28 NOTE — Patient Instructions (Addendum)
Continue on TRELEGY 1 puff daily, rinse after use.  Albuterol Inhaler or Neb As needed  Wheezing /shortness of breath .  Activity as tolerated.  Set up CT chest for October 2020  Order for new neb machine .  Follow up with Dr. Lamonte Sakai  In 4 months and As needed

## 2020-03-28 NOTE — Assessment & Plan Note (Signed)
Previous non-small cell carcinoma status post resection with right lower lobectomy.  Patient gets yearly surveillance CT.  Has upcoming CT chest October 2021.

## 2020-03-28 NOTE — Assessment & Plan Note (Signed)
Appears euvolemic on exam continue follow-up with cardiology.

## 2020-04-07 ENCOUNTER — Telehealth: Payer: Self-pay | Admitting: Cardiology

## 2020-04-07 NOTE — Telephone Encounter (Signed)
Called patient. Samples are ready for him in [place] office.

## 2020-04-07 NOTE — Telephone Encounter (Signed)
Patient calling the office for samples of medication:   1.  What medication and dosage are you requesting samples for? ELIQUIS 5 MG TABS tablet  2.  Are you currently out of this medication? No, but pt states he is almost out

## 2020-04-09 DIAGNOSIS — E119 Type 2 diabetes mellitus without complications: Secondary | ICD-10-CM | POA: Diagnosis not present

## 2020-04-12 DIAGNOSIS — D649 Anemia, unspecified: Secondary | ICD-10-CM | POA: Diagnosis not present

## 2020-04-12 DIAGNOSIS — I4891 Unspecified atrial fibrillation: Secondary | ICD-10-CM | POA: Diagnosis not present

## 2020-04-12 DIAGNOSIS — E119 Type 2 diabetes mellitus without complications: Secondary | ICD-10-CM | POA: Diagnosis not present

## 2020-04-28 ENCOUNTER — Other Ambulatory Visit: Payer: Self-pay | Admitting: Cardiology

## 2020-04-29 ENCOUNTER — Ambulatory Visit: Payer: Medicare Other | Admitting: Emergency Medicine

## 2020-05-05 ENCOUNTER — Ambulatory Visit: Payer: Medicare Other | Admitting: Emergency Medicine

## 2020-05-06 DIAGNOSIS — E1165 Type 2 diabetes mellitus with hyperglycemia: Secondary | ICD-10-CM | POA: Diagnosis not present

## 2020-05-06 DIAGNOSIS — M109 Gout, unspecified: Secondary | ICD-10-CM | POA: Diagnosis not present

## 2020-05-06 DIAGNOSIS — Z0000 Encounter for general adult medical examination without abnormal findings: Secondary | ICD-10-CM | POA: Diagnosis not present

## 2020-05-07 ENCOUNTER — Telehealth: Payer: Self-pay | Admitting: *Deleted

## 2020-05-07 NOTE — Telephone Encounter (Signed)
Spoke with pt about samples for Eliquis that have been ready for 1 month. Pt stated he had been sick but would come and get them today. Put them up front for him.

## 2020-05-13 DIAGNOSIS — D649 Anemia, unspecified: Secondary | ICD-10-CM | POA: Diagnosis not present

## 2020-05-13 DIAGNOSIS — E119 Type 2 diabetes mellitus without complications: Secondary | ICD-10-CM | POA: Diagnosis not present

## 2020-05-13 DIAGNOSIS — I4891 Unspecified atrial fibrillation: Secondary | ICD-10-CM | POA: Diagnosis not present

## 2020-05-22 ENCOUNTER — Telehealth: Payer: Self-pay | Admitting: Emergency Medicine

## 2020-05-22 DIAGNOSIS — J449 Chronic obstructive pulmonary disease, unspecified: Secondary | ICD-10-CM

## 2020-05-22 DIAGNOSIS — C3491 Malignant neoplasm of unspecified part of right bronchus or lung: Secondary | ICD-10-CM

## 2020-05-22 DIAGNOSIS — J431 Panlobular emphysema: Secondary | ICD-10-CM

## 2020-05-22 MED ORDER — ALBUTEROL SULFATE (2.5 MG/3ML) 0.083% IN NEBU
2.5000 mg | INHALATION_SOLUTION | Freq: Four times a day (QID) | RESPIRATORY_TRACT | 12 refills | Status: DC | PRN
Start: 2020-05-22 — End: 2020-10-14

## 2020-05-22 MED ORDER — AZITHROMYCIN 250 MG PO TABS
ORAL_TABLET | ORAL | 0 refills | Status: DC
Start: 2020-05-22 — End: 2020-06-17

## 2020-05-22 NOTE — Telephone Encounter (Signed)
   Assessment & Plan Note by Melvenia Needles, NP at 03/28/2020 10:23 AM Author: Melvenia Needles, NP Author Type: Nurse Practitioner Filed: 03/28/2020 10:23 AM  Note Status: Written Cosign: Cosign Not Required Encounter Date: 03/28/2020  Problem: COPD (chronic obstructive pulmonary disease) ([place])  Editor: Melvenia Needles, NP (Nurse Practitioner)               Moderate COPD under good control on present regimen with Trelegy daily.  Activity encouraged.  Plan  Patient Instructions  Continue on TRELEGY 1 puff daily, rinse after use.  Albuterol Inhaler or Neb As needed  Wheezing /shortness of breath .  Activity as tolerated.  Set up CT chest for October 2020  Order for new neb machine .  Follow up with Dr. Lamonte Sakai  In 4 months and As needed       No order was placed for pt to receive a neb machine after last ov.  I have placed order for nebulizer. Called and spoke with pt letting him know that we will get things taken care of for him to receive new neb machine and he verbalized understanding. Pt verbalized understanding and stated he would need to have Rx for albuterol neb sol refilled so this has been taken care of.  While speaking with pt, he stated that he feels like he has bronchitis as he has had a productive cough x2 days coughing up yellow-green phlegm. Pt also has been wheezing. Pt denies any complaints of fever.  Pt is requesting to have meds called into the pharmacy to help with symptoms. Dr. Lamonte Sakai, please advise.

## 2020-05-22 NOTE — Telephone Encounter (Signed)
Called and spoke with patient to let him know that we are going to send in [place] for him. He asked for it to be sent into [facility]. RX has been sent. Nothing further needed at this time.

## 2020-05-22 NOTE — Telephone Encounter (Signed)
Ok to treat with azithromycin, Z pack.

## 2020-05-26 DIAGNOSIS — J431 Panlobular emphysema: Secondary | ICD-10-CM | POA: Diagnosis not present

## 2020-06-09 ENCOUNTER — Other Ambulatory Visit: Payer: Self-pay

## 2020-06-09 ENCOUNTER — Encounter: Payer: Self-pay | Admitting: Emergency Medicine

## 2020-06-09 ENCOUNTER — Ambulatory Visit (INDEPENDENT_AMBULATORY_CARE_PROVIDER_SITE_OTHER): Payer: Medicare Other | Admitting: Emergency Medicine

## 2020-06-09 VITALS — BP 128/72 | HR 73 | Temp 97.8°F | Ht 69.0 in | Wt 184.0 lb

## 2020-06-09 DIAGNOSIS — J301 Allergic rhinitis due to pollen: Secondary | ICD-10-CM

## 2020-06-09 DIAGNOSIS — J431 Panlobular emphysema: Secondary | ICD-10-CM

## 2020-06-09 DIAGNOSIS — G4719 Other hypersomnia: Secondary | ICD-10-CM | POA: Insufficient documentation

## 2020-06-09 DIAGNOSIS — Z23 Encounter for immunization: Secondary | ICD-10-CM

## 2020-06-09 DIAGNOSIS — G4733 Obstructive sleep apnea (adult) (pediatric): Secondary | ICD-10-CM

## 2020-06-09 DIAGNOSIS — C3491 Malignant neoplasm of unspecified part of right bronchus or lung: Secondary | ICD-10-CM | POA: Diagnosis not present

## 2020-06-09 HISTORY — DX: Other hypersomnia: G47.19

## 2020-06-09 MED ORDER — IPRATROPIUM BROMIDE 0.03 % NA SOLN: 2.0000 | Freq: Two times a day (BID) | NASAL | 6 refills | Status: AC | PRN

## 2020-06-09 NOTE — Assessment & Plan Note (Signed)
No evidence of recurrence based on his most recent CT chest.  He is supposed to be getting annual CTs for his thoracic aorta.  Will continue to follow for any evidence of cancer recurrence on his annual imaging.

## 2020-06-09 NOTE — Assessment & Plan Note (Signed)
Atrovent nasal spray as needed.  We will refill this today.

## 2020-06-09 NOTE — Progress Notes (Signed)
Subjective:    Patient ID: Juan Valdez, male    DOB: [birth year], 74 y.o.   MRN: [id number]  HPI 74 year old man with a history of tobacco use (100 pack years), atrial fibrillation, CAD, diabetes and COPD.  He also has a history of non-small cell lung cancer, post resection in 2015, RL lobectomy with no XRT or chemo. Last CT was in 06/2018, no recurrence. He is on Spiriva and Symbicort, uses fairly reliably, but cost is an issue. He doesn't always get it. Has doesn't cough, sometimes wheezes esp at night.  Flares about once a year. Minimal allergy sx.   ROV 06/09/20 --[name] is 33 has a history of significant former tobacco use, atrial fibrillation with diastolic dysfunction, CAD, diabetes.  We follow him for COPD and a history of right lower lobectomy for non-small cell lung cancer (2015).  Currently managed on Trelegy and albuterol, uses albuterol nebs and HFA - about 3-4x when sx are more active. Some cough, not much. It looks like he has been depending on samples at times for the Trelegy - from here and also from Dr [facility] office. He has applied for financial assistance. His breathing has its ups and downs - he is still able to exert but gets very SOB. He is able to shop, some yard work. He is falling asleep during the day, napping. Unclear whether he snores. Does not always feel well rested in the am when he wakes up. Has nasal gtt, has used atrovent NS prn, needs script for this.  He was treated with azithromycin 05/22/2020 for increased chest congestion.   Most recent CT chest 07/08/2019 reviewed by me, showed no PE, 4.0 x 4.0 ascending thoracic aorta (recommended annual follow-up), evidence for pulmonary hypertension, mildly enlarged precarinal and right hilar nodes with some calcification.  Flu shot today.  COVID vaccine up to date. Due for booster.   Review of Systems  Constitutional: Negative for fever and unexpected weight change.  HENT: Positive for rhinorrhea. Negative for  congestion, dental problem, ear pain, nosebleeds, postnasal drip, sinus pressure, sneezing, sore throat and trouble swallowing.   Eyes: Negative for redness and itching.  Respiratory: Positive for shortness of breath. Negative for cough, chest tightness and wheezing.   Cardiovascular: Positive for chest pain. Negative for palpitations and leg swelling.  Gastrointestinal: Negative for abdominal pain, nausea and vomiting.  Genitourinary: Negative for dysuria.  Musculoskeletal: Negative for joint swelling.  Skin: Negative for rash.  Neurological: Negative for headaches.  Hematological: Does not bruise/bleed easily.  Psychiatric/Behavioral: Negative for dysphoric mood. The patient is not nervous/anxious.     Past Medical History:  Diagnosis Date  . Abdominal bloating 05/06/2019  . Abdominal distension 05/06/2019  . Abnormal nuclear cardiac imaging test 08/22/2019  . 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 Allergic rhinitis 05/29/2019  . Anemia   . Atrial fibrillation ([place]) 09/07/2018  . Bilateral recurrent inguinal hernia without obstruction or gangrene 05/06/2019  . CAD (coronary artery disease)    a. s/p prior DES to RCA b. cath in 07/2019 showing 75% Prox to mid RCA stenosis, 5% ISR along previously placed distal RCA stent, 85% Prox LCx stenosis, and 100% CTO of distal LCx with filled from L--> L collaterals. Also had a long-diffuse 65-75% stenosis along proximal to mid-LAD. Unable to fully expand balloon for PCI of RCA and lesion reduced to 65%. Future atherectotmy of RCA and LCx.   Marland Kitchen CAD S/P percutaneous coronary angioplasty 07/20/2019  . CHF (congestive heart failure) ([place])  11/27/2019  . Colon polyp   . COPD (chronic obstructive pulmonary disease) ([place])   . COPD with acute exacerbation ([place]) 06/14/2019  . Coronary artery disease involving native coronary artery of native heart with angina pectoris ([place]) 07/20/2019  . Diabetes ([place])   . Diabetes mellitus due to underlying condition with  unspecified complications ([place]) 88/41/6606  . Diverticulosis   . DOE (dyspnea on exertion) 07/13/2019  . Essential hypertension 09/07/2018  . 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 Generalized abdominal pain 05/06/2019  . GERD (gastroesophageal reflux disease)   . Gout   . HLD (hyperlipidemia)   . 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 Hx of colonic polyps 05/06/2019  . Irritable bowel syndrome with constipation 05/06/2019  . Lung cancer ([place])   . Medication management 01/04/2019  . Mixed dyslipidemia 09/07/2018  . Non-small cell cancer of right lung ([place]) 11/23/2018  . Onychomycosis due to dermatophyte 11/16/2018  . Orthopnea 01/04/2019  . Perforated bowel ([place])   . Permanent atrial fibrillation ([place]) 09/07/2018  . SBO (small bowel obstruction) ([place]) 05/08/2019  . Shortness of breath 07/08/2019     Family History  Adopted: Yes  Problem Relation Age of Onset  . Cancer Father      Social History   Socioeconomic History  . Marital status: Widowed    Spouse name: Not on file  . Number of children: 0  . Years of education: Not on file  . Highest education level: Not on file  Occupational History  . Occupation: retired  Tobacco Use  . Smoking status: Former Smoker    Packs/day: 3.00    Years: 57.00    Pack years: 171.00    Types: Cigarettes    Start date: 42    Quit date: 06/13/2014    Years since quitting: 5.9  . Smokeless tobacco: Never Used  Vaping Use  . Vaping Use: Never used  Substance and Sexual Activity  . Alcohol use: Not Currently  . Drug use: Never  . Sexual activity: Not on file  Other Topics Concern  . Not on file  Social History Narrative  . Not on file   Social Determinants of Health   Financial Resource Strain:   . Difficulty of Paying Living Expenses: Not on file  Food Insecurity:   . Worried About  Charity fundraiser in the Last Year: Not on file  . Ran Out of Food in the Last Year: Not on file  Transportation Needs:   . Lack of Transportation (Medical): Not on file  . Lack of Transportation (Non-Medical): Not on file  Physical Activity:   . Days of Exercise per Week: Not on file  . Minutes of Exercise per Session: Not on file  Stress:   . Feeling of Stress : Not on file  Social Connections:   . Frequency of Communication with Friends and Family: Not on file  . Frequency of Social Gatherings with Friends and Family: Not on file  . Attends Religious Services: Not on file  . Active Member of Clubs or Organizations: Not on file  . Attends Archivist Meetings: Not on file  . Marital Status: Not on file  Intimate Partner Violence:   . Fear of Current or Ex-Partner: Not on file  . Emotionally Abused: Not on file  . Physically Abused: Not on file  . Sexually  Abused: Not on file  has lived in [place] Has worked as a Multimedia programmer,  Had horses with [place] exposure.    No Known Allergies   Outpatient Medications Prior to Visit  Medication Sig Dispense Refill  . albuterol (PROVENTIL) (2.5 MG/3ML) 0.083% nebulizer solution Take 3 mLs (2.5 mg total) by nebulization every 6 (six) hours as needed for up to 4 doses for wheezing or shortness of breath. 1080 mL 0  . ELIQUIS 5 MG TABS tablet TAKE 1 TABLET BY MOUTH TWICE DAILY 180 tablet 1  . Fluticasone-Umeclidin-Vilant (TRELEGY ELLIPTA) 100-62.5-25 MCG/INH AEPB Inhale 1 puff into the lungs daily. 1 each 0  . ALAWAY 0.025 % ophthalmic solution Place 1 drop into both eyes 2 (two) times daily as needed (allergy/irritated eyes.).    Marland Kitchen albuterol (PROVENTIL) (2.5 MG/3ML) 0.083% nebulizer solution Take 3 mLs (2.5 mg total) by nebulization every 6 (six) hours as needed for wheezing or shortness of breath. 1080 mL 12  . allopurinol (ZYLOPRIM) 300 MG tablet Take 300 mg by mouth daily.    . Ascorbic Acid (VITAMIN C) 1000 MG tablet Take 1,000 mg  by mouth daily.    Marland Kitchen atorvastatin (LIPITOR) 40 MG tablet Take 40 mg by mouth every evening.     Marland Kitchen azithromycin (ZITHROMAX) 250 MG tablet Take 2 tablets first day then 1 tablet daily until finished. 6 tablet 0  . Calcium Carb-Cholecalciferol (CALCIUM 600+D3 PO) Take 1 tablet by mouth daily.    . Cholecalciferol (VITAMIN D3) 50 MCG (2000 UT) TABS Take 2,000 Units by mouth daily.    . clopidogrel (PLAVIX) 75 MG tablet Take 1 tablet (75 mg total) by mouth daily. 90 tablet 3  . Fluticasone-Umeclidin-Vilant (TRELEGY ELLIPTA) 100-62.5-25 MCG/INH AEPB Inhale 100 mg into the lungs daily. 28 each 0  . glipiZIDE (GLUCOTROL XL) 2.5 MG 24 hr tablet Take 2.5 mg by mouth daily with breakfast.    . HOMEOPATHIC PRODUCTS EX Apply 1 application topically 2 (two) times daily as needed (diabetic pain.). Topricin Foot Pain Relief Cream    . HYDROcodone-acetaminophen (HYCET) 7.5-325 mg/15 ml solution Take 1 mL by mouth 2 (two) times daily as needed for pain.    . metolazone (ZAROXOLYN) 2.5 MG tablet Take 1 tablet (2.5 mg total) by mouth daily. 90 tablet 3  . metoprolol succinate (TOPROL-XL) 25 MG 24 hr tablet TAKE 1/2 TABLET BY MOUTH EVERY MORNING 45 tablet 2  . nitroGLYCERIN (NITROSTAT) 0.4 MG SL tablet DISSOLVE 1 TABLET UNDER THE TONGUE EVERY 5 MINUTES AS NEEDED FOR CHEST PAIN 25 tablet 3  . Omega-3 Fatty Acids (FISH OIL) 1200 MG CAPS Take 1,200 mg by mouth daily.    Vladimir Faster Glycol-Propyl Glycol (SYSTANE) 0.4-0.3 % SOLN Place 1 drop into both eyes 2 (two) times daily as needed (dry/irritated eyes).    . potassium gluconate 595 (99 K) MG TABS tablet Take 595 mg by mouth every other day.    . tamsulosin (FLOMAX) 0.4 MG CAPS capsule Take 0.4 mg by mouth daily after supper.     . traZODone (DESYREL) 150 MG tablet Take 150 mg by mouth at bedtime.     . triamcinolone cream (KENALOG) 0.1 % Apply 1 application topically daily.    Marland Kitchen ipratropium (ATROVENT) 0.03 % nasal spray Place 2 sprays into both nostrils every 12  (twelve) hours as needed for rhinitis. 30 mL 6   No facility-administered medications prior to visit.        Objective:   Physical Exam Vitals:  06/09/20 0931  BP: 128/72  Pulse: 73  Temp: 97.8 F (36.6 C)  TempSrc: Oral  SpO2: 97%  Weight: 184 lb (83.5 kg)  Height: _0  (1.753 m)   Gen: Pleasant, well-nourished, in no distress,  normal affect  ENT: No lesions,  mouth clear,  oropharynx clear, no postnasal drip  Neck: No JVD, no stridor  Lungs: No use of accessory muscles, no crackles or wheezing on normal respiration, no wheeze on forced expiration  Cardiovascular: RRR, heart sounds normal, no murmur or gallops, no peripheral edema  Musculoskeletal: No deformities, no cyanosis or clubbing  Neuro: alert, awake, non focal  Skin: Warm, no lesions or rash      Assessment & Plan:  COPD (chronic obstructive pulmonary disease) (HCC) We will continue Trelegy 1 inhalation once daily.  Continue to follow with Dr. Helene Kelp regarding financial assistant for this medication Continue your albuterol either 2 puffs or 1 nebulizer treatment when you need it for shortness of breath, chest tightness, wheezing. COVID vaccine is up-to-date.  You qualify to receive the booster shot Flu shot today Follow Dr. Lamonte Sakai next available after your sleep study so that we can review the results together.  Allergic rhinitis Atrovent nasal spray as needed.  We will refill this today.  Non-small cell cancer of right lung (HCC) No evidence of recurrence based on his most recent CT chest.  He is supposed to be getting annual CTs for his thoracic aorta.  Will continue to follow for any evidence of cancer recurrence on his annual imaging.  Excessive daytime sleepiness We will arrange for a split-night sleep study and plan to follow-up after it is completed to see if he qualifies for CPAP.  Baltazar Apo, MD, PhD 06/09/2020, 10:00 AM [facility] [phone] or if no answer  [phone]

## 2020-06-09 NOTE — Assessment & Plan Note (Signed)
We will continue Trelegy 1 inhalation once daily.  Continue to follow with Dr. Helene Kelp regarding financial assistant for this medication Continue your albuterol either 2 puffs or 1 nebulizer treatment when you need it for shortness of breath, chest tightness, wheezing. COVID vaccine is up-to-date.  You qualify to receive the booster shot Flu shot today Follow Dr. Lamonte Sakai next available after your sleep study so that we can review the results together.

## 2020-06-09 NOTE — Patient Instructions (Addendum)
We will continue Trelegy 1 inhalation once daily.  Continue to follow with Dr. Helene Kelp regarding financial assistant for this medication Continue your albuterol either 2 puffs or 1 nebulizer treatment when you need it for shortness of breath, chest tightness, wheezing. Get your CT scan of the chest to follow your thoracic aorta as planned by cardiology October 2021 We will arrange for a split-night sleep study COVID vaccine is up-to-date.  You qualify to receive the booster shot Flu shot today Follow Dr. Lamonte Sakai next available after your sleep study so that we can review the results together.

## 2020-06-09 NOTE — Assessment & Plan Note (Signed)
We will arrange for a split-night sleep study and plan to follow-up after it is completed to see if he qualifies for CPAP.

## 2020-06-11 ENCOUNTER — Telehealth: Payer: Self-pay | Admitting: Cardiology

## 2020-06-11 NOTE — Telephone Encounter (Signed)
How do you advise?

## 2020-06-11 NOTE — Telephone Encounter (Signed)
Pt c/o Shortness Of Breath: STAT if SOB developed within the last 24 hours or pt is noticeably SOB on the phone  1. Are you currently SOB (can you hear that pt is SOB on the phone)? No   2. How long have you been experiencing SOB? About a week  3. Are you SOB when sitting or when up moving around? At any time  4. Are you currently experiencing any other symptoms? No.  He saw he pulmonologist 2 days ago.  He wants to have an ultra sound of his heart done.

## 2020-06-12 DIAGNOSIS — E119 Type 2 diabetes mellitus without complications: Secondary | ICD-10-CM | POA: Diagnosis not present

## 2020-06-12 DIAGNOSIS — D649 Anemia, unspecified: Secondary | ICD-10-CM | POA: Diagnosis not present

## 2020-06-12 DIAGNOSIS — I4891 Unspecified atrial fibrillation: Secondary | ICD-10-CM | POA: Diagnosis not present

## 2020-06-13 ENCOUNTER — Telehealth: Payer: Self-pay | Admitting: Cardiology

## 2020-06-13 NOTE — Telephone Encounter (Signed)
Pt has an appt on 06/18/20 for increased shortness or breath. Pt informed to call 911 or go to the ED for increased shortness of breath.

## 2020-06-13 NOTE — Telephone Encounter (Signed)
previous message pt requesting an echo per his pulmonologist.

## 2020-06-13 NOTE — Telephone Encounter (Signed)
Pt c/o Shortness Of Breath: STAT if SOB developed within the last 24 hours or pt is noticeably SOB on the phone  1. Are you currently SOB (can you hear that pt is SOB on the phone)? no  2. How long have you been experiencing SOB? Couple of weeks  3. Are you SOB when sitting or when up moving around? both  4. Are you currently experiencing any other symptoms? No    Patient states he has been having a hard time breathing for the last couple of weeks. He is scheduled 06/23/2020

## 2020-06-16 NOTE — Telephone Encounter (Signed)
Okay you can put her in for an echo she can get it done in [place] quickly.  And sent to her pulmonologist.

## 2020-06-17 DIAGNOSIS — K219 Gastro-esophageal reflux disease without esophagitis: Secondary | ICD-10-CM | POA: Insufficient documentation

## 2020-06-17 DIAGNOSIS — K631 Perforation of intestine (nontraumatic): Secondary | ICD-10-CM | POA: Insufficient documentation

## 2020-06-17 DIAGNOSIS — M109 Gout, unspecified: Secondary | ICD-10-CM | POA: Insufficient documentation

## 2020-06-17 DIAGNOSIS — C3490 Malignant neoplasm of unspecified part of unspecified bronchus or lung: Secondary | ICD-10-CM | POA: Insufficient documentation

## 2020-06-17 DIAGNOSIS — K635 Polyp of colon: Secondary | ICD-10-CM | POA: Insufficient documentation

## 2020-06-17 DIAGNOSIS — K5790 Diverticulosis of intestine, part unspecified, without perforation or abscess without bleeding: Secondary | ICD-10-CM | POA: Insufficient documentation

## 2020-06-17 DIAGNOSIS — E785 Hyperlipidemia, unspecified: Secondary | ICD-10-CM | POA: Insufficient documentation

## 2020-06-17 DIAGNOSIS — D649 Anemia, unspecified: Secondary | ICD-10-CM | POA: Insufficient documentation

## 2020-06-17 DIAGNOSIS — E119 Type 2 diabetes mellitus without complications: Secondary | ICD-10-CM | POA: Insufficient documentation

## 2020-06-18 ENCOUNTER — Other Ambulatory Visit: Payer: Self-pay

## 2020-06-18 ENCOUNTER — Other Ambulatory Visit: Payer: Self-pay | Admitting: Cardiology

## 2020-06-18 ENCOUNTER — Encounter: Payer: Self-pay | Admitting: Cardiology

## 2020-06-18 ENCOUNTER — Ambulatory Visit (INDEPENDENT_AMBULATORY_CARE_PROVIDER_SITE_OTHER): Payer: Medicare Other | Admitting: Cardiology

## 2020-06-18 VITALS — BP 131/63 | HR 72 | Ht 69.0 in | Wt 185.4 lb

## 2020-06-18 DIAGNOSIS — E088 Diabetes mellitus due to underlying condition with unspecified complications: Secondary | ICD-10-CM

## 2020-06-18 DIAGNOSIS — C3491 Malignant neoplasm of unspecified part of right bronchus or lung: Secondary | ICD-10-CM

## 2020-06-18 DIAGNOSIS — I2510 Atherosclerotic heart disease of native coronary artery without angina pectoris: Secondary | ICD-10-CM

## 2020-06-18 DIAGNOSIS — R0600 Dyspnea, unspecified: Secondary | ICD-10-CM

## 2020-06-18 DIAGNOSIS — R0609 Other forms of dyspnea: Secondary | ICD-10-CM

## 2020-06-18 DIAGNOSIS — E782 Mixed hyperlipidemia: Secondary | ICD-10-CM | POA: Diagnosis not present

## 2020-06-18 DIAGNOSIS — R590 Localized enlarged lymph nodes: Secondary | ICD-10-CM

## 2020-06-18 DIAGNOSIS — I10 Essential (primary) hypertension: Secondary | ICD-10-CM | POA: Diagnosis not present

## 2020-06-18 DIAGNOSIS — Z7901 Long term (current) use of anticoagulants: Secondary | ICD-10-CM | POA: Diagnosis not present

## 2020-06-18 DIAGNOSIS — G4733 Obstructive sleep apnea (adult) (pediatric): Secondary | ICD-10-CM

## 2020-06-18 DIAGNOSIS — I4821 Permanent atrial fibrillation: Secondary | ICD-10-CM | POA: Diagnosis not present

## 2020-06-18 DIAGNOSIS — L989 Disorder of the skin and subcutaneous tissue, unspecified: Secondary | ICD-10-CM | POA: Diagnosis not present

## 2020-06-18 MED ORDER — RANOLAZINE ER 1000 MG PO TB12: 1000.0000 mg | ORAL_TABLET | Freq: Two times a day (BID) | ORAL | 3 refills | Status: DC

## 2020-06-18 NOTE — Patient Instructions (Signed)
Medication Instructions:  Your physician has recommended you make the following change in your medication:   Start Ranexa 500 mg twice daily for 2 weeks. After 2 weeks you will increase to 1000 mg twice daily.  *If you need a refill on your cardiac medications before your next appointment, please call your pharmacy*   Lab Work: Your physician recommends that you return for lab work in: tomorrow. You need to have labs done when you are fasting.  You can come Monday through Friday 8:30 am to 12:00 pm and 1:15 to 4:30. You do not need to make an appointment as the order has already been placed. The labs you are going to have done are BMET, LFT and Lipids.   If you have labs (blood work) drawn today and your tests are completely normal, you will receive your results only by: Marland Kitchen MyChart Message (if you have MyChart) OR . A paper copy in the mail If you have any lab test that is abnormal or we need to change your treatment, we will call you to review the results.   Testing/Procedures: Your physician has requested that you have an echocardiogram. Echocardiography is a painless test that uses sound waves to create images of your heart. It provides your doctor with information about the size and shape of your heart and how well your heart's chambers and valves are working. This procedure takes approximately one hour. There are no restrictions for this procedure.  The test will take approximately 3 to 4 hours to complete; you may bring reading material.  If someone comes with you to your appointment, they will need to remain in the main lobby due to limited space in the testing area.   How to prepare for your Myocardial Perfusion Test: . Do not eat or drink 3 hours prior to your test, except you may have water. . Do not consume products containing caffeine (regular or decaffeinated) 12 hours prior to your test. (ex: coffee, chocolate, sodas, tea). . Do bring a list of your current medications with  you.  If not listed below, you may take your medications as normal. . Do wear comfortable clothes (no dresses or overalls) and walking shoes, tennis shoes preferred (No heels or open toe shoes are allowed). . Do NOT wear cologne, perfume, aftershave, or lotions (deodorant is allowed). . If these instructions are not followed, your test will have to be rescheduled.    Follow-Up: At [facility], you and your health needs are our priority.  As part of our continuing mission to provide you with exceptional heart care, we have created designated Provider Care Teams.  These Care Teams include your primary Cardiologist (physician) and Advanced Practice Providers (APPs -  Physician Assistants and Nurse Practitioners) who all work together to provide you with the care you need, when you need it.  We recommend signing up for the patient portal called "MyChart".  Sign up information is provided on this After Visit Summary.  MyChart is used to connect with patients for Virtual Visits (Telemedicine).  Patients are able to view lab/test results, encounter notes, upcoming appointments, etc.  Non-urgent messages can be sent to your provider as well.   To learn more about what you can do with MyChart, go to [email or web address].    Your next appointment:   1 month(s)  The format for your next appointment:   In Person  Provider:   Jyl Heinz, MD   Other Instructions Ranolazine tablets, extended release What is this medicine?  RANOLAZINE (ra NOE la zeen) is a heart medicine. It is used to treat chronic chest pain (angina). This medicine must be taken regularly. It will not relieve an acute episode of chest pain. This medicine may be used for other purposes; ask your health care provider or pharmacist if you have questions. COMMON BRAND NAME(S): Ranexa What should I tell my health care provider before I take this medicine? They need to know if you have any of these conditions:  heart  disease  irregular heartbeat  kidney disease  liver disease  low levels of potassium or magnesium in the blood  an unusual or allergic reaction to ranolazine, other medicines, foods, dyes, or preservatives  pregnant or trying to get pregnant  breast-feeding How should I use this medicine? Take this medicine by mouth with a glass of water. Follow the directions on the prescription label. Do not cut, crush, or chew this medicine. Take with or without food. Do not take this medication with grapefruit juice. Take your doses at regular intervals. Do not take your medicine more often then directed. Talk to your pediatrician regarding the use of this medicine in children. Special care may be needed. Overdosage: If you think you have taken too much of this medicine contact a poison control center or emergency room at once. NOTE: This medicine is only for you. Do not share this medicine with others. What if I miss a dose? If you miss a dose, take it as soon as you can. If it is almost time for your next dose, take only that dose. Do not take double or extra doses. What may interact with this medicine? Do not take this medicine with any of the following medications:  antivirals for HIV or AIDS  cerivastatin  certain antibiotics like chloramphenicol, clarithromycin, dalfopristin; quinupristin, isoniazid, rifabutin, rifampin, rifapentine  certain medicines used for cancer like imatinib, nilotinib  certain medicines for fungal infections like fluconazole, itraconazole, ketoconazole, posaconazole, voriconazole  certain medicines for irregular heart beat like dronedarone  certain medicines for seizures like carbamazepine, fosphenytoin, oxcarbazepine, phenobarbital, phenytoin  cisapride  conivaptan  cyclosporine  grapefruit or grapefruit juice  lumacaftor; ivacaftor  nefazodone  pimozide  quinacrine  St John's wort  thioridazine This medicine may also interact with the  following medications:  alfuzosin  certain medicines for depression, anxiety, or psychotic disturbances like bupropion, citalopram, fluoxetine, fluphenazine, paroxetine, perphenazine, risperidone, sertraline, trifluoperazine  certain medicines for cholesterol like atorvastatin, lovastatin, simvastatin  certain medicines for stomach problems like octreotide, palonosetron, prochlorperazine  eplerenone  ergot alkaloids like dihydroergotamine, ergonovine, ergotamine, methylergonovine  metformin  nicardipine  other medicines that prolong the QT interval (cause an abnormal heart rhythm) like dofetilide, ziprasidone  sirolimus  tacrolimus This list may not describe all possible interactions. Give your health care provider a list of all the medicines, herbs, non-prescription drugs, or dietary supplements you use. Also tell them if you smoke, drink alcohol, or use illegal drugs. Some items may interact with your medicine. What should I watch for while using this medicine? Visit your doctor for regular check ups. Tell your doctor or healthcare professional if your symptoms do not start to get better or if they get worse. This medicine will not relieve an acute attack of angina or chest pain. This medicine can change your heart rhythm. Your health care provider may check your heart rhythm by ordering an electrocardiogram (ECG) while you are taking this medicine. You may get drowsy or dizzy. Do not drive, use machinery, or do  anything that needs mental alertness until you know how this medicine affects you. Do not stand or sit up quickly, especially if you are an older patient. This reduces the risk of dizzy or fainting spells. Alcohol may interfere with the effect of this medicine. Avoid alcoholic drinks. If you are scheduled for any medical or dental procedure, tell your healthcare provider that you are taking this medicine. This medicine can interact with other medicines used during surgery. What  side effects may I notice from receiving this medicine? Side effects that you should report to your doctor or health care professional as soon as possible:  allergic reactions like skin rash, itching or hives, swelling of the face, lips, or tongue  breathing problems  changes in vision  fast, irregular or pounding heartbeat  feeling faint or lightheaded, falls  low or high blood pressure  numbness or tingling feelings  ringing in the ears  tremor or shakiness  slow heartbeat (fewer than 50 beats per minute)  swelling of the legs or feet Side effects that usually do not require medical attention (report to your doctor or health care professional if they continue or are bothersome):  constipation  drowsy  dry mouth  headache  nausea or vomiting  stomach upset This list may not describe all possible side effects. Call your doctor for medical advice about side effects. You may report side effects to FDA at 1-800-FDA-1088. Where should I keep my medicine? Keep out of the reach of children. Store at room temperature between 15 and 30 degrees C (59 and 86 degrees F). Throw away any unused medicine after the expiration date. NOTE: This sheet is a summary. It may not cover all possible information. If you have questions about this medicine, talk to your doctor, pharmacist, or health care provider.  2020 Elsevier/Gold Standard (2018-08-22 09:18:49)  Cardiac Nuclear Scan A cardiac nuclear scan is a test that is done to check the flow of blood to your heart. It is done when you are resting and when you are exercising. The test looks for problems such as:  Not enough blood reaching a portion of the heart.  The heart muscle not working as it should. You may need this test if:  You have heart disease.  You have had lab results that are not normal.  You have had heart surgery or a balloon procedure to open up blocked arteries (angioplasty).  You have chest pain.  You have  shortness of breath. In this test, a special dye (tracer) is put into your bloodstream. The tracer will travel to your heart. A camera will then take pictures of your heart to see how the tracer moves through your heart. This test is usually done at a hospital and takes 2-4 hours. Tell a doctor about:  Any allergies you have.  All medicines you are taking, including vitamins, herbs, eye drops, creams, and over-the-counter medicines.  Any problems you or family members have had with anesthetic medicines.  Any blood disorders you have.  Any surgeries you have had.  Any medical conditions you have.  Whether you are pregnant or may be pregnant. What are the risks? Generally, this is a safe test. However, problems may occur, such as:  Serious chest pain and heart attack. This is only a risk if the stress portion of the test is done.  Rapid heartbeat.  A feeling of warmth in your chest. This feeling usually does not last long.  Allergic reaction to the tracer. What happens  before the test?  Ask your doctor about changing or stopping your normal medicines. This is important.  Follow instructions from your doctor about what you cannot eat or drink.  Remove your jewelry on the day of the test. What happens during the test?  An IV tube will be inserted into one of your veins.  Your doctor will give you a small amount of tracer through the IV tube.  You will wait for 20-40 minutes while the tracer moves through your bloodstream.  Your heart will be monitored with an electrocardiogram (ECG).  You will lie down on an exam table.  Pictures of your heart will be taken for about 15-20 minutes.  You may also have a stress test. For this test, one of these things may be done: ? You will be asked to exercise on a treadmill or a stationary bike. ? You will be given medicines that will make your heart work harder. This is done if you are unable to exercise.  When blood flow to your  heart has peaked, a tracer will again be given through the IV tube.  After 20-40 minutes, you will get back on the exam table. More pictures will be taken of your heart.  Depending on the tracer that is used, more pictures may need to be taken 3-4 hours later.  Your IV tube will be removed when the test is over. The test may vary among doctors and hospitals. What happens after the test?  Ask your doctor: ? Whether you can return to your normal schedule, including diet, activities, and medicines. ? Whether you should drink more fluids. This will help to remove the tracer from your body. Drink enough fluid to keep your pee (urine) pale yellow.  Ask your doctor, or the department that is doing the test: ? When will my results be ready? ? How will I get my results? Summary  A cardiac nuclear scan is a test that is done to check the flow of blood to your heart.  Tell your doctor whether you are pregnant or may be pregnant.  Before the test, ask your doctor about changing or stopping your normal medicines. This is important.  Ask your doctor whether you can return to your normal activities. You may be asked to drink more fluids. This information is not intended to replace advice given to you by your health care provider. Make sure you discuss any questions you have with your health care provider. Document Revised: 12/20/2018 Document Reviewed: 02/13/2018 Elsevier Patient Education  [place].  Echocardiogram An echocardiogram is a procedure that uses painless sound waves (ultrasound) to produce an image of the heart. Images from an echocardiogram can provide important information about:  Signs of coronary artery disease (CAD).  Aneurysm detection. An aneurysm is a weak or damaged part of an artery wall that bulges out from the normal force of blood pumping through the body.  Heart size and shape. Changes in the size or shape of the heart can be associated with certain  conditions, including heart failure, aneurysm, and CAD.  Heart muscle function.  Heart valve function.  Signs of a past heart attack.  Fluid buildup around the heart.  Thickening of the heart muscle.  A tumor or infectious growth around the heart valves. Tell a health care provider about:  Any allergies you have.  All medicines you are taking, including vitamins, herbs, eye drops, creams, and over-the-counter medicines.  Any blood disorders you have.  Any surgeries  you have had.  Any medical conditions you have.  Whether you are pregnant or may be pregnant. What are the risks? Generally, this is a safe procedure. However, problems may occur, including:  Allergic reaction to dye (contrast) that may be used during the procedure. What happens before the procedure? No specific preparation is needed. You may eat and drink normally. What happens during the procedure?   An IV tube may be inserted into one of your veins.  You may receive contrast through this tube. A contrast is an injection that improves the quality of the pictures from your heart.  A gel will be applied to your chest.  A wand-like tool (transducer) will be moved over your chest. The gel will help to transmit the sound waves from the transducer.  The sound waves will harmlessly bounce off of your heart to allow the heart images to be captured in real-time motion. The images will be recorded on a computer. The procedure may vary among health care providers and hospitals. What happens after the procedure?  You may return to your normal, everyday life, including diet, activities, and medicines, unless your health care provider tells you not to do that. Summary  An echocardiogram is a procedure that uses painless sound waves (ultrasound) to produce an image of the heart.  Images from an echocardiogram can provide important information about the size and shape of your heart, heart muscle function, heart valve  function, and fluid buildup around your heart.  You do not need to do anything to prepare before this procedure. You may eat and drink normally.  After the echocardiogram is completed, you may return to your normal, everyday life, unless your health care provider tells you not to do that. This information is not intended to replace advice given to you by your health care provider. Make sure you discuss any questions you have with your health care provider. Document Revised: 12/21/2018 Document Reviewed: 10/02/2016 Elsevier Patient Education  [place].

## 2020-06-18 NOTE — Progress Notes (Signed)
Cardiology Office Note:    Date:  06/18/2020   ID:  Juan Valdez, DOB December 28, 1945, MRN [id number]  PCP:  Ronita Hipps, MD  Cardiologist:  Jenean Lindau, MD   Referring MD: Ronita Hipps, MD    ASSESSMENT:    1. Permanent atrial fibrillation ([place])   2. Coronary artery disease involving native coronary artery of native heart without angina pectoris   3. Essential hypertension   4. Mixed dyslipidemia   5. Diabetes mellitus due to underlying condition with unspecified complications ([place])    PLAN:    In order of problems listed above:  1. Dyspnea on exertion: I discussed my findings with the patient at extensive length.  Again these are complex and difficult to understand in view of coronary anatomy.  I made the following recommendations to him.  I would prefer him to go to cardiac rehab and be evaluated.  Also we will do a Lexiscan to see for any objective evidence of obstructive coronary artery disease that may be causing the symptoms at this point.  He is agreeable. 2. Permanent atrial fibrillation:I discussed with the patient atrial fibrillation, disease process. Management and therapy including rate and rhythm control, anticoagulation benefits and potential risks were discussed extensively with the patient. Patient had multiple questions which were answered to patient's satisfaction. 3. Essential hypertension: Blood pressure stable 4. Mixed dyslipidemia diabetes mellitus: Diet was discussed extensively with the patient and I want him to come back for complete blood work in the next few days and is agreeable. 5. In view of his symptoms have initiated him on Ranexa 500 mg twice daily for 2 weeks and then 1 g twice daily for 2 weeks.  He will be seen follow-up appointment in a month or earlier if he has any concerns.  I also recommended cardiac rehab program and he is agreeable.  He knows to go to nearest emergency room for any concerning symptoms.   Medication Adjustments/Labs  and Tests Ordered: Current medicines are reviewed at length with the patient today.  Concerns regarding medicines are outlined above.  No orders of the defined types were placed in this encounter.  No orders of the defined types were placed in this encounter.    No chief complaint on file.    History of Present Illness:    Juan Valdez is a 74 y.o. male.  Patient has history of coronary artery disease with complex coronary artery disease and the details are mentioned below.  He has permanent atrial fibrillation, coronary artery disease, dyslipidemia and diabetes mellitus.  He complains of shortness of breath on exertion.  His pulmonologist has requested an echocardiogram.  No orthopnea or PND.  No chest pain.  His symptoms are complex and difficult to understand.  His possible that with represent anginal equivalents.  At the time of my evaluation, the patient is alert awake oriented and in no distress.  Past Medical History:  Diagnosis Date  . Abdominal bloating 05/06/2019  . Abdominal distension 05/06/2019  . Abnormal nuclear cardiac imaging test 08/22/2019  . 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 Allergic rhinitis 05/29/2019  . Anemia   . Atrial fibrillation ([place]) 09/07/2018  . Bilateral recurrent inguinal hernia without obstruction or gangrene 05/06/2019  . CAD (coronary artery disease)    a. s/p prior DES to RCA b. cath in 07/2019 showing 75% Prox to mid RCA stenosis, 5% ISR along previously placed distal RCA stent, 85% Prox LCx stenosis, and 100% CTO of distal LCx  with filled from L--> L collaterals. Also had a long-diffuse 65-75% stenosis along proximal to mid-LAD. Unable to fully expand balloon for PCI of RCA and lesion reduced to 65%. Future atherectotmy of RCA and LCx.   Marland Kitchen CAD S/P percutaneous coronary angioplasty 07/20/2019  . CHF (congestive heart failure) ([place]) 11/27/2019  . Colon polyp   . COPD (chronic obstructive pulmonary disease) ([place])   . COPD with acute exacerbation  ([place]) 06/14/2019  . Coronary artery disease involving native coronary artery of native heart with angina pectoris ([place]) 07/20/2019  . Diabetes ([place])   . Diabetes mellitus due to underlying condition with unspecified complications ([place]) 07/10/2535  . Diverticulosis   . DOE (dyspnea on exertion) 07/13/2019  . Essential hypertension 09/07/2018  . 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 Generalized abdominal pain 05/06/2019  . GERD (gastroesophageal reflux disease)   . Gout   . HLD (hyperlipidemia)   . 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 Hx of colonic polyps 05/06/2019  . Irritable bowel syndrome with constipation 05/06/2019  . Lung cancer ([place])   . Medication management 01/04/2019  . Mixed dyslipidemia 09/07/2018  . Non-small cell cancer of right lung ([place]) 11/23/2018  . Onychomycosis due to dermatophyte 11/16/2018  . Orthopnea 01/04/2019  . Perforated bowel ([place])   . Permanent atrial fibrillation ([place]) 09/07/2018  . SBO (small bowel obstruction) ([place]) 05/08/2019  . Shortness of breath 07/08/2019    Past Surgical History:  Procedure Laterality Date  . ABDOMINAL SURGERY    . CORONARY ANGIOPLASTY WITH STENT PLACEMENT  2003  . CORONARY ATHERECTOMY N/A 08/22/2019   Procedure: CORONARY ATHERECTOMY;  Surgeon: Leonie Man, MD;  Location: [facility] [place] [facility] [facility];  Service: Cardiovascular;  Laterality: N/A;  . CORONARY BALLOON ANGIOPLASTY N/A 07/20/2019   Procedure: CORONARY BALLOON ANGIOPLASTY;  Surgeon: Leonie Man, MD;  Location: [facility];  Service: Cardiovascular;  Laterality: N/A;  RCA  . CORONARY BALLOON ANGIOPLASTY N/A 08/22/2019   Procedure: CORONARY BALLOON ANGIOPLASTY;  Surgeon: Leonie Man, MD;  Location: [facility];  Service: Cardiovascular;  Laterality: N/A;  . CORONARY STENT INTERVENTION  08/22/2019  . CORONARY STENT  INTERVENTION N/A 08/22/2019   Procedure: CORONARY STENT INTERVENTION;  Surgeon: Leonie Man, MD;  Location: [facility];  Service: Cardiovascular;  Laterality: N/A;  . LEFT HEART CATH AND CORONARY ANGIOGRAPHY N/A 07/20/2019   Procedure: LEFT HEART CATH AND CORONARY ANGIOGRAPHY;  Surgeon: Leonie Man, MD;  Location: [facility];  Service: Cardiovascular;  Laterality: N/A;  . LEFT HEART CATH AND CORONARY ANGIOGRAPHY N/A 08/22/2019   Procedure: LEFT HEART CATH AND CORONARY ANGIOGRAPHY;  Surgeon: Leonie Man, MD;  Location: [facility];  Service: Cardiovascular;  Laterality: N/A;  . LUNG CANCER SURGERY    . ROTATOR CUFF REPAIR Left   . TEMPORARY PACEMAKER N/A 08/22/2019   Procedure: TEMPORARY PACEMAKER;  Surgeon: Leonie Man, MD;  Location: [facility];  Service: Cardiovascular;  Laterality: N/A;  . TONSILLECTOMY     age 53    Current Medications: Current Meds  Medication Sig  . ALAWAY 0.025 % ophthalmic solution Place 1 drop into both eyes 2 (two) times daily as needed (allergy/irritated eyes.).  Marland Kitchen albuterol (PROVENTIL) (2.5 MG/3ML) 0.083% nebulizer solution Take 3 mLs (2.5 mg total) by nebulization every 6 (six) hours  as needed for up to 4 doses for wheezing or shortness of breath.  Marland Kitchen albuterol (PROVENTIL) (2.5 MG/3ML) 0.083% nebulizer solution Take 3 mLs (2.5 mg total) by nebulization every 6 (six) hours as needed for wheezing or shortness of breath.  . allopurinol (ZYLOPRIM) 300 MG tablet Take 300 mg by mouth daily.  . Ascorbic Acid (VITAMIN C) 1000 MG tablet Take 1,000 mg by mouth daily.  Marland Kitchen atorvastatin (LIPITOR) 40 MG tablet Take 40 mg by mouth every evening.   . Calcium Carb-Cholecalciferol (CALCIUM 600+D3 PO) Take 1 tablet by mouth daily.  . Cholecalciferol (VITAMIN D3) 50 MCG (2000 UT) TABS Take 2,000 Units by mouth daily.  . clopidogrel (PLAVIX) 75 MG tablet Take 1 tablet (75 mg total) by mouth daily.  Marland Kitchen ELIQUIS 5 MG TABS tablet TAKE 1 TABLET  BY MOUTH TWICE DAILY  . Fluticasone-Umeclidin-Vilant (TRELEGY ELLIPTA) 100-62.5-25 MCG/INH AEPB Inhale 1 puff into the lungs daily.  Marland Kitchen glipiZIDE (GLUCOTROL XL) 2.5 MG 24 hr tablet Take 2.5 mg by mouth daily with breakfast.  . HOMEOPATHIC PRODUCTS EX Apply 1 application topically 2 (two) times daily as needed (diabetic pain.). Topricin Foot Pain Relief Cream  . HYDROcodone-acetaminophen (HYCET) 7.5-325 mg/15 ml solution Take 1 mL by mouth 2 (two) times daily as needed for pain.  Marland Kitchen ipratropium (ATROVENT) 0.03 % nasal spray Place 2 sprays into both nostrils every 12 (twelve) hours as needed for rhinitis.  . metoprolol succinate (TOPROL-XL) 25 MG 24 hr tablet TAKE 1/2 TABLET BY MOUTH EVERY MORNING  . nitroGLYCERIN (NITROSTAT) 0.4 MG SL tablet DISSOLVE 1 TABLET UNDER THE TONGUE EVERY 5 MINUTES AS NEEDED FOR CHEST PAIN  . Omega-3 Fatty Acids (FISH OIL) 1200 MG CAPS Take 1,200 mg by mouth daily.  Vladimir Faster Glycol-Propyl Glycol (SYSTANE) 0.4-0.3 % SOLN Place 1 drop into both eyes 2 (two) times daily as needed (dry/irritated eyes).  . potassium gluconate 595 (99 K) MG TABS tablet Take 595 mg by mouth every other day.  Marland Kitchen PROAIR HFA 108 (90 Base) MCG/ACT inhaler Inhale 2 puffs into the lungs every 2 (two) hours as needed.  . RESTASIS 0.05 % ophthalmic emulsion Place 1 drop into both eyes every 12 (twelve) hours.  . tamsulosin (FLOMAX) 0.4 MG CAPS capsule Take 0.4 mg by mouth daily after supper.   . traZODone (DESYREL) 150 MG tablet Take 150 mg by mouth at bedtime.   . triamcinolone cream (KENALOG) 0.1 % Apply 1 application topically daily.     Allergies:   Patient has no known allergies.   Social History   Socioeconomic History  . Marital status: Widowed    Spouse name: Not on file  . Number of children: 0  . Years of education: Not on file  . Highest education level: Not on file  Occupational History  . Occupation: retired  Tobacco Use  . Smoking status: Former Smoker    Packs/day: 3.00     Years: 57.00    Pack years: 171.00    Types: Cigarettes    Start date: 37    Quit date: 06/13/2014    Years since quitting: 6.0  . Smokeless tobacco: Never Used  Vaping Use  . Vaping Use: Never used  Substance and Sexual Activity  . Alcohol use: Not Currently  . Drug use: Never  . Sexual activity: Not on file  Other Topics Concern  . Not on file  Social History Narrative  . Not on file   Social Determinants of Health   Financial Resource Strain:   .  Difficulty of Paying Living Expenses: Not on file  Food Insecurity:   . Worried About Charity fundraiser in the Last Year: Not on file  . Ran Out of Food in the Last Year: Not on file  Transportation Needs:   . Lack of Transportation (Medical): Not on file  . Lack of Transportation (Non-Medical): Not on file  Physical Activity:   . Days of Exercise per Week: Not on file  . Minutes of Exercise per Session: Not on file  Stress:   . Feeling of Stress : Not on file  Social Connections:   . Frequency of Communication with Friends and Family: Not on file  . Frequency of Social Gatherings with Friends and Family: Not on file  . Attends Religious Services: Not on file  . Active Member of Clubs or Organizations: Not on file  . Attends Archivist Meetings: Not on file  . Marital Status: Not on file     Family History: The patient's family history includes Cancer in his father. He was adopted.  ROS:   Please see the history of present illness.    All other systems reviewed and are negative.  EKGs/Labs/Other Studies Reviewed:    The following studies were reviewed today: Leonie Man, MD (Primary)    Procedures  CORONARY BALLOON ANGIOPLASTY  LEFT HEART CATH AND CORONARY ANGIOGRAPHY  Conclusion    Prox RCA to Mid RCA lesion is 75% stenosed.  Scoring balloon angioplasty was performed using a BALLOON [place] EMERGE MR 3.0X8.  Post intervention, there is a 65% residual stenosis. Unable to fully expand balloon /  deliver stent.  Prox Cx lesion is 85% stenosed- calcified.  Dist Cx - 3rd Mrg lesion is 100% stenosed.  Dist RCA Stent is 5% stenosed.  The left ventricular systolic function is normal.  The left ventricular ejection fraction is 55-65% by visual estimate.  There is no mitral valve regurgitation.  There is no aortic valve stenosis.   SUMMARY  Severe 2 Vessel CAD - focal 85% calcified pLCx & 100% CTO of dCx-OM4 (fills L-L collaterals), long-diffuse 65-75% p-m LAD segmental lesion (moderately calcified) with patent dRCA stent. ? PTCA only of p-mRCA - unable to deliver stent (will need Atherctomy)  Normal LVEF & LVEDP   RECOMMENDATION  Will observe overnight post PTCA -- will need Atherectomy based PCI of RCA & LCx - Scheduled for 12/9 1st Case (will need updated H&P upon arrival).  Loaded with Brilinta in Cath Lab - but with Eliquis, will convert to Plavix in AM - 1 more dose Brilinta tonight, 300 mg Plavix in AM then 75 mg BID  Aggressive RF management   [name], MD  Leonie Man, MD (Primary)    Procedures  CORONARY BALLOON ANGIOPLASTY  CORONARY STENT INTERVENTION  CORONARY ATHERECTOMY  LEFT HEART CATH AND CORONARY ANGIOGRAPHY  TEMPORARY PACEMAKER  Conclusion    LESION #1: Prox RCA to Mid RCA lesion is 65-70% stenosed.  Orbital atherectomy was performed on the entire segment.  A drug-eluting stent was successfully placed covering the entire lesion, using a STENT RESOLUTE ONYX 4.0X38 -> this was postdilated with a 4.5 mm balloon to roughly 4.6 mm.  Post intervention, there is a 0% residual stenosis.  Previously placed Dist RCA stent is 5% stenosed.  ----------------------------------------  Prox Cx lesion is 85% stenosed. Prox Cx to Mid Cx lesion is 70% stenosed (this lesion actually became identifiable with angioplasty of the ostial lesion)  Scoring balloon angioplasty was performed  using a BALLOON [place]OL[place] 2.50X10 -followed by a 2.5 mm x  30mm [place] balloon  Stent was not delivered due to inability to fully expand the balloon in the 70% lesion.  Post intervention, there is a 45% residual stenosis in the original identified 85% lesion.  Post intervention, there is a 50% residual stenosis in the newly identified 70% more calcified lesion..  -Downstream: Dist Cx lesion is 100% stenosed. 3rd Mrg lesion is 100% stenosed.  ----------------------------------------  LV end diastolic pressure is normal. There is no aortic valve stenosis.   SUMMARY  Successful orbital atherectomy based PCI of the proximal to mid RCA-> Resolute Onyx DES 4.0 mm x 38 mm postdilated to 4.5 mm)  Scoring balloon angioplasty only of proximal LCx (unable to advance wire adequately to perform atherectomy) -> reduced from 85% to 50%.  Normal LVEDP  Successful temporary pacemaker placement   RECOMMENDATIONS  Overnight monitoring for post PCI care and sheath removal.  We will restart Eliquis tonight  He is on statin and beta-blocker.  Will adjust medicines accordingly.  Provided he has improvement of symptoms, would just continue with PTCA only of the Circumflex as there was a suboptimal but fairly good result reducing 85% down to 50% stenosis.   He will follow up with Dr. Geraldo Pitter     Recent Labs: 11/14/2019: ALT 16; Hemoglobin 16.4; NT-Pro BNP 616; Platelets 119.0 12/04/2019: BNP 81.4 03/04/2020: BUN 26; Creatinine, Ser 1.24; Potassium 4.2; Sodium 139  Recent Lipid Panel    Component Value Date/Time   CHOL 128 08/30/2019 0852   TRIG 145 08/30/2019 0852   HDL 37 (L) 08/30/2019 0852   CHOLHDL 3.5 08/30/2019 0852   LDLCALC 66 08/30/2019 0852    Physical Exam:    VS:  BP 131/63   Pulse 72   Ht 5\' 9"  (1.753 m)   Wt 185 lb 6.4 oz (84.1 kg)   SpO2 92%   BMI 27.38 kg/m     Wt Readings from Last 3 Encounters:  06/18/20 185 lb 6.4 oz (84.1 kg)  06/09/20 184 lb (83.5 kg)  03/28/20 188 lb 14.4 oz (85.7 kg)     GEN: Patient is in no  acute distress HEENT: Normal NECK: No JVD; No carotid bruits LYMPHATICS: No lymphadenopathy CARDIAC: Hear sounds regular, 2/6 systolic murmur at the apex. RESPIRATORY:  Clear to auscultation without rales, wheezing or rhonchi  ABDOMEN: Soft, non-tender, non-distended MUSCULOSKELETAL:  No edema; No deformity  SKIN: Warm and dry NEUROLOGIC:  Alert and oriented x 3 PSYCHIATRIC:  Normal affect   Signed, Jenean Lindau, MD  06/18/2020 10:01 AM    [place]

## 2020-06-18 NOTE — Telephone Encounter (Signed)
4 boxes of Eliquis given to pt as per Dr. Julien Nordmann request. Lot #[id number] EXP 04/2022

## 2020-06-20 ENCOUNTER — Other Ambulatory Visit: Payer: Self-pay

## 2020-06-20 DIAGNOSIS — I10 Essential (primary) hypertension: Secondary | ICD-10-CM | POA: Diagnosis not present

## 2020-06-20 DIAGNOSIS — E782 Mixed hyperlipidemia: Secondary | ICD-10-CM | POA: Diagnosis not present

## 2020-06-20 DIAGNOSIS — I2510 Atherosclerotic heart disease of native coronary artery without angina pectoris: Secondary | ICD-10-CM | POA: Diagnosis not present

## 2020-06-20 LAB — LIPID PANEL
Chol/HDL Ratio: 3.7 ratio (ref 0.0–5.0)
Cholesterol, Total: 130 mg/dL (ref 100–199)
HDL: 35 mg/dL — ABNORMAL LOW (ref >39)
LDL Chol Calc (NIH): 77 mg/dL (ref 0–99)
Triglycerides: 92 mg/dL (ref 0–149)
VLDL Cholesterol Cal: 18 mg/dL (ref 5–40)

## 2020-06-20 LAB — BASIC METABOLIC PANEL
BUN/Creatinine Ratio: 19 (ref 10–24)
BUN: 26 mg/dL (ref 8–27)
CO2: 29 mmol/L (ref 20–29)
Calcium: 9.4 mg/dL (ref 8.6–10.2)
Chloride: 98 mmol/L (ref 96–106)
Creatinine, Ser: 1.35 mg/dL — ABNORMAL HIGH (ref 0.76–1.27)
GFR calc Af Amer: 59 mL/min/{1.73_m2} — ABNORMAL LOW (ref >59)
GFR calc non Af Amer: 51 mL/min/{1.73_m2} — ABNORMAL LOW (ref >59)
Glucose: 131 mg/dL — ABNORMAL HIGH (ref 65–99)
Potassium: 4.4 mmol/L (ref 3.5–5.2)
Sodium: 139 mmol/L (ref 134–144)

## 2020-06-20 LAB — HEPATIC FUNCTION PANEL
ALT: 18 IU/L (ref 0–44)
AST: 21 IU/L (ref 0–40)
Albumin: 4.6 g/dL (ref 3.7–4.7)
Alkaline Phosphatase: 133 IU/L — ABNORMAL HIGH (ref 44–121)
Bilirubin Total: 1 mg/dL (ref 0.0–1.2)
Bilirubin, Direct: 0.29 mg/dL (ref 0.00–0.40)
Total Protein: 6.6 g/dL (ref 6.0–8.5)

## 2020-06-23 ENCOUNTER — Ambulatory Visit: Payer: Medicare Other | Admitting: Cardiology

## 2020-06-25 ENCOUNTER — Telehealth (HOSPITAL_COMMUNITY): Payer: Self-pay

## 2020-06-25 DIAGNOSIS — L300 Nummular dermatitis: Secondary | ICD-10-CM | POA: Diagnosis not present

## 2020-06-25 DIAGNOSIS — C44629 Squamous cell carcinoma of skin of left upper limb, including shoulder: Secondary | ICD-10-CM | POA: Diagnosis not present

## 2020-06-25 DIAGNOSIS — L853 Xerosis cutis: Secondary | ICD-10-CM | POA: Diagnosis not present

## 2020-06-25 NOTE — Telephone Encounter (Signed)
Spoke with the patient, detailed instructions given. He stated that he would be here for his test. Asked to call back with any questions. S.Julliette Frentz EMTP 

## 2020-06-26 ENCOUNTER — Ambulatory Visit: Payer: Medicare Other | Admitting: Emergency Medicine

## 2020-07-02 ENCOUNTER — Encounter: Payer: Self-pay | Admitting: *Deleted

## 2020-07-02 ENCOUNTER — Telehealth: Payer: Self-pay | Admitting: *Deleted

## 2020-07-02 NOTE — Telephone Encounter (Signed)
Patient given detailed instructions per Myocardial Perfusion Study Information Sheet for the test on 07/08/2020 at 0745. Patient notified to arrive 15 minutes early and that it is imperative to arrive on time for appointment to keep from having the test rescheduled.  If you need to cancel or reschedule your appointment, please call the office within 24 hours of your appointment. . Patient verbalized understanding.[facility] Mychart letter sent with instructions.

## 2020-07-06 ENCOUNTER — Ambulatory Visit (HOSPITAL_BASED_OUTPATIENT_CLINIC_OR_DEPARTMENT_OTHER): Payer: Medicare Other | Attending: Emergency Medicine | Admitting: Pulmonary Disease

## 2020-07-09 ENCOUNTER — Telehealth: Payer: Self-pay

## 2020-07-09 DIAGNOSIS — R140 Abdominal distension (gaseous): Secondary | ICD-10-CM | POA: Diagnosis not present

## 2020-07-09 DIAGNOSIS — K5909 Other constipation: Secondary | ICD-10-CM | POA: Diagnosis not present

## 2020-07-09 DIAGNOSIS — R0982 Postnasal drip: Secondary | ICD-10-CM | POA: Diagnosis not present

## 2020-07-09 DIAGNOSIS — R0600 Dyspnea, unspecified: Secondary | ICD-10-CM | POA: Diagnosis not present

## 2020-07-09 NOTE — Telephone Encounter (Signed)
Spoke with the patient, detailed instructions given. He stated that he understood and would be here for his test. Asked to call back with any questions. EMTP

## 2020-07-10 ENCOUNTER — Other Ambulatory Visit: Payer: Medicare Other

## 2020-07-10 DIAGNOSIS — R109 Unspecified abdominal pain: Secondary | ICD-10-CM | POA: Diagnosis not present

## 2020-07-10 DIAGNOSIS — R140 Abdominal distension (gaseous): Secondary | ICD-10-CM | POA: Diagnosis not present

## 2020-07-11 ENCOUNTER — Telehealth: Payer: Self-pay | Admitting: Cardiology

## 2020-07-11 NOTE — Telephone Encounter (Signed)
Spoke with [name] per DPR and gave recommendation as per Dr. Geraldo Pitter. [name] verbalized understanding and had no additional questions.

## 2020-07-11 NOTE — Telephone Encounter (Signed)
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 There are foci of aortic atherosclerosis as well as great vessel and coronary artery calcification. No thoracic aortic dissection is appreciable; the contrast bolus is not sufficient in the aorta to assess meaningfully for potential dissection as a differential consideration. Recommend annual imaging followup by CTA or MRA   Pt is scheduled for Echo and NM Stress test 07/17/20.

## 2020-07-11 NOTE — Telephone Encounter (Signed)
Spoke with the pt and he reports that he had a repeat CT with his PCP Dr Helene Kelp yesterday and was told that he had an enlarged Aorta.. they are asking if it is safe for him to have his planned stress test 07/17/20.   I advised them that I will call Dr. Greggory Keen office and have them fax the CTreport to Dr. Geraldo Pitter for review and to compare to the CT he had done 06/2019.   I left a detailed message on Dr. Greggory Keen med rec voicemail with our fax number. [phone])  Advised the pt that I believe he is fine to have his stress test but would need to have Dr. Geraldo Pitter review.

## 2020-07-11 NOTE — Telephone Encounter (Signed)
Patient states his recent CT showed that he has an enlarged aorta and he would like to know if this will affect stress test scheduled for 07/17/20. Please advise.

## 2020-07-11 NOTE — Telephone Encounter (Signed)
Patient returning call.

## 2020-07-11 NOTE — Telephone Encounter (Signed)
Please get me this report of the CT scan from [facility].  He is okay to have a [place].

## 2020-07-12 ENCOUNTER — Encounter (HOSPITAL_BASED_OUTPATIENT_CLINIC_OR_DEPARTMENT_OTHER): Payer: Medicare Other | Admitting: Pulmonary Disease

## 2020-07-12 DIAGNOSIS — I4891 Unspecified atrial fibrillation: Secondary | ICD-10-CM | POA: Diagnosis not present

## 2020-07-12 DIAGNOSIS — D649 Anemia, unspecified: Secondary | ICD-10-CM | POA: Diagnosis not present

## 2020-07-12 DIAGNOSIS — E119 Type 2 diabetes mellitus without complications: Secondary | ICD-10-CM | POA: Diagnosis not present

## 2020-07-14 ENCOUNTER — Ambulatory Visit: Payer: Medicare Other | Admitting: Cardiology

## 2020-07-15 ENCOUNTER — Telehealth: Payer: Self-pay | Admitting: Cardiology

## 2020-07-15 NOTE — Telephone Encounter (Signed)
New message:    Patient calling concerning a referal and would like to know if he can go somewhere closer to his home.

## 2020-07-15 NOTE — Telephone Encounter (Signed)
Pt states that he would like to do the silver sneakers program instead of cardiac rehab because the last time he tried cardiac rehab it was hard on his knees. Pt states that they have trainers that can help him with a program. How do you advise?

## 2020-07-15 NOTE — Telephone Encounter (Signed)
Yes he can provide that the stress test is negative and it is pending at this time.

## 2020-07-16 NOTE — Telephone Encounter (Signed)
Recommendation reviewed with pt as per Dr. Julien Nordmann note.  Pt verbalized understanding and had no additional questions.

## 2020-07-17 ENCOUNTER — Encounter (HOSPITAL_BASED_OUTPATIENT_CLINIC_OR_DEPARTMENT_OTHER): Payer: Medicare Other | Admitting: Pulmonary Disease

## 2020-07-17 ENCOUNTER — Other Ambulatory Visit: Payer: Self-pay

## 2020-07-17 ENCOUNTER — Ambulatory Visit (INDEPENDENT_AMBULATORY_CARE_PROVIDER_SITE_OTHER): Payer: Medicare Other

## 2020-07-17 ENCOUNTER — Telehealth: Payer: Self-pay | Admitting: Cardiology

## 2020-07-17 DIAGNOSIS — I2510 Atherosclerotic heart disease of native coronary artery without angina pectoris: Secondary | ICD-10-CM

## 2020-07-17 DIAGNOSIS — E088 Diabetes mellitus due to underlying condition with unspecified complications: Secondary | ICD-10-CM | POA: Diagnosis not present

## 2020-07-17 DIAGNOSIS — R0609 Other forms of dyspnea: Secondary | ICD-10-CM

## 2020-07-17 DIAGNOSIS — R0600 Dyspnea, unspecified: Secondary | ICD-10-CM

## 2020-07-17 LAB — MYOCARDIAL PERFUSION IMAGING
LV dias vol: 167 mL (ref 62–150)
LV sys vol: 110 mL
Peak HR: 60 {beats}/min
Rest HR: 54 {beats}/min
SDS: 1
SRS: 17
SSS: 16
TID: 0.99

## 2020-07-17 MED ORDER — TECHNETIUM TC 99M TETROFOSMIN IV KIT
32.5000 | PACK | Freq: Once | INTRAVENOUS | Status: AC | PRN
  Administered 2020-07-17: 32.5 via INTRAVENOUS

## 2020-07-17 MED ORDER — TECHNETIUM TC 99M TETROFOSMIN IV KIT
10.9000 | PACK | Freq: Once | INTRAVENOUS | Status: AC | PRN
  Administered 2020-07-17: 10.9 via INTRAVENOUS

## 2020-07-17 MED ORDER — REGADENOSON 0.4 MG/5ML IV SOLN
0.4000 mg | Freq: Once | INTRAVENOUS | Status: AC
  Administered 2020-07-17: 0.4 mg via INTRAVENOUS

## 2020-07-17 NOTE — Telephone Encounter (Signed)
Patient calling to speak with Dr. Julien Nordmann nurse. He had a stress test today and just has a couple of questions he needs to ask her. States he is not having any issues.

## 2020-07-18 ENCOUNTER — Telehealth: Payer: Self-pay | Admitting: Cardiology

## 2020-07-18 NOTE — Telephone Encounter (Signed)
    I went in pt's chart to see who called him today 

## 2020-07-18 NOTE — Telephone Encounter (Signed)
Spoke with patient at time of results. No additional questions.

## 2020-07-21 DIAGNOSIS — I4891 Unspecified atrial fibrillation: Secondary | ICD-10-CM | POA: Diagnosis not present

## 2020-07-23 ENCOUNTER — Ambulatory Visit (INDEPENDENT_AMBULATORY_CARE_PROVIDER_SITE_OTHER): Payer: Medicare Other | Admitting: Cardiology

## 2020-07-23 ENCOUNTER — Encounter: Payer: Self-pay | Admitting: Cardiology

## 2020-07-23 ENCOUNTER — Other Ambulatory Visit: Payer: Self-pay

## 2020-07-23 VITALS — BP 126/68 | HR 62 | Ht 69.0 in | Wt 180.0 lb

## 2020-07-23 DIAGNOSIS — I10 Essential (primary) hypertension: Secondary | ICD-10-CM | POA: Diagnosis not present

## 2020-07-23 DIAGNOSIS — E782 Mixed hyperlipidemia: Secondary | ICD-10-CM

## 2020-07-23 DIAGNOSIS — Z9861 Coronary angioplasty status: Secondary | ICD-10-CM

## 2020-07-23 DIAGNOSIS — I2510 Atherosclerotic heart disease of native coronary artery without angina pectoris: Secondary | ICD-10-CM

## 2020-07-23 DIAGNOSIS — I4821 Permanent atrial fibrillation: Secondary | ICD-10-CM

## 2020-07-23 NOTE — Progress Notes (Signed)
Cardiology Office Note:    Date:  07/23/2020   ID:  Juan Valdez, DOB 03-21-1946, MRN [id number]  PCP:  Ronita Hipps, MD  Cardiologist:  Jenean Lindau, MD   Referring MD: Ronita Hipps, MD    ASSESSMENT:    1. Permanent atrial fibrillation ([place])   2. Coronary artery disease involving native coronary artery of native heart without angina pectoris   3. CAD S/P percutaneous coronary angioplasty   4. Essential hypertension   5. Mixed hyperlipidemia   6. Mixed dyslipidemia    PLAN:    In order of problems listed above:  1. Coronary artery disease: Primary prevention stressed with the patient.  Importance of compliance with diet medication stressed any vocalized understanding.  He is happy to be walking on a regular basis. 2. Essential hypertension: Blood pressure stable and diet was emphasized.  Lifestyle modification was advised. 3. Permanent atrial fibrillation:I discussed with the patient atrial fibrillation, disease process. Management and therapy including rate and rhythm control, anticoagulation benefits and potential risks were discussed extensively with the patient. Patient had multiple questions which were answered to patient's satisfaction. 4. Mixed dyslipidemia: On statin therapy and tolerating well.  Lipids reviewed. 5. Patient will be seen in follow-up appointment in 6 months or earlier if the patient has any concerns    Medication Adjustments/Labs and Tests Ordered: Current medicines are reviewed at length with the patient today.  Concerns regarding medicines are outlined above.  No orders of the defined types were placed in this encounter.  No orders of the defined types were placed in this encounter.    No chief complaint on file.    History of Present Illness:    Juan Valdez is a 74 y.o. male.  Patient has past medical history of coronary artery disease, essential hypertension, dyslipidemia and atrial fibrillation.  He is a diabetic.  He  denies any problems at this time and takes care of activities of daily living.  No chest pain orthopnea or PND.  Patient mentions to me that he was diagnosed to have low iron and now receiving iron tablets and feeling much better and walking regularly.  He is also taking Ranexa on a daily basis.  At the time of my evaluation, the patient is alert awake oriented and in no distress.  Past Medical History:  Diagnosis Date  . Abdominal bloating 05/06/2019  . Abdominal distension 05/06/2019  . Abnormal nuclear cardiac imaging test 08/22/2019  . 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 Allergic rhinitis 05/29/2019  . Anemia   . Atrial fibrillation ([place]) 09/07/2018  . Bilateral recurrent inguinal hernia without obstruction or gangrene 05/06/2019  . CAD (coronary artery disease)    a. s/p prior DES to RCA b. cath in 07/2019 showing 75% Prox to mid RCA stenosis, 5% ISR along previously placed distal RCA stent, 85% Prox LCx stenosis, and 100% CTO of distal LCx with filled from L--> L collaterals. Also had a long-diffuse 65-75% stenosis along proximal to mid-LAD. Unable to fully expand balloon for PCI of RCA and lesion reduced to 65%. Future atherectotmy of RCA and LCx.   Marland Kitchen CAD S/P percutaneous coronary angioplasty 07/20/2019  . CHF (congestive heart failure) ([place]) 11/27/2019  . Colon polyp   . COPD (chronic obstructive pulmonary disease) ([place])   . COPD with acute exacerbation ([place]) 06/14/2019  . Coronary artery disease involving native coronary artery of native heart with angina pectoris ([place]) 07/20/2019  . Diabetes ([place])   . Diabetes mellitus  due to underlying condition with unspecified complications ([place]) 13/24/4010  . Diverticulosis   . DOE (dyspnea on exertion) 07/13/2019  . Essential hypertension 09/07/2018  . 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 Generalized abdominal pain  05/06/2019  . GERD (gastroesophageal reflux disease)   . Gout   . HLD (hyperlipidemia)   . 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 Hx of colonic polyps 05/06/2019  . Irritable bowel syndrome with constipation 05/06/2019  . Lung cancer ([place])   . Medication management 01/04/2019  . Mixed dyslipidemia 09/07/2018  . Non-small cell cancer of right lung ([place]) 11/23/2018  . Onychomycosis due to dermatophyte 11/16/2018  . Orthopnea 01/04/2019  . Perforated bowel ([place])   . Permanent atrial fibrillation ([place]) 09/07/2018  . SBO (small bowel obstruction) ([place]) 05/08/2019  . Shortness of breath 07/08/2019    Past Surgical History:  Procedure Laterality Date  . ABDOMINAL SURGERY    . CORONARY ANGIOPLASTY WITH STENT PLACEMENT  2003  . CORONARY ATHERECTOMY N/A 08/22/2019   Procedure: CORONARY ATHERECTOMY;  Surgeon: Leonie Man, MD;  Location: [facility];  Service: Cardiovascular;  Laterality: N/A;  . CORONARY BALLOON ANGIOPLASTY N/A 07/20/2019   Procedure: CORONARY BALLOON ANGIOPLASTY;  Surgeon: Leonie Man, MD;  Location: [facility];  Service: Cardiovascular;  Laterality: N/A;  RCA  . CORONARY BALLOON ANGIOPLASTY N/A 08/22/2019   Procedure: CORONARY BALLOON ANGIOPLASTY;  Surgeon: Leonie Man, MD;  Location: [facility];  Service: Cardiovascular;  Laterality: N/A;  . CORONARY STENT INTERVENTION  08/22/2019  . CORONARY STENT INTERVENTION N/A 08/22/2019   Procedure: CORONARY STENT INTERVENTION;  Surgeon: Leonie Man, MD;  Location: [facility];  Service: Cardiovascular;  Laterality: N/A;  . LEFT HEART CATH AND CORONARY ANGIOGRAPHY N/A 07/20/2019   Procedure: LEFT HEART CATH AND CORONARY ANGIOGRAPHY;  Surgeon: Leonie Man, MD;  Location: [facility];  Service: Cardiovascular;  Laterality: N/A;  . LEFT HEART CATH AND CORONARY ANGIOGRAPHY N/A 08/22/2019   Procedure: LEFT HEART CATH AND CORONARY ANGIOGRAPHY;  Surgeon: Leonie Man, MD;   Location: [facility];  Service: Cardiovascular;  Laterality: N/A;  . LUNG CANCER SURGERY    . ROTATOR CUFF REPAIR Left   . TEMPORARY PACEMAKER N/A 08/22/2019   Procedure: TEMPORARY PACEMAKER;  Surgeon: Leonie Man, MD;  Location: [facility];  Service: Cardiovascular;  Laterality: N/A;  . TONSILLECTOMY     age 19    Current Medications: Current Meds  Medication Sig  . ALAWAY 0.025 % ophthalmic solution Place 1 drop into both eyes 2 (two) times daily as needed (allergy/irritated eyes.).  Marland Kitchen albuterol (PROVENTIL) (2.5 MG/3ML) 0.083% nebulizer solution Take 3 mLs (2.5 mg total) by nebulization every 6 (six) hours as needed for up to 4 doses for wheezing or shortness of breath.  Marland Kitchen albuterol (PROVENTIL) (2.5 MG/3ML) 0.083% nebulizer solution Take 3 mLs (2.5 mg total) by nebulization every 6 (six) hours as needed for wheezing or shortness of breath.  . allopurinol (ZYLOPRIM) 300 MG tablet Take 300 mg by mouth daily.  . Ascorbic Acid (VITAMIN C) 1000 MG tablet Take 1,000 mg by mouth daily.  Marland Kitchen atorvastatin (LIPITOR) 40 MG tablet Take 40 mg by mouth every evening.   . Calcium Carb-Cholecalciferol (CALCIUM 600+D3 PO) Take 1 tablet by mouth daily.  . Cholecalciferol (  VITAMIN D3) 50 MCG (2000 UT) TABS Take 2,000 Units by mouth daily.  . clopidogrel (PLAVIX) 75 MG tablet Take 1 tablet (75 mg total) by mouth daily.  Marland Kitchen ELIQUIS 5 MG TABS tablet TAKE 1 TABLET BY MOUTH TWICE DAILY  . Fluticasone-Umeclidin-Vilant (TRELEGY ELLIPTA) 100-62.5-25 MCG/INH AEPB Inhale 1 puff into the lungs daily.  Marland Kitchen glipiZIDE (GLUCOTROL XL) 2.5 MG 24 hr tablet Take 2.5 mg by mouth daily with breakfast.  . HOMEOPATHIC PRODUCTS EX Apply 1 application topically 2 (two) times daily as needed (diabetic pain.). Topricin Foot Pain Relief Cream  . HYDROcodone-acetaminophen (HYCET) 7.5-325 mg/15 ml solution Take 1 mL by mouth 2 (two) times daily as needed for pain.  Marland Kitchen ipratropium (ATROVENT) 0.03 % nasal spray Place 2 sprays  into both nostrils every 12 (twelve) hours as needed for rhinitis.  . metoprolol succinate (TOPROL-XL) 25 MG 24 hr tablet TAKE 1/2 TABLET BY MOUTH EVERY MORNING  . nitroGLYCERIN (NITROSTAT) 0.4 MG SL tablet DISSOLVE 1 TABLET UNDER THE TONGUE EVERY 5 MINUTES AS NEEDED FOR CHEST PAIN  . Omega-3 Fatty Acids (FISH OIL) 1200 MG CAPS Take 1,200 mg by mouth daily.  Vladimir Faster Glycol-Propyl Glycol (SYSTANE) 0.4-0.3 % SOLN Place 1 drop into both eyes 2 (two) times daily as needed (dry/irritated eyes).  . potassium gluconate 595 (99 K) MG TABS tablet Take 595 mg by mouth every other day.  Marland Kitchen PROAIR HFA 108 (90 Base) MCG/ACT inhaler Inhale 2 puffs into the lungs every 2 (two) hours as needed.  . ranolazine (RANEXA) 1000 MG SR tablet Take 1 tablet (1,000 mg total) by mouth 2 (two) times daily.  . RESTASIS 0.05 % ophthalmic emulsion Place 1 drop into both eyes every 12 (twelve) hours.  . tamsulosin (FLOMAX) 0.4 MG CAPS capsule Take 0.4 mg by mouth daily after supper.   . traZODone (DESYREL) 150 MG tablet Take 150 mg by mouth at bedtime.   . triamcinolone cream (KENALOG) 0.1 % Apply 1 application topically daily.     Allergies:   Patient has no known allergies.   Social History   Socioeconomic History  . Marital status: Widowed    Spouse name: Not on file  . Number of children: 0  . Years of education: Not on file  . Highest education level: Not on file  Occupational History  . Occupation: retired  Tobacco Use  . Smoking status: Former Smoker    Packs/day: 3.00    Years: 57.00    Pack years: 171.00    Types: Cigarettes    Start date: 28    Quit date: 06/13/2014    Years since quitting: 6.1  . Smokeless tobacco: Never Used  Vaping Use  . Vaping Use: Never used  Substance and Sexual Activity  . Alcohol use: Not Currently  . Drug use: Never  . Sexual activity: Not on file  Other Topics Concern  . Not on file  Social History Narrative  . Not on file   Social Determinants of Health    Financial Resource Strain:   . Difficulty of Paying Living Expenses: Not on file  Food Insecurity:   . Worried About Charity fundraiser in the Last Year: Not on file  . Ran Out of Food in the Last Year: Not on file  Transportation Needs:   . Lack of Transportation (Medical): Not on file  . Lack of Transportation (Non-Medical): Not on file  Physical Activity:   . Days of Exercise per Week: Not on file  .  Minutes of Exercise per Session: Not on file  Stress:   . Feeling of Stress : Not on file  Social Connections:   . Frequency of Communication with Friends and Family: Not on file  . Frequency of Social Gatherings with Friends and Family: Not on file  . Attends Religious Services: Not on file  . Active Member of Clubs or Organizations: Not on file  . Attends Archivist Meetings: Not on file  . Marital Status: Not on file     Family History: The patient's family history includes Cancer in his father. He was adopted.  ROS:   Please see the history of present illness.    All other systems reviewed and are negative.  EKGs/Labs/Other Studies Reviewed:    The following studies were reviewed today: I discussed my findings with the patient at length including stress test.   Recent Labs: 11/14/2019: Hemoglobin 16.4; NT-Pro BNP 616; Platelets 119.0 12/04/2019: BNP 81.4 06/20/2020: ALT 18; BUN 26; Creatinine, Ser 1.35; Potassium 4.4; Sodium 139  Recent Lipid Panel    Component Value Date/Time   CHOL 130 06/20/2020 0804   TRIG 92 06/20/2020 0804   HDL 35 (L) 06/20/2020 0804   CHOLHDL 3.7 06/20/2020 0804   LDLCALC 77 06/20/2020 0804    Physical Exam:    VS:  BP 126/68   Pulse 62   Ht 5\' 9"  (1.753 m)   Wt 180 lb (81.6 kg)   SpO2 95%   BMI 26.58 kg/m     Wt Readings from Last 3 Encounters:  07/23/20 180 lb (81.6 kg)  07/17/20 185 lb (83.9 kg)  06/18/20 185 lb 6.4 oz (84.1 kg)     GEN: Patient is in no acute distress HEENT: Normal NECK: No JVD; No carotid  bruits LYMPHATICS: No lymphadenopathy CARDIAC: Hear sounds regular, 2/6 systolic murmur at the apex. RESPIRATORY:  Clear to auscultation without rales, wheezing or rhonchi  ABDOMEN: Soft, non-tender, non-distended MUSCULOSKELETAL:  No edema; No deformity  SKIN: Warm and dry NEUROLOGIC:  Alert and oriented x 3 PSYCHIATRIC:  Normal affect   Signed, Jenean Lindau, MD  07/23/2020 10:22 AM    [facility]

## 2020-07-23 NOTE — Patient Instructions (Signed)
Medication Instructions:  No medication changes. *If you need a refill on your cardiac medications before your next appointment, please call your pharmacy*   Lab Work: None ordered If you have labs (blood work) drawn today and your tests are completely normal, you will receive your results only by: Marland Kitchen MyChart Message (if you have MyChart) OR . A paper copy in the mail If you have any lab test that is abnormal or we need to change your treatment, we will call you to review the results.   Testing/Procedures: None ordered   Follow-Up: At [facility], you and your health needs are our priority.  As part of our continuing mission to provide you with exceptional heart care, we have created designated Provider Care Teams.  These Care Teams include your primary Cardiologist (physician) and Advanced Practice Providers (APPs -  Physician Assistants and Nurse Practitioners) who all work together to provide you with the care you need, when you need it.  We recommend signing up for the patient portal called "MyChart".  Sign up information is provided on this After Visit Summary.  MyChart is used to connect with patients for Virtual Visits (Telemedicine).  Patients are able to view lab/test results, encounter notes, upcoming appointments, etc.  Non-urgent messages can be sent to your provider as well.   To learn more about what you can do with MyChart, go to [email or web address].    Your next appointment:   4 month(s)  The format for your next appointment:   In Person  Provider:   Jyl Heinz, MD   Other Instructions NA

## 2020-07-28 DIAGNOSIS — R5383 Other fatigue: Secondary | ICD-10-CM | POA: Diagnosis not present

## 2020-07-28 DIAGNOSIS — R0600 Dyspnea, unspecified: Secondary | ICD-10-CM | POA: Diagnosis not present

## 2020-07-28 DIAGNOSIS — Z79899 Other long term (current) drug therapy: Secondary | ICD-10-CM | POA: Diagnosis not present

## 2020-07-28 DIAGNOSIS — Z8679 Personal history of other diseases of the circulatory system: Secondary | ICD-10-CM | POA: Diagnosis not present

## 2020-07-28 DIAGNOSIS — R109 Unspecified abdominal pain: Secondary | ICD-10-CM | POA: Diagnosis not present

## 2020-07-28 DIAGNOSIS — D649 Anemia, unspecified: Secondary | ICD-10-CM | POA: Diagnosis not present

## 2020-07-28 DIAGNOSIS — Z7689 Persons encountering health services in other specified circumstances: Secondary | ICD-10-CM | POA: Diagnosis not present

## 2020-07-30 ENCOUNTER — Ambulatory Visit: Payer: Medicare Other | Admitting: Emergency Medicine

## 2020-08-05 ENCOUNTER — Telehealth: Payer: Self-pay | Admitting: Gastroenterology

## 2020-08-05 NOTE — Telephone Encounter (Signed)
I met this patient 1 time in August 2020 and did an extensive chart review and offered further diagnostics as well as evaluation.  The patient has not followed up since August 2020 and never reached out to us since then.  No further GI work-up was performed since he has not followed up or reach out to us.  Based on prior evaluation he had also been seen by GI in [place] and decided not to continue further care there either.  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  However the patient may choose to have his care by whom ever he feels comfortable with.  I defer ultimate decision to Dr. Nandigam. 

## 2020-08-06 ENCOUNTER — Other Ambulatory Visit: Payer: Self-pay

## 2020-08-06 ENCOUNTER — Ambulatory Visit (INDEPENDENT_AMBULATORY_CARE_PROVIDER_SITE_OTHER): Payer: Medicare Other

## 2020-08-06 DIAGNOSIS — I4821 Permanent atrial fibrillation: Secondary | ICD-10-CM | POA: Diagnosis not present

## 2020-08-06 DIAGNOSIS — R0600 Dyspnea, unspecified: Secondary | ICD-10-CM | POA: Diagnosis not present

## 2020-08-06 DIAGNOSIS — R0609 Other forms of dyspnea: Secondary | ICD-10-CM

## 2020-08-06 DIAGNOSIS — I2510 Atherosclerotic heart disease of native coronary artery without angina pectoris: Secondary | ICD-10-CM | POA: Diagnosis not present

## 2020-08-06 LAB — ECHOCARDIOGRAM COMPLETE
Area-P 1/2: 3.77 cm2
Calc EF: 44.1 %
S' Lateral: 4.8 cm
Single Plane A2C EF: 43.9 %
Single Plane A4C EF: 43.9 %

## 2020-08-06 NOTE — Progress Notes (Signed)
Complete echocardiogram performed.  Jimmy Hildreth Robart RDCS, RVT  

## 2020-08-12 ENCOUNTER — Other Ambulatory Visit: Payer: Self-pay | Admitting: Oncology

## 2020-08-12 DIAGNOSIS — E119 Type 2 diabetes mellitus without complications: Secondary | ICD-10-CM | POA: Diagnosis not present

## 2020-08-12 DIAGNOSIS — D649 Anemia, unspecified: Secondary | ICD-10-CM | POA: Diagnosis not present

## 2020-08-12 DIAGNOSIS — I4891 Unspecified atrial fibrillation: Secondary | ICD-10-CM | POA: Diagnosis not present

## 2020-08-12 DIAGNOSIS — D509 Iron deficiency anemia, unspecified: Secondary | ICD-10-CM

## 2020-08-12 NOTE — Telephone Encounter (Signed)
Its fine to schedule next available appt with me APP. Thanks

## 2020-08-12 NOTE — Progress Notes (Signed)
[facility]  [address] [place],  [place]  [address] [phone]  Clinic Day:  08/13/2020  Referring physician: Ronita Hipps, MD   HISTORY OF PRESENT ILLNESS:  The patient is a 74 y.o. male with a history of iron deficiency anemia.  In the past, IV Feraheme was effective in normalizing his iron and hemoglobin levels.  The patient comes in today for repeat clinical assessment.  Since his last visit, the patient has been doing well.  He denies having increased fatigue or any overt forms of blood loss which concern him for recurrent iron deficiency anemia.  Of note, he had a CT scan in late October 2021, which showed no acute intra-abdominal pathology.  PHYSICAL EXAM:  Blood pressure (!) 154/75, pulse 72, temperature 97.8 F (36.6 C), resp. rate 16, height 5\' 9"  (1.753 m), weight 176 lb 4.8 oz (80 kg), SpO2 96 %. Wt Readings from Last 3 Encounters:  08/13/20 176 lb 4.8 oz (80 kg)  07/23/20 180 lb (81.6 kg)  07/17/20 185 lb (83.9 kg)   Body mass index is 26.03 kg/m. Performance status (ECOG): 1 - Symptomatic but completely ambulatory Physical Exam Constitutional:      Appearance: Normal appearance. He is not ill-appearing.  HENT:     Mouth/Throat:     Mouth: Mucous membranes are moist.     Pharynx: Oropharynx is clear. No oropharyngeal exudate or posterior oropharyngeal erythema.  Cardiovascular:     Rate and Rhythm: Normal rate and regular rhythm.     Heart sounds: No murmur heard.  No friction rub. No gallop.   Pulmonary:     Effort: Pulmonary effort is normal. No respiratory distress.     Breath sounds: Normal breath sounds. No wheezing, rhonchi or rales.  Abdominal:     General: Bowel sounds are normal. There is no distension.     Palpations: Abdomen is soft. There is no mass.     Tenderness: There is no abdominal tenderness.  Musculoskeletal:        General: No swelling.     Right lower leg: No edema.     Left lower leg: No edema.   Lymphadenopathy:     Cervical: No cervical adenopathy.     Upper Body:     Right upper body: No supraclavicular or axillary adenopathy.     Left upper body: No supraclavicular or axillary adenopathy.     Lower Body: No right inguinal adenopathy. No left inguinal adenopathy.  Skin:    General: Skin is warm.     Coloration: Skin is not jaundiced.     Findings: No lesion or rash.  Neurological:     General: No focal deficit present.     Mental Status: He is alert and oriented to person, place, and time. Mental status is at baseline.     Cranial Nerves: Cranial nerves are intact.  Psychiatric:        Mood and Affect: Mood normal.        Behavior: Behavior normal.        Thought Content: Thought content normal.     LABS:     ASSESSMENT & PLAN:  Assessment/Plan:  A 74 y.o. male with a history of iron deficiency anemia.  Based upon his labs today, his iron and hemoglobin levels remain ideal.  Of note, he has had a minimally low platelet count dating back to at least January 2020.  Overall, I do not get the sense any ominous hematologic  issue is present.  Clinically, the patient is doing well.  I do feel comfortable turning his care back over to his primary care office, with the recommendation that his CBC be checked 1-2 times per year.  I would not have a problem seeing him in the future if new hematologic issues arise that require repeat clinical assessment.  The patient understands all the plans discussed today and is in agreement with them.      Juan Kraynak Macarthur Critchley, MD

## 2020-08-13 ENCOUNTER — Encounter: Payer: Self-pay | Admitting: Oncology

## 2020-08-13 ENCOUNTER — Other Ambulatory Visit: Payer: Self-pay

## 2020-08-13 ENCOUNTER — Inpatient Hospital Stay: Payer: Medicare Other | Attending: Oncology

## 2020-08-13 ENCOUNTER — Other Ambulatory Visit: Payer: Self-pay | Admitting: Hematology and Oncology

## 2020-08-13 ENCOUNTER — Inpatient Hospital Stay (INDEPENDENT_AMBULATORY_CARE_PROVIDER_SITE_OTHER): Payer: Medicare Other | Admitting: Oncology

## 2020-08-13 VITALS — BP 154/75 | HR 72 | Temp 97.8°F | Resp 16 | Ht 69.0 in | Wt 176.3 lb

## 2020-08-13 DIAGNOSIS — D509 Iron deficiency anemia, unspecified: Secondary | ICD-10-CM | POA: Diagnosis not present

## 2020-08-13 DIAGNOSIS — D649 Anemia, unspecified: Secondary | ICD-10-CM | POA: Diagnosis not present

## 2020-08-13 DIAGNOSIS — Z0001 Encounter for general adult medical examination with abnormal findings: Secondary | ICD-10-CM | POA: Diagnosis not present

## 2020-08-13 LAB — IRON AND TIBC
Iron: 94 ug/dL (ref 45–182)
Saturation Ratios: 32 % (ref 17.9–39.5)
TIBC: 295 ug/dL (ref 250–450)
UIBC: 201 ug/dL

## 2020-08-13 LAB — CBC AND DIFFERENTIAL
HCT: 48 (ref 41–53)
Hemoglobin: 16.4 (ref 13.5–17.5)
Neutrophils Absolute: 7.46
Platelets: 128 — AB (ref 150–399)
WBC: 9.1

## 2020-08-13 LAB — CBC
MCV: 91
RBC: 5.25 — AB (ref 3.87–5.11)

## 2020-08-13 LAB — FERRITIN: Ferritin: 248 ng/mL (ref 24–336)

## 2020-08-14 NOTE — Telephone Encounter (Signed)
Scheduled to see Dr Silverio Decamp on 09/24/2020 at 8:10am  First available with Dr Silverio Decamp

## 2020-08-28 DIAGNOSIS — B351 Tinea unguium: Secondary | ICD-10-CM | POA: Diagnosis not present

## 2020-08-28 DIAGNOSIS — E119 Type 2 diabetes mellitus without complications: Secondary | ICD-10-CM | POA: Diagnosis not present

## 2020-09-11 DIAGNOSIS — E119 Type 2 diabetes mellitus without complications: Secondary | ICD-10-CM | POA: Diagnosis not present

## 2020-09-11 DIAGNOSIS — I4891 Unspecified atrial fibrillation: Secondary | ICD-10-CM | POA: Diagnosis not present

## 2020-09-11 DIAGNOSIS — D649 Anemia, unspecified: Secondary | ICD-10-CM | POA: Diagnosis not present

## 2020-09-11 DIAGNOSIS — R1084 Generalized abdominal pain: Secondary | ICD-10-CM | POA: Diagnosis not present

## 2020-09-18 ENCOUNTER — Telehealth: Payer: Self-pay | Admitting: Cardiology

## 2020-09-18 DIAGNOSIS — E088 Diabetes mellitus due to underlying condition with unspecified complications: Secondary | ICD-10-CM

## 2020-09-18 DIAGNOSIS — I4821 Permanent atrial fibrillation: Secondary | ICD-10-CM

## 2020-09-18 DIAGNOSIS — R0600 Dyspnea, unspecified: Secondary | ICD-10-CM

## 2020-09-18 DIAGNOSIS — R0609 Other forms of dyspnea: Secondary | ICD-10-CM

## 2020-09-18 DIAGNOSIS — I2510 Atherosclerotic heart disease of native coronary artery without angina pectoris: Secondary | ICD-10-CM

## 2020-09-18 MED ORDER — RANOLAZINE ER 1000 MG PO TB12: 1000.0000 mg | ORAL_TABLET | Freq: Two times a day (BID) | ORAL | 3 refills | Status: DC

## 2020-09-18 NOTE — Telephone Encounter (Signed)
°*  STAT* If patient is at the pharmacy, call can be transferred to refill team.   1. Which medications need to be refilled? (please list name of each medication and dose if known) Ranolazine ER 1000 mg  2. Which pharmacy/location (including street and city if local pharmacy) is medication to be sent to? [facility]   3. Do they need a 30 day or 90 day supply? [facility]

## 2020-09-19 ENCOUNTER — Telehealth: Payer: Self-pay | Admitting: Emergency Medicine

## 2020-09-19 DIAGNOSIS — R609 Edema, unspecified: Secondary | ICD-10-CM | POA: Diagnosis not present

## 2020-09-22 NOTE — Telephone Encounter (Signed)
Spoke with pt. He states that he did note have the sleep study done previously because he was having trouble with his breathing and wanted to wait until that was better. PT statest he found out he was low in iron and has received treatment and is feeling better. I advised pt that Dr Lamonte Sakai still wants him to have the sleep study. I offered to send this message to our schedulers but pt stated that he has the phone number to call back to schedule. Nothing further needed.

## 2020-09-24 ENCOUNTER — Ambulatory Visit: Payer: Medicare Other | Admitting: Gastroenterology

## 2020-09-25 DIAGNOSIS — R42 Dizziness and giddiness: Secondary | ICD-10-CM | POA: Diagnosis not present

## 2020-10-12 DIAGNOSIS — D649 Anemia, unspecified: Secondary | ICD-10-CM | POA: Diagnosis not present

## 2020-10-12 DIAGNOSIS — E119 Type 2 diabetes mellitus without complications: Secondary | ICD-10-CM | POA: Diagnosis not present

## 2020-10-12 DIAGNOSIS — I4891 Unspecified atrial fibrillation: Secondary | ICD-10-CM | POA: Diagnosis not present

## 2020-10-14 ENCOUNTER — Telehealth: Payer: Self-pay | Admitting: *Deleted

## 2020-10-14 ENCOUNTER — Encounter: Payer: Self-pay | Admitting: Gastroenterology

## 2020-10-14 ENCOUNTER — Ambulatory Visit (INDEPENDENT_AMBULATORY_CARE_PROVIDER_SITE_OTHER): Payer: Medicare Other | Admitting: Gastroenterology

## 2020-10-14 ENCOUNTER — Telehealth: Payer: Self-pay | Admitting: Gastroenterology

## 2020-10-14 VITALS — BP 106/54 | HR 56 | Ht 69.0 in | Wt 170.6 lb

## 2020-10-14 DIAGNOSIS — R140 Abdominal distension (gaseous): Secondary | ICD-10-CM

## 2020-10-14 DIAGNOSIS — R143 Flatulence: Secondary | ICD-10-CM

## 2020-10-14 DIAGNOSIS — R109 Unspecified abdominal pain: Secondary | ICD-10-CM

## 2020-10-14 DIAGNOSIS — R634 Abnormal weight loss: Secondary | ICD-10-CM | POA: Diagnosis not present

## 2020-10-14 MED ORDER — NA SULFATE-K SULFATE-MG SULF 17.5-3.13-1.6 GM/177ML PO SOLN: 345.0000 | Freq: Once | ORAL | 0 refills | Status: DC

## 2020-10-14 MED ORDER — HYOSCYAMINE SULFATE SL 0.125 MG SL SUBL: SUBLINGUAL_TABLET | SUBLINGUAL | 3 refills | Status: DC

## 2020-10-14 MED ORDER — NA SULFATE-K SULFATE-MG SULF 17.5-3.13-1.6 GM/177ML PO SOLN: 345.0000 | Freq: Once | ORAL | 0 refills | Status: AC

## 2020-10-14 NOTE — Telephone Encounter (Signed)
Dr Silverio Decamp can we substitute Hyoscyamine with something else, Robinul?     Suprep sent to [facility]

## 2020-10-14 NOTE — Telephone Encounter (Signed)
Inbound call from patient's wife stating scripts were sent to the wrong pharmacy; need to go to [facility] in [place].  Also states hyoscyamine sulfate medication is not covered under insurance and would like an alternate medication please.

## 2020-10-14 NOTE — Telephone Encounter (Signed)
Left message to call back.  Need to clarify is he is on aspirin/Plavix and Eliquis or just Plavix and Eliquis.   Kerin Ransom PA-C 10/14/2020 3:36 PM

## 2020-10-14 NOTE — Telephone Encounter (Signed)
[facility] Pre-operative Risk Assessment     Request for surgical clearance:     Endoscopy Procedure  What type of surgery is being performed?     Colon EGD   When is this surgery scheduled?     12/12/2020  What type of clearance is required ?   Pharmacy  Are there any medications that need to be held prior to surgery and how long? 2 days  Practice name and name of physician performing surgery?      [facility]  Dr Silverio Decamp   What is your office phone and fax number?      Phone- [phone]  [phone]  Anesthesia type (None, local, MAC, general) ?       MAC

## 2020-10-14 NOTE — Patient Instructions (Addendum)
STOP Dicyclomine   We will send Levsin to your pharmacy  You will be contacted by our office prior to your procedure for directions on holding your ELIQUIS.  If you do not hear from our office 1 week prior to your scheduled procedure, please call [phone] to discuss.    \You have been scheduled for an endoscopy and colonoscopy. Please follow the written instructions given to you at your visit today. Please pick up your prep supplies at the pharmacy within the next 1-3 days. If you use inhalers (even only as needed), please bring them with you on the day of your procedure.  Due to recent changes in healthcare laws, you may see the results of your imaging and laboratory studies on MyChart before your provider has had a chance to review them.  We understand that in some cases there may be results that are confusing or concerning to you. Not all laboratory results come back in the same time frame and the provider may be waiting for multiple results in order to interpret others.  Please give [place] 48 hours in order for your provider to thoroughly review all the results before contacting the office for clarification of your results.   Follow up in 3 months     Lactose-Free Diet, Adult If you have lactose intolerance, you are not able to digest lactose. Lactose is a natural sugar found mainly in dairy milk and dairy products. You may need to avoid all foods and beverages that contain lactose. A lactose-free diet can help you do this. Which foods have lactose? Lactose is found in dairy milk and dairy products, such as:  Yogurt.  Cheese.  Butter.  Margarine.  Sour cream.  Cream.  Whipped toppings and nondairy creamers.  Ice cream and other dairy-based desserts. Lactose is also found in foods or products made with dairy milk or milk ingredients. To find out whether a food contains dairy milk or a milk ingredient, look at the ingredients list. Avoid foods with the statement "May contain  milk" and foods that contain:  Milk powder.  Whey.  Curd.  Caseinate.  Lactose.  Lactalbumin.  Lactoglobulin. What are alternatives to dairy milk and foods made with milk products?  Lactose-free milk.  Soy milk with added calcium and vitamin D.  Almond milk, coconut milk, rice milk, or other nondairy milk alternatives with added calcium and vitamin D. Note that these are low in protein.  Soy products, such as soy yogurt, soy cheese, soy ice cream, and soy-based sour cream.  Other nut milk products, such as almond yogurt, almond cheese, cashew yogurt, cashew cheese, cashew ice cream, coconut yogurt, and coconut ice cream. What are tips for following this plan?  Do not consume foods, beverages, vitamins, minerals, or medicines containing lactose. Read ingredient lists carefully.  Look for the words "lactose-free" on labels.  Use lactase enzyme drops or tablets as directed by your health care provider.  Use lactose-free milk or a milk alternative, such as soy milk or almond milk, for drinking and cooking.  Make sure you get enough calcium and vitamin D in your diet. A lactose-free eating plan can be lacking in these important nutrients.  Take calcium and vitamin D supplements as directed by your health care provider. Talk to your health care provider about supplements if you are not able to get enough calcium and vitamin D from food. What foods can I eat? Fruits All fresh, canned, frozen, or dried fruits that are not processed with lactose.  Vegetables All fresh, frozen, and canned vegetables without cheese, cream, or butter sauces. Grains Any that are not made with dairy milk or dairy products. Meats and other proteins Any meat, fish, poultry, and other protein sources that are not made with dairy milk or dairy products. Soy cheese and yogurt. Fats and oils Any that are not made with dairy milk or dairy products. Beverages Lactose-free milk. Soy, rice, or almond milk  with added calcium and vitamin D. Fruit and vegetable juices. Sweets and desserts Any that are not made with dairy milk or dairy products. Seasonings and condiments Any that are not made with dairy milk or dairy products. Calcium Calcium is found in many foods that contain lactose and is important for bone health. The amount of calcium you need depends on your age:  Adults younger than 50 years: 1,000 mg of calcium a day.  Adults older than 50 years: 1,200 mg of calcium a day. If you are not getting enough calcium, you may get it from other sources, including:  Orange juice with calcium added. There are 300-350 mg of calcium in 1 cup of orange juice.  Calcium-fortified soy milk. There are 300-400 mg of calcium in 1 cup of calcium-fortified soy milk.  Calcium-fortified rice or almond milk. There are 300 mg of calcium in 1 cup of calcium-fortified rice or almond milk.  Calcium-fortified breakfast cereals. There are 100-1,000 mg of calcium in calcium-fortified breakfast cereals.  Spinach, cooked. There are 145 mg of calcium in  cup of cooked spinach.  Edamame, cooked. There are 130 mg of calcium in  cup of cooked edamame.  Collard greens, cooked. There are 125 mg of calcium in  cup of cooked collard greens.  Kale, frozen or cooked. There are 90 mg of calcium in  cup of cooked or frozen kale.  Almonds. There are 95 mg of calcium in  cup of almonds.  Broccoli, cooked. There are 60 mg of calcium in 1 cup of cooked broccoli. The items listed above may not be a complete list of recommended foods and beverages. Contact a dietitian for more options.   What foods are not recommended? Fruits None, unless they are made with dairy milk or dairy products. Vegetables None, unless they are made with dairy milk or dairy products. Grains Any grains that are made with dairy milk or dairy products. Meats and other proteins None, unless they are made with dairy milk or dairy  products. Dairy All dairy products, including milk, goat's milk, buttermilk, kefir, acidophilus milk, flavored milk, evaporated milk, condensed milk, dulce de Kennan, eggnog, yogurt, cheese, and cheese spreads. Fats and oils Any that are made with milk or milk products. Margarines and salad dressings that contain milk or cheese. Cream. Half and half. Cream cheese. Sour cream. Chip dips made with sour cream or yogurt. Beverages Hot chocolate. Cocoa with lactose. Instant iced teas. Powdered fruit drinks. Smoothies made with dairy milk or yogurt. Sweets and desserts Any that are made with milk or milk products. Seasonings and condiments Chewing gum that has lactose. Spice blends if they contain lactose. Artificial sweeteners that contain lactose. Nondairy creamers. The items listed above may not be a complete list of foods and beverages to avoid. Contact a dietitian for more information. Summary  If you are lactose intolerant, it means that you have a hard time digesting lactose, a natural sugar found in milk and milk products.  Following a lactose-free diet can help you manage this condition.  Calcium is important  for bone health and is found in many foods that contain lactose. Talk with your health care provider about other sources of calcium. This information is not intended to replace advice given to you by your health care provider. Make sure you discuss any questions you have with your health care provider. Document Revised: 09/27/2017 Document Reviewed: 09/27/2017 Elsevier Patient Education  2021 [employer].   I appreciate the  opportunity to care for you  Thank You   Harl Bowie , MD

## 2020-10-14 NOTE — Telephone Encounter (Signed)
Patient with diagnosis of afib on Eliquis for anticoagulation.    Procedure: colonoscopy/EGD Date of procedure: 12/12/2020  CHA2DS2-VASc Score = 6  This indicates a 9.7% annual risk of stroke. The patient's score is based upon: CHF History: Yes HTN History: Yes Diabetes History: Yes Stroke History: No Vascular Disease History: Yes Age Score: 2 Gender Score: 0     CrCl 46 ml/min Platelet count 128  Per office protocol, patient can hold Eliquis for 2 days prior to procedure.

## 2020-10-14 NOTE — Telephone Encounter (Signed)
Pharmacy please make recommendations on holding Eliquis in this patient and then will contact the patient and relay those recommendations.  I will discuss holding Plavix with the patient's primary cardiologist.  Kerin Ransom PA-C 10/14/2020 11:48 AM

## 2020-10-14 NOTE — Telephone Encounter (Signed)
Patient can go off Plavix as it is more than 1 year since his stent however I prefer that he stays on a coated baby aspirin uninterrupted.  Based on my notes his stent was in 2020.

## 2020-10-14 NOTE — Progress Notes (Signed)
Juan Valdez    [id number]    21-Aug-1946  Primary Care Physician:Holt, Gara Kroner, MD  Referring Physician: Ronita Hipps, MD [place] [address],    Chief complaint:  Abdominal pain,   HPI:  75 yr old very pleasant gentleman here for follow-up visit for abdominal pain, he was previously seen by Dr. Rush Landmark He continues to have generalized abdominal pain.  He moved here from [place].  He had endoscopic evaluation on small bowel video capsule for iron deficiency anemia and has history of AVMs. He has irregular bowel habits.  He also has history of inguinal hernia, he was hospitalized with partial small bowel obstruction in 2019. He has generalized abdominal cramping, he is taking dicyclomine with some improvement but does not completely relieve it He is not able to maintain his weight, has not lost significant weight in the past few months. No vomiting, rectal bleeding or melena.  Relevant GI Hx: February 2019 CT enterography Duodenal diverticulum.  Small bowel is otherwise unremarkable without evidence of segmental wall thickening or obstruction. Extensive colonic diverticulosis. Fat-containing inguinal hernia  June 2019 gastric emptying study There is minimal delay after 4 hours of gastric emptying the remainder of the study is within normal limits. At 1 hour 78.1% remained in the stomach.  At 2 hours 53.4% remained in the stomach.  At 3 hours 28.2% remained in the stomach.  At 4 hours 10.7% remained in the stomach.  The upper limits of normal at 4 hours is approximately 10%.  Thus this shows a minimal delay  Patient was seen by Dr. Berton Lan in [facility] in September 2018 for follow-up of iron deficiency anemia and abdominal pain and bowel habit changes.  Patient's impression at the time was for iron deficiency anemia felt attributed to AVMs on oral iron and prior IV iron and followed with hematology. 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 Adenomatous colon polyp removed in 2017 and then again in 2018 but prep was suboptimal. Small bowel AVMs not visualized on prior endoscopies reported on capsule studies. CT scan abnormalities with marginal elevation alkaline phosphatase and previously seen by surgery but he was not a surgical candidate. Anal seepage with concern for constipation and fecal impaction. Fatty liver/[place] cold with concern on prior imaging though January 2018 CT scan suggested no fatty liver. Plan was for MiraLAX and Dulcolax and modification of diet as necessary.  Lab slip given for further work-up of liver test abnormalities. Patient admitted to the hospital in December with possible acute enteritis and partial small bowel obstruction.  Plan was for outpatient CT enterography after completion of antibiotics. January 2019 visit in clinic noted that patient was felt to have possible acute infectious enteritis versus angioedema related to our less likely Crohn's.  Outpatient Encounter Medications as of 10/14/2020  Medication Sig  . ALAWAY 0.025 % ophthalmic solution Place 1 drop into both eyes 2 (two) times daily as needed (allergy/irritated eyes.).  Marland Kitchen albuterol (PROVENTIL) (2.5 MG/3ML) 0.083% nebulizer solution Take 3 mLs (2.5 mg total) by nebulization every 6 (six) hours as needed for up to 4 doses for wheezing or shortness of breath.  . allopurinol (ZYLOPRIM) 300 MG tablet Take 300 mg by mouth daily.  . Ascorbic Acid (VITAMIN C) 1000 MG tablet Take 1,000 mg by mouth daily.  Marland Kitchen atorvastatin (LIPITOR) 40 MG tablet Take 40 mg by mouth every evening.   Marland Kitchen  Calcium Carb-Cholecalciferol (CALCIUM 600+D3 PO) Take 1 tablet by mouth daily.  . Cholecalciferol (VITAMIN D3) 50 MCG (2000 UT) TABS Take 2,000 Units by mouth daily.  Marland Kitchen ELIQUIS 5 MG TABS tablet TAKE 1 TABLET BY MOUTH TWICE DAILY  . Fluticasone-Umeclidin-Vilant (TRELEGY ELLIPTA) 100-62.5-25 MCG/INH AEPB Inhale 1 puff into the  lungs daily.  Marland Kitchen glipiZIDE (GLUCOTROL XL) 2.5 MG 24 hr tablet Take 2.5 mg by mouth daily with breakfast.  . HOMEOPATHIC PRODUCTS EX Apply 1 application topically 2 (two) times daily as needed (diabetic pain.). Topricin Foot Pain Relief Cream  . Hyoscyamine Sulfate SL (LEVSIN/SL) 0.125 MG SUBL 1 tablet three times a day as needed  . ipratropium (ATROVENT) 0.03 % nasal spray Place 2 sprays into both nostrils every 12 (twelve) hours as needed for rhinitis.  . metoprolol succinate (TOPROL-XL) 25 MG 24 hr tablet TAKE 1/2 TABLET BY MOUTH EVERY MORNING  . nitroGLYCERIN (NITROSTAT) 0.4 MG SL tablet DISSOLVE 1 TABLET UNDER THE TONGUE EVERY 5 MINUTES AS NEEDED FOR CHEST PAIN  . Omega-3 Fatty Acids (FISH OIL) 1200 MG CAPS Take 1,200 mg by mouth daily.  Vladimir Faster Glycol-Propyl Glycol (SYSTANE) 0.4-0.3 % SOLN Place 1 drop into both eyes 2 (two) times daily as needed (dry/irritated eyes).  . potassium gluconate 595 (99 K) MG TABS tablet Take 595 mg by mouth every other day.  Marland Kitchen PROAIR HFA 108 (90 Base) MCG/ACT inhaler Inhale 2 puffs into the lungs every 2 (two) hours as needed.  . ranolazine (RANEXA) 1000 MG SR tablet Take 1 tablet (1,000 mg total) by mouth 2 (two) times daily.  . RESTASIS 0.05 % ophthalmic emulsion Place 1 drop into both eyes every 12 (twelve) hours.  . tamsulosin (FLOMAX) 0.4 MG CAPS capsule Take 0.4 mg by mouth daily after supper.   . traZODone (DESYREL) 150 MG tablet Take 150 mg by mouth at bedtime.   . triamcinolone cream (KENALOG) 0.1 % Apply 1 application topically daily.  . [DISCONTINUED] Na Sulfate-K Sulfate-Mg Sulf 17.5-3.13-1.6 GM/177ML SOLN Take 345 Bottles by mouth once for 1 dose.  . metolazone (ZAROXOLYN) 2.5 MG tablet Take 1 tablet (2.5 mg total) by mouth daily.  . [DISCONTINUED] albuterol (PROVENTIL) (2.5 MG/3ML) 0.083% nebulizer solution Take 3 mLs (2.5 mg total) by nebulization every 6 (six) hours as needed for wheezing or shortness of breath. (Patient not taking: Reported on  10/14/2020)  . [DISCONTINUED] clopidogrel (PLAVIX) 75 MG tablet Take 1 tablet (75 mg total) by mouth daily. (Patient not taking: Reported on 10/14/2020)  . [DISCONTINUED] HYDROcodone-acetaminophen (HYCET) 7.5-325 mg/15 ml solution Take 1 mL by mouth 2 (two) times daily as needed for pain. (Patient not taking: Reported on 10/14/2020)   No facility-administered encounter medications on file as of 10/14/2020.    Allergies as of 10/14/2020  . (No Known Allergies)    Past Medical History:  Diagnosis Date  . Abdominal bloating 05/06/2019  . Abdominal distension 05/06/2019  . Abnormal nuclear cardiac imaging test 08/22/2019  . 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 Allergic rhinitis 05/29/2019  . Anemia   . Atrial fibrillation ([place]) 09/07/2018  . Bilateral recurrent inguinal hernia without obstruction or gangrene 05/06/2019  . CAD (coronary artery disease)    a. s/p prior DES to RCA b. cath in 07/2019 showing 75% Prox to mid RCA stenosis, 5% ISR along previously placed distal RCA stent, 85% Prox LCx stenosis, and 100% CTO of distal LCx with filled from L--> L collaterals. Also had a long-diffuse 65-75% stenosis along proximal to mid-LAD. Unable  to fully expand balloon for PCI of RCA and lesion reduced to 65%. Future atherectotmy of RCA and LCx.   Marland Kitchen CAD S/P percutaneous coronary angioplasty 07/20/2019  . CHF (congestive heart failure) ([place]) 11/27/2019  . Colon polyp   . COPD (chronic obstructive pulmonary disease) ([place])   . COPD with acute exacerbation ([place]) 06/14/2019  . Coronary artery disease involving native coronary artery of native heart with angina pectoris ([place]) 07/20/2019  . Diabetes ([place])   . Diabetes mellitus due to underlying condition with unspecified complications ([place]) 38/75/6433  . Diverticulosis   . DOE (dyspnea on exertion) 07/13/2019  . Essential hypertension 09/07/2018  . 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 Generalized abdominal pain 05/06/2019  . GERD (gastroesophageal reflux disease)   . Gout   . HLD (hyperlipidemia)   . 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 Hx of colonic polyps 05/06/2019  . Irritable bowel syndrome with constipation 05/06/2019  . Lung cancer ([place])   . Medication management 01/04/2019  . Mixed dyslipidemia 09/07/2018  . 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 Onychomycosis due to dermatophyte 11/16/2018  . Orthopnea 01/04/2019  . Perforated bowel ([place])   . Permanent atrial fibrillation ([place]) 09/07/2018  . SBO (small bowel obstruction) ([place]) 05/08/2019  . Shortness of breath 07/08/2019    Past Surgical History:  Procedure Laterality Date  . ABDOMINAL SURGERY    . CORONARY ANGIOPLASTY WITH STENT PLACEMENT  2003  . CORONARY ATHERECTOMY N/A 08/22/2019   Procedure: CORONARY ATHERECTOMY;  Surgeon: Leonie Man, MD;  Location: [facility];  Service: Cardiovascular;  Laterality: N/A;  . CORONARY BALLOON ANGIOPLASTY N/A 07/20/2019   Procedure: CORONARY BALLOON ANGIOPLASTY;  Surgeon: Leonie Man, MD;  Location: [facility];  Service: Cardiovascular;  Laterality: N/A;  RCA  . CORONARY BALLOON ANGIOPLASTY N/A 08/22/2019   Procedure: CORONARY BALLOON ANGIOPLASTY;  Surgeon: Leonie Man, MD;  Location: [facility];  Service: Cardiovascular;  Laterality: N/A;  . CORONARY STENT INTERVENTION  08/22/2019  . CORONARY STENT INTERVENTION N/A 08/22/2019   Procedure: CORONARY STENT INTERVENTION;  Surgeon: Leonie Man, MD;  Location: [facility];  Service: Cardiovascular;  Laterality: N/A;  . LEFT HEART CATH AND CORONARY ANGIOGRAPHY N/A 07/20/2019   Procedure: LEFT HEART CATH AND CORONARY ANGIOGRAPHY;  Surgeon: Leonie Man, MD;  Location: [facility];  Service: Cardiovascular;  Laterality: N/A;  . LEFT HEART CATH AND CORONARY ANGIOGRAPHY N/A 08/22/2019    Procedure: LEFT HEART CATH AND CORONARY ANGIOGRAPHY;  Surgeon: Leonie Man, MD;  Location: [facility];  Service: Cardiovascular;  Laterality: N/A;  . LUNG CANCER SURGERY    . ROTATOR CUFF REPAIR Left   . TEMPORARY PACEMAKER N/A 08/22/2019   Procedure: TEMPORARY PACEMAKER;  Surgeon: Leonie Man, MD;  Location: [facility];  Service: Cardiovascular;  Laterality: N/A;  . TONSILLECTOMY     age 34    Family History  Adopted: Yes    Social History   Socioeconomic History  . Marital status: Widowed    Spouse name: Not on file  . Number of children: 0  . Years of education: Not on file  . Highest education level: Not on file  Occupational History  . Occupation: retired  Tobacco Use  . Smoking status: Former Smoker  Packs/day: 3.00    Years: 57.00    Pack years: 171.00    Types: Cigarettes    Start date: 32    Quit date: 06/13/2014    Years since quitting: 6.3  . Smokeless tobacco: Never Used  Vaping Use  . Vaping Use: Never used  Substance and Sexual Activity  . Alcohol use: Not Currently  . Drug use: Never  . Sexual activity: Not on file  Other Topics Concern  . Not on file  Social History Narrative  . Not on file   Social Determinants of Health   Financial Resource Strain: Not on file  Food Insecurity: Not on file  Transportation Needs: Not on file  Physical Activity: Not on file  Stress: Not on file  Social Connections: Not on file  Intimate Partner Violence: Not on file      Review of systems: All other review of systems negative except as mentioned in the HPI.   Physical Exam: Vitals:   10/14/20 1021  BP: (!) 106/54  Pulse: (!) 56   Body mass index is 25.19 kg/m. Gen:      No acute distress HEENT:  sclera anicteric Abd:      soft, non-tender; no palpable masses, no distension Ext:    No edema Neuro: alert and oriented x 3 Psych: normal mood and affect  Data Reviewed:  Reviewed labs, radiology imaging, old records and  pertinent past GI work up   Assessment and Plan/Recommendations:  75 year old very pleasant gentleman with history of CAD, A. 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 He is not able to maintain his weight, progressively losing weight.  He has had extensive GI work-up in the past unrevealing to explain persistent abdominal pain. He had adenomatous duodenal polyp removed in 2018 and he also has history of adenomatous colon polyps. We will plan to proceed with EGD and colonoscopy for further evaluation The risks and benefits as well as alternatives of endoscopic procedure(s) have been discussed and reviewed. All questions answered. The patient agrees to proceed.  Trial of lactose-free diet for 1 to 2 weeks to see if he has any improvement of his symptoms, evaluate for possible lactose intolerance  We will plan to switch to Levsin from dicyclomine if covered by insurance, if not continue dicyclomine 20 mg every 8 hours as needed  We will request clearance from cardiology to hold Eliquis for 2 days prior to the procedure  This visit required 40 minutes of patient care (this includes precharting, chart review, review of results, face-to-face time used for counseling as well as treatment plan and follow-up. The patient was provided an opportunity to ask questions and all were answered. The patient agreed with the plan and demonstrated an understanding of the instructions.  Damaris Hippo , MD    CC: Ronita Hipps, MD

## 2020-10-14 NOTE — Telephone Encounter (Signed)
Dr Geraldo Pitter we need recommendations on holding Plavix in this patient pre colonoscopy and EGD.  Please reply to   CV DIV PRE OP  Thanks   Kerin Ransom PA-C 10/14/2020 11:49 AM

## 2020-10-15 NOTE — Telephone Encounter (Signed)
Please check if Dicylomine 20mg  q8h prn is covered instead? Thank you

## 2020-10-16 MED ORDER — DICYCLOMINE HCL 20 MG PO TABS: 20.0000 mg | ORAL_TABLET | Freq: Three times a day (TID) | ORAL | 0 refills | Status: AC | PRN

## 2020-10-16 NOTE — Telephone Encounter (Signed)
Sent in dicyclomine to patients pharmacy to see if covered by his insurance    Dr Silverio Decamp I called patient to inform him that dicyclomine was sent and his wife said that's what he was already taking  He took the 10mg  and the 20 mg.......Juan Valdez

## 2020-10-20 ENCOUNTER — Telehealth: Payer: Self-pay | Admitting: Cardiology

## 2020-10-20 ENCOUNTER — Other Ambulatory Visit: Payer: Self-pay

## 2020-10-20 NOTE — Telephone Encounter (Signed)
New Message:    Juan Valdez said she had to call EMS this morning. Pt's Heart Rate was 38. They came and told her to call pt's Cardiologist with his readings.   STAT if HR is under 50 or over 120 (normal HR is 60-100 beats per minute)  1) What is your heart rate? 60  The highest was 49 when she called EMS 60 when EMS came  2) Do you have a log of your heart rate readings (document readings)?118/70 sugar was 147 and oxygen 98  3) Do you have any other symptoms? He was dizzy, clammy, shortness of breath anyway and dry heaves

## 2020-10-20 NOTE — Telephone Encounter (Signed)
Pt's wife states that EMS came out and saw the pt. Pt was not taken to the ED but advised to call and make an appointment. Appointment made with Dr. Geraldo Pitter for 10/21/20. Advised to call 911 for any additional issues. Wife verbalized understanding and had no additional concerns or questions.Marland Kitchen

## 2020-10-21 ENCOUNTER — Other Ambulatory Visit: Payer: Self-pay

## 2020-10-21 ENCOUNTER — Encounter: Payer: Self-pay | Admitting: Cardiology

## 2020-10-21 ENCOUNTER — Ambulatory Visit (INDEPENDENT_AMBULATORY_CARE_PROVIDER_SITE_OTHER): Payer: Medicare Other | Admitting: Cardiology

## 2020-10-21 ENCOUNTER — Telehealth: Payer: Self-pay

## 2020-10-21 VITALS — BP 116/66 | HR 60 | Ht 69.0 in | Wt 169.8 lb

## 2020-10-21 DIAGNOSIS — I10 Essential (primary) hypertension: Secondary | ICD-10-CM

## 2020-10-21 DIAGNOSIS — I25119 Atherosclerotic heart disease of native coronary artery with unspecified angina pectoris: Secondary | ICD-10-CM

## 2020-10-21 DIAGNOSIS — I4821 Permanent atrial fibrillation: Secondary | ICD-10-CM

## 2020-10-21 DIAGNOSIS — E782 Mixed hyperlipidemia: Secondary | ICD-10-CM | POA: Diagnosis not present

## 2020-10-21 LAB — BASIC METABOLIC PANEL
BUN/Creatinine Ratio: 14 (ref 10–24)
BUN: 22 mg/dL (ref 8–27)
CO2: 29 mmol/L (ref 20–29)
Calcium: 10.2 mg/dL (ref 8.6–10.2)
Chloride: 96 mmol/L (ref 96–106)
Creatinine, Ser: 1.52 mg/dL — ABNORMAL HIGH (ref 0.76–1.27)
GFR calc Af Amer: 51 mL/min/{1.73_m2} — ABNORMAL LOW (ref >59)
GFR calc non Af Amer: 44 mL/min/{1.73_m2} — ABNORMAL LOW (ref >59)
Glucose: 119 mg/dL — ABNORMAL HIGH (ref 65–99)
Potassium: 4.1 mmol/L (ref 3.5–5.2)
Sodium: 140 mmol/L (ref 134–144)

## 2020-10-21 MED ORDER — METOLAZONE 2.5 MG PO TABS: 2.5000 mg | ORAL_TABLET | ORAL | 3 refills | Status: DC

## 2020-10-21 NOTE — Telephone Encounter (Signed)
4 boxes of Eliquis given to pt per Dr. Geraldo Pitter Lot # [id number] Exp 11/23

## 2020-10-21 NOTE — Progress Notes (Signed)
Cardiology Office Note:    Date:  10/21/2020   ID:  Juan Valdez, DOB 1946/06/15, MRN [id number]  PCP:  Ronita Hipps, MD  Cardiologist:  Jenean Lindau, MD   Referring MD: Ronita Hipps, MD    ASSESSMENT:    1. Permanent atrial fibrillation ([place])   2. Mixed hyperlipidemia   3. Essential hypertension   4. Coronary artery disease involving native coronary artery of native heart with angina pectoris ([place])   5. Mixed dyslipidemia    PLAN:    In order of problems listed above:  1. Coronary artery disease: Secondary prevention stressed with the patient.  Importance of compliance with diet medication stressed any vocalized understanding. 2. Orthostatic hypotension: His symptoms suggest orthostatic hypotension.  I have cut down his metolazone now to every other day.  He will keeps himself fairly well-hydrated and keep a track of his pulse and blood pressures.  I will see him in follow-up appointment in 8 to 10 days.  He will have a Chem-7 today. 3. Mixed dyslipidemia: Diet was emphasized and he promises to do better with this. 4. Permanent atrial fibrillation:I discussed with the patient atrial fibrillation, disease process. Management and therapy including rate and rhythm control, anticoagulation benefits and potential risks were discussed extensively with the patient. Patient had multiple questions which were answered to patient's satisfaction. 5. Diabetes mellitus: Managed by primary care physician and diet emphasized. 6. Patient will be seen in follow-up appointment in 6 months or earlier if the patient has any concerns    Medication Adjustments/Labs and Tests Ordered: Current medicines are reviewed at length with the patient today.  Concerns regarding medicines are outlined above.  No orders of the defined types were placed in this encounter.  No orders of the defined types were placed in this encounter.    No chief complaint on file.    History of Present Illness:     Juan Valdez is a 75 y.o. male.  Patient has past medical history of coronary artery disease post stenting, essential hypertension mixed dyslipidemia and diabetes mellitus.  He has stable angina and permanent atrial fibrillation.  He denies any problems at this time and takes care of activities of daily living.  No chest pain orthopnea or PND.  At the time of my evaluation, the patient is alert awake oriented and in no distress.  The patient mentions to me that yesterday he had breakfast and subsequently went up to pick up his hearing aids.  He lifted his hands up to pick up his hearing aids and felt dizzy and sat down.  And that he laid down.  His blood pressure was in the 60s subsequently and he laid down and rested.  Subsequently EMS was called and they found his blood pressure and heart rate to be fine.  This was their concern.  No syncope.  This is the 1st time this happened.  At the time of my evaluation, the patient is alert awake oriented and in no distress.  Past Medical History:  Diagnosis Date  . Abdominal bloating 05/06/2019  . Abdominal distension 05/06/2019  . Abnormal nuclear cardiac imaging test 08/22/2019  . 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 Allergic rhinitis 05/29/2019  . Anemia   . Atrial fibrillation ([place]) 09/07/2018  . Bilateral recurrent inguinal hernia without obstruction or gangrene 05/06/2019  . CAD (coronary artery disease)    a. s/p prior DES to RCA b. cath in 07/2019 showing 75% Prox to mid RCA stenosis,  5% ISR along previously placed distal RCA stent, 85% Prox LCx stenosis, and 100% CTO of distal LCx with filled from L--> L collaterals. Also had a long-diffuse 65-75% stenosis along proximal to mid-LAD. Unable to fully expand balloon for PCI of RCA and lesion reduced to 65%. Future atherectotmy of RCA and LCx.   Marland Kitchen CAD S/P percutaneous coronary angioplasty 07/20/2019  . CHF (congestive heart failure) ([place]) 11/27/2019  . Colon polyp   . COPD (chronic obstructive  pulmonary disease) ([place])   . COPD with acute exacerbation ([place]) 06/14/2019  . Coronary artery disease involving native coronary artery of native heart with angina pectoris ([place]) 07/20/2019  . Diabetes ([place])   . Diabetes mellitus due to underlying condition with unspecified complications ([place]) 18/29/9371  . Diverticulosis   . DOE (dyspnea on exertion) 07/13/2019  . Essential hypertension 09/07/2018  . Excessive daytime sleepiness 06/09/2020  . 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 Generalized abdominal pain 05/06/2019  . GERD (gastroesophageal reflux disease)   . Gout   . HLD (hyperlipidemia)   . 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 Hx of colonic polyps 05/06/2019  . Irritable bowel syndrome with constipation 05/06/2019  . Lung cancer ([place])   . Medication management 01/04/2019  . Mixed dyslipidemia 09/07/2018  . Non-small cell cancer of right lung ([place]) 11/23/2018  . Onychomycosis due to dermatophyte 11/16/2018  . Orthopnea 01/04/2019  . Perforated bowel ([place])   . Permanent atrial fibrillation ([place]) 09/07/2018  . SBO (small bowel obstruction) ([place]) 05/08/2019  . Shortness of breath 07/08/2019    Past Surgical History:  Procedure Laterality Date  . ABDOMINAL SURGERY    . CORONARY ANGIOPLASTY WITH STENT PLACEMENT  2003  . CORONARY ATHERECTOMY N/A 08/22/2019   Procedure: CORONARY ATHERECTOMY;  Surgeon: Leonie Man, MD;  Location: [facility];  Service: Cardiovascular;  Laterality: N/A;  . CORONARY BALLOON ANGIOPLASTY N/A 07/20/2019   Procedure: CORONARY BALLOON ANGIOPLASTY;  Surgeon: Leonie Man, MD;  Location: [facility];  Service: Cardiovascular;  Laterality: N/A;  RCA  . CORONARY BALLOON ANGIOPLASTY N/A 08/22/2019   Procedure: CORONARY BALLOON ANGIOPLASTY;  Surgeon: Leonie Man, MD;  Location: [facility];   Service: Cardiovascular;  Laterality: N/A;  . CORONARY STENT INTERVENTION  08/22/2019  . CORONARY STENT INTERVENTION N/A 08/22/2019   Procedure: CORONARY STENT INTERVENTION;  Surgeon: Leonie Man, MD;  Location: [facility];  Service: Cardiovascular;  Laterality: N/A;  . LEFT HEART CATH AND CORONARY ANGIOGRAPHY N/A 07/20/2019   Procedure: LEFT HEART CATH AND CORONARY ANGIOGRAPHY;  Surgeon: Leonie Man, MD;  Location: [facility];  Service: Cardiovascular;  Laterality: N/A;  . LEFT HEART CATH AND CORONARY ANGIOGRAPHY N/A 08/22/2019   Procedure: LEFT HEART CATH AND CORONARY ANGIOGRAPHY;  Surgeon: Leonie Man, MD;  Location: [facility];  Service: Cardiovascular;  Laterality: N/A;  . LUNG CANCER SURGERY    . ROTATOR CUFF REPAIR Left   . TEMPORARY PACEMAKER N/A 08/22/2019   Procedure: TEMPORARY PACEMAKER;  Surgeon: Leonie Man, MD;  Location: [facility];  Service: Cardiovascular;  Laterality: N/A;  . TONSILLECTOMY     age 40    Current Medications: Current Meds  Medication Sig  . ALAWAY 0.025 % ophthalmic solution Place 1 drop into both eyes 2 (two) times daily as  needed (allergy/irritated eyes.).  Marland Kitchen albuterol (PROVENTIL) (2.5 MG/3ML) 0.083% nebulizer solution Take 3 mLs (2.5 mg total) by nebulization every 6 (six) hours as needed for up to 4 doses for wheezing or shortness of breath.  . allopurinol (ZYLOPRIM) 300 MG tablet Take 300 mg by mouth daily.  . Ascorbic Acid (VITAMIN C) 1000 MG tablet Take 1,000 mg by mouth daily.  Marland Kitchen atorvastatin (LIPITOR) 40 MG tablet Take 40 mg by mouth every evening.   . Cholecalciferol (VITAMIN D3) 50 MCG (2000 UT) TABS Take 2,000 Units by mouth daily.  Marland Kitchen dicyclomine (BENTYL) 20 MG tablet Take 1 tablet (20 mg total) by mouth every 8 (eight) hours as needed for spasms.  Marland Kitchen ELIQUIS 5 MG TABS tablet TAKE 1 TABLET BY MOUTH TWICE DAILY  . Fluticasone-Umeclidin-Vilant (TRELEGY ELLIPTA) 100-62.5-25 MCG/INH AEPB Inhale 1 puff  into the lungs daily.  Marland Kitchen glipiZIDE (GLUCOTROL XL) 2.5 MG 24 hr tablet Take 2.5 mg by mouth daily with breakfast.  . HOMEOPATHIC PRODUCTS EX Apply 1 application topically 2 (two) times daily as needed (diabetic pain.). Topricin Foot Pain Relief Cream  . ipratropium (ATROVENT) 0.03 % nasal spray Place 2 sprays into both nostrils every 12 (twelve) hours as needed for rhinitis.  . metoprolol succinate (TOPROL-XL) 25 MG 24 hr tablet TAKE 1/2 TABLET BY MOUTH EVERY MORNING  . nitroGLYCERIN (NITROSTAT) 0.4 MG SL tablet DISSOLVE 1 TABLET UNDER THE TONGUE EVERY 5 MINUTES AS NEEDED FOR CHEST PAIN  . Omega-3 Fatty Acids (FISH OIL) 1200 MG CAPS Take 1,200 mg by mouth daily.  Marland Kitchen omeprazole (PRILOSEC) 20 MG capsule Take 20 mg by mouth 2 (two) times daily.  . potassium gluconate 595 (99 K) MG TABS tablet Take 595 mg by mouth daily.  Marland Kitchen PROAIR HFA 108 (90 Base) MCG/ACT inhaler Inhale 2 puffs into the lungs every 2 (two) hours as needed for shortness of breath or wheezing.  . ranolazine (RANEXA) 1000 MG SR tablet Take 1 tablet (1,000 mg total) by mouth 2 (two) times daily.  . RESTASIS 0.05 % ophthalmic emulsion Place 1 drop into both eyes every 12 (twelve) hours.  . tamsulosin (FLOMAX) 0.4 MG CAPS capsule Take 0.4 mg by mouth daily after supper.   . traMADol (ULTRAM) 50 MG tablet Take 50 mg by mouth every 6 (six) hours as needed for pain.  . traZODone (DESYREL) 150 MG tablet Take 150 mg by mouth at bedtime.   . triamcinolone cream (KENALOG) 0.1 % Apply 1 application topically daily.     Allergies:   Patient has no known allergies.   Social History   Socioeconomic History  . Marital status: Widowed    Spouse name: Not on file  . Number of children: 0  . Years of education: Not on file  . Highest education level: Not on file  Occupational History  . Occupation: retired  Tobacco Use  . Smoking status: Former Smoker    Packs/day: 3.00    Years: 57.00    Pack years: 171.00    Types: Cigarettes    Start  date: 41    Quit date: 06/13/2014    Years since quitting: 6.3  . Smokeless tobacco: Never Used  Vaping Use  . Vaping Use: Never used  Substance and Sexual Activity  . Alcohol use: Not Currently  . Drug use: Never  . Sexual activity: Not on file  Other Topics Concern  . Not on file  Social History Narrative  . Not on file   Social Determinants of Health  Financial Resource Strain: Not on file  Food Insecurity: Not on file  Transportation Needs: Not on file  Physical Activity: Not on file  Stress: Not on file  Social Connections: Not on file     Family History: The patient's family history is not on file. He was adopted.  ROS:   Please see the history of present illness.    All other systems reviewed and are negative.  EKGs/Labs/Other Studies Reviewed:    The following studies were reviewed today: IMPRESSIONS    1. Left ventricular ejection fraction, by estimation, is 55 to 60%. The  left ventricle has normal function. The left ventricle has no regional  wall motion abnormalities. Left ventricular diastolic parameters are  consistent with Grade III diastolic  dysfunction (restrictive).  2. Left atrial size was moderately dilated.  3. The mitral valve is abnormal. Mild to moderate mitral valve  regurgitation. No evidence of mitral stenosis. Moderate mitral annular  calcification.  4. The aortic valve is normal in structure. Aortic valve regurgitation is  mild. Mild aortic valve sclerosis is present, with no evidence of aortic  valve stenosis.  5. There is mild dilatation of the ascending aorta, measuring 38 mm.    Recent Labs: 11/14/2019: NT-Pro BNP 616 12/04/2019: BNP 81.4 06/20/2020: ALT 18; BUN 26; Creatinine, Ser 1.35; Potassium 4.4; Sodium 139 08/13/2020: Hemoglobin 16.4; Platelets 128  Recent Lipid Panel    Component Value Date/Time   CHOL 130 06/20/2020 0804   TRIG 92 06/20/2020 0804   HDL 35 (L) 06/20/2020 0804   CHOLHDL 3.7 06/20/2020 0804    LDLCALC 77 06/20/2020 0804    Physical Exam:    VS:  BP 116/66   Pulse 60   Ht 5\' 9"  (1.753 m)   Wt 169 lb 12.8 oz (77 kg)   SpO2 94%   BMI 25.08 kg/m     Wt Readings from Last 3 Encounters:  10/21/20 169 lb 12.8 oz (77 kg)  10/14/20 170 lb 9.6 oz (77.4 kg)  08/13/20 176 lb 4.8 oz (80 kg)     GEN: Patient is in no acute distress HEENT: Normal NECK: No JVD; No carotid bruits LYMPHATICS: No lymphadenopathy CARDIAC: Hear sounds regular, 2/6 systolic murmur at the apex. RESPIRATORY:  Clear to auscultation without rales, wheezing or rhonchi  ABDOMEN: Soft, non-tender, non-distended MUSCULOSKELETAL:  No edema; No deformity  SKIN: Warm and dry NEUROLOGIC:  Alert and oriented x 3 PSYCHIATRIC:  Normal affect   Signed, Jenean Lindau, MD  10/21/2020 9:52 AM    [facility]

## 2020-10-21 NOTE — Patient Instructions (Signed)
Medication Instructions:  Your physician has recommended you make the following change in your medication:   Decrease Metolazone to every other day.  *If you need a refill on your cardiac medications before your next appointment, please call your pharmacy*   Lab Work: You had a BMET today in the office. If you have labs (blood work) drawn today and your tests are completely normal, you will receive your results only by: Marland Kitchen MyChart Message (if you have MyChart) OR . A paper copy in the mail If you have any lab test that is abnormal or we need to change your treatment, we will call you to review the results.   Testing/Procedures: None ordered   Follow-Up: At [facility], you and your health needs are our priority.  As part of our continuing mission to provide you with exceptional heart care, we have created designated Provider Care Teams.  These Care Teams include your primary Cardiologist (physician) and Advanced Practice Providers (APPs -  Physician Assistants and Nurse Practitioners) who all work together to provide you with the care you need, when you need it.  We recommend signing up for the patient portal called "MyChart".  Sign up information is provided on this After Visit Summary.  MyChart is used to connect with patients for Virtual Visits (Telemedicine).  Patients are able to view lab/test results, encounter notes, upcoming appointments, etc.  Non-urgent messages can be sent to your provider as well.   To learn more about what you can do with MyChart, go to [email or web address].    Your next appointment:   8-10 days   The format for your next appointment:   In Person  Provider:   Jyl Heinz, MD   Other Instructions NA

## 2020-10-28 ENCOUNTER — Other Ambulatory Visit: Payer: Self-pay

## 2020-10-28 DIAGNOSIS — I739 Peripheral vascular disease, unspecified: Secondary | ICD-10-CM

## 2020-10-29 ENCOUNTER — Other Ambulatory Visit: Payer: Self-pay

## 2020-10-30 ENCOUNTER — Encounter (HOSPITAL_COMMUNITY): Payer: Self-pay

## 2020-10-30 ENCOUNTER — Other Ambulatory Visit: Payer: Self-pay

## 2020-10-30 ENCOUNTER — Ambulatory Visit (INDEPENDENT_AMBULATORY_CARE_PROVIDER_SITE_OTHER): Payer: Medicare Other | Admitting: Cardiology

## 2020-10-30 ENCOUNTER — Ambulatory Visit (HOSPITAL_COMMUNITY): Payer: Medicare Other

## 2020-10-30 ENCOUNTER — Telehealth: Payer: Self-pay | Admitting: Emergency Medicine

## 2020-10-30 ENCOUNTER — Encounter: Payer: Self-pay | Admitting: Gastroenterology

## 2020-10-30 ENCOUNTER — Encounter: Payer: Medicare Other | Admitting: Vascular Surgery

## 2020-10-30 ENCOUNTER — Encounter: Payer: Self-pay | Admitting: Cardiology

## 2020-10-30 VITALS — BP 114/60 | HR 72 | Ht 69.0 in | Wt 168.6 lb

## 2020-10-30 DIAGNOSIS — I10 Essential (primary) hypertension: Secondary | ICD-10-CM

## 2020-10-30 DIAGNOSIS — E088 Diabetes mellitus due to underlying condition with unspecified complications: Secondary | ICD-10-CM | POA: Diagnosis not present

## 2020-10-30 DIAGNOSIS — E782 Mixed hyperlipidemia: Secondary | ICD-10-CM

## 2020-10-30 DIAGNOSIS — I2510 Atherosclerotic heart disease of native coronary artery without angina pectoris: Secondary | ICD-10-CM

## 2020-10-30 DIAGNOSIS — I4821 Permanent atrial fibrillation: Secondary | ICD-10-CM | POA: Diagnosis not present

## 2020-10-30 DIAGNOSIS — Z87891 Personal history of nicotine dependence: Secondary | ICD-10-CM

## 2020-10-30 NOTE — Telephone Encounter (Deleted)
RB pt stated that he was to do a sleep study last year but was too sick to get this done and would now like to have this done.  Please advise if we need to put the new order in or if we can use the order from 10/21`.  Thanks

## 2020-10-30 NOTE — Patient Instructions (Signed)
Medication Instructions:  No medication changes. *If you need a refill on your cardiac medications before your next appointment, please call your pharmacy*   Lab Work: None ordered If you have labs (blood work) drawn today and your tests are completely normal, you will receive your results only by: Marland Kitchen MyChart Message (if you have MyChart) OR . A paper copy in the mail If you have any lab test that is abnormal or we need to change your treatment, we will call you to review the results.   Testing/Procedures: None ordered   Follow-Up: At [facility], you and your health needs are our priority.  As part of our continuing mission to provide you with exceptional heart care, we have created designated Provider Care Teams.  These Care Teams include your primary Cardiologist (physician) and Advanced Practice Providers (APPs -  Physician Assistants and Nurse Practitioners) who all work together to provide you with the care you need, when you need it.  We recommend signing up for the patient portal called "MyChart".  Sign up information is provided on this After Visit Summary.  MyChart is used to connect with patients for Virtual Visits (Telemedicine).  Patients are able to view lab/test results, encounter notes, upcoming appointments, etc.  Non-urgent messages can be sent to your provider as well.   To learn more about what you can do with MyChart, go to [email or web address].    Your next appointment:   1 month(s)  The format for your next appointment:   In Person  Provider:   Jyl Heinz, MD   Other Instructions NA

## 2020-10-30 NOTE — Progress Notes (Signed)
Cardiology Office Note:    Date:  10/30/2020   ID:  Juan Valdez, DOB 1946-07-13, MRN [id number]  PCP:  Ronita Hipps, MD  Cardiologist:  Jenean Lindau, MD   Referring MD: Ronita Hipps, MD    ASSESSMENT:    1. Permanent atrial fibrillation ([place])   2. Coronary artery disease involving native coronary artery of native heart without angina pectoris   3. Essential hypertension   4. Mixed dyslipidemia   5. Diabetes mellitus due to underlying condition with unspecified complications ([place])   6. Former smoker    PLAN:    In order of problems listed above:  1. Coronary artery disease: Secondary prevention stressed with the patient.  Importance of compliance with diet medication stressed any vocalized understanding.  He promises to walk on a regular basis.  He denies any history of intermittent claudication.  I told him to push himself to walk more than half a mile and walk in a graded fashion and is agreeable. 2. Permanent atrial fibrillation:I discussed with the patient atrial fibrillation, disease process. Management and therapy including rate and rhythm control, anticoagulation benefits and potential risks were discussed extensively with the patient. Patient had multiple questions which were answered to patient's satisfaction. 3. Peripheral vascular disease: As mentioned above graded exercise was recommended he is agreeable.  I will see him back in a month and if he is doing well medical management will be pursued.  He might be a candidate for cilostazol in the future. 4. Ex-smoker: Promises never to go back. 5. Mixed dyslipidemia: Lipids were reviewed diet was emphasized. 6. Diabetes mellitus: Managed by primary care physician.  Diet was emphasized. 7. S and she will hypertension: Stable blood pressure.  Patient has history of congestive heart failure on diuretic therapy and tolerating it well.  Recent blood work was unremarkable.  We will recheck blood work in a month.  Patient  had multiple questions which were answered to his satisfaction.   Medication Adjustments/Labs and Tests Ordered: Current medicines are reviewed at length with the patient today.  Concerns regarding medicines are outlined above.  No orders of the defined types were placed in this encounter.  No orders of the defined types were placed in this encounter.    No chief complaint on file.    History of Present Illness:    Juan Valdez is a 75 y.o. male.  Patient has past medical history of coronary artery disease, permanent atrial fibrillation, essential hypertension diabetes mellitus and dyslipidemia.  He denies any problems at this time and takes care of activities of daily living.  He is a former smoker.  He has peripheral vascular disease but he tells me that he can walk about half a mile without any problems.  No chest pain orthopnea or PND.  He brings a paper with him which indicates significant diminished ABI.  At the time of my evaluation, the patient is alert awake oriented and in no distress.  Past Medical History:  Diagnosis Date  . Abdominal bloating 05/06/2019  . Abdominal distension 05/06/2019  . Abnormal nuclear cardiac imaging test 08/22/2019  . 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 Allergic rhinitis 05/29/2019  . Anemia   . Atrial fibrillation ([place]) 09/07/2018  . Bilateral recurrent inguinal hernia without obstruction or gangrene 05/06/2019  . CAD (coronary artery disease)    a. s/p prior DES to RCA b. cath in 07/2019 showing 75% Prox to mid RCA stenosis, 5% ISR along previously placed distal RCA  stent, 85% Prox LCx stenosis, and 100% CTO of distal LCx with filled from L--> L collaterals. Also had a long-diffuse 65-75% stenosis along proximal to mid-LAD. Unable to fully expand balloon for PCI of RCA and lesion reduced to 65%. Future atherectotmy of RCA and LCx.   Marland Kitchen CAD S/P percutaneous coronary angioplasty 07/20/2019  . CHF (congestive heart failure) ([place]) 11/27/2019  . Colon  polyp   . COPD (chronic obstructive pulmonary disease) ([place])   . COPD with acute exacerbation ([place]) 06/14/2019  . Coronary artery disease involving native coronary artery of native heart with angina pectoris ([place]) 07/20/2019  . Diabetes ([place])   . Diabetes mellitus due to underlying condition with unspecified complications ([place]) 30/05/2329  . Diverticulosis   . DOE (dyspnea on exertion) 07/13/2019  . Essential hypertension 09/07/2018  . Excessive daytime sleepiness 06/09/2020  . 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 Generalized abdominal pain 05/06/2019  . GERD (gastroesophageal reflux disease)   . Gout   . HLD (hyperlipidemia)   . 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 Hx of colonic polyps 05/06/2019  . Irritable bowel syndrome with constipation 05/06/2019  . Lung cancer ([place])   . Medication management 01/04/2019  . Mixed dyslipidemia 09/07/2018  . Non-small cell cancer of right lung ([place]) 11/23/2018  . Onychomycosis due to dermatophyte 11/16/2018  . Orthopnea 01/04/2019  . Perforated bowel ([place])   . Permanent atrial fibrillation ([place]) 09/07/2018  . SBO (small bowel obstruction) ([place]) 05/08/2019  . Shortness of breath 07/08/2019    Past Surgical History:  Procedure Laterality Date  . ABDOMINAL SURGERY    . CORONARY ANGIOPLASTY WITH STENT PLACEMENT  2003  . CORONARY ATHERECTOMY N/A 08/22/2019   Procedure: CORONARY ATHERECTOMY;  Surgeon: Leonie Man, MD;  Location: [facility];  Service: Cardiovascular;  Laterality: N/A;  . CORONARY BALLOON ANGIOPLASTY N/A 07/20/2019   Procedure: CORONARY BALLOON ANGIOPLASTY;  Surgeon: Leonie Man, MD;  Location: [facility];  Service: Cardiovascular;  Laterality: N/A;  RCA  . CORONARY BALLOON ANGIOPLASTY N/A 08/22/2019   Procedure: CORONARY BALLOON ANGIOPLASTY;  Surgeon: Leonie Man, MD;   Location: [facility];  Service: Cardiovascular;  Laterality: N/A;  . CORONARY STENT INTERVENTION  08/22/2019  . CORONARY STENT INTERVENTION N/A 08/22/2019   Procedure: CORONARY STENT INTERVENTION;  Surgeon: Leonie Man, MD;  Location: [facility];  Service: Cardiovascular;  Laterality: N/A;  . LEFT HEART CATH AND CORONARY ANGIOGRAPHY N/A 07/20/2019   Procedure: LEFT HEART CATH AND CORONARY ANGIOGRAPHY;  Surgeon: Leonie Man, MD;  Location: [facility];  Service: Cardiovascular;  Laterality: N/A;  . LEFT HEART CATH AND CORONARY ANGIOGRAPHY N/A 08/22/2019   Procedure: LEFT HEART CATH AND CORONARY ANGIOGRAPHY;  Surgeon: Leonie Man, MD;  Location: [facility];  Service: Cardiovascular;  Laterality: N/A;  . LUNG CANCER SURGERY    . ROTATOR CUFF REPAIR Left   . TEMPORARY PACEMAKER N/A 08/22/2019   Procedure: TEMPORARY PACEMAKER;  Surgeon: Leonie Man, MD;  Location: [facility];  Service: Cardiovascular;  Laterality: N/A;  . TONSILLECTOMY     age 80    Current Medications: Current Meds  Medication Sig  . ALAWAY 0.025 % ophthalmic solution Place 1 drop into both eyes 2 (two) times daily as needed (allergy/irritated eyes.).  Marland Kitchen albuterol (PROVENTIL) (  2.5 MG/3ML) 0.083% nebulizer solution Take 3 mLs (2.5 mg total) by nebulization every 6 (six) hours as needed for up to 4 doses for wheezing or shortness of breath.  . allopurinol (ZYLOPRIM) 300 MG tablet Take 300 mg by mouth daily.  . Ascorbic Acid (VITAMIN C) 1000 MG tablet Take 1,000 mg by mouth daily.  Marland Kitchen atorvastatin (LIPITOR) 40 MG tablet Take 40 mg by mouth every evening.   . Cholecalciferol (VITAMIN D3) 50 MCG (2000 UT) TABS Take 2,000 Units by mouth daily.  Marland Kitchen dicyclomine (BENTYL) 20 MG tablet Take 1 tablet (20 mg total) by mouth every 8 (eight) hours as needed for spasms.  Marland Kitchen ELIQUIS 5 MG TABS tablet TAKE 1 TABLET BY MOUTH TWICE DAILY  . Fluticasone-Umeclidin-Vilant (TRELEGY ELLIPTA)  100-62.5-25 MCG/INH AEPB Inhale 1 puff into the lungs daily.  Marland Kitchen GAS RELIEF ULTRA STRENGTH 180 MG CAPS Take 180 mg by mouth 2 (two) times daily as needed. For gas relief  . glipiZIDE (GLUCOTROL XL) 2.5 MG 24 hr tablet Take 2.5 mg by mouth daily with breakfast.  . HOMEOPATHIC PRODUCTS EX Apply 1 application topically 2 (two) times daily as needed (diabetic pain.). Topricin Foot Pain Relief Cream  . ipratropium (ATROVENT) 0.03 % nasal spray Place 2 sprays into both nostrils every 12 (twelve) hours as needed for rhinitis.  Marland Kitchen metolazone (ZAROXOLYN) 2.5 MG tablet Take 1 tablet (2.5 mg total) by mouth every other day.  . metoprolol succinate (TOPROL-XL) 25 MG 24 hr tablet TAKE 1/2 TABLET BY MOUTH EVERY MORNING  . nitroGLYCERIN (NITROSTAT) 0.4 MG SL tablet DISSOLVE 1 TABLET UNDER THE TONGUE EVERY 5 MINUTES AS NEEDED FOR CHEST PAIN  . Omega-3 Fatty Acids (FISH OIL) 1200 MG CAPS Take 1,200 mg by mouth daily.  Marland Kitchen omeprazole (PRILOSEC) 20 MG capsule Take 20 mg by mouth 2 (two) times daily.  . potassium gluconate 595 (99 K) MG TABS tablet Take 595 mg by mouth daily.  Marland Kitchen PROAIR HFA 108 (90 Base) MCG/ACT inhaler Inhale 2 puffs into the lungs every 2 (two) hours as needed for shortness of breath or wheezing.  . ranolazine (RANEXA) 1000 MG SR tablet Take 1 tablet (1,000 mg total) by mouth 2 (two) times daily.  . RESTASIS 0.05 % ophthalmic emulsion Place 1 drop into both eyes every 12 (twelve) hours.  . tamsulosin (FLOMAX) 0.4 MG CAPS capsule Take 0.4 mg by mouth daily after supper.   . traMADol (ULTRAM) 50 MG tablet Take 50 mg by mouth every 6 (six) hours as needed for pain.  . traZODone (DESYREL) 150 MG tablet Take 150 mg by mouth at bedtime.   . triamcinolone cream (KENALOG) 0.1 % Apply 1 application topically daily.     Allergies:   Patient has no known allergies.   Social History   Socioeconomic History  . Marital status: Widowed    Spouse name: Not on file  . Number of children: 0  . Years of  education: Not on file  . Highest education level: Not on file  Occupational History  . Occupation: retired  Tobacco Use  . Smoking status: Former Smoker    Packs/day: 3.00    Years: 57.00    Pack years: 171.00    Types: Cigarettes    Start date: 67    Quit date: 06/13/2014    Years since quitting: 6.3  . Smokeless tobacco: Never Used  Vaping Use  . Vaping Use: Never used  Substance and Sexual Activity  . Alcohol use: Not Currently  .  Drug use: Never  . Sexual activity: Not on file  Other Topics Concern  . Not on file  Social History Narrative  . Not on file   Social Determinants of Health   Financial Resource Strain: Not on file  Food Insecurity: Not on file  Transportation Needs: Not on file  Physical Activity: Not on file  Stress: Not on file  Social Connections: Not on file     Family History: The patient's family history is not on file. He was adopted.  ROS:   Please see the history of present illness.    All other systems reviewed and are negative.  EKGs/Labs/Other Studies Reviewed:    The following studies were reviewed today: I discussed my findings with the patient at length.   Recent Labs: 11/14/2019: NT-Pro BNP 616 12/04/2019: BNP 81.4 06/20/2020: ALT 18 08/13/2020: Hemoglobin 16.4; Platelets 128 10/21/2020: BUN 22; Creatinine, Ser 1.52; Potassium 4.1; Sodium 140  Recent Lipid Panel    Component Value Date/Time   CHOL 130 06/20/2020 0804   TRIG 92 06/20/2020 0804   HDL 35 (L) 06/20/2020 0804   CHOLHDL 3.7 06/20/2020 0804   LDLCALC 77 06/20/2020 0804    Physical Exam:    VS:  BP 114/60   Pulse 72   Ht 5\' 9"  (1.753 m)   Wt 168 lb 9.6 oz (76.5 kg)   SpO2 94%   BMI 24.90 kg/m     Wt Readings from Last 3 Encounters:  10/30/20 168 lb 9.6 oz (76.5 kg)  10/21/20 169 lb 12.8 oz (77 kg)  10/14/20 170 lb 9.6 oz (77.4 kg)     GEN: Patient is in no acute distress HEENT: Normal NECK: No JVD; No carotid bruits LYMPHATICS: No  lymphadenopathy CARDIAC: Hear sounds regular, 2/6 systolic murmur at the apex. RESPIRATORY:  Clear to auscultation without rales, wheezing or rhonchi  ABDOMEN: Soft, non-tender, non-distended MUSCULOSKELETAL:  No edema; No deformity  SKIN: Warm and dry NEUROLOGIC:  Alert and oriented x 3 PSYCHIATRIC:  Normal affect   Signed, Jenean Lindau, MD  10/30/2020 10:02 AM    [place]

## 2020-10-30 NOTE — Telephone Encounter (Signed)
PCC's can we get this ordered for the pt please.  Thanks

## 2020-10-31 NOTE — Telephone Encounter (Addendum)
This was ordered last Sept & pt wanted to go to [facility].  I called today & spoke to April.  She states to fax order, last ov note, insurance card to her attn [phone] and the will schedule.  Their provider will read and send results to [place].  I called pt & made him aware they would call him to schedule.  Order faxed.  Nothing further needed.

## 2020-11-04 DIAGNOSIS — L821 Other seborrheic keratosis: Secondary | ICD-10-CM | POA: Diagnosis not present

## 2020-11-04 DIAGNOSIS — L299 Pruritus, unspecified: Secondary | ICD-10-CM | POA: Diagnosis not present

## 2020-11-04 DIAGNOSIS — L300 Nummular dermatitis: Secondary | ICD-10-CM | POA: Diagnosis not present

## 2020-11-06 NOTE — Telephone Encounter (Signed)
OK to continue Aspirin.  Dr. Geraldo Pitter, preference is to hold Plavix for 5 days and Eliquis for 1 day before procedure, please let us know if that's acceptable. If you feel its high risk to hold antiplatelets or Eliquis, then I will plan to proceed with diagnostic exam without stopping the meds.  Appreciate your help Thanks

## 2020-11-06 NOTE — Telephone Encounter (Signed)
It seems as of 2/17 with appointment at Cardiology patient is on Eliquis.... How many days do you want to hold before his procedure?

## 2020-11-06 NOTE — Telephone Encounter (Signed)
That decision will be taken by the physician who is during the procedure.  Our recommendation is for as short a time as possible.  Also patient needs to be either on aspirin or Plavix in my preference is not to stop both of them since he has a history of stent.

## 2020-11-06 NOTE — Telephone Encounter (Signed)
Dr Silverio Decamp Please advise on this patients blood thinner  See notes

## 2020-11-07 NOTE — Telephone Encounter (Signed)
Thank you :)

## 2020-11-07 NOTE — Telephone Encounter (Signed)
Kavitha, Keep the aspirin on. Agree with holding Eliquis and clopidogrel as you mentioned.  Thank you for reaching out to me.  I hear a lot of wonderful things about you from the patients we have in common.  Keep up the good work. Sunny Schlein

## 2020-11-10 DIAGNOSIS — D649 Anemia, unspecified: Secondary | ICD-10-CM | POA: Diagnosis not present

## 2020-11-10 DIAGNOSIS — I4891 Unspecified atrial fibrillation: Secondary | ICD-10-CM | POA: Diagnosis not present

## 2020-11-10 DIAGNOSIS — E119 Type 2 diabetes mellitus without complications: Secondary | ICD-10-CM | POA: Diagnosis not present

## 2020-11-12 DIAGNOSIS — Z6824 Body mass index (BMI) 24.0-24.9, adult: Secondary | ICD-10-CM | POA: Diagnosis not present

## 2020-11-12 DIAGNOSIS — R634 Abnormal weight loss: Secondary | ICD-10-CM | POA: Diagnosis not present

## 2020-11-12 DIAGNOSIS — Z85118 Personal history of other malignant neoplasm of bronchus and lung: Secondary | ICD-10-CM | POA: Diagnosis not present

## 2020-11-18 DIAGNOSIS — I708 Atherosclerosis of other arteries: Secondary | ICD-10-CM | POA: Diagnosis not present

## 2020-11-18 DIAGNOSIS — I898 Other specified noninfective disorders of lymphatic vessels and lymph nodes: Secondary | ICD-10-CM | POA: Diagnosis not present

## 2020-11-18 DIAGNOSIS — C3490 Malignant neoplasm of unspecified part of unspecified bronchus or lung: Secondary | ICD-10-CM | POA: Diagnosis not present

## 2020-11-18 DIAGNOSIS — I2510 Atherosclerotic heart disease of native coronary artery without angina pectoris: Secondary | ICD-10-CM | POA: Diagnosis not present

## 2020-11-18 DIAGNOSIS — Z85118 Personal history of other malignant neoplasm of bronchus and lung: Secondary | ICD-10-CM | POA: Diagnosis not present

## 2020-11-21 ENCOUNTER — Telehealth: Payer: Self-pay | Admitting: Emergency Medicine

## 2020-11-21 ENCOUNTER — Other Ambulatory Visit: Payer: Self-pay

## 2020-11-21 ENCOUNTER — Encounter: Payer: Self-pay | Admitting: Emergency Medicine

## 2020-11-21 ENCOUNTER — Ambulatory Visit (INDEPENDENT_AMBULATORY_CARE_PROVIDER_SITE_OTHER): Payer: Medicare Other | Admitting: Emergency Medicine

## 2020-11-21 ENCOUNTER — Encounter (HOSPITAL_COMMUNITY): Payer: Self-pay | Admitting: Emergency Medicine

## 2020-11-21 VITALS — BP 120/80 | HR 57 | Temp 98.3°F | Ht 69.0 in | Wt 169.6 lb

## 2020-11-21 DIAGNOSIS — C3491 Malignant neoplasm of unspecified part of right bronchus or lung: Secondary | ICD-10-CM

## 2020-11-21 DIAGNOSIS — J431 Panlobular emphysema: Secondary | ICD-10-CM

## 2020-11-21 DIAGNOSIS — R590 Localized enlarged lymph nodes: Secondary | ICD-10-CM | POA: Diagnosis not present

## 2020-11-21 DIAGNOSIS — G4719 Other hypersomnia: Secondary | ICD-10-CM

## 2020-11-21 LAB — CBC WITH DIFFERENTIAL/PLATELET
Basophils Absolute: 0.1 10*3/uL (ref 0.0–0.1)
Basophils Relative: 0.5 % (ref 0.0–3.0)
Eosinophils Absolute: 0.1 10*3/uL (ref 0.0–0.7)
Eosinophils Relative: 1.1 % (ref 0.0–5.0)
HCT: 45.5 % (ref 39.0–52.0)
Hemoglobin: 15.6 g/dL (ref 13.0–17.0)
Lymphocytes Relative: 6.6 % — ABNORMAL LOW (ref 12.0–46.0)
Lymphs Abs: 0.7 10*3/uL (ref 0.7–4.0)
MCHC: 34.3 g/dL (ref 30.0–36.0)
MCV: 94 fl (ref 78.0–100.0)
Monocytes Absolute: 0.7 10*3/uL (ref 0.1–1.0)
Monocytes Relative: 7.1 % (ref 3.0–12.0)
Neutro Abs: 8.4 10*3/uL — ABNORMAL HIGH (ref 1.4–7.7)
Neutrophils Relative %: 84.7 % — ABNORMAL HIGH (ref 43.0–77.0)
Platelets: 131 10*3/uL — ABNORMAL LOW (ref 150.0–400.0)
RBC: 4.84 Mil/uL (ref 4.22–5.81)
RDW: 15.8 % — ABNORMAL HIGH (ref 11.5–15.5)
WBC: 9.9 10*3/uL (ref 4.0–10.5)

## 2020-11-21 LAB — PROTIME-INR
INR: 1.2 ratio — ABNORMAL HIGH (ref 0.8–1.0)
Prothrombin Time: 13.9 s — ABNORMAL HIGH (ref 9.6–13.1)

## 2020-11-21 LAB — BASIC METABOLIC PANEL
BUN: 24 mg/dL — ABNORMAL HIGH (ref 6–23)
CO2: 34 mEq/L — ABNORMAL HIGH (ref 19–32)
Calcium: 9.8 mg/dL (ref 8.4–10.5)
Chloride: 97 mEq/L (ref 96–112)
Creatinine, Ser: 1.44 mg/dL (ref 0.40–1.50)
GFR: 47.24 mL/min — ABNORMAL LOW (ref >60.00)
Glucose, Bld: 131 mg/dL — ABNORMAL HIGH (ref 70–99)
Potassium: 4 mEq/L (ref 3.5–5.1)
Sodium: 138 mEq/L (ref 135–145)

## 2020-11-21 MED ORDER — TRELEGY ELLIPTA 100-62.5-25 MCG/INH IN AEPB: 1.0000 | INHALATION_SPRAY | Freq: Every day | RESPIRATORY_TRACT | 0 refills | Status: AC

## 2020-11-21 NOTE — Assessment & Plan Note (Signed)
Continue Trelegy 1 inhalation once daily.  Rinse and gargle after using. Keep albuterol available use 2 puffs when you need it for shortness of breath

## 2020-11-21 NOTE — Telephone Encounter (Signed)
Called and spoke with [facility]. Need to speak with the outpatient radiology Center on Monday morning to request that Ct chest be converted to SuperD. Hopefully without medical release, but patient may need to provide one.   [facility] radiology >> [phone], ext [phone].

## 2020-11-21 NOTE — Assessment & Plan Note (Signed)
Plan to get your sleep study at [facility] as scheduled.  We can follow this up after is completed

## 2020-11-21 NOTE — Progress Notes (Signed)
Subjective:    Patient ID: Juan Valdez, male    DOB: 08-12-1946, 75 y.o.   MRN: [id number]  HPI 75 year old man with a history of tobacco use (100 pack years), atrial fibrillation, CAD, diabetes and COPD.  He also has a history of non-small cell lung cancer, post resection in 2015, RM lobectomy with no XRT or chemo.   ROV 11/21/20 --75 year old man with COPD and a right middle lobectomy for non-small cell lung cancer (2015).  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  Has been following annual CTs for his TAA and to follow post lobectomy, most recent earlier this week as below.  Currently managed on Trelegy.   He has lost about 15 lbs since December. Poor appetite.   He is undergoing a GI evaluation w Dr Nyoka Cowden for abd pain - planning for CSY and EGD in April.   CT chest performed 11/18/2020 reviewed by me, shows enlarging hilar and mediastinal lymphadenopathy.  There is some new left lower lobe peribronchovascular nodularity of unclear significance.    Review of Systems  Constitutional: Negative for fever and unexpected weight change.  HENT: Negative for congestion, dental problem, ear pain, nosebleeds, postnasal drip, rhinorrhea, sinus pressure, sneezing, sore throat and trouble swallowing.   Eyes: Negative for redness and itching.  Respiratory: Positive for shortness of breath. Negative for cough, chest tightness and wheezing.   Cardiovascular: Positive for chest pain. Negative for palpitations and leg swelling.  Gastrointestinal: Positive for abdominal pain. Negative for nausea and vomiting.  Genitourinary: Negative for dysuria.  Musculoskeletal: Negative for joint swelling.  Skin: Negative for rash.  Neurological: Negative for headaches.  Hematological: Does not bruise/bleed easily.  Psychiatric/Behavioral: Negative for dysphoric mood. The patient is not nervous/anxious.     Past  Medical History:  Diagnosis Date  . Abdominal bloating 05/06/2019  . Abdominal distension 05/06/2019  . Abnormal nuclear cardiac imaging test 08/22/2019  . 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 Allergic rhinitis 05/29/2019  . Anemia   . Atrial fibrillation ([place]) 09/07/2018  . Bilateral recurrent inguinal hernia without obstruction or gangrene 05/06/2019  . CAD (coronary artery disease)    a. s/p prior DES to RCA b. cath in 07/2019 showing 75% Prox to mid RCA stenosis, 5% ISR along previously placed distal RCA stent, 85% Prox LCx stenosis, and 100% CTO of distal LCx with filled from L--> L collaterals. Also had a long-diffuse 65-75% stenosis along proximal to mid-LAD. Unable to fully expand balloon for PCI of RCA and lesion reduced to 65%. Future atherectotmy of RCA and LCx.   Marland Kitchen CAD S/P percutaneous coronary angioplasty 07/20/2019  . CHF (congestive heart failure) ([place]) 11/27/2019  . Colon polyp   . COPD (chronic obstructive pulmonary disease) ([place])   . COPD with acute exacerbation ([place]) 06/14/2019  . Coronary artery disease involving native coronary artery of native heart with angina pectoris ([place]) 07/20/2019  . Diabetes ([place])   . Diabetes mellitus due to underlying condition with unspecified complications ([place]) 67/67/2094  . Diverticulosis   . DOE (dyspnea on exertion) 07/13/2019  . Essential hypertension 09/07/2018  . Excessive daytime sleepiness 06/09/2020  . 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 Generalized abdominal pain 05/06/2019  . GERD (gastroesophageal reflux disease)   . Gout   . HLD (hyperlipidemia)   . Hx of  arteriovenous malformation (AVM) 05/06/2019  . Hx of colonic polyps 05/06/2019  . Irritable bowel syndrome with constipation 05/06/2019  . Lung cancer ([place])   . Medication management 01/04/2019  . Mixed dyslipidemia 09/07/2018  . Non-small cell  cancer of right lung ([place]) 11/23/2018  . Onychomycosis due to dermatophyte 11/16/2018  . Orthopnea 01/04/2019  . Perforated bowel ([place])   . Permanent atrial fibrillation ([place]) 09/07/2018  . SBO (small bowel obstruction) ([place]) 05/08/2019  . Shortness of breath 07/08/2019     Family History  Adopted: Yes     Social History   Socioeconomic History  . Marital status: Widowed    Spouse name: Not on file  . Number of children: 0  . Years of education: Not on file  . Highest education level: Not on file  Occupational History  . Occupation: retired  Tobacco Use  . Smoking status: Former Smoker    Packs/day: 3.00    Years: 57.00    Pack years: 171.00    Types: Cigarettes    Start date: 10    Quit date: 06/13/2014    Years since quitting: 6.4  . Smokeless tobacco: Never Used  Vaping Use  . Vaping Use: Never used  Substance and Sexual Activity  . Alcohol use: Not Currently  . Drug use: Never  . Sexual activity: Not on file  Other Topics Concern  . Not on file  Social History Narrative  . Not on file   Social Determinants of Health   Financial Resource Strain: Not on file  Food Insecurity: Not on file  Transportation Needs: Not on file  Physical Activity: Not on file  Stress: Not on file  Social Connections: Not on file  Intimate Partner Violence: Not on file  has lived in [place] Has worked as a Multimedia programmer,  Had horses with [place] exposure.    No Known Allergies   Outpatient Medications Prior to Visit  Medication Sig Dispense Refill  . ALAWAY 0.025 % ophthalmic solution Place 1 drop into both eyes 2 (two) times daily as needed (allergy/irritated eyes.).    Marland Kitchen albuterol (PROVENTIL) (2.5 MG/3ML) 0.083% nebulizer solution Take 3 mLs (2.5 mg total) by nebulization every 6 (six) hours as needed for up to 4 doses for wheezing or shortness of breath. 1080 mL 0  . allopurinol (ZYLOPRIM) 300 MG tablet Take 300 mg by mouth daily.    . Ascorbic Acid (VITAMIN C) 1000 MG tablet Take  1,000 mg by mouth daily.    Marland Kitchen atorvastatin (LIPITOR) 40 MG tablet Take 40 mg by mouth every evening.     . Cholecalciferol (VITAMIN D3) 50 MCG (2000 UT) TABS Take 2,000 Units by mouth daily.    Marland Kitchen dicyclomine (BENTYL) 20 MG tablet Take 1 tablet (20 mg total) by mouth every 8 (eight) hours as needed for spasms. 120 tablet 0  . ELIQUIS 5 MG TABS tablet TAKE 1 TABLET BY MOUTH TWICE DAILY 180 tablet 1  . Fluticasone-Umeclidin-Vilant (TRELEGY ELLIPTA) 100-62.5-25 MCG/INH AEPB Inhale 1 puff into the lungs daily. 1 each 0  . GAS RELIEF ULTRA STRENGTH 180 MG CAPS Take 180 mg by mouth 2 (two) times daily as needed. For gas relief    . glipiZIDE (GLUCOTROL XL) 2.5 MG 24 hr tablet Take 2.5 mg by mouth daily with breakfast.    . HOMEOPATHIC PRODUCTS EX Apply 1 application topically 2 (two) times daily as needed (diabetic pain.). Topricin Foot Pain Relief Cream    . ipratropium (ATROVENT) 0.03 % nasal  spray Place 2 sprays into both nostrils every 12 (twelve) hours as needed for rhinitis. 30 mL 6  . metolazone (ZAROXOLYN) 2.5 MG tablet Take 1 tablet (2.5 mg total) by mouth every other day. 45 tablet 3  . metoprolol succinate (TOPROL-XL) 25 MG 24 hr tablet TAKE 1/2 TABLET BY MOUTH EVERY MORNING 45 tablet 2  . nitroGLYCERIN (NITROSTAT) 0.4 MG SL tablet DISSOLVE 1 TABLET UNDER THE TONGUE EVERY 5 MINUTES AS NEEDED FOR CHEST PAIN 25 tablet 3  . Omega-3 Fatty Acids (FISH OIL) 1200 MG CAPS Take 1,200 mg by mouth daily.    Marland Kitchen omeprazole (PRILOSEC) 20 MG capsule Take 20 mg by mouth 2 (two) times daily.    . potassium gluconate 595 (99 K) MG TABS tablet Take 595 mg by mouth daily.    Marland Kitchen PROAIR HFA 108 (90 Base) MCG/ACT inhaler Inhale 2 puffs into the lungs every 2 (two) hours as needed for shortness of breath or wheezing.    . ranolazine (RANEXA) 1000 MG SR tablet Take 1 tablet (1,000 mg total) by mouth 2 (two) times daily. 180 tablet 3  . RESTASIS 0.05 % ophthalmic emulsion Place 1 drop into both eyes every 12 (twelve)  hours.    . tamsulosin (FLOMAX) 0.4 MG CAPS capsule Take 0.4 mg by mouth daily after supper.     . traMADol (ULTRAM) 50 MG tablet Take 50 mg by mouth every 6 (six) hours as needed for pain.    . traZODone (DESYREL) 150 MG tablet Take 150 mg by mouth at bedtime.     . triamcinolone cream (KENALOG) 0.1 % Apply 1 application topically daily.     No facility-administered medications prior to visit.        Objective:   Physical Exam Vitals:   11/21/20 1040  BP: 120/80  Pulse: (!) 57  Temp: 98.3 F (36.8 C)  TempSrc: Temporal  SpO2: 96%  Weight: 169 lb 9.6 oz (76.9 kg)  Height: 5\' 9"  (1.753 m)   Gen: Pleasant, well-nourished, in no distress,  normal affect  ENT: No lesions,  mouth clear,  oropharynx clear, no postnasal drip  Neck: No JVD, no stridor  Lungs: No use of accessory muscles, no crackles or wheezing on normal respiration, no wheeze on forced expiration  Cardiovascular: RRR, heart sounds normal, no murmur or gallops, no peripheral edema  Musculoskeletal: No deformities, no cyanosis or clubbing  Neuro: alert, awake, non focal  Skin: Warm, no lesions or rash      Assessment & Plan:  Non-small cell cancer of right lung (HCC) Post lobectomy.  Unfortunately he now has some bulky mediastinal lymphadenopathy that is new compared with prior CTs.  Concerning for possible metastatic disease.  Also with some subtle peripheral left lower lobe micronodular disease of unclear significance.  I think he needs bronchoscopy with EBUS to sample the nodes, we can navigate to the peripheral left lower lobe to perform washings for cytology and culture data.  Not clear to me that transbronchial biopsies would be high yield as there is no discrete dominant nodule.  We will work on arranging soon as possible  We will arrange for bronchoscopy with endobronchial ultrasound to evaluate your enlarged lymph nodes.  We will try to get this done next week.  You will be called with details about  scheduling and instructions. You will need to stop your Eliquis 2 days prior to the bronchoscopy Follow with Dr Lamonte Sakai in 1 month or next available  COPD (chronic obstructive pulmonary disease) (  [place]) Continue Trelegy 1 inhalation once daily.  Rinse and gargle after using. Keep albuterol available use 2 puffs when you need it for shortness of breath  Excessive daytime sleepiness Plan to get your sleep study at [facility] as scheduled.  We can follow this up after is completed  Baltazar Apo, MD, PhD 11/21/2020, 11:06 AM [facility] [phone] or if no answer [phone]

## 2020-11-21 NOTE — Addendum Note (Signed)
Addended by: Suzzanne Cloud E on: 11/21/2020 11:15 AM   Modules accepted: Orders

## 2020-11-21 NOTE — Progress Notes (Signed)
Cardiologist:Rajan Revankar, MD  EKG: 10/21/20 CXR: 11/14/19 ECHO: 08/06/20 Stress Test: 07/17/20 Cardiac Cath: 07/20/19  Fasting Blood Sugar- 100-200 Checks Blood Sugar__2_ times a day  OSA/CPAP:  No Having sleep study in April, 2022.  ASA: No Blood Thinner:  Elliquis last dose 3/12 @ HS.   Covid test 11/22/20  Anesthesia Review:  Yes, CAD, Afib  Patient denies shortness of breath, fever, cough, and chest pain at PAT appointment.  Patient verbalized understanding of instructions provided today at the PAT appointment.  Patient asked to review instructions at home and day of surgery.

## 2020-11-21 NOTE — Addendum Note (Signed)
Addended by: Gavin Potters R on: 11/21/2020 12:14 PM   Modules accepted: Orders

## 2020-11-21 NOTE — H&P (View-Only) (Signed)
Subjective:    Patient ID: Juan Valdez, male    DOB: Aug 24, 1946, 75 y.o.   MRN: [id number]  HPI 75 year old man with a history of tobacco use (100 pack years), atrial fibrillation, CAD, diabetes and COPD.  He also has a history of non-small cell lung cancer, post resection in 2015, RM lobectomy with no XRT or chemo.   ROV 11/21/20 --75 year old man with COPD and a right middle lobectomy for non-small cell lung cancer (2015).  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  Has been following annual CTs for his TAA and to follow post lobectomy, most recent earlier this week as below.  Currently managed on Trelegy.   He has lost about 15 lbs since December. Poor appetite.   He is undergoing a GI evaluation w Dr Nyoka Cowden for abd pain - planning for CSY and EGD in April.   CT chest performed 11/18/2020 reviewed by me, shows enlarging hilar and mediastinal lymphadenopathy.  There is some new left lower lobe peribronchovascular nodularity of unclear significance.    Review of Systems  Constitutional: Negative for fever and unexpected weight change.  HENT: Negative for congestion, dental problem, ear pain, nosebleeds, postnasal drip, rhinorrhea, sinus pressure, sneezing, sore throat and trouble swallowing.   Eyes: Negative for redness and itching.  Respiratory: Positive for shortness of breath. Negative for cough, chest tightness and wheezing.   Cardiovascular: Positive for chest pain. Negative for palpitations and leg swelling.  Gastrointestinal: Positive for abdominal pain. Negative for nausea and vomiting.  Genitourinary: Negative for dysuria.  Musculoskeletal: Negative for joint swelling.  Skin: Negative for rash.  Neurological: Negative for headaches.  Hematological: Does not bruise/bleed easily.  Psychiatric/Behavioral: Negative for dysphoric mood. The patient is not nervous/anxious.     Past  Medical History:  Diagnosis Date  . Abdominal bloating 05/06/2019  . Abdominal distension 05/06/2019  . Abnormal nuclear cardiac imaging test 08/22/2019  . 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 Allergic rhinitis 05/29/2019  . Anemia   . Atrial fibrillation ([place]) 09/07/2018  . Bilateral recurrent inguinal hernia without obstruction or gangrene 05/06/2019  . CAD (coronary artery disease)    a. s/p prior DES to RCA b. cath in 07/2019 showing 75% Prox to mid RCA stenosis, 5% ISR along previously placed distal RCA stent, 85% Prox LCx stenosis, and 100% CTO of distal LCx with filled from L--> L collaterals. Also had a long-diffuse 65-75% stenosis along proximal to mid-LAD. Unable to fully expand balloon for PCI of RCA and lesion reduced to 65%. Future atherectotmy of RCA and LCx.   Marland Kitchen CAD S/P percutaneous coronary angioplasty 07/20/2019  . CHF (congestive heart failure) ([place]) 11/27/2019  . Colon polyp   . COPD (chronic obstructive pulmonary disease) ([place])   . COPD with acute exacerbation ([place]) 06/14/2019  . Coronary artery disease involving native coronary artery of native heart with angina pectoris ([place]) 07/20/2019  . Diabetes ([place])   . Diabetes mellitus due to underlying condition with unspecified complications ([place]) 61/44/3154  . Diverticulosis   . DOE (dyspnea on exertion) 07/13/2019  . Essential hypertension 09/07/2018  . Excessive daytime sleepiness 06/09/2020  . 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 Generalized abdominal pain 05/06/2019  . GERD (gastroesophageal reflux disease)   . Gout   . HLD (hyperlipidemia)   . Hx of  arteriovenous malformation (AVM) 05/06/2019  . Hx of colonic polyps 05/06/2019  . Irritable bowel syndrome with constipation 05/06/2019  . Lung cancer ([place])   . Medication management 01/04/2019  . Mixed dyslipidemia 09/07/2018  . Non-small cell  cancer of right lung ([place]) 11/23/2018  . Onychomycosis due to dermatophyte 11/16/2018  . Orthopnea 01/04/2019  . Perforated bowel ([place])   . Permanent atrial fibrillation ([place]) 09/07/2018  . SBO (small bowel obstruction) ([place]) 05/08/2019  . Shortness of breath 07/08/2019     Family History  Adopted: Yes     Social History   Socioeconomic History  . Marital status: Widowed    Spouse name: Not on file  . Number of children: 0  . Years of education: Not on file  . Highest education level: Not on file  Occupational History  . Occupation: retired  Tobacco Use  . Smoking status: Former Smoker    Packs/day: 3.00    Years: 57.00    Pack years: 171.00    Types: Cigarettes    Start date: 40    Quit date: 06/13/2014    Years since quitting: 6.4  . Smokeless tobacco: Never Used  Vaping Use  . Vaping Use: Never used  Substance and Sexual Activity  . Alcohol use: Not Currently  . Drug use: Never  . Sexual activity: Not on file  Other Topics Concern  . Not on file  Social History Narrative  . Not on file   Social Determinants of Health   Financial Resource Strain: Not on file  Food Insecurity: Not on file  Transportation Needs: Not on file  Physical Activity: Not on file  Stress: Not on file  Social Connections: Not on file  Intimate Partner Violence: Not on file  has lived in [place] Has worked as a Multimedia programmer,  Had horses with [place] exposure.    No Known Allergies   Outpatient Medications Prior to Visit  Medication Sig Dispense Refill  . ALAWAY 0.025 % ophthalmic solution Place 1 drop into both eyes 2 (two) times daily as needed (allergy/irritated eyes.).    Marland Kitchen albuterol (PROVENTIL) (2.5 MG/3ML) 0.083% nebulizer solution Take 3 mLs (2.5 mg total) by nebulization every 6 (six) hours as needed for up to 4 doses for wheezing or shortness of breath. 1080 mL 0  . allopurinol (ZYLOPRIM) 300 MG tablet Take 300 mg by mouth daily.    . Ascorbic Acid (VITAMIN C) 1000 MG tablet Take  1,000 mg by mouth daily.    Marland Kitchen atorvastatin (LIPITOR) 40 MG tablet Take 40 mg by mouth every evening.     . Cholecalciferol (VITAMIN D3) 50 MCG (2000 UT) TABS Take 2,000 Units by mouth daily.    Marland Kitchen dicyclomine (BENTYL) 20 MG tablet Take 1 tablet (20 mg total) by mouth every 8 (eight) hours as needed for spasms. 120 tablet 0  . ELIQUIS 5 MG TABS tablet TAKE 1 TABLET BY MOUTH TWICE DAILY 180 tablet 1  . Fluticasone-Umeclidin-Vilant (TRELEGY ELLIPTA) 100-62.5-25 MCG/INH AEPB Inhale 1 puff into the lungs daily. 1 each 0  . GAS RELIEF ULTRA STRENGTH 180 MG CAPS Take 180 mg by mouth 2 (two) times daily as needed. For gas relief    . glipiZIDE (GLUCOTROL XL) 2.5 MG 24 hr tablet Take 2.5 mg by mouth daily with breakfast.    . HOMEOPATHIC PRODUCTS EX Apply 1 application topically 2 (two) times daily as needed (diabetic pain.). Topricin Foot Pain Relief Cream    . ipratropium (ATROVENT) 0.03 % nasal  spray Place 2 sprays into both nostrils every 12 (twelve) hours as needed for rhinitis. 30 mL 6  . metolazone (ZAROXOLYN) 2.5 MG tablet Take 1 tablet (2.5 mg total) by mouth every other day. 45 tablet 3  . metoprolol succinate (TOPROL-XL) 25 MG 24 hr tablet TAKE 1/2 TABLET BY MOUTH EVERY MORNING 45 tablet 2  . nitroGLYCERIN (NITROSTAT) 0.4 MG SL tablet DISSOLVE 1 TABLET UNDER THE TONGUE EVERY 5 MINUTES AS NEEDED FOR CHEST PAIN 25 tablet 3  . Omega-3 Fatty Acids (FISH OIL) 1200 MG CAPS Take 1,200 mg by mouth daily.    Marland Kitchen omeprazole (PRILOSEC) 20 MG capsule Take 20 mg by mouth 2 (two) times daily.    . potassium gluconate 595 (99 K) MG TABS tablet Take 595 mg by mouth daily.    Marland Kitchen PROAIR HFA 108 (90 Base) MCG/ACT inhaler Inhale 2 puffs into the lungs every 2 (two) hours as needed for shortness of breath or wheezing.    . ranolazine (RANEXA) 1000 MG SR tablet Take 1 tablet (1,000 mg total) by mouth 2 (two) times daily. 180 tablet 3  . RESTASIS 0.05 % ophthalmic emulsion Place 1 drop into both eyes every 12 (twelve)  hours.    . tamsulosin (FLOMAX) 0.4 MG CAPS capsule Take 0.4 mg by mouth daily after supper.     . traMADol (ULTRAM) 50 MG tablet Take 50 mg by mouth every 6 (six) hours as needed for pain.    . traZODone (DESYREL) 150 MG tablet Take 150 mg by mouth at bedtime.     . triamcinolone cream (KENALOG) 0.1 % Apply 1 application topically daily.     No facility-administered medications prior to visit.        Objective:   Physical Exam Vitals:   11/21/20 1040  BP: 120/80  Pulse: (!) 57  Temp: 98.3 F (36.8 C)  TempSrc: Temporal  SpO2: 96%  Weight: 169 lb 9.6 oz (76.9 kg)  Height: 5\' 9"  (1.753 m)   Gen: Pleasant, well-nourished, in no distress,  normal affect  ENT: No lesions,  mouth clear,  oropharynx clear, no postnasal drip  Neck: No JVD, no stridor  Lungs: No use of accessory muscles, no crackles or wheezing on normal respiration, no wheeze on forced expiration  Cardiovascular: RRR, heart sounds normal, no murmur or gallops, no peripheral edema  Musculoskeletal: No deformities, no cyanosis or clubbing  Neuro: alert, awake, non focal  Skin: Warm, no lesions or rash      Assessment & Plan:  Non-small cell cancer of right lung (HCC) Post lobectomy.  Unfortunately he now has some bulky mediastinal lymphadenopathy that is new compared with prior CTs.  Concerning for possible metastatic disease.  Also with some subtle peripheral left lower lobe micronodular disease of unclear significance.  I think he needs bronchoscopy with EBUS to sample the nodes, we can navigate to the peripheral left lower lobe to perform washings for cytology and culture data.  Not clear to me that transbronchial biopsies would be high yield as there is no discrete dominant nodule.  We will work on arranging soon as possible  We will arrange for bronchoscopy with endobronchial ultrasound to evaluate your enlarged lymph nodes.  We will try to get this done next week.  You will be called with details about  scheduling and instructions. You will need to stop your Eliquis 2 days prior to the bronchoscopy Follow with Dr Lamonte Sakai in 1 month or next available  COPD (chronic obstructive pulmonary disease) (  [place]) Continue Trelegy 1 inhalation once daily.  Rinse and gargle after using. Keep albuterol available use 2 puffs when you need it for shortness of breath  Excessive daytime sleepiness Plan to get your sleep study at [facility] as scheduled.  We can follow this up after is completed  Baltazar Apo, MD, PhD 11/21/2020, 11:06 AM [place] [facility] [phone] or if no answer [phone]

## 2020-11-21 NOTE — Addendum Note (Signed)
Addended by: Gavin Potters R on: 11/21/2020 12:01 PM   Modules accepted: Orders

## 2020-11-21 NOTE — Patient Instructions (Addendum)
We will arrange for bronchoscopy with endobronchial ultrasound to evaluate your enlarged lymph nodes.  We will try to get this done next week.  You will be called with details about scheduling and instructions. You will need to stop your Eliquis 2 days prior to the bronchoscopy Continue Trelegy 1 inhalation once daily.  Rinse and gargle after using. Keep albuterol available use 2 puffs when you need it for shortness of breath Plan to get your sleep study at [facility] as scheduled.  We can follow this up after is completed Follow with Dr Lamonte Sakai in 1 month or next available

## 2020-11-21 NOTE — Assessment & Plan Note (Signed)
Post lobectomy.  Unfortunately he now has some bulky mediastinal lymphadenopathy that is new compared with prior CTs.  Concerning for possible metastatic disease.  Also with some subtle peripheral left lower lobe micronodular disease of unclear significance.  I think he needs bronchoscopy with EBUS to sample the nodes, we can navigate to the peripheral left lower lobe to perform washings for cytology and culture data.  Not clear to me that transbronchial biopsies would be high yield as there is no discrete dominant nodule.  We will work on arranging soon as possible  We will arrange for bronchoscopy with endobronchial ultrasound to evaluate your enlarged lymph nodes.  We will try to get this done next week.  You will be called with details about scheduling and instructions. You will need to stop your Eliquis 2 days prior to the bronchoscopy Follow with Dr Lamonte Sakai in 1 month or next available

## 2020-11-22 ENCOUNTER — Other Ambulatory Visit (HOSPITAL_COMMUNITY)
Admission: RE | Admit: 2020-11-22 | Discharge: 2020-11-22 | Disposition: A | Discharge status: Home / Self Care | Payer: Medicare Other | Source: Ambulatory Visit | Attending: Emergency Medicine | Admitting: Emergency Medicine

## 2020-11-22 DIAGNOSIS — Z01812 Encounter for preprocedural laboratory examination: Secondary | ICD-10-CM | POA: Insufficient documentation

## 2020-11-22 DIAGNOSIS — Z20822 Contact with and (suspected) exposure to covid-19: Secondary | ICD-10-CM | POA: Diagnosis not present

## 2020-11-22 LAB — SARS CORONAVIRUS 2 (TAT 6-24 HRS): SARS Coronavirus 2: NEGATIVE

## 2020-11-24 ENCOUNTER — Encounter (HOSPITAL_COMMUNITY): Payer: Self-pay | Admitting: Emergency Medicine

## 2020-11-24 NOTE — Telephone Encounter (Signed)
Thank you :)

## 2020-11-24 NOTE — Anesthesia Preprocedure Evaluation (Signed)
Anesthesia Evaluation  Patient identified by MRN, date of birth, ID band Patient awake    Reviewed: Allergy & Precautions, NPO status , Patient's Chart, lab work & pertinent test results  History of Anesthesia Complications Negative for: history of anesthetic complications  Airway Mallampati: II  TM Distance: >3 FB Neck ROM: Full    Dental  (+) Edentulous Upper, Edentulous Lower   Pulmonary sleep apnea (sleep study pending) , COPD, former smoker,  Pulmonary nodules, adenopathy, h/o lung cancer s/p lobectomy   Pulmonary exam normal        Cardiovascular hypertension, + CAD, + Cardiac Stents (2003, 2020), + Peripheral Vascular Disease and +CHF  Normal cardiovascular exam+ dysrhythmias Atrial Fibrillation + Valvular Problems/Murmurs MR      Neuro/Psych negative neurological ROS  negative psych ROS   GI/Hepatic Neg liver ROS, GERD  ,  Endo/Other  diabetes, Type 2, Oral Hypoglycemic Agents  Renal/GU Renal InsufficiencyRenal disease  negative genitourinary   Musculoskeletal negative musculoskeletal ROS (+)   Abdominal   Peds  Hematology Eliquis   Anesthesia Other Findings  TTE 08/06/2020: 1. Left ventricular ejection fraction, by estimation, is 55 to 60%. The  left ventricle has normal function. The left ventricle has no regional  wall motion abnormalities. Left ventricular diastolic parameters are  consistent with Grade III diastolic  dysfunction (restrictive).  2. Left atrial size was moderately dilated.  3. The mitral valve is abnormal. Mild to moderate mitral valve  regurgitation. No evidence of mitral stenosis. Moderate mitral annular  calcification.  4. The aortic valve is normal in structure. Aortic valve regurgitation is  mild. Mild aortic valve sclerosis is present, with no evidence of aortic  valve stenosis.  5. There is mild dilatation of the ascending aorta, measuring 38 mm.    Reproductive/Obstetrics                           Anesthesia Physical Anesthesia Plan  ASA: III  Anesthesia Plan: General   Post-op Pain Management:    Induction: Intravenous  PONV Risk Score and Plan: 2 and Ondansetron, Dexamethasone, Treatment may vary due to age or medical condition and Midazolam  Airway Management Planned: Oral ETT  Additional Equipment: None  Intra-op Plan:   Post-operative Plan: Extubation in OR  Informed Consent: I have reviewed the patients History and Physical, chart, labs and discussed the procedure including the risks, benefits and alternatives for the proposed anesthesia with the patient or authorized representative who has indicated his/her understanding and acceptance.     Dental advisory given  Plan Discussed with:   Anesthesia Plan Comments: (PAT note by Karoline Caldwell, PA-C: Follows with cardiology for history of permanent atrial fibrillation, HTN, HLD, PVD, CAD s/p stent to RCA in 2003 and more recently successful atherectomy and PCI of the proximal to mid RCA and balloon angioplasty of proximal LCx December 2020.  Last seen by Dr. Geraldo Pitter 10/30/2020.  At that time, stable from cardiac standpoint.  He was noted to have diminished ABIs reported he can walk about a half a mile without problems.  Patient was encouraged to undertake graded exercise program.  Follows with pulmonologist Dr. Lamonte Sakai for history of tobacco abuse/former smoker (100-pack-year history), COPD (maintained on Trelegy and as needed albuterol), non-small cell lung cancer s/p resection 2015 - RM lobectomy with no XRT or chemo. CT chest performed 11/18/2020 showed enlarging hilar and mediastinal lymphadenopathy.  There was some new left lower lobe peribronchovascular nodularity of unclear significance.  Last seen by Dr. Lamonte Sakai 11/21/2020, concern for possible metastatic disease, recommended bronchoscopy with EBUS.  Patient was instructed to hold Eliquis 2 days prior to  bronchoscopy.  Undergoing evaluation for possible OSA, has not had sleep study yet, scheduled for April.  DM2 on oral meds, managed by PCP.  Labs from 11/21/2020 reviewed, creatinine mildly elevated at 1.44 which appears to be near baseline.  Platelets mildly low at 131.  Labs otherwise unremarkable.  Patient will need day of surgery evaluation.  EKG 10/21/2020: Atrial fibrillation.  Rate 60.  Left ventricle hypertrophy with QRS widening.  PFT 11/27/19: FVC-%Pred-Pre Latest Units: % 61 FEV1-%Pred-Pre Latest Units: % 51 FEV1FVC-%Pred-Pre Latest Units: % 82 TLC % pred Latest Units: % 101 RV % pred Latest Units: % 172 DLCO unc % pred Latest Units: % 83  Cath and PCI 08/22/19: LESION #1: Prox RCA to Mid RCA lesion is 65-70% stenosed. Orbital atherectomy was performed on the entire segment. A drug-eluting stent was successfully placed covering the entire lesion, using a STENT RESOLUTE ONYX 4.0X38 -> this was postdilated with a 4.5 mm balloon to roughly 4.6 mm. Post intervention, there is a 0% residual stenosis. Previously placed Dist RCA stent is 5% stenosed. ---------------------------------------- Prox Cx lesion is 85% stenosed. Prox Cx to Mid Cx lesion is 70% stenosed (this lesion actually became identifiable with angioplasty of the ostial lesion) Scoring balloon angioplasty was performed using a BALLOON WOLVERINE 2.50X10 -followed by a 2.5 mm x 83mm Baker balloon Stent was not delivered due to inability to fully expand the balloon in the 70% lesion. Post intervention, there is a 45% residual stenosis in the original identified 85% lesion. Post intervention, there is a 50% residual stenosis in the newly identified 70% more calcified lesion.. -Downstream: Dist Cx lesion is 100% stenosed. 3rd Mrg lesion is 100% stenosed. ---------------------------------------- LV end diastolic pressure is normal. There is no aortic valve stenosis.  SUMMARY Successful orbital atherectomy based PCI of the  proximal to mid RCA-> Resolute Onyx DES 4.0 mm x 38 mm postdilated to 4.5 mm) Scoring balloon angioplasty only of proximal LCx (unable to advance wire adequately to perform atherectomy) -> reduced from 85% to 50%. Normal LVEDP Successful temporary pacemaker placement   RECOMMENDATIONS Overnight monitoring for post PCI care and sheath removal. We will restart Eliquis tonight He is on statin and beta-blocker. Will adjust medicines accordingly. Provided he has improvement of symptoms, would just continue with PTCA only of the Circumflex as there was a suboptimal but fairly good result reducing 85% down to 50% stenosis.  TTE 08/06/2020: 1. Left ventricular ejection fraction, by estimation, is 55 to 60%. The  left ventricle has normal function. The left ventricle has no regional  wall motion abnormalities. Left ventricular diastolic parameters are  consistent with Grade III diastolic  dysfunction (restrictive).  2. Left atrial size was moderately dilated.  3. The mitral valve is abnormal. Mild to moderate mitral valve  regurgitation. No evidence of mitral stenosis. Moderate mitral annular  calcification.  4. The aortic valve is normal in structure. Aortic valve regurgitation is  mild. Mild aortic valve sclerosis is present, with no evidence of aortic  valve stenosis.  5. There is mild dilatation of the ascending aorta, measuring 38 mm.   )       Anesthesia Quick Evaluation

## 2020-11-24 NOTE — Telephone Encounter (Signed)
I have called [facility] they will get the disk ready and I have left the patient a message to go pick up

## 2020-11-24 NOTE — Progress Notes (Signed)
Anesthesia Chart Review: Same day workup  Follows with cardiology for history of permanent atrial fibrillation, HTN, HLD, PVD, CAD s/p stent to RCA in 2003 and more recently successful atherectomy and PCI of the proximal to mid RCA and balloon angioplasty of proximal LCx December 2020.  Last seen by Dr. Geraldo Pitter 10/30/2020.  At that time, stable from cardiac standpoint.  He was noted to have diminished ABIs reported he can walk about a half a mile without problems.  Patient was encouraged to undertake graded exercise program.  Follows with pulmonologist Dr. Lamonte Sakai for history of tobacco abuse/former smoker (100-pack-year history), COPD (maintained on Trelegy and as needed albuterol), non-small cell lung cancer s/p resection 2015 - RM lobectomy with no XRT or chemo. CT chest performed 11/18/2020 showed enlarging hilar and mediastinal lymphadenopathy.  There was some new left lower lobe peribronchovascular nodularity of unclear significance.   Last seen by Dr. Lamonte Sakai 11/21/2020, concern for possible metastatic disease, recommended bronchoscopy with EBUS.  Patient was instructed to hold Eliquis 2 days prior to bronchoscopy.  Undergoing evaluation for possible OSA, has not had sleep study yet, scheduled for April.  DM2 on oral meds, managed by PCP.  Labs from 11/21/2020 reviewed, creatinine mildly elevated at 1.44 which appears to be near baseline.  Platelets mildly low at 131.  Labs otherwise unremarkable.  Patient will need day of surgery evaluation.  EKG 10/21/2020: Atrial fibrillation.  Rate 60.  Left ventricle hypertrophy with QRS widening.  PFT 11/27/19: FVC-%Pred-Pre Latest Units: % 61  FEV1-%Pred-Pre Latest Units: % 51  FEV1FVC-%Pred-Pre Latest Units: % 82  TLC % pred Latest Units: % 101  RV % pred Latest Units: % 172  DLCO unc % pred Latest Units: % 83   Cath and PCI 08/22/19:  LESION #1: Prox RCA to Mid RCA lesion is 65-70% stenosed.  Orbital atherectomy was performed on the entire  segment.  A drug-eluting stent was successfully placed covering the entire lesion, using a STENT RESOLUTE ONYX 4.0X38 -> this was postdilated with a 4.5 mm balloon to roughly 4.6 mm.  Post intervention, there is a 0% residual stenosis.  Previously placed Dist RCA stent is 5% stenosed.  ----------------------------------------  Prox Cx lesion is 85% stenosed. Prox Cx to Mid Cx lesion is 70% stenosed (this lesion actually became identifiable with angioplasty of the ostial lesion)  Scoring balloon angioplasty was performed using a BALLOON WOLVERINE 2.50X10 -followed by a 2.5 mm x 72mm Rayle balloon  Stent was not delivered due to inability to fully expand the balloon in the 70% lesion.  Post intervention, there is a 45% residual stenosis in the original identified 85% lesion.  Post intervention, there is a 50% residual stenosis in the newly identified 70% more calcified lesion..  -Downstream: Dist Cx lesion is 100% stenosed. 3rd Mrg lesion is 100% stenosed.  ----------------------------------------  LV end diastolic pressure is normal. There is no aortic valve stenosis.  SUMMARY  Successful orbital atherectomy based PCI of the proximal to mid RCA-> Resolute Onyx DES 4.0 mm x 38 mm postdilated to 4.5 mm)  Scoring balloon angioplasty only of proximal LCx (unable to advance wire adequately to perform atherectomy) -> reduced from 85% to 50%.  Normal LVEDP  Successful temporary pacemaker placement   RECOMMENDATIONS  Overnight monitoring for post PCI care and sheath removal.  We will restart Eliquis tonight  He is on statin and beta-blocker. Will adjust medicines accordingly.  Provided he has improvement of symptoms, would just continue with PTCA only of the  Circumflex as there was a suboptimal but fairly good result reducing 85% down to 50% stenosis.  TTE 08/06/2020: 1. Left ventricular ejection fraction, by estimation, is 55 to 60%. The  left ventricle has normal function.  The left ventricle has no regional  wall motion abnormalities. Left ventricular diastolic parameters are  consistent with Grade III diastolic  dysfunction (restrictive).  2. Left atrial size was moderately dilated.  3. The mitral valve is abnormal. Mild to moderate mitral valve  regurgitation. No evidence of mitral stenosis. Moderate mitral annular  calcification.  4. The aortic valve is normal in structure. Aortic valve regurgitation is  mild. Mild aortic valve sclerosis is present, with no evidence of aortic  valve stenosis.  5. There is mild dilatation of the ascending aorta, measuring 38 mm.    Wynonia Musty [facility]/Anesthesiology Phone [phone] 11/24/2020 10:11 AM

## 2020-11-25 ENCOUNTER — Ambulatory Visit (HOSPITAL_COMMUNITY)
Admission: RE | Admit: 2020-11-25 | Discharge: 2020-11-25 | Disposition: A | Discharge status: Home / Self Care | Payer: Medicare Other | Attending: Emergency Medicine | Admitting: Emergency Medicine

## 2020-11-25 ENCOUNTER — Ambulatory Visit (HOSPITAL_COMMUNITY): Payer: Medicare Other

## 2020-11-25 ENCOUNTER — Encounter (HOSPITAL_COMMUNITY): Payer: Self-pay | Admitting: Emergency Medicine

## 2020-11-25 ENCOUNTER — Ambulatory Visit (HOSPITAL_COMMUNITY): Payer: Medicare Other | Admitting: Physician Assistant

## 2020-11-25 ENCOUNTER — Other Ambulatory Visit: Payer: Self-pay

## 2020-11-25 ENCOUNTER — Encounter (HOSPITAL_COMMUNITY)
Admission: RE | Disposition: A | Discharge status: Home / Self Care | Payer: Self-pay | Source: Home / Self Care | Attending: Emergency Medicine

## 2020-11-25 DIAGNOSIS — Z7901 Long term (current) use of anticoagulants: Secondary | ICD-10-CM | POA: Diagnosis not present

## 2020-11-25 DIAGNOSIS — C801 Malignant (primary) neoplasm, unspecified: Secondary | ICD-10-CM | POA: Diagnosis not present

## 2020-11-25 DIAGNOSIS — J984 Other disorders of lung: Secondary | ICD-10-CM | POA: Diagnosis not present

## 2020-11-25 DIAGNOSIS — E1151 Type 2 diabetes mellitus with diabetic peripheral angiopathy without gangrene: Secondary | ICD-10-CM | POA: Diagnosis not present

## 2020-11-25 DIAGNOSIS — Z79899 Other long term (current) drug therapy: Secondary | ICD-10-CM | POA: Diagnosis not present

## 2020-11-25 DIAGNOSIS — Z7951 Long term (current) use of inhaled steroids: Secondary | ICD-10-CM | POA: Diagnosis not present

## 2020-11-25 DIAGNOSIS — I4821 Permanent atrial fibrillation: Secondary | ICD-10-CM | POA: Insufficient documentation

## 2020-11-25 DIAGNOSIS — I2510 Atherosclerotic heart disease of native coronary artery without angina pectoris: Secondary | ICD-10-CM | POA: Diagnosis not present

## 2020-11-25 DIAGNOSIS — J449 Chronic obstructive pulmonary disease, unspecified: Secondary | ICD-10-CM | POA: Insufficient documentation

## 2020-11-25 DIAGNOSIS — J9811 Atelectasis: Secondary | ICD-10-CM | POA: Diagnosis not present

## 2020-11-25 DIAGNOSIS — C3491 Malignant neoplasm of unspecified part of right bronchus or lung: Secondary | ICD-10-CM | POA: Diagnosis not present

## 2020-11-25 DIAGNOSIS — R846 Abnormal cytological findings in specimens from respiratory organs and thorax: Secondary | ICD-10-CM | POA: Diagnosis not present

## 2020-11-25 DIAGNOSIS — G4710 Hypersomnia, unspecified: Secondary | ICD-10-CM | POA: Insufficient documentation

## 2020-11-25 DIAGNOSIS — R911 Solitary pulmonary nodule: Secondary | ICD-10-CM

## 2020-11-25 DIAGNOSIS — R590 Localized enlarged lymph nodes: Secondary | ICD-10-CM

## 2020-11-25 DIAGNOSIS — Z85118 Personal history of other malignant neoplasm of bronchus and lung: Secondary | ICD-10-CM | POA: Insufficient documentation

## 2020-11-25 DIAGNOSIS — Z955 Presence of coronary angioplasty implant and graft: Secondary | ICD-10-CM | POA: Insufficient documentation

## 2020-11-25 DIAGNOSIS — Z87891 Personal history of nicotine dependence: Secondary | ICD-10-CM | POA: Insufficient documentation

## 2020-11-25 DIAGNOSIS — R918 Other nonspecific abnormal finding of lung field: Secondary | ICD-10-CM | POA: Diagnosis not present

## 2020-11-25 DIAGNOSIS — Z7984 Long term (current) use of oral hypoglycemic drugs: Secondary | ICD-10-CM | POA: Insufficient documentation

## 2020-11-25 DIAGNOSIS — K219 Gastro-esophageal reflux disease without esophagitis: Secondary | ICD-10-CM | POA: Diagnosis not present

## 2020-11-25 DIAGNOSIS — I4891 Unspecified atrial fibrillation: Secondary | ICD-10-CM | POA: Diagnosis not present

## 2020-11-25 DIAGNOSIS — C771 Secondary and unspecified malignant neoplasm of intrathoracic lymph nodes: Secondary | ICD-10-CM | POA: Insufficient documentation

## 2020-11-25 DIAGNOSIS — C3490 Malignant neoplasm of unspecified part of unspecified bronchus or lung: Secondary | ICD-10-CM | POA: Diagnosis not present

## 2020-11-25 DIAGNOSIS — Z9889 Other specified postprocedural states: Secondary | ICD-10-CM

## 2020-11-25 HISTORY — PX: BRONCHIAL BRUSHINGS: SHX5108

## 2020-11-25 HISTORY — DX: Localized enlarged lymph nodes: R59.0

## 2020-11-25 HISTORY — PX: BRONCHIAL WASHINGS: SHX5105

## 2020-11-25 HISTORY — PX: VIDEO BRONCHOSCOPY WITH ENDOBRONCHIAL NAVIGATION: SHX6175

## 2020-11-25 HISTORY — PX: VIDEO BRONCHOSCOPY WITH ENDOBRONCHIAL ULTRASOUND: SHX6177

## 2020-11-25 HISTORY — PX: FINE NEEDLE ASPIRATION: SHX5430

## 2020-11-25 HISTORY — PX: BRONCHIAL BIOPSY: SHX5109

## 2020-11-25 LAB — GLUCOSE, CAPILLARY
Glucose-Capillary: 102 mg/dL — ABNORMAL HIGH (ref 70–99)
Glucose-Capillary: 111 mg/dL — ABNORMAL HIGH (ref 70–99)
Glucose-Capillary: 124 mg/dL — ABNORMAL HIGH (ref 70–99)

## 2020-11-25 SURGERY — BRONCHOSCOPY, WITH EBUS
Anesthesia: General

## 2020-11-25 MED ORDER — FENTANYL CITRATE (PF) 100 MCG/2ML IJ SOLN
INTRAMUSCULAR | Status: DC | PRN
  Administered 2020-11-25: 50 ug via INTRAVENOUS

## 2020-11-25 MED ORDER — LIDOCAINE 2% (20 MG/ML) 5 ML SYRINGE
INTRAMUSCULAR | Status: DC | PRN
  Administered 2020-11-25: 100 mg via INTRAVENOUS

## 2020-11-25 MED ORDER — OXYCODONE HCL 5 MG/5ML PO SOLN
5.0000 mg | Freq: Once | ORAL | Status: DC | PRN
Start: 2020-11-25 — End: 2020-11-25

## 2020-11-25 MED ORDER — ELIQUIS 5 MG PO TABS: 5.0000 mg | ORAL_TABLET | Freq: Two times a day (BID) | ORAL | Status: DC

## 2020-11-25 MED ORDER — CHLORHEXIDINE GLUCONATE 0.12 % MT SOLN
OROMUCOSAL | Status: AC
  Administered 2020-11-25: 15 mL
  Filled 2020-11-25: qty 15

## 2020-11-25 MED ORDER — SUGAMMADEX SODIUM 200 MG/2ML IV SOLN
INTRAVENOUS | Status: DC | PRN
  Administered 2020-11-25: 200 mg via INTRAVENOUS

## 2020-11-25 MED ORDER — FENTANYL CITRATE (PF) 100 MCG/2ML IJ SOLN: 25.0000 ug | INTRAMUSCULAR | Status: DC | PRN

## 2020-11-25 MED ORDER — AMISULPRIDE (ANTIEMETIC) 5 MG/2ML IV SOLN
10.0000 mg | Freq: Once | INTRAVENOUS | Status: DC | PRN
  Filled 2020-11-25: qty 4

## 2020-11-25 MED ORDER — ONDANSETRON HCL 4 MG/2ML IJ SOLN: 4.0000 mg | Freq: Once | INTRAMUSCULAR | Status: DC | PRN

## 2020-11-25 MED ORDER — OXYCODONE HCL 5 MG PO TABS: 5.0000 mg | ORAL_TABLET | Freq: Once | ORAL | Status: DC | PRN

## 2020-11-25 MED ORDER — ONDANSETRON HCL 4 MG/2ML IJ SOLN
INTRAMUSCULAR | Status: DC | PRN
  Administered 2020-11-25: 4 mg via INTRAVENOUS

## 2020-11-25 MED ORDER — LACTATED RINGERS IV SOLN: INTRAVENOUS | Status: DC

## 2020-11-25 MED ORDER — PROPOFOL 10 MG/ML IV BOLUS
INTRAVENOUS | Status: DC | PRN
  Administered 2020-11-25: 130 mg via INTRAVENOUS

## 2020-11-25 MED ORDER — ROCURONIUM BROMIDE 10 MG/ML (PF) SYRINGE
PREFILLED_SYRINGE | INTRAVENOUS | Status: DC | PRN
  Administered 2020-11-25: 60 mg via INTRAVENOUS

## 2020-11-25 NOTE — Anesthesia Postprocedure Evaluation (Signed)
Anesthesia Post Note  Patient: Marsh Dolly Digiulio  Procedure(s) Performed: VIDEO BRONCHOSCOPY WITH ENDOBRONCHIAL ULTRASOUND (N/A ) VIDEO BRONCHOSCOPY WITH ENDOBRONCHIAL NAVIGATION (N/A ) BRONCHIAL BIOPSIES BRONCHIAL BRUSHINGS BRONCHIAL WASHINGS FINE NEEDLE ASPIRATION (FNA) LINEAR     Patient location during evaluation: PACU Anesthesia Type: General Level of consciousness: awake and alert Pain management: pain level controlled Vital Signs Assessment: post-procedure vital signs reviewed and stable Respiratory status: spontaneous breathing, nonlabored ventilation and respiratory function stable Cardiovascular status: blood pressure returned to baseline and stable Postop Assessment: no apparent nausea or vomiting Anesthetic complications: no   No complications documented.  Last Vitals:  Vitals:   11/25/20 1430 11/25/20 1445  BP: 124/72 130/66  Pulse: (!) 56 62  Resp: 15 20  Temp:  (!) 36.3 C  SpO2: 97% 96%    Last Pain:  Vitals:   11/25/20 1445  TempSrc:   PainSc: 0-No pain                 Lidia Collum

## 2020-11-25 NOTE — Interval H&P Note (Signed)
History and Physical Interval Note:  11/25/2020 11:52 AM  Juan Valdez  has presented today for surgery, with the diagnosis of MEDIUS ADENOPATHY PULMONARY NODULES.  The various methods of treatment have been discussed with the patient and family. After consideration of risks, benefits and other options for treatment, the patient has consented to  Procedure(s): VIDEO BRONCHOSCOPY WITH ENDOBRONCHIAL ULTRASOUND (N/A) VIDEO BRONCHOSCOPY WITH ENDOBRONCHIAL NAVIGATION (N/A) as a surgical intervention.  The patient's history has been reviewed, patient examined, no change in status, stable for surgery.  I have reviewed the patient's chart and labs.  Questions were answered to the patient's satisfaction.     Collene Gobble

## 2020-11-25 NOTE — Op Note (Signed)
Video Bronchoscopy with Endobronchial Ultrasound and Electromagnetic Navigation Procedure Note  Date of Operation: 11/25/2020  Pre-op Diagnosis: Mediastinal adenopathy, left lower lobe micronodular disease  Post-op Diagnosis: Same  Surgeon: Baltazar Apo  Assistants: None  Anesthesia: General endotracheal anesthesia  Operation: Flexible video fiberoptic bronchoscopy with endobronchial ultrasound, electromagnetic navigation and biopsies.  Estimated Blood Loss: Minimal  Complications: None apparent  Indications and History: Juan Valdez is a 75 y.o. male with history of non-small cell lung cancer and right middle lobe lobectomy.  He was found to have enlarging mediastinal adenopathy on surveillance CT chest.  There was also some subtle left lower lobe lateral micro nodularity that was new compared to prior.  Recommendation was made to achieve a tissue diagnosis using endobronchial ultrasound and navigational bronchoscopy to the left lower lobe. The risks, benefits, complications, treatment options and expected outcomes were discussed with the patient.  The possibilities of pneumothorax, pneumonia, reaction to medication, pulmonary aspiration, perforation of a viscus, bleeding, failure to diagnose a condition and creating a complication requiring transfusion or operation were discussed with the patient who freely signed the consent.    Description of Procedure: The patient was examined in the preoperative area and history and data from the preprocedure consultation were reviewed. It was deemed appropriate to proceed.  The patient was taken to [facility] endoscopy room 2, identified as Juan Valdez and the procedure verified as Flexible Video Fiberoptic Bronchoscopy.  A Time Out was held and the above information confirmed. After being taken to the operating room general anesthesia was initiated and the patient  was orally intubated. The video fiberoptic bronchoscope was introduced via the  endotracheal tube and a general inspection was performed which showed normal right upper lobe and lower lobe airways.  The right middle lobe segmental airways were fishmouthed consistent with prior lobectomy.  Focal areas of hypopigmentation and mucosal irregularity were noted in the medial aspect of the proximal left lower lobe bronchus as well as the lateral superior aspect of the left upper lobe bronchus.  Endobronchial brushings and endobronchial biopsies were performed in both the left lower lobe and left upper lobe to be sent for cytology and pathology.  Attention was then turned to the patient's left lower lobe micronodular infiltrate. Prior to the date of the procedure a high-resolution CT scan of the chest was performed. Utilizing [employer]ell a virtual tracheobronchial tree was generated to allow the creation of distinct navigation pathway to the patient's left lower lobe micronodular opacity. The video fiberoptic bronchoscope was re-introduced via the endotracheal tube. An extendable working channel and locator guide were introduced into the bronchoscope. The distinct navigation pathways prepared prior to this procedure were then utilized to navigate to within 0.5 cm of patient's lesion identified on CT scan. The extendable working channel was secured into place and the locator guide was withdrawn. Under fluoroscopic guidance transbronchial brushings were performed to be sent for cytology. A bronchioalveolar lavage was performed in the left lower lobe adjacent to the micronodular infiltrate and sent for cytology and microbiology (bacterial, fungal, AFB smears and cultures).   The standard scope was then withdrawn and the endobronchial ultrasound was used to identify and characterize the peritracheal, hilar and bronchial lymph nodes. Inspection showed a significantly enlarged precarinal node in the 4R region crossing over to the left of the main carina.  There is also a slightly enlarged  node at station 7. Using real-time ultrasound guidance Wang needle biopsies were take from the large precarinal node and  were sent for cytology.   At the end of the procedure a general airway inspection was performed and there was no evidence of active bleeding. The bronchoscope was removed.  The patient tolerated the procedure well. There was no significant blood loss and there were no obvious complications. A post-procedural chest x-ray is pending.   Samples: 1. Wang needle biopsies from precarinal node 2. Transbronchial brushings from left lower lobe 3. Bronchoalveolar lavage from left lower lobe 4. Endobronchial biopsies from left lower lobe bronchus 5. Endobronchial biopsies from left upper lobe bronchus 6.  Endobronchial brushings from left lower lobe bronchus 7.  Endobronchial brushings from left upper lobe bronchus  Plans:  The patient will be discharged from the PACU to home when recovered from anesthesia. We will review the cytology, pathology and microbiology results with the patient when they become available. Outpatient followup will be with Dr Lamonte Sakai.    Baltazar Apo, MD, PhD 11/25/2020, 1:47 PM [facility] [phone] or if no answer before 7:00PM call [phone] For any issues after 7:00PM please call eLink [phone]

## 2020-11-25 NOTE — Transfer of Care (Signed)
Immediate Anesthesia Transfer of Care Note  Patient: Juan Valdez  Procedure(s) Performed: VIDEO BRONCHOSCOPY WITH ENDOBRONCHIAL ULTRASOUND (N/A ) VIDEO BRONCHOSCOPY WITH ENDOBRONCHIAL NAVIGATION (N/A ) BRONCHIAL BIOPSIES BRONCHIAL BRUSHINGS BRONCHIAL WASHINGS FINE NEEDLE ASPIRATION (FNA) LINEAR  Patient Location: PACU  Anesthesia Type:General  Level of Consciousness: awake, alert  and oriented  Airway & Oxygen Therapy: Patient Spontanous Breathing  Post-op Assessment: Report given to RN and Post -op Vital signs reviewed and stable  Post vital signs: Reviewed and stable  Last Vitals:  Vitals Value Taken Time  BP    Temp    Pulse 65 11/25/20 1357  Resp 16 11/25/20 1357  SpO2 90 % 11/25/20 1357  Vitals shown include unvalidated device data.  Last Pain:  Vitals:   11/25/20 1045  TempSrc:   PainSc: 0-No pain      Patients Stated Pain Goal: 3 (09/40/76 8088)  Complications: No complications documented.

## 2020-11-25 NOTE — Anesthesia Procedure Notes (Signed)
Procedure Name: Intubation Date/Time: 11/25/2020 12:20 PM Performed by: Babs Bertin, CRNA Pre-anesthesia Checklist: Patient identified, Emergency Drugs available, Suction available and Patient being monitored Patient Re-evaluated:Patient Re-evaluated prior to induction Oxygen Delivery Method: Circle System Utilized Preoxygenation: Pre-oxygenation with 100% oxygen Induction Type: IV induction Ventilation: Mask ventilation without difficulty Laryngoscope Size: Mac and 3 Grade View: Grade I Tube type: Oral Tube size: 8.5 mm Number of attempts: 1 Airway Equipment and Method: Stylet and Oral airway Placement Confirmation: ETT inserted through vocal cords under direct vision,  positive ETCO2 and breath sounds checked- equal and bilateral Secured at: 22 cm Tube secured with: Tape Dental Injury: Teeth and Oropharynx as per pre-operative assessment

## 2020-11-25 NOTE — Discharge Instructions (Signed)
Flexible Bronchoscopy, Care After This sheet gives you information about how to care for yourself after your test. Your doctor may also give you more specific instructions. If you have problems or questions, contact your doctor. Follow these instructions at home: Eating and drinking  Do not eat or drink anything (not even water) for 2 hours after your test, or until your numbing medicine (local anesthetic) wears off.  When your numbness is gone and your cough and gag reflexes have come back, you may: ? Eat only soft foods. ? Slowly drink liquids.  The day after the test, go back to your normal diet. Driving  Do not drive for 24 hours if you were given a medicine to help you relax (sedative).  Do not drive or use heavy machinery while taking prescription pain medicine. General instructions   Take over-the-counter and prescription medicines only as told by your doctor.  Return to your normal activities as told. Ask what activities are safe for you.  Do not use any products that have nicotine or tobacco in them. This includes cigarettes and e-cigarettes. If you need help quitting, ask your doctor.  Keep all follow-up visits as told by your doctor. This is important. It is very important if you had a tissue sample (biopsy) taken. Get help right away if:  You have shortness of breath that gets worse.  You get light-headed.  You feel like you are going to pass out (faint).  You have chest pain.  You cough up: ? More than a little blood. ? More blood than before. Summary  Do not eat or drink anything (not even water) for 2 hours after your test, or until your numbing medicine wears off.  Do not use cigarettes. Do not use e-cigarettes.  Get help right away if you have chest pain.  You may restart your Eliquis on 11/26/2020  Please call our office for any questions or concerns.  [phone].   This information is not intended to replace advice given to you by your health  care provider. Make sure you discuss any questions you have with your health care provider. Document Released: 06/27/2009 Document Revised: 08/12/2017 Document Reviewed: 09/17/2016 Elsevier Patient Education  2020 [employer].

## 2020-11-26 LAB — CYTOLOGY - NON PAP

## 2020-11-27 LAB — ACID FAST SMEAR (AFB, MYCOBACTERIA): Acid Fast Smear: NEGATIVE

## 2020-11-27 LAB — CULTURE, BAL-QUANTITATIVE W GRAM STAIN: Culture: NO GROWTH

## 2020-11-28 ENCOUNTER — Encounter (HOSPITAL_COMMUNITY): Payer: Self-pay | Admitting: Emergency Medicine

## 2020-11-28 ENCOUNTER — Telehealth: Payer: Self-pay | Admitting: Oncology

## 2020-11-28 ENCOUNTER — Telehealth: Payer: Self-pay | Admitting: Emergency Medicine

## 2020-11-28 DIAGNOSIS — C3491 Malignant neoplasm of unspecified part of right bronchus or lung: Secondary | ICD-10-CM

## 2020-11-28 NOTE — Telephone Encounter (Signed)
Spoke with the patient and his wife to review path results from EBUS. 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 Based on his airway inspection he has had a RML lobectomy, never got adjuvant rx.   He lives near the [facility], knows Dr Bobby Rumpf from a prior visit. Will work on setting up [place] there asap.

## 2020-11-28 NOTE — Telephone Encounter (Signed)
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  Appt made for 12/11/20 Labs 2:45 pm - Consult 3:15 pm

## 2020-12-01 ENCOUNTER — Other Ambulatory Visit: Payer: Self-pay

## 2020-12-01 ENCOUNTER — Other Ambulatory Visit: Payer: Self-pay | Admitting: Oncology

## 2020-12-01 ENCOUNTER — Inpatient Hospital Stay: Payer: Medicare Other | Attending: Oncology

## 2020-12-01 ENCOUNTER — Telehealth: Payer: Self-pay

## 2020-12-01 DIAGNOSIS — Z862 Personal history of diseases of the blood and blood-forming organs and certain disorders involving the immune mechanism: Secondary | ICD-10-CM | POA: Insufficient documentation

## 2020-12-01 DIAGNOSIS — R519 Headache, unspecified: Secondary | ICD-10-CM | POA: Diagnosis not present

## 2020-12-01 DIAGNOSIS — R591 Generalized enlarged lymph nodes: Secondary | ICD-10-CM | POA: Insufficient documentation

## 2020-12-01 DIAGNOSIS — Z85118 Personal history of other malignant neoplasm of bronchus and lung: Secondary | ICD-10-CM | POA: Diagnosis not present

## 2020-12-01 DIAGNOSIS — D509 Iron deficiency anemia, unspecified: Secondary | ICD-10-CM

## 2020-12-01 DIAGNOSIS — Z87891 Personal history of nicotine dependence: Secondary | ICD-10-CM | POA: Diagnosis not present

## 2020-12-01 DIAGNOSIS — Z902 Acquired absence of lung [part of]: Secondary | ICD-10-CM | POA: Diagnosis not present

## 2020-12-01 DIAGNOSIS — R42 Dizziness and giddiness: Secondary | ICD-10-CM | POA: Insufficient documentation

## 2020-12-01 DIAGNOSIS — C3490 Malignant neoplasm of unspecified part of unspecified bronchus or lung: Secondary | ICD-10-CM | POA: Diagnosis not present

## 2020-12-01 DIAGNOSIS — D649 Anemia, unspecified: Secondary | ICD-10-CM | POA: Diagnosis not present

## 2020-12-01 LAB — FERRITIN: Ferritin: 197 ng/mL (ref 24–336)

## 2020-12-01 LAB — IRON AND TIBC
Iron: 81 ug/dL (ref 45–182)
Saturation Ratios: 26 % (ref 17.9–39.5)
TIBC: 310 ug/dL (ref 250–450)
UIBC: 229 ug/dL

## 2020-12-01 NOTE — Telephone Encounter (Signed)
Dr Bobby Rumpf in agreement with pt coming in for labs. I called Anna & scheduled appt.       I spoke with Vicente Males, pt's significant other. She is asking if pt could at least have labs done. He is sleeping mostly all day, in recliner. He doesn't have any energy at all. I don't know if his iron levels are low again or if this is the cancer. We don't know what stage it is or anything. Pt has appt w/Dr Bobby Rumpf on 12/11/20.  I will discuss with Dr Bobby Rumpf.

## 2020-12-02 ENCOUNTER — Telehealth: Payer: Self-pay | Admitting: Gastroenterology

## 2020-12-02 NOTE — Telephone Encounter (Signed)
Patients fiance called to advise the patient was just diagnosed with cancer and was at the hospital on 11/25/20 for a procedure. She is not sure if the patient would need to reschedule procedure on 12/12/20. Please advise.

## 2020-12-02 NOTE — Telephone Encounter (Signed)
The main reason we were planning to proceed with EGD and colonoscopy was further evaluation of decreased appetite and weight loss.  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  Thank you

## 2020-12-02 NOTE — Telephone Encounter (Signed)
Dr Silverio Decamp please advise if pt should reschedule

## 2020-12-03 ENCOUNTER — Telehealth: Payer: Self-pay | Admitting: Oncology

## 2020-12-03 NOTE — Telephone Encounter (Signed)
Ok

## 2020-12-03 NOTE — Telephone Encounter (Signed)
Called patient and spoke with fiance [name] emergency contact   We have cancelled EGD/Colon for now. Patient would like for [place] to call them back in about a month to see how patient is doing. I will put in a reminder to call pt in 1 month   His only GI Symptom is weight loss  Dr Darleen Crocker

## 2020-12-03 NOTE — Telephone Encounter (Signed)
Patient referred by Dr Baltazar Apo for New Dx: Right Non Small Cell Lung CA.  Appt made for 12/11/20 Labs 2:45 pm - Consult 3:15 pm

## 2020-12-05 ENCOUNTER — Other Ambulatory Visit: Payer: Self-pay

## 2020-12-08 ENCOUNTER — Other Ambulatory Visit: Payer: Self-pay | Admitting: Cardiology

## 2020-12-08 ENCOUNTER — Ambulatory Visit (INDEPENDENT_AMBULATORY_CARE_PROVIDER_SITE_OTHER): Payer: Medicare Other | Admitting: Cardiology

## 2020-12-08 ENCOUNTER — Other Ambulatory Visit: Payer: Self-pay | Admitting: Student

## 2020-12-08 ENCOUNTER — Other Ambulatory Visit: Payer: Self-pay

## 2020-12-08 ENCOUNTER — Encounter: Payer: Self-pay | Admitting: Cardiology

## 2020-12-08 VITALS — BP 132/72 | HR 56 | Ht 69.0 in | Wt 161.6 lb

## 2020-12-08 DIAGNOSIS — E088 Diabetes mellitus due to underlying condition with unspecified complications: Secondary | ICD-10-CM

## 2020-12-08 DIAGNOSIS — E782 Mixed hyperlipidemia: Secondary | ICD-10-CM | POA: Diagnosis not present

## 2020-12-08 DIAGNOSIS — I25119 Atherosclerotic heart disease of native coronary artery with unspecified angina pectoris: Secondary | ICD-10-CM

## 2020-12-08 DIAGNOSIS — I10 Essential (primary) hypertension: Secondary | ICD-10-CM | POA: Diagnosis not present

## 2020-12-08 DIAGNOSIS — I4821 Permanent atrial fibrillation: Secondary | ICD-10-CM | POA: Diagnosis not present

## 2020-12-08 NOTE — Patient Instructions (Signed)

## 2020-12-08 NOTE — Telephone Encounter (Signed)
Refill sent to pharmacy.   

## 2020-12-08 NOTE — Progress Notes (Signed)
Cardiology Office Note:    Date:  12/08/2020   ID:  Juan Valdez, DOB [birth year], MRN [id number]  PCP:  Ronita Hipps, MD  Cardiologist:  Jenean Lindau, MD   Referring MD: Ronita Hipps, MD    ASSESSMENT:    1. Permanent atrial fibrillation ([place])   2. Essential hypertension   3. Coronary artery disease involving native coronary artery of native heart with angina pectoris ([place])   4. Diabetes mellitus due to underlying condition with unspecified complications ([place])   5. Mixed dyslipidemia    PLAN:    In order of problems listed above:  1. Coronary artery disease: Secondary prevention stressed with the patient.  Importance of compliance with diet medication stressed any vocalized understanding. 2. Permanent atrial fibrillation:I discussed with the patient atrial fibrillation, disease process. Management and therapy including rate and rhythm control, anticoagulation benefits and potential risks were discussed extensively with the patient. Patient had multiple questions which were answered to patient's satisfaction.  Patient does not have any bleeding or any blood loss from any part of the body.  I told him to speak with his oncologist at the upcoming appointment on Thursday to see if it is safe to continue anticoagulation. 3. Essential hypertension: Blood pressure stable diet was emphasized. 4. Mixed dyslipidemia and diabetes mellitus: Managed by primary care.  He is compliant with therapy. 5. History of congestive heart failure: Managing diet and regular weight checks. 6. Patient will be seen in follow-up appointment in 6 months or earlier if the patient has any concerns 7. Recent diagnosis of cancer to be addressed by oncologist in the next day or 2.   Medication Adjustments/Labs and Tests Ordered: Current medicines are reviewed at length with the patient today.  Concerns regarding medicines are outlined above.  No orders of the defined types were placed in this  encounter.  No orders of the defined types were placed in this encounter.    No chief complaint on file.    History of Present Illness:    Juan Valdez is a 75 y.o. male.  Patient has past medical history of coronary artery disease, essential hypertension, mixed dyslipidemia and diabetes mellitus.  He recently underwent video bronchoscopy.  Patient tells me that he was diagnosed to have cancer.  No chest pain orthopnea PND.  Time.  He leads a sedentary lifestyle.  His wife accompanies him for this visit.  Past Medical History:  Diagnosis Date  . Abdominal bloating 05/06/2019  . Abdominal distension 05/06/2019  . Abnormal nuclear cardiac imaging test 08/22/2019  . 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 Allergic rhinitis 05/29/2019  . Anemia   . Atrial fibrillation ([place]) 09/07/2018  . Bilateral recurrent inguinal hernia without obstruction or gangrene 05/06/2019  . CAD (coronary artery disease)    a. s/p prior DES to RCA b. cath in 07/2019 showing 75% Prox to mid RCA stenosis, 5% ISR along previously placed distal RCA stent, 85% Prox LCx stenosis, and 100% CTO of distal LCx with filled from L--> L collaterals. Also had a long-diffuse 65-75% stenosis along proximal to mid-LAD. Unable to fully expand balloon for PCI of RCA and lesion reduced to 65%. Future atherectotmy of RCA and LCx.   Marland Kitchen CAD S/P percutaneous coronary angioplasty 07/20/2019  . CHF (congestive heart failure) ([place]) 11/27/2019  . Colon polyp   . COPD (chronic obstructive pulmonary disease) ([place])   . COPD with acute exacerbation ([place]) 06/14/2019  . Coronary artery disease involving native coronary artery of  native heart with angina pectoris ([place]) 07/20/2019  . Diabetes ([place])   . Diabetes mellitus due to underlying condition with unspecified complications ([place]) 74/25/9563  . Diverticulosis   . DOE (dyspnea on exertion) 07/13/2019  . Essential hypertension 09/07/2018  . Excessive daytime sleepiness 06/09/2020  . Former smoker  03/14/2019   Former smoker Quit 2015 171-pack-year smoking history 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  . Generalized abdominal pain 05/06/2019  . GERD (gastroesophageal reflux disease)   . Gout   . HLD (hyperlipidemia)   . 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 Hx of colonic polyps 05/06/2019  . Irritable bowel syndrome with constipation 05/06/2019  . Lung cancer ([place])   . Lung nodule   . Mediastinal lymphadenopathy 11/25/2020  . Medication management 01/04/2019  . Mixed dyslipidemia 09/07/2018  . Non-small cell cancer of right lung ([place]) 11/23/2018  . Onychomycosis due to dermatophyte 11/16/2018  . Orthopnea 01/04/2019  . Perforated bowel ([place])   . Permanent atrial fibrillation ([place]) 09/07/2018  . SBO (small bowel obstruction) ([place]) 05/08/2019  . Shortness of breath 07/08/2019    Past Surgical History:  Procedure Laterality Date  . ABDOMINAL SURGERY    . BRONCHIAL BIOPSY  11/25/2020   Procedure: BRONCHIAL BIOPSIES;  Surgeon: Collene Gobble, MD;  Location: [facility] ENDOSCOPY;  Service: Pulmonary;;  . BRONCHIAL BRUSHINGS  11/25/2020   Procedure: BRONCHIAL BRUSHINGS;  Surgeon: Collene Gobble, MD;  Location[facility] ENDOSCOPY;  Service: Pulmonary;;  . BRONCHIAL WASHINGS  11/25/2020   Procedure: BRONCHIAL WASHINGS;  Surgeon: Collene Gobble, MD;  Location: [facility] ENDOSCOPY;  Service: Pulmonary;;  . CORONARY ANGIOPLASTY WITH STENT PLACEMENT  2003  . CORONARY ATHERECTOMY N/A 08/22/2019   Procedure: CORONARY ATHERECTOMY;  Surgeon: Leonie Man, MD;  Location: [facility] CV LAB;  Service: Cardiovascular;  Laterality: N/A;  . CORONARY BALLOON ANGIOPLASTY N/A 07/20/2019   Procedure: CORONARY BALLOON ANGIOPLASTY;  Surgeon: Leonie Man, MD;  Location: [facility] LAB;  Service: Cardiovascular;  Laterality: N/A;  RCA  . CORONARY BALLOON ANGIOPLASTY N/A 08/22/2019   Procedure: CORONARY BALLOON ANGIOPLASTY;  Surgeon: Leonie Man, MD;   Location: [facility];  Service: Cardiovascular;  Laterality: N/A;  . CORONARY STENT INTERVENTION  08/22/2019  . CORONARY STENT INTERVENTION N/A 08/22/2019   Procedure: CORONARY STENT INTERVENTION;  Surgeon: Leonie Man, MD;  Location: [place] LAB;  Service: Cardiovascular;  Laterality: N/A;  . FINE NEEDLE ASPIRATION  11/25/2020   Procedure: FINE NEEDLE ASPIRATION (FNA) LINEAR;  Surgeon: Collene Gobble, MD;  Location: [facility] ENDOSCOPY;  Service: Pulmonary;;  . LEFT HEART CATH AND CORONARY ANGIOGRAPHY N/A 07/20/2019   Procedure: LEFT HEART CATH AND CORONARY ANGIOGRAPHY;  Surgeon: Leonie Man, MD;  Location: [facility] LAB;  Service: Cardiovascular;  Laterality: N/A;  . LEFT HEART CATH AND CORONARY ANGIOGRAPHY N/A 08/22/2019   Procedure: LEFT HEART CATH AND CORONARY ANGIOGRAPHY;  Surgeon: Leonie Man, MD;  Location: [place] [facility];  Service: Cardiovascular;  Laterality: N/A;  . LUNG CANCER SURGERY    . ROTATOR CUFF REPAIR Left   . TEMPORARY PACEMAKER N/A 08/22/2019   Procedure: TEMPORARY PACEMAKER;  Surgeon: Leonie Man, MD;  Location: [facility];  Service: Cardiovascular;  Laterality: N/A;  . TONSILLECTOMY     age 40  . VIDEO BRONCHOSCOPY WITH ENDOBRONCHIAL NAVIGATION N/A 11/25/2020   Procedure: VIDEO BRONCHOSCOPY WITH ENDOBRONCHIAL NAVIGATION;  Surgeon: Baltazar Apo  S, MD;  Location: [facility];  Service: Pulmonary;  Laterality: N/A;  . VIDEO BRONCHOSCOPY WITH ENDOBRONCHIAL ULTRASOUND N/A 11/25/2020   Procedure: VIDEO BRONCHOSCOPY WITH ENDOBRONCHIAL ULTRASOUND;  Surgeon: Collene Gobble, MD;  Location: [facility] ENDOSCOPY;  Service: Pulmonary;  Laterality: N/A;    Current Medications: Current Meds  Medication Sig  . ALAWAY 0.025 % ophthalmic solution Place 1 drop into both eyes 2 (two) times daily as needed (allergy/irritated eyes.).  Marland Kitchen albuterol (PROVENTIL) (2.5 MG/3ML) 0.083% nebulizer solution Take 3 mLs (2.5 mg total) by nebulization every 6 (six)  hours as needed for up to 4 doses for wheezing or shortness of breath.  . allopurinol (ZYLOPRIM) 300 MG tablet Take 300 mg by mouth in the morning.  Marland Kitchen apixaban (ELIQUIS) 5 MG TABS tablet Take 5 mg by mouth 2 (two) times daily.  . Ascorbic Acid (VITAMIN C) 1000 MG tablet Take 1,000 mg by mouth in the morning.  Marland Kitchen atorvastatin (LIPITOR) 40 MG tablet Take 40 mg by mouth every evening.   . Cholecalciferol (VITAMIN D3) 50 MCG (2000 UT) TABS Take 2,000 Units by mouth daily.  Marland Kitchen dicyclomine (BENTYL) 20 MG tablet Take 1 tablet (20 mg total) by mouth every 8 (eight) hours as needed for spasms.  . nitroGLYCERIN (NITROSTAT) 0.4 MG SL tablet Place 0.4 mg under the tongue every 5 (five) minutes as needed for chest pain.     Allergies:   Patient has no known allergies.   Social History   Socioeconomic History  . Marital status: Widowed    Spouse name: Not on file  . Number of children: 0  . Years of education: Not on file  . Highest education level: Not on file  Occupational History  . Occupation: retired  Tobacco Use  . Smoking status: Former Smoker    Packs/day: 3.00    Years: 57.00    Pack years: 171.00    Types: Cigarettes    Start date: 6    Quit date: 06/13/2014    Years since quitting: 6.4  . Smokeless tobacco: Never Used  Vaping Use  . Vaping Use: Never used  Substance and Sexual Activity  . Alcohol use: Not Currently  . Drug use: Never  . Sexual activity: Not on file  Other Topics Concern  . Not on file  Social History Narrative  . Not on file   Social Determinants of Health   Financial Resource Strain: Not on file  Food Insecurity: Not on file  Transportation Needs: Not on file  Physical Activity: Not on file  Stress: Not on file  Social Connections: Not on file     Family History: The patient's family history is not on file. He was adopted.  ROS:   Please see the history of present illness.    All other systems reviewed and are negative.  EKGs/Labs/Other  Studies Reviewed:    The following studies were reviewed today: I discussed my findings with the patient at length.   Recent Labs: 06/20/2020: ALT 18 11/21/2020: BUN 24; Creatinine, Ser 1.44; Hemoglobin 15.6; Platelets 131.0; Potassium 4.0; Sodium 138  Recent Lipid Panel    Component Value Date/Time   CHOL 130 06/20/2020 0804   TRIG 92 06/20/2020 0804   HDL 35 (L) 06/20/2020 0804   CHOLHDL 3.7 06/20/2020 0804   LDLCALC 77 06/20/2020 0804    Physical Exam:    VS:  BP 132/72   Pulse (!) 56   Ht 5\' 9"  (1.753 m)   Wt 161 lb 9.6  oz (73.3 kg)   SpO2 95%   BMI 23.86 kg/m     Wt Readings from Last 3 Encounters:  12/08/20 161 lb 9.6 oz (73.3 kg)  11/25/20 160 lb (72.6 kg)  11/21/20 169 lb 9.6 oz (76.9 kg)     GEN: Patient is in no acute distress HEENT: Normal NECK: No JVD; No carotid bruits LYMPHATICS: No lymphadenopathy CARDIAC: Hear sounds regular, 2/6 systolic murmur at the apex. RESPIRATORY:  Clear to auscultation without rales, wheezing or rhonchi  ABDOMEN: Soft, non-tender, non-distended MUSCULOSKELETAL:  No edema; No deformity  SKIN: Warm and dry NEUROLOGIC:  Alert and oriented x 3 PSYCHIATRIC:  Normal affect   Signed, Jenean Lindau, MD  12/08/2020 8:43 AM    [facility]

## 2020-12-09 ENCOUNTER — Encounter (HOSPITAL_COMMUNITY): Payer: Medicare Other

## 2020-12-09 ENCOUNTER — Encounter: Payer: Medicare Other | Admitting: Vascular Surgery

## 2020-12-10 ENCOUNTER — Other Ambulatory Visit: Payer: Self-pay | Admitting: Oncology

## 2020-12-10 DIAGNOSIS — I4891 Unspecified atrial fibrillation: Secondary | ICD-10-CM | POA: Diagnosis not present

## 2020-12-10 DIAGNOSIS — C3480 Malignant neoplasm of overlapping sites of unspecified bronchus and lung: Secondary | ICD-10-CM

## 2020-12-10 DIAGNOSIS — E119 Type 2 diabetes mellitus without complications: Secondary | ICD-10-CM | POA: Diagnosis not present

## 2020-12-10 DIAGNOSIS — D649 Anemia, unspecified: Secondary | ICD-10-CM | POA: Diagnosis not present

## 2020-12-10 NOTE — Progress Notes (Signed)
Juan Valdez  [address] [place],  [place]  [address] [phone]  Clinic Day:  12/11/2020  Referring physician: Ronita Hipps, MD   HISTORY OF PRESENT ILLNESS:  The patient is a 75 y.o. male  who I was asked to consult upon for recurrent lung cancer.  This gentleman was initially diagnosed with stage I lung cancer, for which he underwent a right middle lobectomy in October 2015 in [place].  Based upon its small size, the patient never received adjuvant chemotherapy or radiation.  A recent chest CT performed per the request from his primary care office was done, which showed an enlarging mediastinal lymph node.  Mild right hilar lymphadenopathy was seen, as was nonspecific nodularity in his lower left lung base.  The patient did undergo an endobronchial ultrasound  earlier this month with biopsy of the mediastinal lymph node in question.  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  The patient comes in today to go over his biopsy and scan results, as well as their implications.  In addition to having an occasional productive cough, this gentleman has also had midline chest pain.   He also complains of having headaches and occasional dizziness.    PAST MEDICAL HISTORY:   Past Medical History:  Diagnosis Date  . Abdominal bloating 05/06/2019  . Abdominal distension 05/06/2019  . Abnormal nuclear cardiac imaging test 08/22/2019  . 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 Allergic rhinitis 05/29/2019  . Anemia   . Atrial fibrillation ([place]) 09/07/2018  . Bilateral recurrent inguinal hernia without obstruction or gangrene 05/06/2019  . CAD (coronary artery disease)    a. s/p prior DES to RCA b. cath in 07/2019 showing 75% Prox to mid RCA stenosis, 5% ISR along previously placed distal RCA stent, 85% Prox LCx stenosis, and 100% CTO of  distal LCx with filled from L--> L collaterals. Also had a long-diffuse 65-75% stenosis along proximal to mid-LAD. Unable to fully expand balloon for PCI of RCA and lesion reduced to 65%. Future atherectotmy of RCA and LCx.   Marland Kitchen CAD S/P percutaneous coronary angioplasty 07/20/2019  . CHF (congestive heart failure) ([place]) 11/27/2019  . Colon polyp   . COPD (chronic obstructive pulmonary disease) ([place])   . COPD with acute exacerbation ([place]) 06/14/2019  . Coronary artery disease involving native coronary artery of native heart with angina pectoris ([place]) 07/20/2019  . Diabetes ([place])   . Diabetes mellitus due to underlying condition with unspecified complications ([place]) 60/73/7106  . Diverticulosis   . DOE (dyspnea on exertion) 07/13/2019  . Essential hypertension 09/07/2018  . Excessive daytime sleepiness 06/09/2020  . 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 Generalized abdominal pain 05/06/2019  . GERD (gastroesophageal reflux disease)   . Gout   . HLD (hyperlipidemia)   . 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 Hx of colonic polyps 05/06/2019  . Irritable bowel syndrome with constipation 05/06/2019  . Lung cancer ([place])   . Lung nodule   . Mediastinal lymphadenopathy 11/25/2020  . Medication management 01/04/2019  . Mixed dyslipidemia 09/07/2018  . Non-small cell cancer of right lung ([place]) 11/23/2018  . Onychomycosis due to dermatophyte 11/16/2018  . Orthopnea 01/04/2019  . Perforated bowel ([place])   . Permanent  atrial fibrillation ([place]) 09/07/2018  . SBO (small bowel obstruction) ([place]) 05/08/2019  . Shortness of breath 07/08/2019    PAST SURGICAL HISTORY:   Past Surgical History:  Procedure Laterality Date  . ABDOMINAL SURGERY    . BRONCHIAL BIOPSY  11/25/2020   Procedure: BRONCHIAL BIOPSIES;  Surgeon: Collene Gobble, MD;  Location[facility]COPY;  Service:  Pulmonary;;  . BRONCHIAL BRUSHINGS  11/25/2020   Procedure: BRONCHIAL BRUSHINGS;  Surgeon: Collene Gobble, MD;  Location: [facility] ENDOSCOPY;  Service: Pulmonary;;  . BRONCHIAL WASHINGS  11/25/2020   Procedure: BRONCHIAL WASHINGS;  Surgeon: Collene Gobble, MD;  Location: [facility] ENDOSCOPY;  Service: Pulmonary;;  . CORONARY ANGIOPLASTY WITH STENT PLACEMENT  2003  . CORONARY ATHERECTOMY N/A 08/22/2019   Procedure: CORONARY ATHERECTOMY;  Surgeon: Leonie Man, MD;  Location: [place] [facility] CV LAB;  Service: Cardiovascular;  Laterality: N/A;  . CORONARY BALLOON ANGIOPLASTY N/A 07/20/2019   Procedure: CORONARY BALLOON ANGIOPLASTY;  Surgeon: Leonie Man, MD;  Location: [place] CV LAB;  Service: Cardiovascular;  Laterality: N/A;  RCA  . CORONARY BALLOON ANGIOPLASTY N/A 08/22/2019   Procedure: CORONARY BALLOON ANGIOPLASTY;  Surgeon: Leonie Man, MD;  Location: [place] CV LAB;  Service: Cardiovascular;  Laterality: N/A;  . CORONARY STENT INTERVENTION  08/22/2019  . CORONARY STENT INTERVENTION N/A 08/22/2019   Procedure: CORONARY STENT INTERVENTION;  Surgeon: Leonie Man, MD;  Location: [place] CV LAB;  Service: Cardiovascular;  Laterality: N/A;  . FINE NEEDLE ASPIRATION  11/25/2020   Procedure: FINE NEEDLE ASPIRATION (FNA) LINEAR;  Surgeon: Collene Gobble, MD;  Location: [facility] ENDOSCOPY;  Service: Pulmonary;;  . LEFT HEART CATH AND CORONARY ANGIOGRAPHY N/A 07/20/2019   Procedure: LEFT HEART CATH AND CORONARY ANGIOGRAPHY;  Surgeon: Leonie Man, MD;  Location: [facility] CV LAB;  Service: Cardiovascular;  Laterality: N/A;  . LEFT HEART CATH AND CORONARY ANGIOGRAPHY N/A 08/22/2019   Procedure: LEFT HEART CATH AND CORONARY ANGIOGRAPHY;  Surgeon: Leonie Man, MD;  Location: [facility] CV LAB;  Service: Cardiovascular;  Laterality: N/A;  . LUNG CANCER SURGERY    . ROTATOR CUFF REPAIR Left   . TEMPORARY PACEMAKER N/A 08/22/2019   Procedure: TEMPORARY PACEMAKER;  Surgeon: Leonie Man,  MD;  Location: [facility] CV LAB;  Service: Cardiovascular;  Laterality: N/A;  . TONSILLECTOMY     age 63  . VIDEO BRONCHOSCOPY WITH ENDOBRONCHIAL NAVIGATION N/A 11/25/2020   Procedure: VIDEO BRONCHOSCOPY WITH ENDOBRONCHIAL NAVIGATION;  Surgeon: Collene Gobble, MD;  Location: [facility] ENDOSCOPY;  Service: Pulmonary;  Laterality: N/A;  . VIDEO BRONCHOSCOPY WITH ENDOBRONCHIAL ULTRASOUND N/A 11/25/2020   Procedure: VIDEO BRONCHOSCOPY WITH ENDOBRONCHIAL ULTRASOUND;  Surgeon: Collene Gobble, MD;  Location: [facility] ENDOSCOPY;  Service: Pulmonary;  Laterality: N/A;    CURRENT MEDICATIONS:   Current Outpatient Medications  Medication Sig Dispense Refill  . ALAWAY 0.025 % ophthalmic solution Place 1 drop into both eyes 2 (two) times daily as needed (allergy/irritated eyes.).    Marland Kitchen albuterol (PROVENTIL) (2.5 MG/3ML) 0.083% nebulizer solution Take 3 mLs (2.5 mg total) by nebulization every 6 (six) hours as needed for up to 4 doses for wheezing or shortness of breath. 1080 mL 0  . allopurinol (ZYLOPRIM) 300 MG tablet Take 300 mg by mouth in the morning.    Marland Kitchen apixaban (ELIQUIS) 5 MG TABS tablet Take 5 mg by mouth 2 (two) times daily.    . Ascorbic Acid (VITAMIN C) 1000 MG tablet Take 1,000 mg by mouth in the  morning.    Marland Kitchen atorvastatin (LIPITOR) 40 MG tablet Take 40 mg by mouth every evening.     . Cholecalciferol (VITAMIN D3) 50 MCG (2000 UT) TABS Take 2,000 Units by mouth daily.    Marland Kitchen dicyclomine (BENTYL) 20 MG tablet Take 1 tablet (20 mg total) by mouth every 8 (eight) hours as needed for spasms. 120 tablet 0  . dronabinol (MARINOL) 5 MG capsule Take 1 capsule (5 mg total) by mouth 2 (two) times daily before a meal. 60 capsule 0  . Fluticasone-Umeclidin-Vilant (TRELEGY ELLIPTA) 100-62.5-25 MCG/INH AEPB Inhale 1 puff into the lungs daily. 28 each 0  . GAS RELIEF ULTRA STRENGTH 180 MG CAPS Take 180 mg by mouth in the morning and at bedtime. After breakfast & before bedtime    . glipiZIDE (GLUCOTROL XL) 2.5 MG 24 hr  tablet Take 2.5 mg by mouth daily after breakfast.    . HOMEOPATHIC PRODUCTS EX Apply 1 application topically 2 (two) times daily as needed (diabetic pain.). Topricin Foot Pain Relief Cream    . HYDROcodone-acetaminophen (NORCO) 10-325 MG tablet Take 1 tablet by mouth every 6 (six) hours as needed. 60 tablet 0  . ipratropium (ATROVENT) 0.03 % nasal spray Place 2 sprays into both nostrils every 12 (twelve) hours as needed for rhinitis. 30 mL 6  . LORazepam (ATIVAN) 1 MG tablet Take 1 tablet (1 mg total) by mouth every 8 (eight) hours as needed for anxiety. 30 tablet 0  . metolazone (ZAROXOLYN) 2.5 MG tablet TAKE ONE TABLET BY MOUTH every morning 90 tablet 3  . nitroGLYCERIN (NITROSTAT) 0.4 MG SL tablet Place 0.4 mg under the tongue every 5 (five) minutes as needed for chest pain.    . Omega-3 Fatty Acids (FISH OIL) 1200 MG CAPS Take 1,200 mg by mouth every evening.    Marland Kitchen omeprazole (PRILOSEC) 20 MG capsule Take 20 mg by mouth 2 (two) times daily.    . ondansetron (ZOFRAN) 4 MG tablet Take 1 tablet (4 mg total) by mouth every 4 (four) hours as needed for nausea. 90 tablet 3  . potassium gluconate 595 (99 K) MG TABS tablet Take 595 mg by mouth every evening.    Marland Kitchen PROAIR HFA 108 (90 Base) MCG/ACT inhaler Inhale 2 puffs into the lungs every 2 (two) hours as needed for shortness of breath or wheezing.    . prochlorperazine (COMPAZINE) 10 MG tablet Take 1 tablet (10 mg total) by mouth every 6 (six) hours as needed for nausea or vomiting. 90 tablet 3  . ranolazine (RANEXA) 1000 MG SR tablet Take 1 tablet (1,000 mg total) by mouth 2 (two) times daily. 180 tablet 3  . RESTASIS 0.05 % ophthalmic emulsion Place 1 drop into both eyes 2 (two) times daily as needed (dry eyes).    . tamsulosin (FLOMAX) 0.4 MG CAPS capsule Take 0.4 mg by mouth in the morning and at bedtime.    . traZODone (DESYREL) 150 MG tablet Take 150 mg by mouth at bedtime.     . triamcinolone cream (KENALOG) 0.1 % Apply 1 application topically  daily as needed (rash/skin irritation).     No current facility-administered medications for this visit.    ALLERGIES:  No Known Allergies  FAMILY HISTORY:   Family History  Adopted: Yes    SOCIAL HISTORY:  The patient was born and raised in [place], [place].  He lives in [place] with his fiancee.  He has no children.  He was a soft drink and Estate manager/land agent  for 30 years.  He did smoke 2 packs cigarettes daily for 50 years before quitting 6+ years ago.  There is a history of remote alcohol use.  REVIEW OF SYSTEMS:  Review of Systems  Constitutional: Positive for fatigue. Negative for fever and unexpected weight change.  Respiratory: Positive for cough and shortness of breath. Negative for chest tightness and hemoptysis.   Cardiovascular: Negative for chest pain and palpitations.  Gastrointestinal: Positive for abdominal pain and diarrhea. Negative for abdominal distention, blood in stool, constipation, nausea and vomiting.  Genitourinary: Negative for dysuria, frequency and hematuria.   Musculoskeletal: Negative for arthralgias, back pain and myalgias.  Skin: Negative for itching and rash.  Neurological: Positive for dizziness and headaches. Negative for light-headedness.  Psychiatric/Behavioral: Negative for depression and suicidal ideas. The patient is not nervous/anxious.      PHYSICAL EXAM:  Blood pressure 140/75, pulse (!) 59, temperature (!) 97.2 F (36.2 C), resp. rate 18, height 5\' 9"  (1.753 m), weight 163 lb 9.6 oz (74.2 kg), SpO2 94 %. Wt Readings from Last 3 Encounters:  12/11/20 163 lb 9.6 oz (74.2 kg)  12/08/20 161 lb 9.6 oz (73.3 kg)  11/25/20 160 lb (72.6 kg)   Body mass index is 24.16 kg/m. Performance status (ECOG): 1 Physical Exam Constitutional:      Appearance: Normal appearance. He is not ill-appearing.  HENT:     Mouth/Throat:     Mouth: Mucous membranes are moist.     Pharynx: Oropharynx is clear. No oropharyngeal exudate or  posterior oropharyngeal erythema.  Cardiovascular:     Rate and Rhythm: Normal rate and regular rhythm.     Heart sounds: No murmur heard. No friction rub. No gallop.   Pulmonary:     Effort: Pulmonary effort is normal. No respiratory distress.     Breath sounds: Normal breath sounds. No wheezing, rhonchi or rales.  Chest:  Breasts:     Right: No axillary adenopathy or supraclavicular adenopathy.     Left: No axillary adenopathy or supraclavicular adenopathy.    Abdominal:     General: Bowel sounds are normal. There is no distension.     Palpations: Abdomen is soft. There is no mass.     Tenderness: There is no abdominal tenderness.  Musculoskeletal:        General: No swelling.     Right lower leg: No edema.     Left lower leg: No edema.  Lymphadenopathy:     Cervical: No cervical adenopathy.     Upper Body:     Right upper body: No supraclavicular or axillary adenopathy.     Left upper body: No supraclavicular or axillary adenopathy.     Lower Body: No right inguinal adenopathy. No left inguinal adenopathy.  Skin:    General: Skin is warm.     Coloration: Skin is not jaundiced.     Findings: No lesion or rash.  Neurological:     General: No focal deficit present.     Mental Status: He is alert and oriented to person, place, and time. Mental status is at baseline.     Cranial Nerves: Cranial nerves are intact.  Psychiatric:        Mood and Affect: Mood normal.        Behavior: Behavior normal.        Thought Content: Thought content normal.     LABS:   CBC Latest Ref Rng & Units 12/11/2020 11/21/2020 08/13/2020  WBC - 9.1 9.9 9.1  Hemoglobin 13.5 -  17.5 15.7 15.6 16.4  Hematocrit 41 - 53 46 45.5 48  Platelets 150 - 399 140(A) 131.0(L) 128(A)   CMP Latest Ref Rng & Units 12/11/2020 11/21/2020 10/21/2020  Glucose 70 - 99 mg/dL - 131(H) 119(H)  BUN 4 - 21 37(A) 24(H) 22  Creatinine 0.6 - 1.3 1.2 1.44 1.52(H)  Sodium 137 - 147 136(A) 138 140  Potassium 3.4 - 5.3 3.9 4.0  4.1  Chloride 99 - 108 93(A) 97 96  CO2 13 - 22 36(A) 34(H) 29  Calcium 8.7 - 10.7 9.3 9.8 10.2  Total Protein 6.0 - 8.5 g/dL - - -  Total Bilirubin 0.0 - 1.2 mg/dL - - -  Alkaline Phos 25 - 125 111 - -  AST 14 - 40 20 - -  ALT 10 - 40 15 - -     ASSESSMENT & PLAN:  A 75 y.o. male who I was asked to consult upon for what appears to be mediastinal lymph node recurrence of his previous non-small cell lung cancer.  Although the pathology appears to be mostly consistent with adenocarcinoma, this has not been definitively proven.  Pathology from his right middle lobectomy has not yet been sent to [place].  Ultimately, this gentleman needs to be restaged to ensure other areas of disease have not developed over time.  As he does have worsening headaches, a brain MRI will be done to rule out brain mets.  I will also have him undergo a PET scan to ensure there are no sites of occult disease metastasis.  I will see him back to go over his scans and their implications.  The patient understands all the plans discussed today and is in agreement with them.  I do appreciate Ronita Hipps, MD for his new consult.   Login Muckleroy Macarthur Critchley, MD

## 2020-12-10 NOTE — Progress Notes (Unsigned)
[facility]  [address] [place],  [place]  [address] [phone]  Clinic Day:  12/10/2020  Referring physician: No ref. provider found   HISTORY OF PRESENT ILLNESS:  The patient is a 75 y.o. 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  This patient underwent a right middle lobectomy in 2015, which revealed.  Due to a recent chest CT showing an enlarging precarinal lymph nodes, and EBUS with biopsy was performed 2 weeks ago.  The pathology from this biopsy showed malignant cells, but not enough were collected to determine the exact subtype he has.  PAST MEDICAL HISTORY:   Past Medical History:  Diagnosis Date  . Abdominal bloating 05/06/2019  . Abdominal distension 05/06/2019  . Abnormal nuclear cardiac imaging test 08/22/2019  . 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 Allergic rhinitis 05/29/2019  . Anemia   . Atrial fibrillation ([name]) 09/07/2018  . Bilateral recurrent inguinal hernia without obstruction or gangrene 05/06/2019  . CAD (coronary artery disease)    a. s/p prior DES to RCA b. cath in 07/2019 showing 75% Prox to mid RCA stenosis, 5% ISR along previously placed distal RCA stent, 85% Prox LCx stenosis, and 100% CTO of distal LCx with filled from L--> L collaterals. Also had a long-diffuse 65-75% stenosis along proximal to mid-LAD. Unable to fully expand balloon for PCI of RCA and lesion reduced to 65%. Future atherectotmy of RCA and LCx.   Marland Kitchen CAD S/P percutaneous coronary angioplasty 07/20/2019  . CHF (congestive heart failure) ([place]) 11/27/2019  . Colon polyp   . COPD (chronic obstructive pulmonary disease) ([place])   . COPD with acute exacerbation ([place]) 06/14/2019  . Coronary artery disease involving native coronary artery of native heart with angina pectoris ([place]) 07/20/2019  . Diabetes ([place])   . Diabetes mellitus due to underlying condition with unspecified complications ([place])  91/47/8295  . Diverticulosis   . DOE (dyspnea on exertion) 07/13/2019  . Essential hypertension 09/07/2018  . Excessive daytime sleepiness 06/09/2020  . 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 Generalized abdominal pain 05/06/2019  . GERD (gastroesophageal reflux disease)   . Gout   . HLD (hyperlipidemia)   . 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 Hx of colonic polyps 05/06/2019  . Irritable bowel syndrome with constipation 05/06/2019  . Lung cancer ([place])   . Lung nodule   . Mediastinal lymphadenopathy 11/25/2020  . Medication management 01/04/2019  . Mixed dyslipidemia 09/07/2018  . Non-small cell cancer of right lung ([place]) 11/23/2018  . Onychomycosis due to dermatophyte 11/16/2018  . Orthopnea 01/04/2019  . Perforated bowel ([place])   . Permanent atrial fibrillation ([place]) 09/07/2018  . SBO (small bowel obstruction) ([place]) 05/08/2019  . Shortness of breath 07/08/2019    PAST SURGICAL HISTORY:   Past Surgical History:  Procedure Laterality Date  . ABDOMINAL SURGERY    . BRONCHIAL BIOPSY  11/25/2020   Procedure: BRONCHIAL BIOPSIES;  Surgeon: Collene Gobble, MD;  Location: [facility] ENDOSCOPY;  Service: Pulmonary;;  . BRONCHIAL BRUSHINGS  11/25/2020   Procedure: BRONCHIAL BRUSHINGS;  Surgeon: Collene Gobble, MD;  Location: [facility] ENDOSCOPY;  Service: Pulmonary;;  . BRONCHIAL WASHINGS  11/25/2020   Procedure: BRONCHIAL WASHINGS;  Surgeon: Collene Gobble, MD;  Location: [facility] ENDOSCOPY;  Service: Pulmonary;;  .  CORONARY ANGIOPLASTY WITH STENT PLACEMENT  2003  . CORONARY ATHERECTOMY N/A 08/22/2019   Procedure: CORONARY ATHERECTOMY;  Surgeon: Leonie Man, MD;  Location: [facility] CV LAB;  Service: Cardiovascular;  Laterality: N/A;  . CORONARY BALLOON ANGIOPLASTY N/A 07/20/2019   Procedure: CORONARY BALLOON ANGIOPLASTY;  Surgeon: Leonie Man, MD;  Location:  [place] CV LAB;  Service: Cardiovascular;  Laterality: N/A;  RCA  . CORONARY BALLOON ANGIOPLASTY N/A 08/22/2019   Procedure: CORONARY BALLOON ANGIOPLASTY;  Surgeon: Leonie Man, MD;  Location: [place] CV LAB;  Service: Cardiovascular;  Laterality: N/A;  . CORONARY STENT INTERVENTION  08/22/2019  . CORONARY STENT INTERVENTION N/A 08/22/2019   Procedure: CORONARY STENT INTERVENTION;  Surgeon: Leonie Man, MD;  Location: [place] CV LAB;  Service: Cardiovascular;  Laterality: N/A;  . FINE NEEDLE ASPIRATION  11/25/2020   Procedure: FINE NEEDLE ASPIRATION (FNA) LINEAR;  Surgeon: Collene Gobble, MD;  Location: [facility] ENDOSCOPY;  Service: Pulmonary;;  . LEFT HEART CATH AND CORONARY ANGIOGRAPHY N/A 07/20/2019   Procedure: LEFT HEART CATH AND CORONARY ANGIOGRAPHY;  Surgeon: Leonie Man, MD;  Location: [place] CV LAB;  Service: Cardiovascular;  Laterality: N/A;  . LEFT HEART CATH AND CORONARY ANGIOGRAPHY N/A 08/22/2019   Procedure: LEFT HEART CATH AND CORONARY ANGIOGRAPHY;  Surgeon: Leonie Man, MD;  Location: [place] CV LAB;  Service: Cardiovascular;  Laterality: N/A;  . LUNG CANCER SURGERY    . ROTATOR CUFF REPAIR Left   . TEMPORARY PACEMAKER N/A 08/22/2019   Procedure: TEMPORARY PACEMAKER;  Surgeon: Leonie Man, MD;  Location: [place] CV LAB;  Service: Cardiovascular;  Laterality: N/A;  . TONSILLECTOMY     age 61  . VIDEO BRONCHOSCOPY WITH ENDOBRONCHIAL NAVIGATION N/A 11/25/2020   Procedure: VIDEO BRONCHOSCOPY WITH ENDOBRONCHIAL NAVIGATION;  Surgeon: Collene Gobble, MD;  Location: [facility] ENDOSCOPY;  Service: Pulmonary;  Laterality: N/A;  . VIDEO BRONCHOSCOPY WITH ENDOBRONCHIAL ULTRASOUND N/A 11/25/2020   Procedure: VIDEO BRONCHOSCOPY WITH ENDOBRONCHIAL ULTRASOUND;  Surgeon: Collene Gobble, MD;  Location: [facility] ENDOSCOPY;  Service: Pulmonary;  Laterality: N/A;    CURRENT MEDICATIONS:   Current Outpatient Medications  Medication Sig Dispense Refill  . ALAWAY 0.025 %  ophthalmic solution Place 1 drop into both eyes 2 (two) times daily as needed (allergy/irritated eyes.).    Marland Kitchen albuterol (PROVENTIL) (2.5 MG/3ML) 0.083% nebulizer solution Take 3 mLs (2.5 mg total) by nebulization every 6 (six) hours as needed for up to 4 doses for wheezing or shortness of breath. 1080 mL 0  . allopurinol (ZYLOPRIM) 300 MG tablet Take 300 mg by mouth in the morning.    Marland Kitchen apixaban (ELIQUIS) 5 MG TABS tablet Take 5 mg by mouth 2 (two) times daily.    . Ascorbic Acid (VITAMIN C) 1000 MG tablet Take 1,000 mg by mouth in the morning.    Marland Kitchen atorvastatin (LIPITOR) 40 MG tablet Take 40 mg by mouth every evening.     . Cholecalciferol (VITAMIN D3) 50 MCG (2000 UT) TABS Take 2,000 Units by mouth daily.    Marland Kitchen dicyclomine (BENTYL) 20 MG tablet Take 1 tablet (20 mg total) by mouth every 8 (eight) hours as needed for spasms. 120 tablet 0  . Fluticasone-Umeclidin-Vilant (TRELEGY ELLIPTA) 100-62.5-25 MCG/INH AEPB Inhale 1 puff into the lungs daily. 28 each 0  . GAS RELIEF ULTRA STRENGTH 180 MG CAPS Take 180 mg by mouth in the morning and at bedtime. After breakfast & before bedtime    . glipiZIDE (GLUCOTROL  XL) 2.5 MG 24 hr tablet Take 2.5 mg by mouth daily after breakfast.    . HOMEOPATHIC PRODUCTS EX Apply 1 application topically 2 (two) times daily as needed (diabetic pain.). Topricin Foot Pain Relief Cream    . ipratropium (ATROVENT) 0.03 % nasal spray Place 2 sprays into both nostrils every 12 (twelve) hours as needed for rhinitis. 30 mL 6  . metolazone (ZAROXOLYN) 2.5 MG tablet TAKE ONE TABLET BY MOUTH every morning 90 tablet 3  . nitroGLYCERIN (NITROSTAT) 0.4 MG SL tablet Place 0.4 mg under the tongue every 5 (five) minutes as needed for chest pain.    . Omega-3 Fatty Acids (FISH OIL) 1200 MG CAPS Take 1,200 mg by mouth every evening.    Marland Kitchen omeprazole (PRILOSEC) 20 MG capsule Take 20 mg by mouth 2 (two) times daily.    . potassium gluconate 595 (99 K) MG TABS tablet Take 595 mg by mouth every  evening.    Marland Kitchen PROAIR HFA 108 (90 Base) MCG/ACT inhaler Inhale 2 puffs into the lungs every 2 (two) hours as needed for shortness of breath or wheezing.    . ranolazine (RANEXA) 1000 MG SR tablet Take 1 tablet (1,000 mg total) by mouth 2 (two) times daily. 180 tablet 3  . RESTASIS 0.05 % ophthalmic emulsion Place 1 drop into both eyes 2 (two) times daily as needed (dry eyes).    . tamsulosin (FLOMAX) 0.4 MG CAPS capsule Take 0.4 mg by mouth in the morning and at bedtime.    . traMADol (ULTRAM) 50 MG tablet Take 50 mg by mouth every 6 (six) hours as needed for pain.    . traZODone (DESYREL) 150 MG tablet Take 150 mg by mouth at bedtime.     . triamcinolone cream (KENALOG) 0.1 % Apply 1 application topically daily as needed (rash/skin irritation).     No current facility-administered medications for this visit.    ALLERGIES:  No Known Allergies  FAMILY HISTORY:   Family History  Adopted: Yes    SOCIAL HISTORY:   reports that he quit smoking about 6 years ago. His smoking use included cigarettes. He started smoking about 64 years ago. He has a 171.00 pack-year smoking history. He has never used smokeless tobacco. He reports previous alcohol use. He reports that he does not use drugs.  REVIEW OF SYSTEMS:  Review of Systems - Oncology   PHYSICAL EXAM:  There were no vitals taken for this visit. Wt Readings from Last 3 Encounters:  12/08/20 161 lb 9.6 oz (73.3 kg)  11/25/20 160 lb (72.6 kg)  11/21/20 169 lb 9.6 oz (76.9 kg)   There is no height or weight on file to calculate BMI. Performance status (ECOG): {CHL ONC [id number] Physical Exam .phy  LABS:   CBC Latest Ref Rng & Units 11/21/2020 08/13/2020 11/14/2019  WBC 4.0 - 10.5 K/uL 9.9 9.1 7.3  Hemoglobin 13.0 - 17.0 g/dL 15.6 16.4 16.4  Hematocrit 39.0 - 52.0 % 45.5 48 48.0  Platelets 150.0 - 400.0 K/uL 131.0(L) 128(A) 119.0(L)   CMP Latest Ref Rng & Units 11/21/2020 10/21/2020 06/20/2020  Glucose 70 - 99 mg/dL 131(H) 119(H)  131(H)  BUN 6 - 23 mg/dL 24(H) 22 26  Creatinine 0.40 - 1.50 mg/dL 1.44 1.52(H) 1.35(H)  Sodium 135 - 145 mEq/L 138 140 139  Potassium 3.5 - 5.1 mEq/L 4.0 4.1 4.4  Chloride 96 - 112 mEq/L 97 96 98  CO2 19 - 32 mEq/L 34(H) 29 29  Calcium 8.4 - 10.5  mg/dL 9.8 10.2 9.4  Total Protein 6.0 - 8.5 g/dL - - 6.6  Total Bilirubin 0.0 - 1.2 mg/dL - - 1.0  Alkaline Phos 44 - 121 IU/L - - 133(H)  AST 0 - 40 IU/L - - 21  ALT 0 - 44 IU/L - - 18     No results found for: CEA1 / No results found for: CEA1 No results found for: PSA1 No results found for: QZR007 No results found for: CAN125  No results found for: Ronnald Ramp, A1GS, A2GS, BETS, BETA2SER, GAMS, MSPIKE, SPEI Lab Results  Component Value Date   TIBC 310 12/01/2020   TIBC 295 08/13/2020   FERRITIN 197 12/01/2020   FERRITIN 248 08/13/2020   IRONPCTSAT 26 12/01/2020   IRONPCTSAT 32 08/13/2020   No results found for: LDH  No results found for: AFPTUMOR, TOTALPROTELP, ALBUMINELP, A1GS, A2GS, BETS, BETA2SER, GAMS, MSPIKE, SPEI, LDH, CEA1, PSA1, IGASERUM, IGGSERUM, IGMSERUM, THGAB, THYROGLB  Recent Review Flowsheet Data    Oncology Labs Latest Ref Rng & Units 08/13/2020 12/01/2020   FERRITIN 24 - 336 ng/mL 248 197   IRONPCTSAT 17.9 - 39.5 % 32 26      STUDIES:  DG Chest Port 1 View  Result Date: 11/25/2020 CLINICAL DATA:  Bronchoscopy. History of non-small cell lung cancer and right middle lobectomy EXAM: PORTABLE CHEST 1 VIEW COMPARISON:  11/18/2020 FINDINGS: Slightly limited exam secondary to patient rotation which accentuates the mediastinal width. Chronic elevation of the left hemidiaphragm. Streaky left basilar opacity favors atelectasis. Subtle right perihilar interstitial prominence. No appreciable pleural fluid collection. No pneumothorax. IMPRESSION: Slightly limited exam secondary to patient rotation. No evidence of pneumothorax. Electronically Signed   By: Davina Poke D.O.   On: 11/25/2020 14:41   DG C-ARM  BRONCHOSCOPY  Result Date: 11/25/2020 C-ARM BRONCHOSCOPY: Fluoroscopy was utilized by the requesting physician.  No radiographic interpretation.     ASSESSMENT & PLAN:  A 75 y.o. male who I was asked to consult upon for *** .The patient understands all the plans discussed today and is in agreement with them.  I do appreciate No ref. provider found for his new consult.   Lucifer Soja Macarthur Critchley, MD

## 2020-12-11 ENCOUNTER — Other Ambulatory Visit: Payer: Self-pay | Admitting: Hematology and Oncology

## 2020-12-11 ENCOUNTER — Other Ambulatory Visit: Payer: Self-pay | Admitting: Oncology

## 2020-12-11 ENCOUNTER — Other Ambulatory Visit: Payer: Self-pay

## 2020-12-11 ENCOUNTER — Inpatient Hospital Stay: Payer: Medicare Other

## 2020-12-11 ENCOUNTER — Inpatient Hospital Stay (INDEPENDENT_AMBULATORY_CARE_PROVIDER_SITE_OTHER): Payer: Medicare Other | Admitting: Oncology

## 2020-12-11 VITALS — BP 140/75 | HR 59 | Temp 97.2°F | Resp 18 | Ht 69.0 in | Wt 163.6 lb

## 2020-12-11 DIAGNOSIS — R42 Dizziness and giddiness: Secondary | ICD-10-CM

## 2020-12-11 DIAGNOSIS — D649 Anemia, unspecified: Secondary | ICD-10-CM | POA: Diagnosis not present

## 2020-12-11 DIAGNOSIS — Z85118 Personal history of other malignant neoplasm of bronchus and lung: Secondary | ICD-10-CM | POA: Diagnosis not present

## 2020-12-11 DIAGNOSIS — C3491 Malignant neoplasm of unspecified part of right bronchus or lung: Secondary | ICD-10-CM

## 2020-12-11 DIAGNOSIS — C3490 Malignant neoplasm of unspecified part of unspecified bronchus or lung: Secondary | ICD-10-CM | POA: Diagnosis not present

## 2020-12-11 DIAGNOSIS — R591 Generalized enlarged lymph nodes: Secondary | ICD-10-CM

## 2020-12-11 DIAGNOSIS — C3480 Malignant neoplasm of overlapping sites of unspecified bronchus and lung: Secondary | ICD-10-CM

## 2020-12-11 DIAGNOSIS — R519 Headache, unspecified: Secondary | ICD-10-CM | POA: Diagnosis not present

## 2020-12-11 DIAGNOSIS — Z87891 Personal history of nicotine dependence: Secondary | ICD-10-CM | POA: Diagnosis not present

## 2020-12-11 LAB — CBC AND DIFFERENTIAL
HCT: 46 (ref 41–53)
Hemoglobin: 15.7 (ref 13.5–17.5)
Neutrophils Absolute: 7.55
Platelets: 140 — AB (ref 150–399)
WBC: 9.1

## 2020-12-11 LAB — COMPREHENSIVE METABOLIC PANEL
Albumin: 4.1 (ref 3.5–5.0)
Calcium: 9.3 (ref 8.7–10.7)

## 2020-12-11 LAB — BASIC METABOLIC PANEL
BUN: 37 — AB (ref 4–21)
CO2: 36 — AB (ref 13–22)
Chloride: 93 — AB (ref 99–108)
Creatinine: 1.2 (ref 0.6–1.3)
Glucose: 120
Potassium: 3.9 (ref 3.4–5.3)
Sodium: 136 — AB (ref 137–147)

## 2020-12-11 LAB — CBC: RBC: 4.9 (ref 3.87–5.11)

## 2020-12-11 LAB — HEPATIC FUNCTION PANEL
ALT: 15 (ref 10–40)
AST: 20 (ref 14–40)
Alkaline Phosphatase: 111 (ref 25–125)
Bilirubin, Total: 1.5

## 2020-12-12 ENCOUNTER — Telehealth: Payer: Self-pay | Admitting: Emergency Medicine

## 2020-12-12 ENCOUNTER — Other Ambulatory Visit: Payer: Self-pay | Admitting: Oncology

## 2020-12-12 ENCOUNTER — Encounter: Payer: Medicare Other | Admitting: Gastroenterology

## 2020-12-12 MED ORDER — HYDROCODONE-ACETAMINOPHEN 10-300 MG PO TABS: 1.0000 | ORAL_TABLET | Freq: Four times a day (QID) | ORAL | 0 refills | Status: DC | PRN

## 2020-12-12 NOTE — Telephone Encounter (Signed)
I agree that he is dealing with a lot. I think his chest pain is coming from the cancer itself, and we need to concentrate on getting it treated. I'm ok postponing the PSG - lets try to reschedule for a month or 2 from now. Thanks.

## 2020-12-12 NOTE — Telephone Encounter (Signed)
Called and spoke with patient. He verbalized understanding of RB's recommendations. He stated that his sleep study is scheduled at [facility] on 12/15/20.   [facility] closed today at 1230pm. Will need to call first thing Monday AM to cancel. Number is [phone]

## 2020-12-12 NOTE — Telephone Encounter (Signed)
pt is scheduled to have sleep study done on 12/15/20 , pt is wondering if he should do it at this time due to having trouble sleeping.. mentions pt is in pain from recently procedure, unsure if the sleep test would be as accurate.  Pt is also scheduled for pet scan & MRI  for next week by endoc.Marland Kitchen--- [phone]

## 2020-12-12 NOTE — Telephone Encounter (Signed)
Called and spoke with Juan Valdez, pt's significant other (listed on DPR) who wanted to know if we might need to postpone the sleep study which is currently scheduled 12/15/20. Juan Valdez stated that pt has not been sleeping well since he had the bronch with biopsy performed by Dr. Lamonte Sakai on 3/15.  Juan Valdez stated that pt has been taking pain meds that had been prescribed but they are not really working. She said that they have called both PCP and oncology and are waiting to hear from them about med change.  Juan Valdez said that Dr. Bobby Rumpf, oncology has ordered an MRI and PET to be done to see if they might be getting some answers on why pt is having all this pain and she said that they are trying to get this done ASAP hopefully next week.  Due to all that is currently going on and also since pt is not sleeping well and they are unsure how accurate it will be, Dr. Lamonte Sakai, please advise if you think we should cancel pt's current sleep study date and reschedule it for later.

## 2020-12-15 NOTE — Telephone Encounter (Signed)
Called and spoke with April at [facility] at [phone]  to let her know that we need to cancel patients sleep study for today. She stated that it has already been done. She called the patient this morning to confirm appointment and they talked about everything going on. Nothing further needed at this time.

## 2020-12-16 ENCOUNTER — Other Ambulatory Visit: Payer: Self-pay | Admitting: Oncology

## 2020-12-16 MED ORDER — HYDROCODONE-ACETAMINOPHEN 10-325 MG PO TABS
1.0000 | ORAL_TABLET | Freq: Four times a day (QID) | ORAL | 0 refills | Status: DC | PRN
Start: 2020-12-16 — End: 2021-01-06

## 2020-12-17 ENCOUNTER — Other Ambulatory Visit: Payer: Self-pay | Admitting: Hematology and Oncology

## 2020-12-17 MED ORDER — LORAZEPAM 1 MG PO TABS: 1.0000 mg | ORAL_TABLET | Freq: Three times a day (TID) | ORAL | 0 refills | Status: DC | PRN

## 2020-12-17 MED ORDER — ONDANSETRON HCL 4 MG PO TABS: 4.0000 mg | ORAL_TABLET | ORAL | 3 refills | Status: AC | PRN

## 2020-12-17 MED ORDER — PROCHLORPERAZINE MALEATE 10 MG PO TABS: 10.0000 mg | ORAL_TABLET | Freq: Four times a day (QID) | ORAL | 3 refills | Status: AC | PRN

## 2020-12-17 MED ORDER — DRONABINOL 5 MG PO CAPS: 5.0000 mg | ORAL_CAPSULE | Freq: Two times a day (BID) | ORAL | 0 refills | Status: DC

## 2020-12-26 LAB — FUNGUS CULTURE WITH STAIN

## 2020-12-26 LAB — FUNGUS CULTURE RESULT

## 2020-12-26 LAB — FUNGAL ORGANISM REFLEX

## 2021-01-01 ENCOUNTER — Telehealth: Payer: Self-pay | Admitting: Emergency Medicine

## 2021-01-01 NOTE — Telephone Encounter (Signed)
Routing to Dr. Lamonte Sakai as an Juluis Rainier

## 2021-01-06 ENCOUNTER — Other Ambulatory Visit: Payer: Self-pay

## 2021-01-06 DIAGNOSIS — R0789 Other chest pain: Secondary | ICD-10-CM

## 2021-01-06 MED ORDER — HYDROCODONE-ACETAMINOPHEN 10-325 MG PO TABS: 1.0000 | ORAL_TABLET | Freq: Four times a day (QID) | ORAL | 0 refills | Status: DC | PRN

## 2021-01-07 ENCOUNTER — Telehealth: Payer: Self-pay | Admitting: Oncology

## 2021-01-07 DIAGNOSIS — J439 Emphysema, unspecified: Secondary | ICD-10-CM | POA: Diagnosis not present

## 2021-01-07 DIAGNOSIS — I700 Atherosclerosis of aorta: Secondary | ICD-10-CM | POA: Diagnosis not present

## 2021-01-07 DIAGNOSIS — C3490 Malignant neoplasm of unspecified part of unspecified bronchus or lung: Secondary | ICD-10-CM | POA: Diagnosis not present

## 2021-01-07 DIAGNOSIS — Z85118 Personal history of other malignant neoplasm of bronchus and lung: Secondary | ICD-10-CM | POA: Diagnosis not present

## 2021-01-07 DIAGNOSIS — I714 Abdominal aortic aneurysm, without rupture: Secondary | ICD-10-CM | POA: Diagnosis not present

## 2021-01-07 DIAGNOSIS — R590 Localized enlarged lymph nodes: Secondary | ICD-10-CM | POA: Diagnosis not present

## 2021-01-07 DIAGNOSIS — K5730 Diverticulosis of large intestine without perforation or abscess without bleeding: Secondary | ICD-10-CM | POA: Diagnosis not present

## 2021-01-07 DIAGNOSIS — I2510 Atherosclerotic heart disease of native coronary artery without angina pectoris: Secondary | ICD-10-CM | POA: Diagnosis not present

## 2021-01-07 NOTE — Telephone Encounter (Signed)
Per Dr Bobby Rumpf reschedule patient's Appt Time on 4/29 to 3:00 pm Follow Up.  Ok per patient's spouse

## 2021-01-08 ENCOUNTER — Ambulatory Visit: Payer: Medicare Other | Admitting: Emergency Medicine

## 2021-01-08 ENCOUNTER — Other Ambulatory Visit: Payer: Self-pay | Admitting: Hematology and Oncology

## 2021-01-08 DIAGNOSIS — R0789 Other chest pain: Secondary | ICD-10-CM

## 2021-01-08 NOTE — Progress Notes (Signed)
Juan Valdez  [address] [place],  [place]  [address] [phone]  Clinic Day:  12/11/2020  Referring physician: Ronita Hipps, MD   HISTORY OF PRESENT ILLNESS:  The patient is a 75 y.o. male  who was recently found to have mediastinal recurrence of previous lung adenocarcinoma.  He comes in today to go over his PET scan images to determine if he has occult evidence of disease metastasis.  Since his last visit, the patient has been doing okay.  He denies having any respiratory symptoms which concern him for overt signs of disease progression.  PHYSICAL EXAM:  Blood pressure 123/74, pulse 63, temperature 98.1 F (36.7 C), temperature source Oral, resp. rate 18, height 5\' 9"  (1.753 m), weight 149 lb 1.6 oz (67.6 kg), SpO2 96 %. Wt Readings from Last 3 Encounters:  01/09/21 149 lb 1.6 oz (67.6 kg)  12/11/20 163 lb 9.6 oz (74.2 kg)  12/08/20 161 lb 9.6 oz (73.3 kg)   Body mass index is 22.02 kg/m. Performance status (ECOG): 1 Physical Exam Constitutional:      Appearance: Normal appearance. He is not ill-appearing.  HENT:     Mouth/Throat:     Mouth: Mucous membranes are moist.     Pharynx: Oropharynx is clear. No oropharyngeal exudate or posterior oropharyngeal erythema.  Cardiovascular:     Rate and Rhythm: Normal rate and regular rhythm.     Heart sounds: No murmur heard. No friction rub. No gallop.   Pulmonary:     Effort: Pulmonary effort is normal. No respiratory distress.     Breath sounds: Normal breath sounds. No wheezing, rhonchi or rales.  Chest:  Breasts:     Right: No axillary adenopathy or supraclavicular adenopathy.     Left: No axillary adenopathy or supraclavicular adenopathy.    Abdominal:     General: Bowel sounds are normal. There is no distension.     Palpations: Abdomen is soft. There is no mass.     Tenderness: There is no abdominal tenderness.  Musculoskeletal:        General: No swelling.     Right lower leg:  No edema.     Left lower leg: No edema.  Lymphadenopathy:     Cervical: No cervical adenopathy.     Upper Body:     Right upper body: No supraclavicular or axillary adenopathy.     Left upper body: No supraclavicular or axillary adenopathy.     Lower Body: No right inguinal adenopathy. No left inguinal adenopathy.  Skin:    General: Skin is warm.     Coloration: Skin is not jaundiced.     Findings: No lesion or rash.  Neurological:     General: No focal deficit present.     Mental Status: He is alert and oriented to person, place, and time. Mental status is at baseline.     Cranial Nerves: Cranial nerves are intact.  Psychiatric:        Mood and Affect: Mood normal.        Behavior: Behavior normal.        Thought Content: Thought content normal.   SCANS:  His PET scan revealed the following: FINDINGS: Mediastinal blood pool activity: SUV max 2.7  Liver activity: SUV max NA  NECK: Hypermetabolic 1.0 cm lymph node at the right thoracic inlet between clavicle and first rib with max SUV 5.0 (series 3/image 62). Hypermetabolic separate 1.1 cm lymph node at the right thoracic inlet medially  anterior to the proximal right subclavian artery with max SUV 5.8 (series 3/image 61). No enlarged or hypermetabolic left neck nodes.  Incidental CT findings: none  CHEST:  Enlarged 1.1 cm hypermetabolic right hilar lymph node with max SUV 4.2 (series 3/image 97). Enlarged hypermetabolic centrally necrotic low left paratracheal 2.7 cm lymph node with max SUV 5.3 (series 3/image 93). Enlarged hypermetabolic 1.5 cm right paratracheal node with max SUV 6.3 (series 3/image 85). No enlarged or hypermetabolic left hilar nodes. No hypermetabolic pulmonary findings.  Incidental CT findings: Three-vessel coronary atherosclerosis. Atherosclerotic nonaneurysmal thoracic aorta. Trace pericardial effusion/thickening. Moderate centrilobular emphysema. Stable postsurgical changes in the right  hemithorax with apparent right middle lobectomy. No significant pulmonary nodules.  ABDOMEN/PELVIS: No abnormal hypermetabolic activity within the liver, pancreas, adrenal glands, or spleen. No hypermetabolic lymph nodes in the abdomen or pelvis.  Incidental CT findings: Chronic distended gallbladder, unchanged from 07/10/2020 CT abdomen study. Large periampullary duodenal diverticulum, unchanged. Atherosclerotic abdominal aorta with 3.0 cm infrarenal abdominal aortic aneurysm. Several simple bilateral renal cysts, largest 3.9 cm in the medial lower right kidney. Moderate diffuse colonic diverticulosis. Moderate to marked prostatomegaly with chronic diffuse bladder wall thickening.  SKELETON: No focal hypermetabolic activity to suggest skeletal metastasis.  Incidental CT findings: none  IMPRESSION: 1. Hypermetabolic right hilar, bilateral paratracheal and right thoracic inlet lymphadenopathy compatible with metastatic disease. 2. No additional sites of hypermetabolic metastatic disease. 3. Infrarenal 3.0 cm abdominal aortic aneurysm. Recommend follow-up ultrasound every 3 years. This recommendation follows ACR consensus guidelines: White Paper of the ACR Incidental Findings Committee II on Vascular Findings. J Am Coll Radiol 2013; 10:789-794. 4. Chronic findings include: Aortic Atherosclerosis (ICD10-I70.0) and Emphysema (ICD10-J43.9). Moderate colonic diverticulosis. Moderate to marked prostatomegaly. Three-vessel coronary atherosclerosis.  ASSESSMENT & PLAN:  A 75 y.o. male who I was asked to consult upon for what appears to be hilar/bilateral mediastinal lymph node recurrence of his previous lung adenocarcinoma.  In clinic today, I went over his PET scan images with him, for which he was pleased to see that he has no evidence of distance metastasis.  His brain MRI is still pending due to him having claustrophobia issues.  However, there in nothing per his neurologic exam which  suggests CNS metastasis is present.   Moving forward, the plan with be to give this gentleman concurrent chemoradiation, which will consist of weekly carboplatin/paclitaxel.  He was made aware of the side effects that go along with this regimen, including neuropathy, nausea, fatigue, and hair thinning.  His chemoradiation will commence within the next 2-3 weeks.  I will see him back in approximately 1 month for repeat clinical assessment.  The patient understands all the plans discussed today and is in agreement with them.  I do appreciate Ronita Hipps, MD for his new consult.   [place] Lapaglia Macarthur Critchley, MD

## 2021-01-09 ENCOUNTER — Inpatient Hospital Stay: Payer: Medicare Other | Attending: Oncology | Admitting: Oncology

## 2021-01-09 ENCOUNTER — Other Ambulatory Visit: Payer: Self-pay

## 2021-01-09 ENCOUNTER — Encounter: Payer: Self-pay | Admitting: Oncology

## 2021-01-09 ENCOUNTER — Other Ambulatory Visit: Payer: Self-pay | Admitting: Oncology

## 2021-01-09 ENCOUNTER — Ambulatory Visit: Payer: Medicare Other | Admitting: Oncology

## 2021-01-09 VITALS — BP 123/74 | HR 63 | Temp 98.1°F | Resp 18 | Ht 69.0 in | Wt 149.1 lb

## 2021-01-09 DIAGNOSIS — C3491 Malignant neoplasm of unspecified part of right bronchus or lung: Secondary | ICD-10-CM | POA: Diagnosis not present

## 2021-01-09 DIAGNOSIS — C3480 Malignant neoplasm of overlapping sites of unspecified bronchus and lung: Secondary | ICD-10-CM

## 2021-01-09 LAB — ACID FAST CULTURE WITH REFLEXED SENSITIVITIES (MYCOBACTERIA): Acid Fast Culture: NEGATIVE

## 2021-01-10 DIAGNOSIS — E119 Type 2 diabetes mellitus without complications: Secondary | ICD-10-CM | POA: Diagnosis not present

## 2021-01-10 DIAGNOSIS — I4891 Unspecified atrial fibrillation: Secondary | ICD-10-CM | POA: Diagnosis not present

## 2021-01-10 DIAGNOSIS — D649 Anemia, unspecified: Secondary | ICD-10-CM | POA: Diagnosis not present

## 2021-01-12 DIAGNOSIS — C3481 Malignant neoplasm of overlapping sites of right bronchus and lung: Secondary | ICD-10-CM | POA: Diagnosis not present

## 2021-01-12 DIAGNOSIS — C383 Malignant neoplasm of mediastinum, part unspecified: Secondary | ICD-10-CM | POA: Diagnosis not present

## 2021-01-12 DIAGNOSIS — C771 Secondary and unspecified malignant neoplasm of intrathoracic lymph nodes: Secondary | ICD-10-CM | POA: Diagnosis not present

## 2021-01-13 DIAGNOSIS — C771 Secondary and unspecified malignant neoplasm of intrathoracic lymph nodes: Secondary | ICD-10-CM | POA: Insufficient documentation

## 2021-01-13 DIAGNOSIS — C3491 Malignant neoplasm of unspecified part of right bronchus or lung: Secondary | ICD-10-CM | POA: Diagnosis not present

## 2021-01-14 ENCOUNTER — Telehealth: Payer: Self-pay

## 2021-01-14 NOTE — Telephone Encounter (Signed)
   Name: Juan Valdez  DOB: 1945-10-08  MRN: [id number]   Primary Cardiologist: Jenean Lindau, MD  Chart reviewed as part of pre-operative protocol coverage. Patient was contacted 01/14/2021 in reference to pre-operative risk assessment for pending surgery as outlined below.  Juan Valdez was last seen on 12/08/20 by Dr. Geraldo Pitter.  Since that day, Juan Valdez has done well.  Prior to his cancer diagnosis, he was able to complete more than 4.0 METS. Following cancer diagnosis, he has lost significant weight and has severe pain. He can complete at least 3.97 METS, possibly more since he can walk up an incline. We discussed his risk factors for cardiac complications and he accepts this risk and wishes to proceed with surgery.   Therefore, based on ACC/AHA guidelines, the patient would be at acceptable risk for the planned procedure without further cardiovascular testing.   Per our clinical pharmacist: Patient with diagnosis of afib on Eliquis for anticoagulation.    Procedure: port a cath insertion Date of procedure: 01/16/21  CHA2DS2-VASc Score = 6  This indicates a 9.7% annual risk of stroke. The patient's score is based upon: CHF History: Yes HTN History: Yes Diabetes History: Yes Stroke History: No Vascular Disease History: Yes Age Score: 2 Gender Score: 0     CrCl 50 ml/min Platelet count 131  Per office protocol, patient can hold Eliquis for 1 day prior to procedure  The patient was advised that if he develops new symptoms prior to surgery to contact our office to arrange for a follow-up visit, and he verbalized understanding.  I will route this recommendation to the requesting party via Epic fax function and remove from pre-op pool. Please call with questions.  [place], PA 01/14/2021, 12:43 PM

## 2021-01-14 NOTE — Telephone Encounter (Signed)
   [facility] Pre-operative Risk Assessment    Patient Name: Juan Valdez  DOB: [birth year]  MRN: [id number]   HEARTCARE STAFF: - Please ensure there is not already an duplicate clearance open for this procedure. - Under Visit Info/Reason for Call, type in Other and utilize the format Clearance MM/DD/YY or Clearance TBD. Do not use dashes or single digits. - If request is for dental extraction, please clarify the # of teeth to be extracted.  Request for surgical clearance:  1. What type of surgery is being performed? Port a cath insertion   2. When is this surgery scheduled? 01/16/21   3. What type of clearance is required (medical clearance vs. Pharmacy clearance to hold med vs. Both)? Both  4. Are there any medications that need to be held prior to surgery and how long? Eliquis   5. Practice name and name of physician performing surgery? [facility] [place]- Dr. Noberto Retort   6. What is the office phone number? [phone]   7.   What is the office fax number? [phone]  8.   Anesthesia type (None, local, MAC, general) ? General   Lowella Grip 01/14/2021, 8:54 AM  _________________________________________________________________   (provider comments below)

## 2021-01-14 NOTE — Telephone Encounter (Signed)
Patient with diagnosis of afib on Eliquis for anticoagulation.    Procedure: port a cath insertion Date of procedure: 01/16/21  CHA2DS2-VASc Score = 6  This indicates a 9.7% annual risk of stroke. The patient's score is based upon: CHF History: Yes HTN History: Yes Diabetes History: Yes Stroke History: No Vascular Disease History: Yes Age Score: 2 Gender Score: 0     CrCl 50 ml/min Platelet count 131  Per office protocol, patient can hold Eliquis for 1 day prior to procedure.

## 2021-01-15 ENCOUNTER — Ambulatory Visit
Admission: RE | Admit: 2021-01-15 | Discharge: 2021-01-15 | Disposition: A | Discharge status: Home / Self Care | Payer: Medicare Other | Source: Ambulatory Visit | Attending: Oncology | Admitting: Oncology

## 2021-01-15 ENCOUNTER — Other Ambulatory Visit: Payer: Self-pay

## 2021-01-15 DIAGNOSIS — C3490 Malignant neoplasm of unspecified part of unspecified bronchus or lung: Secondary | ICD-10-CM

## 2021-01-16 DIAGNOSIS — Z87891 Personal history of nicotine dependence: Secondary | ICD-10-CM | POA: Diagnosis not present

## 2021-01-16 DIAGNOSIS — I10 Essential (primary) hypertension: Secondary | ICD-10-CM | POA: Diagnosis not present

## 2021-01-16 DIAGNOSIS — Z452 Encounter for adjustment and management of vascular access device: Secondary | ICD-10-CM | POA: Diagnosis not present

## 2021-01-16 DIAGNOSIS — J45909 Unspecified asthma, uncomplicated: Secondary | ICD-10-CM | POA: Diagnosis not present

## 2021-01-16 DIAGNOSIS — Z7984 Long term (current) use of oral hypoglycemic drugs: Secondary | ICD-10-CM | POA: Diagnosis not present

## 2021-01-16 DIAGNOSIS — Z7901 Long term (current) use of anticoagulants: Secondary | ICD-10-CM | POA: Diagnosis not present

## 2021-01-16 DIAGNOSIS — C3491 Malignant neoplasm of unspecified part of right bronchus or lung: Secondary | ICD-10-CM | POA: Diagnosis not present

## 2021-01-16 DIAGNOSIS — K219 Gastro-esophageal reflux disease without esophagitis: Secondary | ICD-10-CM | POA: Diagnosis not present

## 2021-01-16 DIAGNOSIS — E119 Type 2 diabetes mellitus without complications: Secondary | ICD-10-CM | POA: Diagnosis not present

## 2021-01-16 DIAGNOSIS — C771 Secondary and unspecified malignant neoplasm of intrathoracic lymph nodes: Secondary | ICD-10-CM | POA: Diagnosis not present

## 2021-01-19 ENCOUNTER — Other Ambulatory Visit: Payer: Self-pay

## 2021-01-19 ENCOUNTER — Other Ambulatory Visit: Payer: Self-pay | Admitting: Hematology and Oncology

## 2021-01-19 ENCOUNTER — Emergency Department (HOSPITAL_COMMUNITY)
Admission: EM | Admit: 2021-01-19 | Discharge: 2021-01-19 | Disposition: A | Discharge status: Home / Self Care | Payer: Medicare Other | Attending: Emergency Medicine | Admitting: Emergency Medicine

## 2021-01-19 ENCOUNTER — Emergency Department (HOSPITAL_COMMUNITY): Payer: Medicare Other

## 2021-01-19 DIAGNOSIS — Z743 Need for continuous supervision: Secondary | ICD-10-CM | POA: Diagnosis not present

## 2021-01-19 DIAGNOSIS — S0990XA Unspecified injury of head, initial encounter: Secondary | ICD-10-CM | POA: Diagnosis not present

## 2021-01-19 DIAGNOSIS — C3490 Malignant neoplasm of unspecified part of unspecified bronchus or lung: Secondary | ICD-10-CM

## 2021-01-19 DIAGNOSIS — W19XXXA Unspecified fall, initial encounter: Secondary | ICD-10-CM | POA: Insufficient documentation

## 2021-01-19 DIAGNOSIS — S51812A Laceration without foreign body of left forearm, initial encounter: Secondary | ICD-10-CM | POA: Diagnosis not present

## 2021-01-19 DIAGNOSIS — R6889 Other general symptoms and signs: Secondary | ICD-10-CM | POA: Diagnosis not present

## 2021-01-19 DIAGNOSIS — R519 Headache, unspecified: Secondary | ICD-10-CM | POA: Diagnosis not present

## 2021-01-19 DIAGNOSIS — C155 Malignant neoplasm of lower third of esophagus: Secondary | ICD-10-CM | POA: Diagnosis not present

## 2021-01-19 DIAGNOSIS — S59912A Unspecified injury of left forearm, initial encounter: Secondary | ICD-10-CM | POA: Diagnosis present

## 2021-01-19 DIAGNOSIS — I499 Cardiac arrhythmia, unspecified: Secondary | ICD-10-CM | POA: Diagnosis not present

## 2021-01-19 DIAGNOSIS — I4891 Unspecified atrial fibrillation: Secondary | ICD-10-CM | POA: Diagnosis not present

## 2021-01-19 DIAGNOSIS — R404 Transient alteration of awareness: Secondary | ICD-10-CM | POA: Diagnosis not present

## 2021-01-19 LAB — CBC WITH DIFFERENTIAL/PLATELET
Abs Immature Granulocytes: 0.04 10*3/uL (ref 0.00–0.07)
Basophils Absolute: 0 10*3/uL (ref 0.0–0.1)
Basophils Relative: 0 %
Eosinophils Absolute: 0.1 10*3/uL (ref 0.0–0.5)
Eosinophils Relative: 1 %
HCT: 44.6 % (ref 39.0–52.0)
Hemoglobin: 15.1 g/dL (ref 13.0–17.0)
Immature Granulocytes: 1 %
Lymphocytes Relative: 5 %
Lymphs Abs: 0.4 10*3/uL — ABNORMAL LOW (ref 0.7–4.0)
MCH: 32.4 pg (ref 26.0–34.0)
MCHC: 33.9 g/dL (ref 30.0–36.0)
MCV: 95.7 fL (ref 80.0–100.0)
Monocytes Absolute: 0.6 10*3/uL (ref 0.1–1.0)
Monocytes Relative: 8 %
Neutro Abs: 6.5 10*3/uL (ref 1.7–7.7)
Neutrophils Relative %: 85 %
Platelets: 117 10*3/uL — ABNORMAL LOW (ref 150–400)
RBC: 4.66 MIL/uL (ref 4.22–5.81)
RDW: 14.7 % (ref 11.5–15.5)
WBC: 7.7 10*3/uL (ref 4.0–10.5)
nRBC: 0 % (ref 0.0–0.2)

## 2021-01-19 LAB — COMPREHENSIVE METABOLIC PANEL
ALT: 14 U/L (ref 0–44)
AST: 23 U/L (ref 15–41)
Albumin: 3.4 g/dL — ABNORMAL LOW (ref 3.5–5.0)
Alkaline Phosphatase: 134 U/L — ABNORMAL HIGH (ref 38–126)
Anion gap: 10 (ref 5–15)
BUN: 24 mg/dL — ABNORMAL HIGH (ref 8–23)
CO2: 31 mmol/L (ref 22–32)
Calcium: 9.7 mg/dL (ref 8.9–10.3)
Chloride: 95 mmol/L — ABNORMAL LOW (ref 98–111)
Creatinine, Ser: 1.46 mg/dL — ABNORMAL HIGH (ref 0.61–1.24)
GFR, Estimated: 50 mL/min — ABNORMAL LOW (ref >60)
Glucose, Bld: 134 mg/dL — ABNORMAL HIGH (ref 70–99)
Potassium: 3.9 mmol/L (ref 3.5–5.1)
Sodium: 136 mmol/L (ref 135–145)
Total Bilirubin: 1.9 mg/dL — ABNORMAL HIGH (ref 0.3–1.2)
Total Protein: 5.9 g/dL — ABNORMAL LOW (ref 6.5–8.1)

## 2021-01-19 LAB — URINALYSIS, ROUTINE W REFLEX MICROSCOPIC
Bilirubin Urine: NEGATIVE
Glucose, UA: NEGATIVE mg/dL
Hgb urine dipstick: NEGATIVE
Ketones, ur: 5 mg/dL — AB
Leukocytes,Ua: NEGATIVE
Nitrite: NEGATIVE
Protein, ur: 30 mg/dL — AB
Specific Gravity, Urine: 1.023 (ref 1.005–1.030)
pH: 6 (ref 5.0–8.0)

## 2021-01-19 LAB — CK: Total CK: 53 U/L (ref 49–397)

## 2021-01-19 MED ORDER — LACTATED RINGERS IV BOLUS
1000.0000 mL | Freq: Once | INTRAVENOUS | Status: AC
  Administered 2021-01-19: 1000 mL via INTRAVENOUS

## 2021-01-19 NOTE — ED Notes (Signed)
Pt was able to ambulate to and from the restroom.

## 2021-01-19 NOTE — ED Provider Notes (Signed)
[facility] EMERGENCY DEPARTMENT Provider Note   CSN: [id number] Arrival date & time: 01/19/21  4098     History No chief complaint on file.   Juan Valdez is a 75 y.o. male.  HPI  Patient felt lightheaded and fell.  Thinks he probably hit his head and is on blood thinners.  He states has been dehydrated recently secondary to diagnosis of esophageal cancer decreased p.o. intake.  He is also lost a lot of weight related to the same.  He states that he thinks that is probably why he got lightheaded today and nearly passed out. Also has skin tear to left forearm    No past medical history on file.  There are no problems to display for this patient.  No family history on file.   Home Medications Prior to Admission medications   Not on File    Allergies    Patient has no allergy information on record.  Review of Systems   Review of Systems  All other systems reviewed and are negative.   Physical Exam Updated Vital Signs BP (!) 153/80 (BP Location: Right Arm)   Pulse 71   Temp 97.9 F (36.6 C) (Oral)   Resp 20   SpO2 96%   Physical Exam Vitals and nursing note reviewed.  Constitutional:      Appearance: He is well-developed.  HENT:     Head: Normocephalic and atraumatic.     Nose: Nose normal. No congestion or rhinorrhea.     Mouth/Throat:     Mouth: Mucous membranes are moist.     Pharynx: Oropharynx is clear.  Cardiovascular:     Rate and Rhythm: Normal rate.  Pulmonary:     Effort: Pulmonary effort is normal. No respiratory distress.  Abdominal:     General: There is no distension.  Musculoskeletal:        General: No swelling or tenderness. Normal range of motion.     Cervical back: Normal range of motion.  Skin:    General: Skin is warm and dry.     Comments: Skin tear to left forearm  Neurological:     General: No focal deficit present.     Mental Status: He is alert.     ED Results / Procedures / Treatments    Labs (all labs ordered are listed, but only abnormal results are displayed) Labs Reviewed  CBC WITH DIFFERENTIAL/PLATELET - Abnormal; Notable for the following components:      Result Value   Platelets 117 (*)    Lymphs Abs 0.4 (*)    All other components within normal limits  COMPREHENSIVE METABOLIC PANEL - Abnormal; Notable for the following components:   Chloride 95 (*)    Glucose, Bld 134 (*)    BUN 24 (*)    Creatinine, Ser 1.46 (*)    Total Protein 5.9 (*)    Albumin 3.4 (*)    Alkaline Phosphatase 134 (*)    Total Bilirubin 1.9 (*)    GFR, Estimated 50 (*)    All other components within normal limits  URINALYSIS, ROUTINE W REFLEX MICROSCOPIC - Abnormal; Notable for the following components:   Color, Urine AMBER (*)    Ketones, ur 5 (*)    Protein, ur 30 (*)    Bacteria, UA RARE (*)    All other components within normal limits  CK    EKG None  Radiology CT Head Wo Contrast  Result Date: 01/19/2021 CLINICAL DATA:  Near syncopal  episode followed by fall this morning. Complains of occipital headache. EXAM: CT HEAD WITHOUT CONTRAST TECHNIQUE: Contiguous axial images were obtained from the base of the skull through the vertex without intravenous contrast. COMPARISON:  None. FINDINGS: Brain: No evidence of acute infarction, hemorrhage, hydrocephalus, extra-axial collection or mass lesion/mass effect. Prominence of the sulci and ventricles compatible with age related brain atrophy. Vascular: No hyperdense vessel or unexpected calcification. Skull: Normal. Negative for fracture or focal lesion. Sinuses/Orbits: Paranasal sinuses and mastoid air cells are clear. Other: None IMPRESSION: 1. No acute intracranial abnormalities. Electronically Signed   By: Kerby Moors M.D.   On: 01/19/2021 07:44    Procedures Procedures   Medications Ordered in ED Medications  lactated ringers bolus 1,000 mL (0 mLs Intravenous Stopped 01/19/21 0846)  lactated ringers bolus 1,000 mL (0 mLs  Intravenous Stopped 01/19/21 0951)    ED Course  I have reviewed the triage vital signs and the nursing notes.  Pertinent labs & imaging results that were available during my care of the patient were reviewed by me and considered in my medical decision making (see chart for details).    MDM Rules/Calculators/A&P                          Head CT is unremarkable.  Does have elevated creatinine however unsure of his baseline.  When I went to reevaluate him he did have some urine at bedside and it was tea colored.  Secondary to this we will add on a CK and a UA and give him an extra bag of fluids prior to discharge.  I think is probably related to dehydration secondary to decreased p.o. intake because of esophageal cancer.  However he did feel lightheadedAnd it did contribute to his fall so I do feel is necessary at rehydrate him and get orthostatics or at least make sure he is not symptomatic when walking.  Care transferred pending urinalysis and CK.  Likely discharge  Final Clinical Impression(s) / ED Diagnoses Final diagnoses:  Fall, initial encounter  Minor head injury, initial encounter    Rx / DC Orders ED Discharge Orders    None       Kiriana Worthington, Corene Cornea, MD 01/20/21 [phone]

## 2021-01-19 NOTE — ED Provider Notes (Signed)
I received pt in signout from Dr. Dayna Barker. He had presented w/ fall from standing w/ head injury, head CT negative acute.  Labs showed a creatinine 1.46 with no previous for comparison.  At time of signout, patient had been noted to have dark urine and therefore IV fluids were added along with CK and urinalysis.  UA with trace ketones and protein but no evidence of infection or hematuria.  CK normal.  Patient's vital signs have remained stable here.  Reviewed return precautions regarding head injury.   Juan Valdez, Wenda Overland, MD 01/19/21 1022

## 2021-01-19 NOTE — ED Triage Notes (Signed)
Pt here from home as a level 2 fall on thinners hit the back of his head , fall was from a near syncopal episode , recent dx of cancer but has not started tx yet

## 2021-01-20 ENCOUNTER — Other Ambulatory Visit: Payer: Self-pay | Admitting: Oncology

## 2021-01-20 ENCOUNTER — Telehealth: Payer: Self-pay | Admitting: *Deleted

## 2021-01-20 DIAGNOSIS — Z6822 Body mass index (BMI) 22.0-22.9, adult: Secondary | ICD-10-CM | POA: Diagnosis not present

## 2021-01-20 DIAGNOSIS — Z85118 Personal history of other malignant neoplasm of bronchus and lung: Secondary | ICD-10-CM | POA: Diagnosis not present

## 2021-01-20 DIAGNOSIS — C3410 Malignant neoplasm of upper lobe, unspecified bronchus or lung: Secondary | ICD-10-CM

## 2021-01-20 DIAGNOSIS — Z09 Encounter for follow-up examination after completed treatment for conditions other than malignant neoplasm: Secondary | ICD-10-CM | POA: Insufficient documentation

## 2021-01-20 DIAGNOSIS — S50819A Abrasion of unspecified forearm, initial encounter: Secondary | ICD-10-CM | POA: Diagnosis not present

## 2021-01-20 NOTE — Progress Notes (Signed)
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  Patient Characteristics: Local Recurrence Therapeutic Status: Local Recurrence

## 2021-01-20 NOTE — Progress Notes (Signed)
DISCONTINUE ON PATHWAY REGIMEN - Non-Small Cell Lung  No Medical Intervention - Off Treatment.  REASON: Other Reason PRIOR TREATMENT: Off Treatment  START OFF PATHWAY REGIMEN - Non-Small Cell Lung   OFF00999:Carboplatin AUC 2 + Paclitaxel 50 mg/m2 weekly + RT:   Administer weekly during RT:     Paclitaxel      Carboplatin   **Always confirm dose/schedule in your pharmacy ordering system**  Patient Characteristics: Local Recurrence Therapeutic Status: Local Recurrence Intent of Therapy: Curative Intent, Discussed with Patient

## 2021-01-20 NOTE — Progress Notes (Signed)
DISCONTINUE ON PATHWAY REGIMEN - Non-Small Cell Lung  No Medical Intervention - Off Treatment.  REASON: Other Reason PRIOR TREATMENT: Off Treatment  START ON PATHWAY REGIMEN - Non-Small Cell Lung     Administer weekly:     Paclitaxel      Carboplatin   **Always confirm dose/schedule in your pharmacy ordering system**  Patient Characteristics: Preoperative or Nonsurgical Candidate (Clinical Staging), Stage III - Nonsurgical Candidate (Nonsquamous and Squamous), PS = 0, 1 Therapeutic Status: Preoperative or Nonsurgical Candidate (Clinical Staging) AJCC T Category: cT0 AJCC N Category: cN2 AJCC M Category: cM0 AJCC 8 Stage Grouping: Unknown ECOG Performance Status: 1 Intent of Therapy: Curative Intent, Discussed with Patient

## 2021-01-20 NOTE — Telephone Encounter (Signed)
===  View-only below this line=== ----- Message ----- From: Oda Kilts, CMA Sent: 01/05/2021   2:52 PM EDT To: Oda Kilts, CMA Subject: RE: Colon/EGD See if pt needs to schedule      Looks like this patients PET scan and MRI of brain to check for staging is still pending scheduling... By other office notes he does not want to schedule anything else at this time besides PET   ----- Message ----- From: Oda Kilts, CMA Sent: 01/03/2021  12:00 AM EDT To: Oda Kilts, CMA Subject: Colon/EGD See if pt needs to schedule          New dx of lung cancer wants call back in 1 month   Check on patient

## 2021-01-20 NOTE — Progress Notes (Signed)
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  Patient Characteristics: Local Recurrence Therapeutic Status: Local Recurrence

## 2021-01-20 NOTE — Progress Notes (Signed)
START OFF PATHWAY REGIMEN - Non-Small Cell Lung   OFF02534:Carboplatin + Paclitaxel (2/50) + RT weekly x 6 weeks:   Administer weekly during RT:     Paclitaxel      Carboplatin   **Always confirm dose/schedule in your pharmacy ordering system**  Patient Characteristics: Local Recurrence Therapeutic Status: Local Recurrence Intent of Therapy: Curative Intent, Discussed with Patient

## 2021-01-21 DIAGNOSIS — C3481 Malignant neoplasm of overlapping sites of right bronchus and lung: Secondary | ICD-10-CM | POA: Diagnosis not present

## 2021-01-21 DIAGNOSIS — C383 Malignant neoplasm of mediastinum, part unspecified: Secondary | ICD-10-CM | POA: Diagnosis not present

## 2021-01-21 DIAGNOSIS — C771 Secondary and unspecified malignant neoplasm of intrathoracic lymph nodes: Secondary | ICD-10-CM | POA: Diagnosis not present

## 2021-01-21 DIAGNOSIS — Z510 Encounter for antineoplastic radiation therapy: Secondary | ICD-10-CM | POA: Diagnosis not present

## 2021-01-27 ENCOUNTER — Other Ambulatory Visit: Payer: Self-pay | Admitting: Hematology and Oncology

## 2021-01-27 DIAGNOSIS — R0789 Other chest pain: Secondary | ICD-10-CM

## 2021-01-27 MED ORDER — DRONABINOL 10 MG PO CAPS: 10.0000 mg | ORAL_CAPSULE | Freq: Two times a day (BID) | ORAL | 0 refills | Status: DC

## 2021-01-27 MED ORDER — HYDROCODONE-ACETAMINOPHEN 10-325 MG PO TABS: 1.0000 | ORAL_TABLET | Freq: Four times a day (QID) | ORAL | 0 refills | Status: DC | PRN

## 2021-01-29 NOTE — Telephone Encounter (Signed)
Called patient to see how he is doing and if he is ready to schedule colonoscopy, he said he has 7 radiation treatments to go through and he will call when he is ready to schedule

## 2021-01-30 ENCOUNTER — Telehealth: Payer: Self-pay | Admitting: Hematology and Oncology

## 2021-01-30 ENCOUNTER — Encounter: Payer: Self-pay | Admitting: Hematology and Oncology

## 2021-01-30 ENCOUNTER — Inpatient Hospital Stay: Payer: Medicare Other | Attending: Oncology | Admitting: Hematology and Oncology

## 2021-01-30 ENCOUNTER — Other Ambulatory Visit: Payer: Self-pay | Admitting: Hematology and Oncology

## 2021-01-30 ENCOUNTER — Inpatient Hospital Stay: Payer: Medicare Other

## 2021-01-30 ENCOUNTER — Other Ambulatory Visit: Payer: Self-pay

## 2021-01-30 VITALS — BP 145/69 | HR 71 | Temp 98.1°F | Resp 18 | Ht 69.0 in | Wt 141.6 lb

## 2021-01-30 DIAGNOSIS — C3410 Malignant neoplasm of upper lobe, unspecified bronchus or lung: Secondary | ICD-10-CM

## 2021-01-30 DIAGNOSIS — Z87891 Personal history of nicotine dependence: Secondary | ICD-10-CM | POA: Insufficient documentation

## 2021-01-30 DIAGNOSIS — C799 Secondary malignant neoplasm of unspecified site: Secondary | ICD-10-CM | POA: Insufficient documentation

## 2021-01-30 DIAGNOSIS — R0789 Other chest pain: Secondary | ICD-10-CM

## 2021-01-30 DIAGNOSIS — Z5111 Encounter for antineoplastic chemotherapy: Secondary | ICD-10-CM | POA: Insufficient documentation

## 2021-01-30 DIAGNOSIS — C3490 Malignant neoplasm of unspecified part of unspecified bronchus or lung: Secondary | ICD-10-CM | POA: Insufficient documentation

## 2021-01-30 DIAGNOSIS — Z510 Encounter for antineoplastic radiation therapy: Secondary | ICD-10-CM | POA: Diagnosis not present

## 2021-01-30 DIAGNOSIS — C383 Malignant neoplasm of mediastinum, part unspecified: Secondary | ICD-10-CM | POA: Diagnosis not present

## 2021-01-30 DIAGNOSIS — C3480 Malignant neoplasm of overlapping sites of unspecified bronchus and lung: Secondary | ICD-10-CM

## 2021-01-30 LAB — HEPATIC FUNCTION PANEL
ALT: 15 (ref 10–40)
AST: 23 (ref 14–40)
Alkaline Phosphatase: 123 (ref 25–125)
Bilirubin, Total: 2.5

## 2021-01-30 LAB — BASIC METABOLIC PANEL
BUN: 27 — AB (ref 4–21)
CO2: 29 — AB (ref 13–22)
Chloride: 94 — AB (ref 99–108)
Creatinine: 1.2 (ref 0.6–1.3)
Glucose: 109
Potassium: 3.7 (ref 3.4–5.3)
Sodium: 133 — AB (ref 137–147)

## 2021-01-30 LAB — CBC AND DIFFERENTIAL
HCT: 44 (ref 41–53)
Hemoglobin: 15.1 (ref 13.5–17.5)
Neutrophils Absolute: 7.22
Platelets: 139 — AB (ref 150–399)
WBC: 8.6

## 2021-01-30 LAB — COMPREHENSIVE METABOLIC PANEL
Albumin: 3.9 (ref 3.5–5.0)
Calcium: 9.6 (ref 8.7–10.7)

## 2021-01-30 LAB — CBC: RBC: 4.7 (ref 3.87–5.11)

## 2021-01-30 MED ORDER — DEXAMETHASONE 4 MG PO TABS: ORAL_TABLET | ORAL | 0 refills | Status: DC

## 2021-01-30 MED ORDER — DEXAMETHASONE 4 MG PO TABS: ORAL_TABLET | ORAL | 0 refills | Status: AC

## 2021-01-30 MED ORDER — HYDROCODONE-ACETAMINOPHEN 10-325 MG PO TABS: 1.0000 | ORAL_TABLET | ORAL | 0 refills | Status: DC | PRN

## 2021-01-30 NOTE — Progress Notes (Signed)
The patient is a 75 year old male with lung cancer.  Patient presents to clinic today with his wife for chemotherapy education and palliative care consult.  We will start carboplatin and paclitaxel.  We will send in prescriptions for dexamethasone, prochlorperazine and ondansetron.  The patient verbalizes understanding of and agreement to the plan as discussed today.  Provided general information including the following: 1.  Date of education: 01-30-2021 2.  Physician name: Dr. Bobby Rumpf 3.  Diagnosis: Lung Cancer 4.  Stage: Metastatic 5.  Curative  6.  Chemotherapy plan including drugs and how often: Paclitaxel/ Carboplatin weekly 7.  Start date: 02-03-2021 8.  Other referrals: No other referrals at this time 9.  The patient is to call our office with any questions or concerns.  Our office number [phone], if after hours or on the weekend, call the same number and wait for the answering service.  There is always an oncologist on call 10.  Medications prescribed: dexamethasone, prochlorperazine and ondansetron 11.  The patient has verbalized understanding of the treatment plan and has no barriers to adherence or understanding.  Obtained signed consent from patient.  Discussed symptoms including 1.  Low blood counts including red blood cells, white blood cells and platelets. 2. Infection including to avoid large crowds, wash hands frequently, and stay away from people who were sick.  If fever develops of 100.4 or higher, call our office. 3.  Mucositis-given instructions on mouth rinse (baking soda and salt mixture).  Keep mouth clean.  Use soft bristle toothbrush.  If mouth sores develop, call our clinic. 4.  Nausea/vomiting-gave prescriptions for ondansetron 4 mg every 4 hours as needed for nausea, may take around the clock if persistent.  Compazine 10 mg every 6 hours, may take around the clock if persistent. 5.  Diarrhea-use over-the-counter Imodium.  Call clinic if not controlled. 6.   Constipation-use senna, 1 to 2 tablets twice a day.  If no BM in 2 to 3 days call the clinic. 7.  Loss of appetite-try to eat small meals every 2-3 hours.  Call clinic if not eating. 8.  Taste changes-zinc 500 mg daily.  If becomes severe call clinic. 9.  Alcoholic beverages. 10.  Drink 2 to 3 quarts of water per day. 11.  Peripheral neuropathy-patient to call if numbness or tingling in hands or feet is persistent  Gave information on the supportive care team and how to contact them regarding services.  Discussed advanced directives.  The patient does not have their advanced directives but will look at the copy provided in their notebook and will call with any questions. [employer] Social worker Advanced directives  Answered questions to patient satisfaction.  Patient is to call with any further questions or concerns.  Dayton Scrape, FNP- [facility]

## 2021-01-30 NOTE — Telephone Encounter (Signed)
Per Lenna Sciara print Calendars for Med/Radonc Appt's - Gave patient Appt Calendar plus map  Patient is requesting all Chemo on Friday's will this be ok

## 2021-02-02 ENCOUNTER — Encounter: Payer: Self-pay | Admitting: Oncology

## 2021-02-02 DIAGNOSIS — C383 Malignant neoplasm of mediastinum, part unspecified: Secondary | ICD-10-CM | POA: Diagnosis not present

## 2021-02-02 DIAGNOSIS — Z510 Encounter for antineoplastic radiation therapy: Secondary | ICD-10-CM | POA: Diagnosis not present

## 2021-02-02 NOTE — Progress Notes (Signed)
Reduce dose of paclitaxel by 20% due to elevated bilirubin of 2.5 per Dr. Bobby Rumpf.  Bilirubin in March was 1.5.  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  Will continue to monitor.

## 2021-02-03 ENCOUNTER — Inpatient Hospital Stay: Payer: Medicare Other

## 2021-02-03 ENCOUNTER — Encounter: Payer: Self-pay | Admitting: Oncology

## 2021-02-03 ENCOUNTER — Other Ambulatory Visit: Payer: Self-pay

## 2021-02-03 VITALS — BP 144/78 | HR 65 | Temp 98.1°F | Resp 18 | Wt 139.0 lb

## 2021-02-03 DIAGNOSIS — Z510 Encounter for antineoplastic radiation therapy: Secondary | ICD-10-CM | POA: Diagnosis not present

## 2021-02-03 DIAGNOSIS — C3490 Malignant neoplasm of unspecified part of unspecified bronchus or lung: Secondary | ICD-10-CM | POA: Diagnosis not present

## 2021-02-03 DIAGNOSIS — C799 Secondary malignant neoplasm of unspecified site: Secondary | ICD-10-CM | POA: Diagnosis not present

## 2021-02-03 DIAGNOSIS — C383 Malignant neoplasm of mediastinum, part unspecified: Secondary | ICD-10-CM | POA: Diagnosis not present

## 2021-02-03 DIAGNOSIS — C3410 Malignant neoplasm of upper lobe, unspecified bronchus or lung: Secondary | ICD-10-CM

## 2021-02-03 DIAGNOSIS — Z5111 Encounter for antineoplastic chemotherapy: Secondary | ICD-10-CM | POA: Diagnosis not present

## 2021-02-03 DIAGNOSIS — Z87891 Personal history of nicotine dependence: Secondary | ICD-10-CM | POA: Diagnosis not present

## 2021-02-03 MED ORDER — SODIUM CHLORIDE 0.9 % IV SOLN
Freq: Once | INTRAVENOUS | Status: AC
Start: 2021-02-03 — End: 2021-02-03
  Filled 2021-02-03: qty 250

## 2021-02-03 MED ORDER — HEPARIN SOD (PORK) LOCK FLUSH 100 UNIT/ML IV SOLN
500.0000 [IU] | Freq: Once | INTRAVENOUS | Status: AC | PRN
  Administered 2021-02-03: 500 [IU]
  Filled 2021-02-03: qty 5

## 2021-02-03 MED ORDER — DIPHENHYDRAMINE HCL 50 MG/ML IJ SOLN
50.0000 mg | Freq: Once | INTRAMUSCULAR | Status: AC
  Administered 2021-02-03: 50 mg via INTRAVENOUS

## 2021-02-03 MED ORDER — SODIUM CHLORIDE 0.9% FLUSH
10.0000 mL | INTRAVENOUS | Status: DC | PRN
  Administered 2021-02-03: 10 mL
  Filled 2021-02-03: qty 10

## 2021-02-03 MED ORDER — DEXAMETHASONE SODIUM PHOSPHATE 100 MG/10ML IJ SOLN
10.0000 mg | Freq: Once | INTRAMUSCULAR | Status: AC
  Administered 2021-02-03: 10 mg via INTRAVENOUS
  Filled 2021-02-03: qty 10

## 2021-02-03 MED ORDER — FAMOTIDINE IN NACL 20-0.9 MG/50ML-% IV SOLN
20.0000 mg | Freq: Once | INTRAVENOUS | Status: AC
  Administered 2021-02-03: 20 mg via INTRAVENOUS

## 2021-02-03 MED ORDER — DIPHENHYDRAMINE HCL 50 MG/ML IJ SOLN
INTRAMUSCULAR | Status: AC
  Filled 2021-02-03: qty 1

## 2021-02-03 MED ORDER — FAMOTIDINE IN NACL 20-0.9 MG/50ML-% IV SOLN
INTRAVENOUS | Status: AC
  Filled 2021-02-03: qty 50

## 2021-02-03 MED ORDER — PALONOSETRON HCL INJECTION 0.25 MG/5ML
0.2500 mg | Freq: Once | INTRAVENOUS | Status: AC
  Administered 2021-02-03: 0.25 mg via INTRAVENOUS

## 2021-02-03 MED ORDER — PALONOSETRON HCL INJECTION 0.25 MG/5ML
INTRAVENOUS | Status: AC
  Filled 2021-02-03: qty 5

## 2021-02-03 MED ORDER — SODIUM CHLORIDE 0.9 % IV SOLN
36.0000 mg/m2 | Freq: Once | INTRAVENOUS | Status: AC
  Administered 2021-02-03: 66 mg via INTRAVENOUS
  Filled 2021-02-03: qty 11

## 2021-02-03 MED ORDER — SODIUM CHLORIDE 0.9 % IV SOLN
150.2000 mg | Freq: Once | INTRAVENOUS | Status: AC
  Administered 2021-02-03: 150 mg via INTRAVENOUS
  Filled 2021-02-03: qty 15

## 2021-02-03 NOTE — Patient Instructions (Signed)
Carboplatin injection What is this medicine? CARBOPLATIN (KAR boe pla tin) is a chemotherapy drug. It targets fast dividing cells, like cancer cells, and causes these cells to die. This medicine is used to treat ovarian cancer and many other cancers. This medicine may be used for other purposes; ask your health care provider or pharmacist if you have questions. COMMON BRAND NAME(S): Paraplatin What should I tell my health care provider before I take this medicine? They need to know if you have any of these conditions:  blood disorders  hearing problems  kidney disease  recent or ongoing radiation therapy  an unusual or allergic reaction to carboplatin, cisplatin, other chemotherapy, other medicines, foods, dyes, or preservatives  pregnant or trying to get pregnant  breast-feeding How should I use this medicine? This drug is usually given as an infusion into a vein. It is administered in a hospital or clinic by a specially trained health care professional. Talk to your pediatrician regarding the use of this medicine in children. Special care may be needed. Overdosage: If you think you have taken too much of this medicine contact a poison control center or emergency room at once. NOTE: This medicine is only for you. Do not share this medicine with others. What if I miss a dose? It is important not to miss a dose. Call your doctor or health care professional if you are unable to keep an appointment. What may interact with this medicine?  medicines for seizures  medicines to increase blood counts like filgrastim, pegfilgrastim, sargramostim  some antibiotics like amikacin, gentamicin, neomycin, streptomycin, tobramycin  vaccines Talk to your doctor or health care professional before taking any of these medicines:  acetaminophen  aspirin  ibuprofen  ketoprofen  naproxen This list may not describe all possible interactions. Give your health care provider a list of all the  medicines, herbs, non-prescription drugs, or dietary supplements you use. Also tell them if you smoke, drink alcohol, or use illegal drugs. Some items may interact with your medicine. What should I watch for while using this medicine? Your condition will be monitored carefully while you are receiving this medicine. You will need important blood work done while you are taking this medicine. This drug may make you feel generally unwell. This is not uncommon, as chemotherapy can affect healthy cells as well as cancer cells. Report any side effects. Continue your course of treatment even though you feel ill unless your doctor tells you to stop. In some cases, you may be given additional medicines to help with side effects. Follow all directions for their use. Call your doctor or health care professional for advice if you get a fever, chills or sore throat, or other symptoms of a cold or flu. Do not treat yourself. This drug decreases your body's ability to fight infections. Try to avoid being around people who are sick. This medicine may increase your risk to bruise or bleed. Call your doctor or health care professional if you notice any unusual bleeding. Be careful brushing and flossing your teeth or using a toothpick because you may get an infection or bleed more easily. If you have any dental work done, tell your dentist you are receiving this medicine. Avoid taking products that contain aspirin, acetaminophen, ibuprofen, naproxen, or ketoprofen unless instructed by your doctor. These medicines may hide a fever. Do not become pregnant while taking this medicine. Women should inform their doctor if they wish to become pregnant or think they might be pregnant. There is a  potential for serious side effects to an unborn child. Talk to your health care professional or pharmacist for more information. Do not breast-feed an infant while taking this medicine. What side effects may I notice from receiving this  medicine? Side effects that you should report to your doctor or health care professional as soon as possible:  allergic reactions like skin rash, itching or hives, swelling of the face, lips, or tongue  signs of infection - fever or chills, cough, sore throat, pain or difficulty passing urine  signs of decreased platelets or bleeding - bruising, pinpoint red spots on the skin, black, tarry stools, nosebleeds  signs of decreased red blood cells - unusually weak or tired, fainting spells, lightheadedness  breathing problems  changes in hearing  changes in vision  chest pain  high blood pressure  low blood counts - This drug may decrease the number of white blood cells, red blood cells and platelets. You may be at increased risk for infections and bleeding.  nausea and vomiting  pain, swelling, redness or irritation at the injection site  pain, tingling, numbness in the hands or feet  problems with balance, talking, walking  trouble passing urine or change in the amount of urine Side effects that usually do not require medical attention (report to your doctor or health care professional if they continue or are bothersome):  hair loss  loss of appetite  metallic taste in the mouth or changes in taste This list may not describe all possible side effects. Call your doctor for medical advice about side effects. You may report side effects to FDA at 1-800-FDA-1088. Where should I keep my medicine? This drug is given in a hospital or clinic and will not be stored at home. NOTE: This sheet is a summary. It may not cover all possible information. If you have questions about this medicine, talk to your doctor, pharmacist, or health care provider.  2021 [employer] Standard (2007-12-05 14:38:05) Paclitaxel injection What is this medicine? PACLITAXEL (PAK li TAX el) is a chemotherapy drug. It targets fast dividing cells, like cancer cells, and causes these cells to die. This  medicine is used to treat ovarian cancer, breast cancer, lung cancer, Kaposi's sarcoma, and other cancers. This medicine may be used for other purposes; ask your health care provider or pharmacist if you have questions. COMMON BRAND NAME(S): Onxol, Taxol What should I tell my health care provider before I take this medicine? They need to know if you have any of these conditions:  history of irregular heartbeat  liver disease  low blood counts, like low white cell, platelet, or red cell counts  lung or breathing disease, like asthma  tingling of the fingers or toes, or other nerve disorder  an unusual or allergic reaction to paclitaxel, alcohol, polyoxyethylated castor oil, other chemotherapy, other medicines, foods, dyes, or preservatives  pregnant or trying to get pregnant  breast-feeding How should I use this medicine? This drug is given as an infusion into a vein. It is administered in a hospital or clinic by a specially trained health care professional. Talk to your pediatrician regarding the use of this medicine in children. Special care may be needed. Overdosage: If you think you have taken too much of this medicine contact a poison control center or emergency room at once. NOTE: This medicine is only for you. Do not share this medicine with others. What if I miss a dose? It is important not to miss your dose. Call your doctor or  health care professional if you are unable to keep an appointment. What may interact with this medicine? Do not take this medicine with any of the following medications:  live virus vaccines This medicine may also interact with the following medications:  antiviral medicines for hepatitis, HIV or AIDS  certain antibiotics like erythromycin and clarithromycin  certain medicines for fungal infections like ketoconazole and itraconazole  certain medicines for seizures like carbamazepine, phenobarbital,  phenytoin  gemfibrozil  nefazodone  rifampin  St. John's wort This list may not describe all possible interactions. Give your health care provider a list of all the medicines, herbs, non-prescription drugs, or dietary supplements you use. Also tell them if you smoke, drink alcohol, or use illegal drugs. Some items may interact with your medicine. What should I watch for while using this medicine? Your condition will be monitored carefully while you are receiving this medicine. You will need important blood work done while you are taking this medicine. This medicine can cause serious allergic reactions. To reduce your risk you will need to take other medicine(s) before treatment with this medicine. If you experience allergic reactions like skin rash, itching or hives, swelling of the face, lips, or tongue, tell your doctor or health care professional right away. In some cases, you may be given additional medicines to help with side effects. Follow all directions for their use. This drug may make you feel generally unwell. This is not uncommon, as chemotherapy can affect healthy cells as well as cancer cells. Report any side effects. Continue your course of treatment even though you feel ill unless your doctor tells you to stop. Call your doctor or health care professional for advice if you get a fever, chills or sore throat, or other symptoms of a cold or flu. Do not treat yourself. This drug decreases your body's ability to fight infections. Try to avoid being around people who are sick. This medicine may increase your risk to bruise or bleed. Call your doctor or health care professional if you notice any unusual bleeding. Be careful brushing and flossing your teeth or using a toothpick because you may get an infection or bleed more easily. If you have any dental work done, tell your dentist you are receiving this medicine. Avoid taking products that contain aspirin, acetaminophen, ibuprofen,  naproxen, or ketoprofen unless instructed by your doctor. These medicines may hide a fever. Do not become pregnant while taking this medicine. Women should inform their doctor if they wish to become pregnant or think they might be pregnant. There is a potential for serious side effects to an unborn child. Talk to your health care professional or pharmacist for more information. Do not breast-feed an infant while taking this medicine. Men are advised not to father a child while receiving this medicine. This product may contain alcohol. Ask your pharmacist or healthcare provider if this medicine contains alcohol. Be sure to tell all healthcare providers you are taking this medicine. Certain medicines, like metronidazole and disulfiram, can cause an unpleasant reaction when taken with alcohol. The reaction includes flushing, headache, nausea, vomiting, sweating, and increased thirst. The reaction can last from 30 minutes to several hours. What side effects may I notice from receiving this medicine? Side effects that you should report to your doctor or health care professional as soon as possible:  allergic reactions like skin rash, itching or hives, swelling of the face, lips, or tongue  breathing problems  changes in vision  fast, irregular heartbeat  high or  low blood pressure  mouth sores  pain, tingling, numbness in the hands or feet  signs of decreased platelets or bleeding - bruising, pinpoint red spots on the skin, black, tarry stools, blood in the urine  signs of decreased red blood cells - unusually weak or tired, feeling faint or lightheaded, falls  signs of infection - fever or chills, cough, sore throat, pain or difficulty passing urine  signs and symptoms of liver injury like dark yellow or brown urine; general ill feeling or flu-like symptoms; light-colored stools; loss of appetite; nausea; right upper belly pain; unusually weak or tired; yellowing of the eyes or  skin  swelling of the ankles, feet, hands  unusually slow heartbeat Side effects that usually do not require medical attention (report to your doctor or health care professional if they continue or are bothersome):  diarrhea  hair loss  loss of appetite  muscle or joint pain  nausea, vomiting  pain, redness, or irritation at site where injected  tiredness This list may not describe all possible side effects. Call your doctor for medical advice about side effects. You may report side effects to FDA at 1-800-FDA-1088. Where should I keep my medicine? This drug is given in a hospital or clinic and will not be stored at home. NOTE: This sheet is a summary. It may not cover all possible information. If you have questions about this medicine, talk to your doctor, pharmacist, or health care provider.  2021 [employer] Standard (2019-08-01 13:37:23)

## 2021-02-04 ENCOUNTER — Telehealth: Payer: Self-pay

## 2021-02-04 DIAGNOSIS — C383 Malignant neoplasm of mediastinum, part unspecified: Secondary | ICD-10-CM | POA: Diagnosis not present

## 2021-02-04 DIAGNOSIS — Z510 Encounter for antineoplastic radiation therapy: Secondary | ICD-10-CM | POA: Diagnosis not present

## 2021-02-04 NOTE — Telephone Encounter (Signed)
I spoke with pt. He states he is doing fine. No N/V, no diarrhea, no skin issues, and no fever. He gets daily radiation as part of treatment as well. Pt reminded of the importance of calling us if temp is 100.4 or higher, DAY OR NIGHT. Pt verbalized understanding.

## 2021-02-05 ENCOUNTER — Inpatient Hospital Stay: Payer: Medicare Other

## 2021-02-05 ENCOUNTER — Other Ambulatory Visit: Payer: Self-pay | Admitting: Hematology and Oncology

## 2021-02-05 DIAGNOSIS — C3410 Malignant neoplasm of upper lobe, unspecified bronchus or lung: Secondary | ICD-10-CM

## 2021-02-05 DIAGNOSIS — C383 Malignant neoplasm of mediastinum, part unspecified: Secondary | ICD-10-CM | POA: Diagnosis not present

## 2021-02-05 DIAGNOSIS — Z510 Encounter for antineoplastic radiation therapy: Secondary | ICD-10-CM | POA: Diagnosis not present

## 2021-02-06 ENCOUNTER — Encounter: Payer: Self-pay | Admitting: Oncology

## 2021-02-06 ENCOUNTER — Ambulatory Visit: Payer: Medicare Other | Admitting: Oncology

## 2021-02-06 ENCOUNTER — Inpatient Hospital Stay: Payer: Medicare Other

## 2021-02-06 ENCOUNTER — Encounter: Payer: Self-pay | Admitting: Hematology and Oncology

## 2021-02-06 ENCOUNTER — Other Ambulatory Visit: Payer: Medicare Other

## 2021-02-06 DIAGNOSIS — Z510 Encounter for antineoplastic radiation therapy: Secondary | ICD-10-CM | POA: Diagnosis not present

## 2021-02-06 DIAGNOSIS — C383 Malignant neoplasm of mediastinum, part unspecified: Secondary | ICD-10-CM | POA: Diagnosis not present

## 2021-02-06 DIAGNOSIS — C3490 Malignant neoplasm of unspecified part of unspecified bronchus or lung: Secondary | ICD-10-CM | POA: Diagnosis not present

## 2021-02-06 DIAGNOSIS — C3410 Malignant neoplasm of upper lobe, unspecified bronchus or lung: Secondary | ICD-10-CM

## 2021-02-06 LAB — BASIC METABOLIC PANEL
BUN: 35 — AB (ref 4–21)
CO2: 35 — AB (ref 13–22)
Chloride: 92 — AB (ref 99–108)
Creatinine: 1.1 (ref 0.6–1.3)
Glucose: 143
Potassium: 3.5 (ref 3.4–5.3)
Sodium: 132 — AB (ref 137–147)

## 2021-02-06 LAB — COMPREHENSIVE METABOLIC PANEL
Albumin: 4 (ref 3.5–5.0)
Calcium: 9.6 (ref 8.7–10.7)

## 2021-02-06 LAB — CBC AND DIFFERENTIAL
HCT: 45 (ref 41–53)
Hemoglobin: 15.6 (ref 13.5–17.5)
Neutrophils Absolute: 7.44
Platelets: 113 — AB (ref 150–399)
WBC: 8

## 2021-02-06 LAB — HEPATIC FUNCTION PANEL
ALT: 23 (ref 10–40)
AST: 27 (ref 14–40)
Alkaline Phosphatase: 107 (ref 25–125)
Bilirubin, Total: 2.4

## 2021-02-06 LAB — CBC: RBC: 4.85 (ref 3.87–5.11)

## 2021-02-09 DIAGNOSIS — D649 Anemia, unspecified: Secondary | ICD-10-CM | POA: Diagnosis not present

## 2021-02-09 DIAGNOSIS — E119 Type 2 diabetes mellitus without complications: Secondary | ICD-10-CM | POA: Diagnosis not present

## 2021-02-09 DIAGNOSIS — I4891 Unspecified atrial fibrillation: Secondary | ICD-10-CM | POA: Diagnosis not present

## 2021-02-10 ENCOUNTER — Inpatient Hospital Stay: Payer: Medicare Other

## 2021-02-10 ENCOUNTER — Other Ambulatory Visit: Payer: Self-pay

## 2021-02-10 ENCOUNTER — Other Ambulatory Visit: Payer: Self-pay | Admitting: Hematology and Oncology

## 2021-02-10 VITALS — BP 108/73 | HR 75 | Temp 98.1°F | Resp 18 | Wt 134.0 lb

## 2021-02-10 DIAGNOSIS — Z87891 Personal history of nicotine dependence: Secondary | ICD-10-CM | POA: Diagnosis not present

## 2021-02-10 DIAGNOSIS — C3490 Malignant neoplasm of unspecified part of unspecified bronchus or lung: Secondary | ICD-10-CM

## 2021-02-10 DIAGNOSIS — Z510 Encounter for antineoplastic radiation therapy: Secondary | ICD-10-CM | POA: Diagnosis not present

## 2021-02-10 DIAGNOSIS — C383 Malignant neoplasm of mediastinum, part unspecified: Secondary | ICD-10-CM | POA: Diagnosis not present

## 2021-02-10 DIAGNOSIS — C799 Secondary malignant neoplasm of unspecified site: Secondary | ICD-10-CM | POA: Diagnosis not present

## 2021-02-10 DIAGNOSIS — C3410 Malignant neoplasm of upper lobe, unspecified bronchus or lung: Secondary | ICD-10-CM

## 2021-02-10 DIAGNOSIS — Z5111 Encounter for antineoplastic chemotherapy: Secondary | ICD-10-CM | POA: Diagnosis not present

## 2021-02-10 MED ORDER — FAMOTIDINE 20 MG IN NS 100 ML IVPB
20.0000 mg | Freq: Once | INTRAVENOUS | Status: AC
  Administered 2021-02-10: 20 mg via INTRAVENOUS
  Filled 2021-02-10: qty 100

## 2021-02-10 MED ORDER — SODIUM CHLORIDE 0.9 % IV SOLN
10.0000 mg | Freq: Once | INTRAVENOUS | Status: AC
  Administered 2021-02-10: 10 mg via INTRAVENOUS
  Filled 2021-02-10: qty 10

## 2021-02-10 MED ORDER — HEPARIN SOD (PORK) LOCK FLUSH 100 UNIT/ML IV SOLN
500.0000 [IU] | Freq: Once | INTRAVENOUS | Status: AC | PRN
  Administered 2021-02-10: 500 [IU]
  Filled 2021-02-10: qty 5

## 2021-02-10 MED ORDER — SODIUM CHLORIDE 0.9 % IV SOLN
36.0000 mg/m2 | Freq: Once | INTRAVENOUS | Status: AC
  Administered 2021-02-10: 66 mg via INTRAVENOUS
  Filled 2021-02-10: qty 11

## 2021-02-10 MED ORDER — DIPHENHYDRAMINE HCL 50 MG/ML IJ SOLN
INTRAMUSCULAR | Status: AC
  Filled 2021-02-10: qty 1

## 2021-02-10 MED ORDER — SODIUM CHLORIDE 0.9 % IV SOLN
150.2000 mg | Freq: Once | INTRAVENOUS | Status: AC
  Administered 2021-02-10: 150 mg via INTRAVENOUS
  Filled 2021-02-10: qty 15

## 2021-02-10 MED ORDER — DIPHENHYDRAMINE HCL 50 MG/ML IJ SOLN
50.0000 mg | Freq: Once | INTRAMUSCULAR | Status: AC
  Administered 2021-02-10: 50 mg via INTRAVENOUS

## 2021-02-10 MED ORDER — SODIUM CHLORIDE 0.9 % IV SOLN
Freq: Once | INTRAVENOUS | Status: AC
  Filled 2021-02-10: qty 250

## 2021-02-10 MED ORDER — PALONOSETRON HCL INJECTION 0.25 MG/5ML
INTRAVENOUS | Status: AC
  Filled 2021-02-10: qty 5

## 2021-02-10 MED ORDER — PALONOSETRON HCL INJECTION 0.25 MG/5ML
0.2500 mg | Freq: Once | INTRAVENOUS | Status: AC
  Administered 2021-02-10: 0.25 mg via INTRAVENOUS

## 2021-02-10 NOTE — Patient Instructions (Signed)
[facility]  Discharge Instructions: Thank you for choosing [facility] to provide your oncology and hematology care.  If you have a lab appointment with the [facility], please go directly to the [facility] and check in at the registration area.   Wear comfortable clothing and clothing appropriate for easy access to any Portacath or PICC line.   We strive to give you quality time with your provider. You may need to reschedule your appointment if you arrive late (15 or more minutes).  Arriving late affects you and other patients whose appointments are after yours.  Also, if you miss three or more appointments without notifying the office, you may be dismissed from the clinic at the provider's discretion.      For prescription refill requests, have your pharmacy contact our office and allow 72 hours for refills to be completed.    Today you received the following chemotherapy and/or immunotherapy agents taxol/carbo    To help prevent nausea and vomiting after your treatment, we encourage you to take your nausea medication as directed.  BELOW ARE SYMPTOMS THAT SHOULD BE REPORTED IMMEDIATELY: . *FEVER GREATER THAN 100.4 F (38 C) OR HIGHER . *CHILLS OR SWEATING . *NAUSEA AND VOMITING THAT IS NOT CONTROLLED WITH YOUR NAUSEA MEDICATION . *UNUSUAL SHORTNESS OF BREATH . *UNUSUAL BRUISING OR BLEEDING . *URINARY PROBLEMS (pain or burning when urinating, or frequent urination) . *BOWEL PROBLEMS (unusual diarrhea, constipation, pain near the anus) . TENDERNESS IN MOUTH AND THROAT WITH OR WITHOUT PRESENCE OF ULCERS (sore throat, sores in mouth, or a toothache) . UNUSUAL RASH, SWELLING OR PAIN  . UNUSUAL VAGINAL DISCHARGE OR ITCHING   Items with * indicate a potential emergency and should be followed up as soon as possible or go to the Emergency Department if any problems should occur.  Please show the CHEMOTHERAPY ALERT CARD or IMMUNOTHERAPY ALERT CARD at  check-in to the Emergency Department and triage nurse.  Should you have questions after your visit or need to cancel or reschedule your appointment, please contact [facility]  Dept: [phone]  and follow the prompts.  Office hours are 8:00 a.m. to 4:30 p.m. Monday - Friday. Please note that voicemails left after 4:00 p.m. may not be returned until the following business day.  We are closed weekends and major holidays. You have access to a nurse at all times for urgent questions. Please call the main number to the clinic Dept: [phone] and follow the prompts.  For any non-urgent questions, you may also contact your provider using MyChart. We now offer e-Visits for anyone 36 and older to request care online for non-urgent symptoms. For details visit [email or web address].   Also download the MyChart app! Go to the app store, search "MyChart", open the app, select [facility], and log in with your MyChart username and password.  Due to Covid, a mask is required upon entering the hospital/clinic. If you do not have a mask, one will be given to you upon arrival. For doctor visits, patients may have 1 support person aged 27 or older with them. For treatment visits, patients cannot have anyone with them due to current Covid guidelines and our immunocompromised population.   Carboplatin injection What is this medicine? CARBOPLATIN (KAR boe pla tin) is a chemotherapy drug. It targets fast dividing cells, like cancer cells, and causes these cells to die. This medicine is used to treat ovarian cancer and many other cancers. This medicine  may be used for other purposes; ask your health care provider or pharmacist if you have questions. COMMON BRAND NAME(S): Paraplatin What should I tell my health care provider before I take this medicine? They need to know if you have any of these conditions:  blood disorders  hearing problems  kidney disease  recent or ongoing  radiation therapy  an unusual or allergic reaction to carboplatin, cisplatin, other chemotherapy, other medicines, foods, dyes, or preservatives  pregnant or trying to get pregnant  breast-feeding How should I use this medicine? This drug is usually given as an infusion into a vein. It is administered in a hospital or clinic by a specially trained health care professional. Talk to your pediatrician regarding the use of this medicine in children. Special care may be needed. Overdosage: If you think you have taken too much of this medicine contact a poison control center or emergency room at once. NOTE: This medicine is only for you. Do not share this medicine with others. What if I miss a dose? It is important not to miss a dose. Call your doctor or health care professional if you are unable to keep an appointment. What may interact with this medicine?  medicines for seizures  medicines to increase blood counts like filgrastim, pegfilgrastim, sargramostim  some antibiotics like amikacin, gentamicin, neomycin, streptomycin, tobramycin  vaccines Talk to your doctor or health care professional before taking any of these medicines:  acetaminophen  aspirin  ibuprofen  ketoprofen  naproxen This list may not describe all possible interactions. Give your health care provider a list of all the medicines, herbs, non-prescription drugs, or dietary supplements you use. Also tell them if you smoke, drink alcohol, or use illegal drugs. Some items may interact with your medicine. What should I watch for while using this medicine? Your condition will be monitored carefully while you are receiving this medicine. You will need important blood work done while you are taking this medicine. This drug may make you feel generally unwell. This is not uncommon, as chemotherapy can affect healthy cells as well as cancer cells. Report any side effects. Continue your course of treatment even though you feel  ill unless your doctor tells you to stop. In some cases, you may be given additional medicines to help with side effects. Follow all directions for their use. Call your doctor or health care professional for advice if you get a fever, chills or sore throat, or other symptoms of a cold or flu. Do not treat yourself. This drug decreases your body's ability to fight infections. Try to avoid being around people who are sick. This medicine may increase your risk to bruise or bleed. Call your doctor or health care professional if you notice any unusual bleeding. Be careful brushing and flossing your teeth or using a toothpick because you may get an infection or bleed more easily. If you have any dental work done, tell your dentist you are receiving this medicine. Avoid taking products that contain aspirin, acetaminophen, ibuprofen, naproxen, or ketoprofen unless instructed by your doctor. These medicines may hide a fever. Do not become pregnant while taking this medicine. Women should inform their doctor if they wish to become pregnant or think they might be pregnant. There is a potential for serious side effects to an unborn child. Talk to your health care professional or pharmacist for more information. Do not breast-feed an infant while taking this medicine. What side effects may I notice from receiving this medicine? Side effects that  you should report to your doctor or health care professional as soon as possible:  allergic reactions like skin rash, itching or hives, swelling of the face, lips, or tongue  signs of infection - fever or chills, cough, sore throat, pain or difficulty passing urine  signs of decreased platelets or bleeding - bruising, pinpoint red spots on the skin, black, tarry stools, nosebleeds  signs of decreased red blood cells - unusually weak or tired, fainting spells, lightheadedness  breathing problems  changes in hearing  changes in vision  chest pain  high blood  pressure  low blood counts - This drug may decrease the number of white blood cells, red blood cells and platelets. You may be at increased risk for infections and bleeding.  nausea and vomiting  pain, swelling, redness or irritation at the injection site  pain, tingling, numbness in the hands or feet  problems with balance, talking, walking  trouble passing urine or change in the amount of urine Side effects that usually do not require medical attention (report to your doctor or health care professional if they continue or are bothersome):  hair loss  loss of appetite  metallic taste in the mouth or changes in taste This list may not describe all possible side effects. Call your doctor for medical advice about side effects. You may report side effects to FDA at 1-800-FDA-1088. Where should I keep my medicine? This drug is given in a hospital or clinic and will not be stored at home. NOTE: This sheet is a summary. It may not cover all possible information. If you have questions about this medicine, talk to your doctor, pharmacist, or health care provider.  2021 Elsevier/Gold Standard (2007-12-05 14:38:05) Paclitaxel injection What is this medicine? PACLITAXEL (PAK li TAX el) is a chemotherapy drug. It targets fast dividing cells, like cancer cells, and causes these cells to die. This medicine is used to treat ovarian cancer, breast cancer, lung cancer, Kaposi's sarcoma, and other cancers. This medicine may be used for other purposes; ask your health care provider or pharmacist if you have questions. COMMON BRAND NAME(S): Onxol, Taxol What should I tell my health care provider before I take this medicine? They need to know if you have any of these conditions:  history of irregular heartbeat  liver disease  low blood counts, like low white cell, platelet, or red cell counts  lung or breathing disease, like asthma  tingling of the fingers or toes, or other nerve disorder  an  unusual or allergic reaction to paclitaxel, alcohol, polyoxyethylated castor oil, other chemotherapy, other medicines, foods, dyes, or preservatives  pregnant or trying to get pregnant  breast-feeding How should I use this medicine? This drug is given as an infusion into a vein. It is administered in a hospital or clinic by a specially trained health care professional. Talk to your pediatrician regarding the use of this medicine in children. Special care may be needed. Overdosage: If you think you have taken too much of this medicine contact a poison control center or emergency room at once. NOTE: This medicine is only for you. Do not share this medicine with others. What if I miss a dose? It is important not to miss your dose. Call your doctor or health care professional if you are unable to keep an appointment. What may interact with this medicine? Do not take this medicine with any of the following medications:  live virus vaccines This medicine may also interact with the following medications:  antiviral medicines for hepatitis, HIV or AIDS  certain antibiotics like erythromycin and clarithromycin  certain medicines for fungal infections like ketoconazole and itraconazole  certain medicines for seizures like carbamazepine, phenobarbital, phenytoin  gemfibrozil  nefazodone  rifampin  St. John's wort This list may not describe all possible interactions. Give your health care provider a list of all the medicines, herbs, non-prescription drugs, or dietary supplements you use. Also tell them if you smoke, drink alcohol, or use illegal drugs. Some items may interact with your medicine. What should I watch for while using this medicine? Your condition will be monitored carefully while you are receiving this medicine. You will need important blood work done while you are taking this medicine. This medicine can cause serious allergic reactions. To reduce your risk you will need to take  other medicine(s) before treatment with this medicine. If you experience allergic reactions like skin rash, itching or hives, swelling of the face, lips, or tongue, tell your doctor or health care professional right away. In some cases, you may be given additional medicines to help with side effects. Follow all directions for their use. This drug may make you feel generally unwell. This is not uncommon, as chemotherapy can affect healthy cells as well as cancer cells. Report any side effects. Continue your course of treatment even though you feel ill unless your doctor tells you to stop. Call your doctor or health care professional for advice if you get a fever, chills or sore throat, or other symptoms of a cold or flu. Do not treat yourself. This drug decreases your body's ability to fight infections. Try to avoid being around people who are sick. This medicine may increase your risk to bruise or bleed. Call your doctor or health care professional if you notice any unusual bleeding. Be careful brushing and flossing your teeth or using a toothpick because you may get an infection or bleed more easily. If you have any dental work done, tell your dentist you are receiving this medicine. Avoid taking products that contain aspirin, acetaminophen, ibuprofen, naproxen, or ketoprofen unless instructed by your doctor. These medicines may hide a fever. Do not become pregnant while taking this medicine. Women should inform their doctor if they wish to become pregnant or think they might be pregnant. There is a potential for serious side effects to an unborn child. Talk to your health care professional or pharmacist for more information. Do not breast-feed an infant while taking this medicine. Men are advised not to father a child while receiving this medicine. This product may contain alcohol. Ask your pharmacist or healthcare provider if this medicine contains alcohol. Be sure to tell all healthcare providers you  are taking this medicine. Certain medicines, like metronidazole and disulfiram, can cause an unpleasant reaction when taken with alcohol. The reaction includes flushing, headache, nausea, vomiting, sweating, and increased thirst. The reaction can last from 30 minutes to several hours. What side effects may I notice from receiving this medicine? Side effects that you should report to your doctor or health care professional as soon as possible:  allergic reactions like skin rash, itching or hives, swelling of the face, lips, or tongue  breathing problems  changes in vision  fast, irregular heartbeat  high or low blood pressure  mouth sores  pain, tingling, numbness in the hands or feet  signs of decreased platelets or bleeding - bruising, pinpoint red spots on the skin, black, tarry stools, blood in the urine  signs of decreased red  blood cells - unusually weak or tired, feeling faint or lightheaded, falls  signs of infection - fever or chills, cough, sore throat, pain or difficulty passing urine  signs and symptoms of liver injury like dark yellow or brown urine; general ill feeling or flu-like symptoms; light-colored stools; loss of appetite; nausea; right upper belly pain; unusually weak or tired; yellowing of the eyes or skin  swelling of the ankles, feet, hands  unusually slow heartbeat Side effects that usually do not require medical attention (report to your doctor or health care professional if they continue or are bothersome):  diarrhea  hair loss  loss of appetite  muscle or joint pain  nausea, vomiting  pain, redness, or irritation at site where injected  tiredness This list may not describe all possible side effects. Call your doctor for medical advice about side effects. You may report side effects to FDA at 1-800-FDA-1088. Where should I keep my medicine? This drug is given in a hospital or clinic and will not be stored at home. NOTE: This sheet is a  summary. It may not cover all possible information. If you have questions about this medicine, talk to your doctor, pharmacist, or health care provider.  2021 Elsevier/Gold Standard (2019-08-01 13:37:23)

## 2021-02-10 NOTE — Progress Notes (Signed)
Pt d/c stable at 1550

## 2021-02-11 ENCOUNTER — Other Ambulatory Visit: Payer: Self-pay

## 2021-02-11 DIAGNOSIS — F411 Generalized anxiety disorder: Secondary | ICD-10-CM

## 2021-02-11 DIAGNOSIS — Z510 Encounter for antineoplastic radiation therapy: Secondary | ICD-10-CM | POA: Diagnosis not present

## 2021-02-11 DIAGNOSIS — Z5111 Encounter for antineoplastic chemotherapy: Secondary | ICD-10-CM

## 2021-02-11 DIAGNOSIS — C383 Malignant neoplasm of mediastinum, part unspecified: Secondary | ICD-10-CM | POA: Diagnosis not present

## 2021-02-12 DIAGNOSIS — Z7984 Long term (current) use of oral hypoglycemic drugs: Secondary | ICD-10-CM | POA: Diagnosis not present

## 2021-02-12 DIAGNOSIS — E119 Type 2 diabetes mellitus without complications: Secondary | ICD-10-CM | POA: Diagnosis not present

## 2021-02-12 DIAGNOSIS — C383 Malignant neoplasm of mediastinum, part unspecified: Secondary | ICD-10-CM | POA: Diagnosis not present

## 2021-02-12 DIAGNOSIS — B351 Tinea unguium: Secondary | ICD-10-CM | POA: Diagnosis not present

## 2021-02-12 DIAGNOSIS — Z510 Encounter for antineoplastic radiation therapy: Secondary | ICD-10-CM | POA: Diagnosis not present

## 2021-02-12 NOTE — Progress Notes (Signed)
[facility]  [address] [place],  [place]  [address] [phone]  Clinic Day:  02/16/2021  Referring physician: Ronita Hipps, MD  This document serves as a record of services personally performed by Dequincy Macarthur Critchley, MD. It was created on their behalf by [place] E, a trained medical scribe. The creation of this record is based on the scribe's personal observations and the provider's statements to them.  HISTORY OF PRESENT ILLNESS:  The patient is a 75 y.o. male  who was recently found to have mediastinal recurrence of previous lung adenocarcinoma.  He was therefore placed on concurrent chemoradiation, which consists of weekly carboplatin/paclitaxel in late May 2022.  He comes in today prior to beginning his 3rd week of therapy.  Unfortunately, since starting therapy, his weight and appetite have plummeted.  He has had more difficulty swallowing, which he attributes to his radiation.  Even when he swallows, nausea and dry heaves prevent him from getting the caloric intake he needs.   He denies having any respiratory symptoms which concern him for overt signs of disease progression.  PHYSICAL EXAM:  Blood pressure 121/62, pulse 71, temperature (!) 97.5 F (36.4 C), resp. rate 16, height 5\' 9"  (1.753 m), weight 130 lb (59 kg), SpO2 96 %. Wt Readings from Last 3 Encounters:  02/16/21 130 lb (59 kg)  02/10/21 134 lb (60.8 kg)  02/03/21 139 lb 0.6 oz (63.1 kg)   Body mass index is 19.2 kg/m. Performance status (ECOG): 1 Physical Exam Constitutional:      Appearance: Normal appearance. He is not ill-appearing.  HENT:     Mouth/Throat:     Mouth: Mucous membranes are moist.     Pharynx: Oropharynx is clear. No oropharyngeal exudate or posterior oropharyngeal erythema.  Cardiovascular:     Rate and Rhythm: Normal rate and regular rhythm.     Heart sounds: No murmur heard. No friction rub. No gallop.   Pulmonary:     Effort: Pulmonary effort is  normal. No respiratory distress.     Breath sounds: Normal breath sounds. No wheezing, rhonchi or rales.  Chest:  Breasts:     Right: No axillary adenopathy or supraclavicular adenopathy.     Left: No axillary adenopathy or supraclavicular adenopathy.    Abdominal:     General: Bowel sounds are normal. There is no distension.     Palpations: Abdomen is soft. There is no mass.     Tenderness: There is no abdominal tenderness.  Musculoskeletal:        General: No swelling.     Right lower leg: No edema.     Left lower leg: No edema.  Lymphadenopathy:     Cervical: No cervical adenopathy.     Upper Body:     Right upper body: No supraclavicular or axillary adenopathy.     Left upper body: No supraclavicular or axillary adenopathy.     Lower Body: No right inguinal adenopathy. No left inguinal adenopathy.  Skin:    General: Skin is warm.     Coloration: Skin is not jaundiced.     Findings: No lesion or rash.  Neurological:     General: No focal deficit present.     Mental Status: He is alert and oriented to person, place, and time. Mental status is at baseline.     Cranial Nerves: Cranial nerves are intact.  Psychiatric:        Mood and Affect: Mood normal.  Behavior: Behavior normal.        Thought Content: Thought content normal.   LABS:   Ref. Range 02/16/2021 00:00  Sodium Latest Ref Range: 137 - 147  131 (A)  Potassium Latest Ref Range: 3.4 - 5.3  3.3 (A)  Chloride Latest Ref Range: 99 - 108  92 (A)  CO2 Latest Ref Range: 13 - 22  32 (A)  Glucose Unknown 116  BUN Latest Ref Range: 4 - 21  31 (A)  Creatinine Latest Ref Range: 0.6 - 1.3  1.0  Calcium Latest Ref Range: 8.7 - 10.7  9.0  Alkaline Phosphatase Latest Ref Range: 25 - 125  105  Albumin Latest Ref Range: 3.5 - 5.0  3.6  AST Latest Ref Range: 14 - 40  22  ALT Latest Ref Range: 10 - 40  18  Bilirubin, Total Unknown 2.7  WBC Unknown 2.5  RBC Latest Ref Range: 3.87 - 5.11  4.12  Hemoglobin Latest Ref  Range: 13.5 - 17.5  13.4 (A)  HCT Latest Ref Range: 41 - 53  38 (A)  Platelets Latest Ref Range: 150 - 399  99 (A)  NEUT# Unknown 2.00    ASSESSMENT & PLAN:  A 75 y.o. male with hilar/bilateral mediastinal lymph node recurrence of his previous lung adenocarcinoma.  When evaluating this gentleman today, there has been a clear decline in his weight and baseline status.  If chemoradiation was continued without any breaks, I would be afraid he would be hospitalized or ultimately succumb from his therapy.  Based upon this I will give him a chemotherapy break for at least 2 weeks.  I expressed my concerns with the radiation oncology team, who will reassess him tomorrow.  Of note, this gentleman was very orthostatic per systolic blood pressure today.  I will arrange for him to receive 2L of normal saline today. He knows to use his Zofran and Compazine as needed.  If his nausea does not improve, olanzapine would be added.  Otherwise, I will see him back in 2 weeks to reassess his status.  The patient understands all the plans discussed today and is in agreement with them.   I, Rita Ohara, am acting as scribe for Marice Potter, MD    I have reviewed this report as typed by the medical scribe, and it is complete and accurate.  Dequincy Macarthur Critchley, MD

## 2021-02-13 ENCOUNTER — Encounter: Payer: Self-pay | Admitting: Oncology

## 2021-02-13 DIAGNOSIS — C383 Malignant neoplasm of mediastinum, part unspecified: Secondary | ICD-10-CM | POA: Diagnosis not present

## 2021-02-13 DIAGNOSIS — Z510 Encounter for antineoplastic radiation therapy: Secondary | ICD-10-CM | POA: Diagnosis not present

## 2021-02-13 MED ORDER — LORAZEPAM 1 MG PO TABS: 1.0000 mg | ORAL_TABLET | Freq: Three times a day (TID) | ORAL | 1 refills | Status: AC | PRN

## 2021-02-15 ENCOUNTER — Other Ambulatory Visit: Payer: Self-pay | Admitting: Oncology

## 2021-02-16 ENCOUNTER — Other Ambulatory Visit: Payer: Self-pay | Admitting: Pharmacist

## 2021-02-16 ENCOUNTER — Other Ambulatory Visit: Payer: Medicare Other

## 2021-02-16 ENCOUNTER — Other Ambulatory Visit: Payer: Self-pay | Admitting: Oncology

## 2021-02-16 ENCOUNTER — Inpatient Hospital Stay: Payer: Medicare Other

## 2021-02-16 ENCOUNTER — Other Ambulatory Visit: Payer: Self-pay

## 2021-02-16 ENCOUNTER — Encounter: Payer: Self-pay | Admitting: Oncology

## 2021-02-16 ENCOUNTER — Inpatient Hospital Stay: Payer: Medicare Other | Attending: Oncology | Admitting: Oncology

## 2021-02-16 VITALS — BP 114/75 | HR 78 | Temp 98.1°F | Resp 18 | Ht 69.0 in | Wt 130.0 lb

## 2021-02-16 VITALS — BP 91/56 | HR 75 | Temp 97.5°F | Resp 16 | Ht 69.0 in | Wt 130.0 lb

## 2021-02-16 DIAGNOSIS — C3490 Malignant neoplasm of unspecified part of unspecified bronchus or lung: Secondary | ICD-10-CM | POA: Insufficient documentation

## 2021-02-16 DIAGNOSIS — C3491 Malignant neoplasm of unspecified part of right bronchus or lung: Secondary | ICD-10-CM | POA: Diagnosis not present

## 2021-02-16 DIAGNOSIS — R112 Nausea with vomiting, unspecified: Secondary | ICD-10-CM | POA: Insufficient documentation

## 2021-02-16 DIAGNOSIS — I959 Hypotension, unspecified: Secondary | ICD-10-CM | POA: Diagnosis not present

## 2021-02-16 DIAGNOSIS — D649 Anemia, unspecified: Secondary | ICD-10-CM | POA: Diagnosis not present

## 2021-02-16 DIAGNOSIS — C3410 Malignant neoplasm of upper lobe, unspecified bronchus or lung: Secondary | ICD-10-CM

## 2021-02-16 DIAGNOSIS — E860 Dehydration: Secondary | ICD-10-CM

## 2021-02-16 DIAGNOSIS — Z510 Encounter for antineoplastic radiation therapy: Secondary | ICD-10-CM | POA: Diagnosis not present

## 2021-02-16 DIAGNOSIS — C383 Malignant neoplasm of mediastinum, part unspecified: Secondary | ICD-10-CM | POA: Diagnosis not present

## 2021-02-16 LAB — COMPREHENSIVE METABOLIC PANEL
Albumin: 3.6 (ref 3.5–5.0)
Calcium: 9 (ref 8.7–10.7)

## 2021-02-16 LAB — CBC AND DIFFERENTIAL
HCT: 38 — AB (ref 41–53)
Hemoglobin: 13.4 — AB (ref 13.5–17.5)
Neutrophils Absolute: 2
Platelets: 99 — AB (ref 150–399)
WBC: 2.5

## 2021-02-16 LAB — HEPATIC FUNCTION PANEL
ALT: 18 (ref 10–40)
AST: 22 (ref 14–40)
Alkaline Phosphatase: 105 (ref 25–125)
Bilirubin, Total: 2.7

## 2021-02-16 LAB — BASIC METABOLIC PANEL
BUN: 31 — AB (ref 4–21)
CO2: 32 — AB (ref 13–22)
Chloride: 92 — AB (ref 99–108)
Creatinine: 1 (ref 0.6–1.3)
Glucose: 116
Potassium: 3.3 — AB (ref 3.4–5.3)
Sodium: 131 — AB (ref 137–147)

## 2021-02-16 LAB — CBC: RBC: 4.12 (ref 3.87–5.11)

## 2021-02-16 MED ORDER — HEPARIN SOD (PORK) LOCK FLUSH 100 UNIT/ML IV SOLN
500.0000 [IU] | Freq: Once | INTRAVENOUS | Status: AC | PRN
  Administered 2021-02-16: 500 [IU]
  Filled 2021-02-16: qty 5

## 2021-02-16 MED ORDER — ONDANSETRON HCL 4 MG/2ML IJ SOLN
INTRAMUSCULAR | Status: AC
  Filled 2021-02-16: qty 4

## 2021-02-16 MED ORDER — SODIUM CHLORIDE 0.9 % IV SOLN
Freq: Once | INTRAVENOUS | Status: AC
  Filled 2021-02-16: qty 250

## 2021-02-16 MED ORDER — ONDANSETRON HCL 4 MG/2ML IJ SOLN
8.0000 mg | Freq: Once | INTRAMUSCULAR | Status: AC
  Administered 2021-02-16: 8 mg via INTRAVENOUS

## 2021-02-16 MED FILL — Dexamethasone Sodium Phosphate Inj 100 MG/10ML: INTRAMUSCULAR | Qty: 1 | Status: AC

## 2021-02-16 NOTE — Patient Instructions (Signed)
Dehydration, Adult Dehydration is condition in which there is not enough water or other fluids in the body. This happens when a person loses more fluids than he or she takes in. Important body parts cannot work right without the right amount of fluids. Any loss of fluids from the body can cause dehydration. Dehydration can be mild, worse, or very bad. It should be treated right away to keep it from getting very bad. What are the causes? This condition may be caused by:  Conditions that cause loss of water or other fluids, such as: ? Watery poop (diarrhea). ? Vomiting. ? Sweating a lot. ? Peeing (urinating) a lot.  Not drinking enough fluids, especially when you: ? Are ill. ? Are doing things that take a lot of energy to do.  Other illnesses and conditions, such as fever or infection.  Certain medicines, such as medicines that take extra fluid out of the body (diuretics).  Lack of safe drinking water.  Not being able to get enough water and food. What increases the risk? The following factors may make you more likely to develop this condition:  Having a long-term (chronic) illness that has not been treated the right way, such as: ? Diabetes. ? Heart disease. ? Kidney disease.  Being 65 years of age or older.  Having a disability.  Living in a place that is high above the ground or sea (high in altitude). The thinner, dried air causes more fluid loss.  Doing exercises that put stress on your body for a long time. What are the signs or symptoms? Symptoms of dehydration depend on how bad it is. Mild or worse dehydration  Thirst.  Dry lips or dry mouth.  Feeling dizzy or light-headed, especially when you stand up from sitting.  Muscle cramps.  Your body making: ? Dark pee (urine). Pee may be the color of tea. ? Less pee than normal. ? Less tears than normal.  Headache. Very bad dehydration  Changes in skin. Skin may: ? Be cold to the touch (clammy). ? Be blotchy  or pale. ? Not go back to normal right after you lightly pinch it and let it go.  Little or no tears, pee, or sweat.  Changes in vital signs, such as: ? Fast breathing. ? Low blood pressure. ? Weak pulse. ? Pulse that is more than 100 beats a minute when you are sitting still.  Other changes, such as: ? Feeling very thirsty. ? Eyes that look hollow (sunken). ? Cold hands and feet. ? Being mixed up (confused). ? Being very tired (lethargic) or having trouble waking from sleep. ? Short-term weight loss. ? Loss of consciousness. How is this treated? Treatment for this condition depends on how bad it is. Treatment should start right away. Do not wait until your condition gets very bad. Very bad dehydration is an emergency. You will need to go to a hospital.  Mild or worse dehydration can be treated at home. You may be asked to: ? Drink more fluids. ? Drink an oral rehydration solution (ORS). This drink helps get the right amounts of fluids and salts and minerals in the blood (electrolytes).  Very bad dehydration can be treated: ? With fluids through an IV tube. ? By getting normal levels of salts and minerals in your blood. This is often done by giving salts and minerals through a tube. The tube is passed through your nose and into your stomach. ? By treating the root cause. Follow these instructions at   home: Oral rehydration solution If told by your doctor, drink an ORS:  Make an ORS. Use instructions on the package.  Start by drinking small amounts, about  cup (120 mL) every 5-10 minutes.  Slowly drink more until you have had the amount that your doctor said to have. Eating and drinking  Drink enough clear fluid to keep your pee pale yellow. If you were told to drink an ORS, finish the ORS first. Then, start slowly drinking other clear fluids. Drink fluids such as: ? Water. Do not drink only water. Doing that can make the salt (sodium) level in your body get too low. ? Water  from ice chips you suck on. ? Fruit juice that you have added water to (diluted). ? Low-calorie sports drinks.  Eat foods that have the right amounts of salts and minerals, such as: ? Bananas. ? Oranges. ? Potatoes. ? Tomatoes. ? Spinach.  Do not drink alcohol.  Avoid: ? Drinks that have a lot of sugar. These include:  High-calorie sports drinks.  Fruit juice that you did not add water to.  Soda.  Caffeine. ? Foods that are greasy or have a lot of fat or sugar.         General instructions  Take over-the-counter and prescription medicines only as told by your doctor.  Do not take salt tablets. Doing that can make the salt level in your body get too high.  Return to your normal activities as told by your doctor. Ask your doctor what activities are safe for you.  Keep all follow-up visits as told by your doctor. This is important. Contact a doctor if:  You have pain in your belly (abdomen) and the pain: ? Gets worse. ? Stays in one place.  You have a rash.  You have a stiff neck.  You get angry or annoyed (irritable) more easily than normal.  You are more tired or have a harder time waking than normal.  You feel: ? Weak or dizzy. ? Very thirsty. Get help right away if you have:  Any symptoms of very bad dehydration.  Symptoms of vomiting, such as: ? You cannot eat or drink without vomiting. ? Your vomiting gets worse or does not go away. ? Your vomit has blood or green stuff in it.  Symptoms that get worse with treatment.  A fever.  A very bad headache.  Problems with peeing or pooping (having a bowel movement), such as: ? Watery poop that gets worse or does not go away. ? Blood in your poop (stool). This may cause poop to look black and tarry. ? Not peeing in 6-8 hours. ? Peeing only a small amount of very dark pee in 6-8 hours.  Trouble breathing. These symptoms may be an emergency. Do not wait to see if the symptoms will go away. Get  medical help right away. Call your local emergency services (911 in the [place] Do not drive yourself to the hospital. Summary  Dehydration is a condition in which there is not enough water or other fluids in the body. This happens when a person loses more fluids than he or she takes in.  Treatment for this condition depends on how bad it is. Treatment should be started right away. Do not wait until your condition gets very bad.  Drink enough clear fluid to keep your pee pale yellow. If you were told to drink an oral rehydration solution (ORS), finish the ORS first. Then, start slowly drinking other clear fluids.    Take over-the-counter and prescription medicines only as told by your doctor.  Get help right away if you have any symptoms of very bad dehydration. This information is not intended to replace advice given to you by your health care provider. Make sure you discuss any questions you have with your health care provider. Document Revised: 04/12/2019 Document Reviewed: 04/12/2019 Elsevier Patient Education  2021 [employer]  

## 2021-02-17 ENCOUNTER — Other Ambulatory Visit: Payer: Self-pay | Admitting: Hematology and Oncology

## 2021-02-17 ENCOUNTER — Inpatient Hospital Stay: Payer: Medicare Other

## 2021-02-17 MED ORDER — OXYCODONE HCL 10 MG/0.5ML PO CONC: 10.0000 mg | ORAL | 0 refills | Status: DC | PRN

## 2021-02-18 ENCOUNTER — Other Ambulatory Visit: Payer: Self-pay | Admitting: Hematology and Oncology

## 2021-02-18 DIAGNOSIS — C3491 Malignant neoplasm of unspecified part of right bronchus or lung: Secondary | ICD-10-CM | POA: Diagnosis not present

## 2021-02-18 DIAGNOSIS — R42 Dizziness and giddiness: Secondary | ICD-10-CM | POA: Diagnosis not present

## 2021-02-18 DIAGNOSIS — H538 Other visual disturbances: Secondary | ICD-10-CM | POA: Diagnosis not present

## 2021-02-18 DIAGNOSIS — I672 Cerebral atherosclerosis: Secondary | ICD-10-CM | POA: Diagnosis not present

## 2021-02-18 DIAGNOSIS — I6523 Occlusion and stenosis of bilateral carotid arteries: Secondary | ICD-10-CM | POA: Diagnosis not present

## 2021-02-18 DIAGNOSIS — Z510 Encounter for antineoplastic radiation therapy: Secondary | ICD-10-CM | POA: Diagnosis not present

## 2021-02-18 DIAGNOSIS — C383 Malignant neoplasm of mediastinum, part unspecified: Secondary | ICD-10-CM | POA: Diagnosis not present

## 2021-02-18 MED ORDER — CHLORPROMAZINE HCL 10 MG PO TABS: 10.0000 mg | ORAL_TABLET | Freq: Three times a day (TID) | ORAL | 0 refills | Status: DC

## 2021-02-18 MED ORDER — MORPHINE SULFATE (CONCENTRATE) 10 MG/0.5ML PO SOLN: 10.0000 mg | ORAL | 0 refills | Status: DC | PRN

## 2021-02-19 ENCOUNTER — Other Ambulatory Visit: Payer: Self-pay | Admitting: Hematology and Oncology

## 2021-02-19 DIAGNOSIS — C3490 Malignant neoplasm of unspecified part of unspecified bronchus or lung: Secondary | ICD-10-CM | POA: Diagnosis not present

## 2021-02-19 MED ORDER — MORPHINE SULFATE (CONCENTRATE) 10 MG/0.5ML PO SOLN: 10.0000 mg | ORAL | 0 refills | Status: DC | PRN

## 2021-02-20 ENCOUNTER — Other Ambulatory Visit: Payer: Self-pay | Admitting: Hematology and Oncology

## 2021-02-20 ENCOUNTER — Telehealth: Payer: Self-pay

## 2021-02-20 MED ORDER — DIPHENOXYLATE-ATROPINE 2.5-0.025 MG PO TABS: 2.0000 | ORAL_TABLET | Freq: Four times a day (QID) | ORAL | 0 refills | Status: DC | PRN

## 2021-02-20 MED ORDER — DIPHENOXYLATE-ATROPINE 2.5-0.025 MG PO TABS: 2.0000 | ORAL_TABLET | Freq: Four times a day (QID) | ORAL | 0 refills | Status: AC | PRN

## 2021-02-20 NOTE — Telephone Encounter (Signed)
Pt is having several diarrhea episodes per day, some incontinence with this. His significant other reports he took 4 Imodium yesterday and has already taken 2 today.  I educated her on the appropriate dosing of Imodium up to 8 tabs per day. Pt should take 2 tabs after first diarrhea episode each day, then 1 tab after each diarrhea episode thereafter, up to 8 tabs.   I notified Melissa P,NP, of above. She is sending in Lomotil to be used as well.   @1124 - I called Vicente Males back to educate her on the importance of keeping Alexius hydrated during these diarrheal episodes. Pt should drink a cup of fluid for each diarrhea episode. I recommended Gatorade, Powerade, Pedialyte, water, tea, etc. Advised her to avoid milk products and fresh citrus fruits/juices. She verbalized understanding. Vicente Males thanked me for calling her back and acting so quickly to the call.

## 2021-02-20 NOTE — Progress Notes (Signed)
Lomotil 

## 2021-02-23 ENCOUNTER — Other Ambulatory Visit: Payer: Medicare Other

## 2021-02-23 DIAGNOSIS — Z510 Encounter for antineoplastic radiation therapy: Secondary | ICD-10-CM | POA: Diagnosis not present

## 2021-02-23 DIAGNOSIS — H524 Presbyopia: Secondary | ICD-10-CM | POA: Diagnosis not present

## 2021-02-23 DIAGNOSIS — H04123 Dry eye syndrome of bilateral lacrimal glands: Secondary | ICD-10-CM | POA: Diagnosis not present

## 2021-02-23 DIAGNOSIS — C383 Malignant neoplasm of mediastinum, part unspecified: Secondary | ICD-10-CM | POA: Diagnosis not present

## 2021-02-24 ENCOUNTER — Ambulatory Visit: Payer: Medicare Other

## 2021-02-24 DIAGNOSIS — C383 Malignant neoplasm of mediastinum, part unspecified: Secondary | ICD-10-CM | POA: Diagnosis not present

## 2021-02-24 DIAGNOSIS — Z510 Encounter for antineoplastic radiation therapy: Secondary | ICD-10-CM | POA: Diagnosis not present

## 2021-02-25 DIAGNOSIS — C383 Malignant neoplasm of mediastinum, part unspecified: Secondary | ICD-10-CM | POA: Diagnosis not present

## 2021-02-25 DIAGNOSIS — Z510 Encounter for antineoplastic radiation therapy: Secondary | ICD-10-CM | POA: Diagnosis not present

## 2021-02-26 DIAGNOSIS — C383 Malignant neoplasm of mediastinum, part unspecified: Secondary | ICD-10-CM | POA: Diagnosis not present

## 2021-02-26 DIAGNOSIS — Z510 Encounter for antineoplastic radiation therapy: Secondary | ICD-10-CM | POA: Diagnosis not present

## 2021-02-26 NOTE — Progress Notes (Signed)
[facility]  [address] [place],  [place]  [address] [phone]  Clinic Day:  03/02/2021  Referring physician: Ronita Hipps, MD  This document serves as a record of services personally performed by Dequincy Macarthur Critchley, MD. It was created on their behalf by [facility], a trained medical scribe. The creation of this record is based on the scribe's personal observations and the provider's statements to them.  HISTORY OF PRESENT ILLNESS:  The patient is a 75 y.o. male  who was recently found to have mediastinal recurrence of previous lung adenocarcinoma.  He was placed on concurrent chemoradiation.  However, due to a significant clinical decline in his health after 4 weeks of therapy, it has been held for the past 2 weeks.  He comes in today to reassess his clinical status before potentially proceeding with additional weeks of weekly carboplatin/paclitaxel.  Of note, he still has been taking his radiation on a daily basis.  The patient reports still having a poor appetite despite having an appetite stimulant.  He also has has had hiccups over the past few weeks.  He also complains of having a productive cough of yellow phlegm.    PHYSICAL EXAM:  Blood pressure 125/63, pulse 88, temperature 97.9 F (36.6 C), resp. rate 16, height 5\' 9"  (1.753 m), weight 131 lb 12.8 oz (59.8 kg), SpO2 94 %. Wt Readings from Last 3 Encounters:  03/02/21 131 lb 12.8 oz (59.8 kg)  02/16/21 130 lb 0.6 oz (59 kg)  02/16/21 130 lb (59 kg)   Body mass index is 19.46 kg/m. Performance status (ECOG): 1 Physical Exam Constitutional:      Appearance: Normal appearance. He is not ill-appearing.  HENT:     Mouth/Throat:     Mouth: Mucous membranes are moist.     Pharynx: Oropharynx is clear. No oropharyngeal exudate or posterior oropharyngeal erythema.  Cardiovascular:     Rate and Rhythm: Normal rate and regular rhythm.     Heart sounds: No murmur heard.   No friction rub.  No gallop.  Pulmonary:     Effort: Pulmonary effort is normal. No respiratory distress.     Breath sounds: Rhonchi present. No wheezing or rales.  Chest:  Breasts:    Right: No axillary adenopathy or supraclavicular adenopathy.     Left: No axillary adenopathy or supraclavicular adenopathy.  Abdominal:     General: Bowel sounds are normal. There is no distension.     Palpations: Abdomen is soft. There is no mass.     Tenderness: There is no abdominal tenderness.  Musculoskeletal:        General: No swelling.     Right lower leg: No edema.     Left lower leg: No edema.  Lymphadenopathy:     Cervical: No cervical adenopathy.     Upper Body:     Right upper body: No supraclavicular or axillary adenopathy.     Left upper body: No supraclavicular or axillary adenopathy.     Lower Body: No right inguinal adenopathy. No left inguinal adenopathy.  Skin:    General: Skin is warm.     Coloration: Skin is not jaundiced.     Findings: No lesion or rash.  Neurological:     General: No focal deficit present.     Mental Status: He is alert and oriented to person, place, and time. Mental status is at baseline.     Cranial Nerves: Cranial nerves are intact.  Psychiatric:  Mood and Affect: Mood normal.        Behavior: Behavior normal.        Thought Content: Thought content normal.   LABS:     ASSESSMENT & PLAN:  A 75 y.o. male with hilar/bilateral mediastinal lymph node recurrence of his previous lung adenocarcinoma.  When evaluating this patient, I remain very concerned with his baseline health and his ability to tolerate concurrent therapy.  I still do not believe he is strong enough to get back on chemotherapy.  For now, the plan will be for him to complete the radiation portion of his therapy.  My hopes are the 4 weeks of weekly carboplatin/paclitaxel he received will be enough for his disease containment.  With respect to his productive cough, I will prescribe him Levaquin 500 mg  daily x 5 days.  He will also be placed on a prednisone taper over 5 days to help with his breathing.  I will also prescribe him thorazine for his hiccups.  Otherwise I will see him back in mid July 2022 to see how he is doing as he heads into his last days of radiation.  The patient understands all the plans discussed today and is in agreement with them.   I, Rita Ohara, am acting as scribe for Marice Potter, MD    I have reviewed this report as typed by the medical scribe, and it is complete and accurate.  Dequincy Macarthur Critchley, MD

## 2021-02-27 ENCOUNTER — Encounter: Payer: Self-pay | Admitting: Oncology

## 2021-02-27 DIAGNOSIS — C383 Malignant neoplasm of mediastinum, part unspecified: Secondary | ICD-10-CM | POA: Diagnosis not present

## 2021-02-27 DIAGNOSIS — Z510 Encounter for antineoplastic radiation therapy: Secondary | ICD-10-CM | POA: Diagnosis not present

## 2021-03-02 ENCOUNTER — Inpatient Hospital Stay (INDEPENDENT_AMBULATORY_CARE_PROVIDER_SITE_OTHER): Payer: Medicare Other | Admitting: Oncology

## 2021-03-02 ENCOUNTER — Telehealth: Payer: Self-pay | Admitting: Cardiology

## 2021-03-02 ENCOUNTER — Other Ambulatory Visit: Payer: Self-pay | Admitting: Oncology

## 2021-03-02 ENCOUNTER — Other Ambulatory Visit: Payer: Self-pay

## 2021-03-02 ENCOUNTER — Encounter: Payer: Self-pay | Admitting: Oncology

## 2021-03-02 ENCOUNTER — Inpatient Hospital Stay: Payer: Medicare Other

## 2021-03-02 VITALS — BP 125/63 | HR 88 | Temp 97.9°F | Resp 16 | Ht 69.0 in | Wt 131.8 lb

## 2021-03-02 DIAGNOSIS — C3491 Malignant neoplasm of unspecified part of right bronchus or lung: Secondary | ICD-10-CM | POA: Diagnosis not present

## 2021-03-02 DIAGNOSIS — D649 Anemia, unspecified: Secondary | ICD-10-CM | POA: Diagnosis not present

## 2021-03-02 DIAGNOSIS — C3490 Malignant neoplasm of unspecified part of unspecified bronchus or lung: Secondary | ICD-10-CM

## 2021-03-02 DIAGNOSIS — C383 Malignant neoplasm of mediastinum, part unspecified: Secondary | ICD-10-CM | POA: Diagnosis not present

## 2021-03-02 DIAGNOSIS — Z510 Encounter for antineoplastic radiation therapy: Secondary | ICD-10-CM | POA: Diagnosis not present

## 2021-03-02 DIAGNOSIS — C3410 Malignant neoplasm of upper lobe, unspecified bronchus or lung: Secondary | ICD-10-CM

## 2021-03-02 LAB — BASIC METABOLIC PANEL
BUN: 23 — AB (ref 4–21)
CO2: 33 — AB (ref 13–22)
Chloride: 91 — AB (ref 99–108)
Creatinine: 1.1 (ref 0.6–1.3)
Glucose: 119
Potassium: 3.8 (ref 3.4–5.3)
Sodium: 133 — AB (ref 137–147)

## 2021-03-02 LAB — CBC AND DIFFERENTIAL
HCT: 38 — AB (ref 41–53)
Hemoglobin: 13.2 — AB (ref 13.5–17.5)
Neutrophils Absolute: 6.94
Platelets: 116 — AB (ref 150–399)
WBC: 7.8

## 2021-03-02 LAB — COMPREHENSIVE METABOLIC PANEL
Albumin: 3.7 (ref 3.5–5.0)
Calcium: 9.6 (ref 8.7–10.7)

## 2021-03-02 LAB — HEPATIC FUNCTION PANEL
ALT: 15 (ref 10–40)
AST: 20 (ref 14–40)
Alkaline Phosphatase: 154 — AB (ref 25–125)
Bilirubin, Total: 1.9

## 2021-03-02 LAB — CBC: RBC: 4.02 (ref 3.87–5.11)

## 2021-03-02 MED ORDER — CHLORPROMAZINE HCL 10 MG PO TABS
10.0000 mg | ORAL_TABLET | Freq: Three times a day (TID) | ORAL | 1 refills | Status: DC | PRN
Start: 2021-03-02 — End: 2021-03-03

## 2021-03-02 MED ORDER — PREDNISONE 10 MG PO TABS
ORAL_TABLET | ORAL | 0 refills | Status: DC
Start: 2021-03-02 — End: 2021-03-03

## 2021-03-02 MED ORDER — LEVOFLOXACIN 500 MG PO TABS: 500.0000 mg | ORAL_TABLET | Freq: Every day | ORAL | 0 refills | Status: DC

## 2021-03-02 MED FILL — Dexamethasone Sodium Phosphate Inj 100 MG/10ML: INTRAMUSCULAR | Qty: 1 | Status: AC

## 2021-03-02 MED FILL — Famotidine in NaCl 0.9% IV Soln 20 MG/50ML: INTRAVENOUS | Qty: 100 | Status: AC

## 2021-03-02 NOTE — Telephone Encounter (Signed)
Pt c/o medication issue:  1. Name of Medication: metolazone (ZAROXOLYN) 2.5 MG tablet  2. How are you currently taking this medication (dosage and times per day)? One tablet daily as directed   3. Are you having a reaction (difficulty breathing--STAT)?   4. What is your medication issue? Wife is concerned that the medication is not doing anything for the swelling   Patient is scheduled to see Dr. Geraldo Pitter tomorrow (03/03/21) at 4:20 pm

## 2021-03-02 NOTE — Telephone Encounter (Signed)
Patient calling the office for samples of medication:   1.  What medication and dosage are you requesting samples for? apixaban (ELIQUIS) 5 MG TABS tablet   2.  Are you currently out of this medication? No   Patient is coming in to the office tomorrow (03/03/21) and would like to pick up the samples tomorrow

## 2021-03-02 NOTE — Progress Notes (Signed)
No chemo for the final 2 weeks of radiation per Dr. Bobby Rumpf.  Pt is too frail to continue chemotherapy.

## 2021-03-03 ENCOUNTER — Other Ambulatory Visit: Payer: Self-pay

## 2021-03-03 ENCOUNTER — Ambulatory Visit (INDEPENDENT_AMBULATORY_CARE_PROVIDER_SITE_OTHER): Payer: Medicare Other | Admitting: Cardiology

## 2021-03-03 ENCOUNTER — Inpatient Hospital Stay: Payer: Medicare Other

## 2021-03-03 ENCOUNTER — Encounter: Payer: Self-pay | Admitting: Cardiology

## 2021-03-03 VITALS — BP 98/50 | HR 74 | Ht 69.0 in | Wt 132.0 lb

## 2021-03-03 DIAGNOSIS — Z5111 Encounter for antineoplastic chemotherapy: Secondary | ICD-10-CM

## 2021-03-03 DIAGNOSIS — E782 Mixed hyperlipidemia: Secondary | ICD-10-CM | POA: Diagnosis not present

## 2021-03-03 DIAGNOSIS — I10 Essential (primary) hypertension: Secondary | ICD-10-CM

## 2021-03-03 DIAGNOSIS — Z510 Encounter for antineoplastic radiation therapy: Secondary | ICD-10-CM | POA: Diagnosis not present

## 2021-03-03 DIAGNOSIS — I25119 Atherosclerotic heart disease of native coronary artery with unspecified angina pectoris: Secondary | ICD-10-CM | POA: Diagnosis not present

## 2021-03-03 DIAGNOSIS — I4821 Permanent atrial fibrillation: Secondary | ICD-10-CM

## 2021-03-03 DIAGNOSIS — C383 Malignant neoplasm of mediastinum, part unspecified: Secondary | ICD-10-CM | POA: Diagnosis not present

## 2021-03-03 DIAGNOSIS — R630 Anorexia: Secondary | ICD-10-CM

## 2021-03-03 DIAGNOSIS — C3491 Malignant neoplasm of unspecified part of right bronchus or lung: Secondary | ICD-10-CM

## 2021-03-03 MED ORDER — DRONABINOL 10 MG PO CAPS: 10.0000 mg | ORAL_CAPSULE | Freq: Two times a day (BID) | ORAL | 0 refills | Status: AC

## 2021-03-03 NOTE — Patient Instructions (Signed)

## 2021-03-03 NOTE — Progress Notes (Signed)
Cardiology Office Note:    Date:  03/03/2021   ID:  LEILAND MIHELICH, DOB [birth year], MRN [id number]  PCP:  Ronita Hipps, MD  Cardiologist:  Jenean Lindau, MD   Referring MD: Ronita Hipps, MD    ASSESSMENT:    No diagnosis found. PLAN:    In order of problems listed above:  Coronary artery disease: Secondary prevention stressed with the patient.  Importance of compliance with diet medication stressed any vocalized understanding. Atrial fibrillation:I discussed with the patient atrial fibrillation, disease process. Management and therapy including rate and rhythm control, anticoagulation benefits and potential risks were discussed extensively with the patient. Patient had multiple questions which were answered to patient's satisfaction. Essential hypertension: Blood pressure stable and diet was emphasized.  His blood pressure is low but he is asymptomatic.  He is on diuretic because of significant pedal edema issues Mixed dyslipidemia: Diet emphasized.  Patient will continue current medications Lung cancer: Managed by specialist and patient is undergoing radiation therapy. Patient will be seen in follow-up appointment in 3 months or earlier if the patient has any concerns    Medication Adjustments/Labs and Tests Ordered: Current medicines are reviewed at length with the patient today.  Concerns regarding medicines are outlined above.  No orders of the defined types were placed in this encounter.  No orders of the defined types were placed in this encounter.    No chief complaint on file.    History of Present Illness:    Juan Valdez is a 75 y.o. male.  Patient has past medical history of atrial fibrillation, coronary artery disease, essential hypertension dyslipidemia and diabetes mellitus.  He denies any problems at this time and takes care of activities of daily living.  No chest pain orthopnea or PND.  At the time of my evaluation, the patient is alert awake  oriented and in no distress.  Past Medical History:  Diagnosis Date   Abdominal bloating 05/06/2019   Abdominal distension 05/06/2019   Abnormal nuclear cardiac imaging test 08/22/2019   Adenomatous duodenal polyp 05/06/2019   Allergic rhinitis 05/29/2019   Anemia    Atrial fibrillation ([place]) 09/07/2018   Bilateral recurrent inguinal hernia without obstruction or gangrene 05/06/2019   CAD (coronary artery disease)    a. s/p prior DES to RCA b. cath in 07/2019 showing 75% Prox to mid RCA stenosis, 5% ISR along previously placed distal RCA stent, 85% Prox LCx stenosis, and 100% CTO of distal LCx with filled from L--> L collaterals. Also had a long-diffuse 65-75% stenosis along proximal to mid-LAD. Unable to fully expand balloon for PCI of RCA and lesion reduced to 65%. Future atherectotmy of RCA and LCx.    CAD S/P percutaneous coronary angioplasty 07/20/2019   CHF (congestive heart failure) ([place]) 11/27/2019   Colon polyp    COPD (chronic obstructive pulmonary disease) (HCC)    COPD with acute exacerbation ([place]) 06/14/2019   Coronary artery disease involving native coronary artery of native heart with angina pectoris ([place]) 07/20/2019   Diabetes ([place])    Diabetes mellitus due to underlying condition with unspecified complications ([place]) 03/06/7627   Diverticulosis    DOE (dyspnea on exertion) 07/13/2019   Essential hypertension 09/07/2018   Excessive daytime sleepiness 06/09/2020   Former smoker 03/14/2019   Former smoker Quit 2015 171-pack-year smoking history 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   Generalized abdominal pain 05/06/2019   GERD (gastroesophageal reflux disease)  Gout    HLD (hyperlipidemia)    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   Hx of colonic polyps 05/06/2019   Irritable bowel syndrome with constipation 05/06/2019   Lung cancer ([place])    Lung nodule    Mediastinal lymphadenopathy 11/25/2020   Medication management  01/04/2019   Mixed dyslipidemia 09/07/2018   Non-small cell cancer of right lung ([place]) 11/23/2018   Onychomycosis due to dermatophyte 11/16/2018   Orthopnea 01/04/2019   Perforated bowel ([place])    Permanent atrial fibrillation ([place]) 09/07/2018   SBO (small bowel obstruction) ([place]) 05/08/2019   Shortness of breath 07/08/2019    Past Surgical History:  Procedure Laterality Date   ABDOMINAL SURGERY     BRONCHIAL BIOPSY  11/25/2020   Procedure: BRONCHIAL BIOPSIES;  Surgeon: Collene Gobble, MD;  Location: [facility] ENDOSCOPY;  Service: Pulmonary;;   BRONCHIAL BRUSHINGS  11/25/2020   Procedure: BRONCHIAL BRUSHINGS;  Surgeon: Collene Gobble, MD;  Location: [facility] ENDOSCOPY;  Service: Pulmonary;;   BRONCHIAL WASHINGS  11/25/2020   Procedure: BRONCHIAL WASHINGS;  Surgeon: Collene Gobble, MD;  Location: [facility] ENDOSCOPY;  Service: Pulmonary;;   CORONARY ANGIOPLASTY WITH STENT PLACEMENT  2003   CORONARY ATHERECTOMY N/A 08/22/2019   Procedure: CORONARY ATHERECTOMY;  Surgeon: Leonie Man, MD;  Location: [place] CV LAB;  Service: Cardiovascular;  Laterality: N/A;   CORONARY BALLOON ANGIOPLASTY N/A 07/20/2019   Procedure: CORONARY BALLOON ANGIOPLASTY;  Surgeon: Leonie Man, MD;  Location: [place] CV LAB;  Service: Cardiovascular;  Laterality: N/A;  RCA   CORONARY BALLOON ANGIOPLASTY N/A 08/22/2019   Procedure: CORONARY BALLOON ANGIOPLASTY;  Surgeon: Leonie Man, MD;  Location: [place] CV LAB;  Service: Cardiovascular;  Laterality: N/A;   CORONARY STENT INTERVENTION  08/22/2019   CORONARY STENT INTERVENTION N/A 08/22/2019   Procedure: CORONARY STENT INTERVENTION;  Surgeon: Leonie Man, MD;  Location: [place] CV LAB;  Service: Cardiovascular;  Laterality: N/A;   FINE NEEDLE ASPIRATION  11/25/2020   Procedure: FINE NEEDLE ASPIRATION (FNA) LINEAR;  Surgeon: Collene Gobble, MD;  Location: [facility] ENDOSCOPY;  Service: Pulmonary;;   LEFT HEART CATH AND CORONARY ANGIOGRAPHY N/A 07/20/2019   Procedure:  LEFT HEART CATH AND CORONARY ANGIOGRAPHY;  Surgeon: Leonie Man, MD;  Location: [place] CV LAB;  Service: Cardiovascular;  Laterality: N/A;   LEFT HEART CATH AND CORONARY ANGIOGRAPHY N/A 08/22/2019   Procedure: LEFT HEART CATH AND CORONARY ANGIOGRAPHY;  Surgeon: Leonie Man, MD;  Location: [place] CV LAB;  Service: Cardiovascular;  Laterality: N/A;   LUNG CANCER SURGERY     ROTATOR CUFF REPAIR Left    TEMPORARY PACEMAKER N/A 08/22/2019   Procedure: TEMPORARY PACEMAKER;  Surgeon: Leonie Man, MD;  Location: [place] CV LAB;  Service: Cardiovascular;  Laterality: N/A;   TONSILLECTOMY     age 27   VIDEO BRONCHOSCOPY WITH ENDOBRONCHIAL NAVIGATION N/A 11/25/2020   Procedure: VIDEO BRONCHOSCOPY WITH ENDOBRONCHIAL NAVIGATION;  Surgeon: Collene Gobble, MD;  Location: [place] ENDOSCOPY;  Service: Pulmonary;  Laterality: N/A;   VIDEO BRONCHOSCOPY WITH ENDOBRONCHIAL ULTRASOUND N/A 11/25/2020   Procedure: VIDEO BRONCHOSCOPY WITH ENDOBRONCHIAL ULTRASOUND;  Surgeon: Collene Gobble, MD;  Location: [facility] ENDOSCOPY;  Service: Pulmonary;  Laterality: N/A;    Current Medications: Current Meds  Medication Sig   ALAWAY 0.025 % ophthalmic solution Place 1 drop into both eyes 2 (two) times daily as needed for eye irritation (allergy/irritated eyes.).   albuterol (PROVENTIL) (2.5 MG/3ML) 0.083% nebulizer solution Take 3 mLs (  2.5 mg total) by nebulization every 6 (six) hours as needed for up to 4 doses for wheezing or shortness of breath.   allopurinol (ZYLOPRIM) 300 MG tablet Take 300 mg by mouth in the morning.   apixaban (ELIQUIS) 5 MG TABS tablet Take 5 mg by mouth 2 (two) times daily.   atorvastatin (LIPITOR) 40 MG tablet Take 40 mg by mouth every evening.    chlorproMAZINE (THORAZINE) 10 MG tablet Take 10 mg by mouth 3 (three) times daily.   Cholecalciferol (VITAMIN D3) 50 MCG (2000 UT) TABS Take 2,000 Units by mouth daily.   dexamethasone (DECADRON) 4 MG tablet Take 2 tabs the morning before  and 2 tab the morning of chemotherapy   dicyclomine (BENTYL) 20 MG tablet Take 1 tablet (20 mg total) by mouth every 8 (eight) hours as needed for spasms.   diphenoxylate-atropine (LOMOTIL) 2.5-0.025 MG tablet Take 2 tablets by mouth 4 (four) times daily as needed for diarrhea or loose stools.   dronabinol (MARINOL) 10 MG capsule Take 1 capsule (10 mg total) by mouth 2 (two) times daily before a meal.   fenoprofen (NALFON) 600 MG TABS tablet Take 600 mg by mouth 2 (two) times daily.   Fluticasone-Umeclidin-Vilant (TRELEGY ELLIPTA) 100-62.5-25 MCG/INH AEPB Inhale 1 puff into the lungs daily.   GAS RELIEF ULTRA STRENGTH 180 MG CAPS Take 180 mg by mouth in the morning and at bedtime. After breakfast & before bedtime   HOMEOPATHIC PRODUCTS EX Apply 1 application topically 2 (two) times daily as needed for pain (diabetic pain.). Topricin Foot Pain Relief Cream    ipratropium (ATROVENT) 0.03 % nasal spray Place 2 sprays into both nostrils every 12 (twelve) hours as needed for rhinitis.   levofloxacin (LEVAQUIN) 500 MG tablet Take 1 tablet (500 mg total) by mouth daily.   LORazepam (ATIVAN) 1 MG tablet Take 1 tablet (1 mg total) by mouth every 8 (eight) hours as needed for anxiety.   metolazone (ZAROXOLYN) 2.5 MG tablet Take 2.5 mg by mouth daily.   nitroGLYCERIN (NITROSTAT) 0.4 MG SL tablet Place 0.4 mg under the tongue every 5 (five) minutes as needed for chest pain.   Omega-3 Fatty Acids (FISH OIL) 1200 MG CAPS Take 1,200 mg by mouth every evening.   omeprazole (PRILOSEC) 20 MG capsule Take 20 mg by mouth 2 (two) times daily.   ondansetron (ZOFRAN) 4 MG tablet Take 1 tablet (4 mg total) by mouth every 4 (four) hours as needed for nausea.   potassium gluconate 595 (99 K) MG TABS tablet Take 595 mg by mouth every evening.   PROAIR HFA 108 (90 Base) MCG/ACT inhaler Inhale 2 puffs into the lungs every 2 (two) hours as needed for shortness of breath or wheezing.   prochlorperazine (COMPAZINE) 10 MG tablet  Take 1 tablet (10 mg total) by mouth every 6 (six) hours as needed for nausea or vomiting.   RESTASIS 0.05 % ophthalmic emulsion Place 1 drop into both eyes 2 (two) times daily as needed for dry eyes (dry eyes).   tamsulosin (FLOMAX) 0.4 MG CAPS capsule Take 0.4 mg by mouth in the morning and at bedtime.   traZODone (DESYREL) 150 MG tablet Take 150 mg by mouth at bedtime.    triamcinolone cream (KENALOG) 0.1 % Apply 1 application topically daily as needed for rash (rash/skin irritation).     Allergies:   Patient has no known allergies.   Social History   Socioeconomic History   Marital status: Widowed    Spouse  name: Not on file   Number of children: 0   Years of education: Not on file   Highest education level: Not on file  Occupational History   Occupation: retired  Tobacco Use   Smoking status: Former    Packs/day: 3.00    Years: 57.00    Pack years: 171.00    Types: Cigarettes    Start date: 1958    Quit date: 06/13/2014    Years since quitting: 6.7   Smokeless tobacco: Never  Vaping Use   Vaping Use: Never used  Substance and Sexual Activity   Alcohol use: Not Currently   Drug use: Never   Sexual activity: Not on file  Other Topics Concern   Not on file  Social History Narrative   Not on file   Social Determinants of Health   Financial Resource Strain: Not on file  Food Insecurity: Not on file  Transportation Needs: Not on file  Physical Activity: Not on file  Stress: Not on file  Social Connections: Not on file     Family History: The patient's family history is not on file. He was adopted.  ROS:   Please see the history of present illness.    All other systems reviewed and are negative.  EKGs/Labs/Other Studies Reviewed:    The following studies were reviewed today: IMPRESSIONS     1. Left ventricular ejection fraction, by estimation, is 55 to 60%. The  left ventricle has normal function. The left ventricle has no regional  wall motion  abnormalities. Left ventricular diastolic parameters are  consistent with Grade III diastolic  dysfunction (restrictive).   2. Left atrial size was moderately dilated.   3. The mitral valve is abnormal. Mild to moderate mitral valve  regurgitation. No evidence of mitral stenosis. Moderate mitral annular  calcification.   4. The aortic valve is normal in structure. Aortic valve regurgitation is  mild. Mild aortic valve sclerosis is present, with no evidence of aortic  valve stenosis.   5. There is mild dilatation of the ascending aorta, measuring 38 mm.     Leonie Man, MD (Primary)      Procedures  CORONARY BALLOON ANGIOPLASTY  CORONARY STENT INTERVENTION  CORONARY ATHERECTOMY  LEFT HEART CATH AND CORONARY ANGIOGRAPHY  TEMPORARY PACEMAKER    Conclusion    LESION #1: Prox RCA to Mid RCA lesion is 65-70% stenosed. Orbital atherectomy was performed on the entire segment. A drug-eluting stent was successfully placed covering the entire lesion, using a STENT RESOLUTE ONYX 4.0X38 -> this was postdilated with a 4.5 mm balloon to roughly 4.6 mm. Post intervention, there is a 0% residual stenosis. Previously placed Dist RCA stent is 5% stenosed. ---------------------------------------- Prox Cx lesion is 85% stenosed. Prox Cx to Mid Cx lesion is 70% stenosed (this lesion actually became identifiable with angioplasty of the ostial lesion) Scoring balloon angioplasty was performed using a BALLOON WOLVERINE 2.50X10 -followed by a 2.5 mm x 3mm [place] balloon Stent was not delivered due to inability to fully expand the balloon in the 70% lesion. Post intervention, there is a 45% residual stenosis in the original identified 85% lesion. Post intervention, there is a 50% residual stenosis in the newly identified 70% more calcified lesion.. -Downstream: Dist Cx lesion is 100% stenosed. 3rd Mrg lesion is 100% stenosed. ---------------------------------------- LV end diastolic pressure is  normal. There is no aortic valve stenosis.   SUMMARY Successful orbital atherectomy based PCI of the proximal to mid RCA-> Resolute Onyx DES 4.0  mm x 38 mm postdilated to 4.5 mm) Scoring balloon angioplasty only of proximal LCx (unable to advance wire adequately to perform atherectomy) -> reduced from 85% to 50%. Normal LVEDP Successful temporary pacemaker placement     RECOMMENDATIONS Overnight monitoring for post PCI care and sheath removal. We will restart Eliquis tonight He is on statin and beta-blocker.  Will adjust medicines accordingly. Provided he has improvement of symptoms, would just continue with PTCA only of the Circumflex as there was a suboptimal but fairly good result reducing 85% down to 50% stenosis.     He will follow up with Dr. Rose Phi, MD   Recent Labs: 03/02/2021: ALT 15; BUN 23; Creatinine 1.1; Hemoglobin 13.2; Platelets 116; Potassium 3.8; Sodium 133  Recent Lipid Panel    Component Value Date/Time   CHOL 130 06/20/2020 0804   TRIG 92 06/20/2020 0804   HDL 35 (L) 06/20/2020 0804   CHOLHDL 3.7 06/20/2020 0804   LDLCALC 77 06/20/2020 0804    Physical Exam:    VS:  BP (!) 98/50   Pulse 74   Ht 5\' 9"  (1.753 m)   Wt 132 lb (59.9 kg)   SpO2 95%   BMI 19.49 kg/m     Wt Readings from Last 3 Encounters:  03/03/21 132 lb (59.9 kg)  03/02/21 131 lb 12.8 oz (59.8 kg)  02/16/21 130 lb 0.6 oz (59 kg)     GEN: Patient is in no acute distress HEENT: Normal NECK: No JVD; No carotid bruits LYMPHATICS: No lymphadenopathy CARDIAC: Hear sounds regular, 2/6 systolic murmur at the apex. RESPIRATORY:  Clear to auscultation without rales, wheezing or rhonchi  ABDOMEN: Soft, non-tender, non-distended MUSCULOSKELETAL:  No edema; No deformity  SKIN: Warm and dry NEUROLOGIC:  Alert and oriented x 3 PSYCHIATRIC:  Normal affect   Signed, Jenean Lindau, MD  03/03/2021 1:51 PM    [facility]

## 2021-03-04 ENCOUNTER — Encounter: Payer: Self-pay | Admitting: Oncology

## 2021-03-04 DIAGNOSIS — C383 Malignant neoplasm of mediastinum, part unspecified: Secondary | ICD-10-CM | POA: Diagnosis not present

## 2021-03-04 DIAGNOSIS — Z510 Encounter for antineoplastic radiation therapy: Secondary | ICD-10-CM | POA: Diagnosis not present

## 2021-03-05 DIAGNOSIS — C383 Malignant neoplasm of mediastinum, part unspecified: Secondary | ICD-10-CM | POA: Diagnosis not present

## 2021-03-05 DIAGNOSIS — Z510 Encounter for antineoplastic radiation therapy: Secondary | ICD-10-CM | POA: Diagnosis not present

## 2021-03-06 DIAGNOSIS — C383 Malignant neoplasm of mediastinum, part unspecified: Secondary | ICD-10-CM | POA: Diagnosis not present

## 2021-03-06 DIAGNOSIS — Z510 Encounter for antineoplastic radiation therapy: Secondary | ICD-10-CM | POA: Diagnosis not present

## 2021-03-09 ENCOUNTER — Other Ambulatory Visit: Payer: Medicare Other

## 2021-03-10 ENCOUNTER — Ambulatory Visit: Payer: Medicare Other

## 2021-03-10 ENCOUNTER — Other Ambulatory Visit: Payer: Self-pay | Admitting: Hematology and Oncology

## 2021-03-10 DIAGNOSIS — C3410 Malignant neoplasm of upper lobe, unspecified bronchus or lung: Secondary | ICD-10-CM

## 2021-03-10 DIAGNOSIS — R1310 Dysphagia, unspecified: Secondary | ICD-10-CM | POA: Diagnosis not present

## 2021-03-10 NOTE — Progress Notes (Signed)
bariu

## 2021-03-11 ENCOUNTER — Ambulatory Visit: Payer: Medicare Other | Admitting: Cardiology

## 2021-03-11 ENCOUNTER — Telehealth: Payer: Self-pay | Admitting: Cardiology

## 2021-03-11 DIAGNOSIS — R1312 Dysphagia, oropharyngeal phase: Secondary | ICD-10-CM | POA: Diagnosis not present

## 2021-03-11 DIAGNOSIS — C383 Malignant neoplasm of mediastinum, part unspecified: Secondary | ICD-10-CM | POA: Diagnosis not present

## 2021-03-11 DIAGNOSIS — Z510 Encounter for antineoplastic radiation therapy: Secondary | ICD-10-CM | POA: Diagnosis not present

## 2021-03-11 NOTE — Telephone Encounter (Signed)
   [facility] Pre-operative Risk Assessment    Patient Name: Juan Valdez  DOB: 10-09-1945  MRN: [id number]   HEARTCARE STAFF: - Please ensure there is not already an duplicate clearance open for this procedure. - Under Visit Info/Reason for Call, type in Other and utilize the format Clearance MM/DD/YY or Clearance TBD. Do not use dashes or single digits. - If request is for dental extraction, please clarify the # of teeth to be extracted. - If the patient is currently at the dentist's office, call Pre-Op APP to address. If the patient is not currently in the dentist office, please route to the Pre-Op pool  Request for surgical clearance:  What type of surgery is being performed? Feeding tube  When is this surgery scheduled? Friday March 13, 2021  What type of clearance is required (medical clearance vs. Pharmacy clearance to hold med vs. Both)? Both   Are there any medications that need to be held prior to surgery and how long? Eliquis   Practice name and name of physician performing surgery? [facility]  What is the office phone number? [phone]   7.   What is the office fax number? [phone]  8.   Anesthesia type (None, local, MAC, general) ? Not sure     Shon Millet 03/11/2021, 11:11 AM  _________________________________________________________________   (provider comments below)

## 2021-03-11 NOTE — Telephone Encounter (Signed)
Can we clarify what type of tube is being placed?  NG tube vs PEG tube?

## 2021-03-11 NOTE — Telephone Encounter (Signed)
Pt needs a G-tube, possible open.   EGD with percutaneous G tube, possible laparotomy, possible open  Surgery is now 03/20/21

## 2021-03-12 ENCOUNTER — Telehealth: Payer: Self-pay | Admitting: Emergency Medicine

## 2021-03-12 DIAGNOSIS — D649 Anemia, unspecified: Secondary | ICD-10-CM | POA: Diagnosis not present

## 2021-03-12 DIAGNOSIS — I4891 Unspecified atrial fibrillation: Secondary | ICD-10-CM | POA: Diagnosis not present

## 2021-03-12 DIAGNOSIS — E119 Type 2 diabetes mellitus without complications: Secondary | ICD-10-CM | POA: Diagnosis not present

## 2021-03-12 NOTE — Telephone Encounter (Signed)
Patient with diagnosis of ATRIAL FIBRILLATION on ELIQUIS for anticoagulation.    Procedure: G TUBE PLACEMENT Date of procedure: 03/20/2021   CHA2DS2-VASc Score = 6  This indicates a 9.7% annual risk of stroke. The patient's score is based upon: CHF History: Yes HTN History: Yes Diabetes History: Yes Stroke History: No Vascular Disease History: Yes Age Score: 2 Gender Score: 0    CrCl = 48ML/MIN Platelet count = 116  Per office protocol, patient can hold ELIQUIS for 2 days prior to procedure. RESUME ELIQUIS AS SOON AS POSSIBLE AFTER PROCEDURE PER MD RECOMMENDATION.

## 2021-03-12 NOTE — Telephone Encounter (Signed)
Received fax from Dr. Melynda Ripple ont his patient to have surgery on 03/20/21. To have a feeding tube placed with propofol. Patient has not been seen since 11/21/20. Policy is to make him an appointment but there are no open slots prior to this surgical date.    Sending to Dr. Lamonte Sakai for further recommendations.

## 2021-03-12 NOTE — Telephone Encounter (Signed)
Dr. Clearence Ped You saw this patient on 6/21. He needs a G tube placed due to poor PO intake/cancer. Is he cleared for feeding tube placement?

## 2021-03-13 DIAGNOSIS — Z510 Encounter for antineoplastic radiation therapy: Secondary | ICD-10-CM | POA: Diagnosis not present

## 2021-03-13 DIAGNOSIS — C383 Malignant neoplasm of mediastinum, part unspecified: Secondary | ICD-10-CM | POA: Diagnosis not present

## 2021-03-13 NOTE — Telephone Encounter (Signed)
He is high risk for anesthesia, local would be preferred to general. There is no absolute contraindication to the procedure as long as the benefits outweigh the risks. I am ok writing a letter to this effect.

## 2021-03-13 NOTE — Telephone Encounter (Signed)
Will print OV notes and message from Dr. Lamonte Sakai. Will fax over to Dr. Gilman Buttner. Confirmation received that fax went through and was given to [place]. Nothing further needed at this time.

## 2021-03-17 NOTE — Telephone Encounter (Signed)
   Primary Cardiologist: Jenean Lindau, MD  Chart reviewed as part of pre-operative protocol coverage. Juan Valdez's medications were also reviewed by clinical pharmacist.    Patient with diagnosis of ATRIAL FIBRILLATION on ELIQUIS for anticoagulation.     Procedure: G TUBE PLACEMENT Date of procedure: 03/20/2021     CHA2DS2-VASc Score = 6  This indicates a 9.7% annual risk of stroke. The patient's score is based upon: CHF History: Yes HTN History: Yes Diabetes History: Yes Stroke History: No Vascular Disease History: Yes Age Score: 2 Gender Score: 0     CrCl = 48ML/MIN Platelet count = 116   Per office protocol, patient can hold ELIQUIS for 2 days prior to procedure. RESUME ELIQUIS AS SOON AS POSSIBLE AFTER PROCEDURE PER MD RECOMMENDATION.  Per Dr. Geraldo Pitter: "Typically we do not use a risk stratification for people who are so frail and have so many multiple comorbidities.  The procedure you are talking about is something that is mostly palliative care comfort measure so the family will have to accept the risk as there is not much of an alternative for this."  I will route this recommendation to the requesting party via [facility] fax function and remove from pre-op pool.  Please call with questions.  Juan Valdez. Juan Stuckey NP-C    03/17/2021, 7:52 AM [place] [address] Office [phone] Fax [phone]

## 2021-03-18 DIAGNOSIS — C383 Malignant neoplasm of mediastinum, part unspecified: Secondary | ICD-10-CM | POA: Diagnosis not present

## 2021-03-18 DIAGNOSIS — Z510 Encounter for antineoplastic radiation therapy: Secondary | ICD-10-CM | POA: Diagnosis not present

## 2021-03-19 DIAGNOSIS — Z902 Acquired absence of lung [part of]: Secondary | ICD-10-CM | POA: Diagnosis not present

## 2021-03-19 DIAGNOSIS — E119 Type 2 diabetes mellitus without complications: Secondary | ICD-10-CM | POA: Diagnosis not present

## 2021-03-19 DIAGNOSIS — I10 Essential (primary) hypertension: Secondary | ICD-10-CM | POA: Diagnosis not present

## 2021-03-19 DIAGNOSIS — J449 Chronic obstructive pulmonary disease, unspecified: Secondary | ICD-10-CM | POA: Diagnosis not present

## 2021-03-19 DIAGNOSIS — Z7901 Long term (current) use of anticoagulants: Secondary | ICD-10-CM | POA: Diagnosis not present

## 2021-03-19 DIAGNOSIS — Z7902 Long term (current) use of antithrombotics/antiplatelets: Secondary | ICD-10-CM | POA: Diagnosis not present

## 2021-03-19 DIAGNOSIS — C3490 Malignant neoplasm of unspecified part of unspecified bronchus or lung: Secondary | ICD-10-CM | POA: Diagnosis not present

## 2021-03-19 DIAGNOSIS — Z87891 Personal history of nicotine dependence: Secondary | ICD-10-CM | POA: Diagnosis not present

## 2021-03-19 DIAGNOSIS — R1312 Dysphagia, oropharyngeal phase: Secondary | ICD-10-CM | POA: Diagnosis not present

## 2021-03-19 DIAGNOSIS — Z7984 Long term (current) use of oral hypoglycemic drugs: Secondary | ICD-10-CM | POA: Diagnosis not present

## 2021-03-19 DIAGNOSIS — I252 Old myocardial infarction: Secondary | ICD-10-CM | POA: Diagnosis not present

## 2021-03-19 DIAGNOSIS — C3431 Malignant neoplasm of lower lobe, right bronchus or lung: Secondary | ICD-10-CM | POA: Diagnosis not present

## 2021-03-19 DIAGNOSIS — K219 Gastro-esophageal reflux disease without esophagitis: Secondary | ICD-10-CM | POA: Diagnosis not present

## 2021-03-20 DIAGNOSIS — Z431 Encounter for attention to gastrostomy: Secondary | ICD-10-CM | POA: Diagnosis not present

## 2021-03-20 DIAGNOSIS — C771 Secondary and unspecified malignant neoplasm of intrathoracic lymph nodes: Secondary | ICD-10-CM | POA: Diagnosis not present

## 2021-03-20 DIAGNOSIS — R627 Adult failure to thrive: Secondary | ICD-10-CM | POA: Diagnosis not present

## 2021-03-20 DIAGNOSIS — C3491 Malignant neoplasm of unspecified part of right bronchus or lung: Secondary | ICD-10-CM | POA: Diagnosis not present

## 2021-03-20 DIAGNOSIS — E119 Type 2 diabetes mellitus without complications: Secondary | ICD-10-CM | POA: Diagnosis not present

## 2021-03-20 DIAGNOSIS — R4702 Dysphasia: Secondary | ICD-10-CM | POA: Diagnosis not present

## 2021-03-20 DIAGNOSIS — J449 Chronic obstructive pulmonary disease, unspecified: Secondary | ICD-10-CM | POA: Diagnosis not present

## 2021-03-23 DIAGNOSIS — J449 Chronic obstructive pulmonary disease, unspecified: Secondary | ICD-10-CM | POA: Diagnosis not present

## 2021-03-23 DIAGNOSIS — R4702 Dysphasia: Secondary | ICD-10-CM | POA: Diagnosis not present

## 2021-03-23 DIAGNOSIS — Z431 Encounter for attention to gastrostomy: Secondary | ICD-10-CM | POA: Diagnosis not present

## 2021-03-23 DIAGNOSIS — E119 Type 2 diabetes mellitus without complications: Secondary | ICD-10-CM | POA: Diagnosis not present

## 2021-03-23 DIAGNOSIS — R627 Adult failure to thrive: Secondary | ICD-10-CM | POA: Diagnosis not present

## 2021-03-23 DIAGNOSIS — C383 Malignant neoplasm of mediastinum, part unspecified: Secondary | ICD-10-CM | POA: Diagnosis not present

## 2021-03-23 DIAGNOSIS — Z510 Encounter for antineoplastic radiation therapy: Secondary | ICD-10-CM | POA: Diagnosis not present

## 2021-03-23 DIAGNOSIS — C771 Secondary and unspecified malignant neoplasm of intrathoracic lymph nodes: Secondary | ICD-10-CM | POA: Diagnosis not present

## 2021-03-23 DIAGNOSIS — C3491 Malignant neoplasm of unspecified part of right bronchus or lung: Secondary | ICD-10-CM | POA: Diagnosis not present

## 2021-03-24 DIAGNOSIS — C383 Malignant neoplasm of mediastinum, part unspecified: Secondary | ICD-10-CM | POA: Diagnosis not present

## 2021-03-24 DIAGNOSIS — Z510 Encounter for antineoplastic radiation therapy: Secondary | ICD-10-CM | POA: Diagnosis not present

## 2021-03-25 DIAGNOSIS — C383 Malignant neoplasm of mediastinum, part unspecified: Secondary | ICD-10-CM | POA: Diagnosis not present

## 2021-03-25 DIAGNOSIS — Z510 Encounter for antineoplastic radiation therapy: Secondary | ICD-10-CM | POA: Diagnosis not present

## 2021-03-26 ENCOUNTER — Inpatient Hospital Stay: Payer: Medicare Other | Admitting: Oncology

## 2021-03-26 ENCOUNTER — Inpatient Hospital Stay: Payer: Medicare Other

## 2021-03-26 NOTE — Progress Notes (Signed)
[facility]  [address] [place],  [place]  [address] [phone]  Clinic Day:  03/30/2021  Referring physician: Ronita Hipps, MD  This document serves as a record of services personally performed by Ranie Chinchilla Macarthur Critchley, MD. It was created on their behalf by [facility] E, a trained medical scribe. The creation of this record is based on the scribe's personal observations and the provider's statements to them.  HISTORY OF PRESENT ILLNESS:  The patient is a 75 y.o. male  with mediastinal recurrence of previous lung adenocarcinoma.  Due to his failing health, his chemotherapy was discontinued early.  He has still been undergoing daily radiation, but has missed days due to his progressive weakness.  Despite having his chemotherapy stopped, his health has continued to fall. He is not getting adequate caloric intake, despite having a feeding tube.  Recently, he has become more short of breath and has been coughing profusely.  He comes in today feeling very poorly.  His significant other is concerned his health is abruptly declining to where she is becoming less confident with her ability to take care of him.     PHYSICAL EXAM:  Blood pressure 107/69, pulse 72, resp. rate 18, height 5\' 9"  (1.753 m), weight 110 lb 1.6 oz (49.9 kg), SpO2 97 %. Wt Readings from Last 3 Encounters:  03/30/21 110 lb 1.6 oz (49.9 kg)  03/03/21 132 lb (59.9 kg)  03/02/21 131 lb 12.8 oz (59.8 kg)   Body mass index is 16.26 kg/m. Performance status (ECOG): 1 Physical Exam Constitutional:      General: He is in acute distress.     Appearance: Normal appearance. He is ill-appearing and toxic-appearing.  HENT:     Mouth/Throat:     Mouth: Mucous membranes are moist.     Pharynx: Oropharynx is clear. No oropharyngeal exudate or posterior oropharyngeal erythema.  Cardiovascular:     Rate and Rhythm: Normal rate and regular rhythm.     Heart sounds: No murmur heard.   No friction  rub. No gallop.  Pulmonary:     Effort: No respiratory distress.     Breath sounds: Rhonchi (harsch rhonchi bilaterally) present. No wheezing or rales.  Chest:  Breasts:    Right: No axillary adenopathy or supraclavicular adenopathy.     Left: No axillary adenopathy or supraclavicular adenopathy.  Abdominal:     General: Bowel sounds are normal. There is no distension.     Palpations: Abdomen is soft. There is no mass.     Tenderness: There is no abdominal tenderness.  Musculoskeletal:        General: No swelling.     Right lower leg: No edema.     Left lower leg: No edema.  Lymphadenopathy:     Cervical: No cervical adenopathy.     Upper Body:     Right upper body: No supraclavicular or axillary adenopathy.     Left upper body: No supraclavicular or axillary adenopathy.     Lower Body: No right inguinal adenopathy. No left inguinal adenopathy.  Skin:    General: Skin is warm.     Coloration: Skin is not jaundiced.     Findings: No lesion or rash.  Neurological:     General: No focal deficit present.     Mental Status: He is alert and oriented to person, place, and time. Mental status is at baseline.     Cranial Nerves: Cranial nerves are intact.  Psychiatric:  Mood and Affect: Mood normal.        Behavior: Behavior normal.        Thought Content: Thought content normal.   LABS:     ASSESSMENT & PLAN:  A 75 y.o. male with hilar/bilateral mediastinal lymph node recurrence of his previous lung adenocarcinoma.  Unfortunately, this gentleman's health continues to fall.  He is acutely ill today to where he needs to go to the emergency room to ultimately be admitted.  He appears to be in acute respiratory failure.  Based upon his current health, I have a hard time believing this patient can be discharged back to home.  He ultimately may need nursing home placement to gradually build his health back up.  If his health does not improve over these next few weeks, I will have no  other choice but to bring in hospice for end-of-life care.  I will tentatively see him back in 1 month for repeat clinical assessment.  The patient understands all the plans discussed today and is in agreement with them.   I, Rita Ohara, am acting as scribe for Marice Potter, MD    I have reviewed this report as typed by the medical scribe, and it is complete and accurate.  Sabrinna Yearwood Macarthur Critchley, MD

## 2021-03-27 DIAGNOSIS — C771 Secondary and unspecified malignant neoplasm of intrathoracic lymph nodes: Secondary | ICD-10-CM | POA: Diagnosis not present

## 2021-03-27 DIAGNOSIS — R4702 Dysphasia: Secondary | ICD-10-CM | POA: Diagnosis not present

## 2021-03-27 DIAGNOSIS — C3491 Malignant neoplasm of unspecified part of right bronchus or lung: Secondary | ICD-10-CM | POA: Diagnosis not present

## 2021-03-27 DIAGNOSIS — E119 Type 2 diabetes mellitus without complications: Secondary | ICD-10-CM | POA: Diagnosis not present

## 2021-03-27 DIAGNOSIS — Z431 Encounter for attention to gastrostomy: Secondary | ICD-10-CM | POA: Diagnosis not present

## 2021-03-27 DIAGNOSIS — J449 Chronic obstructive pulmonary disease, unspecified: Secondary | ICD-10-CM | POA: Diagnosis not present

## 2021-03-27 DIAGNOSIS — R627 Adult failure to thrive: Secondary | ICD-10-CM | POA: Diagnosis not present

## 2021-03-28 ENCOUNTER — Other Ambulatory Visit: Payer: Self-pay | Admitting: Oncology

## 2021-03-28 DIAGNOSIS — E119 Type 2 diabetes mellitus without complications: Secondary | ICD-10-CM | POA: Diagnosis not present

## 2021-03-28 DIAGNOSIS — J449 Chronic obstructive pulmonary disease, unspecified: Secondary | ICD-10-CM | POA: Diagnosis not present

## 2021-03-28 DIAGNOSIS — Z431 Encounter for attention to gastrostomy: Secondary | ICD-10-CM | POA: Diagnosis not present

## 2021-03-28 DIAGNOSIS — C771 Secondary and unspecified malignant neoplasm of intrathoracic lymph nodes: Secondary | ICD-10-CM | POA: Diagnosis not present

## 2021-03-28 DIAGNOSIS — C3491 Malignant neoplasm of unspecified part of right bronchus or lung: Secondary | ICD-10-CM | POA: Diagnosis not present

## 2021-03-28 DIAGNOSIS — R627 Adult failure to thrive: Secondary | ICD-10-CM | POA: Diagnosis not present

## 2021-03-28 DIAGNOSIS — R4702 Dysphasia: Secondary | ICD-10-CM | POA: Diagnosis not present

## 2021-03-28 MED ORDER — MORPHINE SULFATE ER 15 MG PO TBCR: 15.0000 mg | EXTENDED_RELEASE_TABLET | Freq: Two times a day (BID) | ORAL | 0 refills | Status: AC

## 2021-03-30 ENCOUNTER — Inpatient Hospital Stay: Payer: Medicare Other | Attending: Oncology

## 2021-03-30 ENCOUNTER — Other Ambulatory Visit: Payer: Self-pay | Admitting: Hematology and Oncology

## 2021-03-30 ENCOUNTER — Inpatient Hospital Stay (INDEPENDENT_AMBULATORY_CARE_PROVIDER_SITE_OTHER): Payer: Medicare Other | Admitting: Oncology

## 2021-03-30 VITALS — BP 107/69 | HR 72 | Resp 18 | Ht 69.0 in | Wt 110.1 lb

## 2021-03-30 DIAGNOSIS — R531 Weakness: Secondary | ICD-10-CM | POA: Diagnosis not present

## 2021-03-30 DIAGNOSIS — R0600 Dyspnea, unspecified: Secondary | ICD-10-CM | POA: Diagnosis not present

## 2021-03-30 DIAGNOSIS — R6889 Other general symptoms and signs: Secondary | ICD-10-CM | POA: Diagnosis not present

## 2021-03-30 DIAGNOSIS — I2510 Atherosclerotic heart disease of native coronary artery without angina pectoris: Secondary | ICD-10-CM | POA: Diagnosis not present

## 2021-03-30 DIAGNOSIS — J441 Chronic obstructive pulmonary disease with (acute) exacerbation: Secondary | ICD-10-CM | POA: Diagnosis not present

## 2021-03-30 DIAGNOSIS — E46 Unspecified protein-calorie malnutrition: Secondary | ICD-10-CM | POA: Diagnosis not present

## 2021-03-30 DIAGNOSIS — R911 Solitary pulmonary nodule: Secondary | ICD-10-CM | POA: Diagnosis not present

## 2021-03-30 DIAGNOSIS — M109 Gout, unspecified: Secondary | ICD-10-CM | POA: Diagnosis not present

## 2021-03-30 DIAGNOSIS — C3410 Malignant neoplasm of upper lobe, unspecified bronchus or lung: Secondary | ICD-10-CM

## 2021-03-30 DIAGNOSIS — E876 Hypokalemia: Secondary | ICD-10-CM | POA: Diagnosis not present

## 2021-03-30 DIAGNOSIS — M1990 Unspecified osteoarthritis, unspecified site: Secondary | ICD-10-CM | POA: Diagnosis not present

## 2021-03-30 DIAGNOSIS — R1310 Dysphagia, unspecified: Secondary | ICD-10-CM | POA: Diagnosis not present

## 2021-03-30 DIAGNOSIS — R0602 Shortness of breath: Secondary | ICD-10-CM | POA: Diagnosis not present

## 2021-03-30 DIAGNOSIS — Z7901 Long term (current) use of anticoagulants: Secondary | ICD-10-CM | POA: Diagnosis not present

## 2021-03-30 DIAGNOSIS — Z79891 Long term (current) use of opiate analgesic: Secondary | ICD-10-CM | POA: Diagnosis not present

## 2021-03-30 DIAGNOSIS — G4700 Insomnia, unspecified: Secondary | ICD-10-CM | POA: Diagnosis not present

## 2021-03-30 DIAGNOSIS — E871 Hypo-osmolality and hyponatremia: Secondary | ICD-10-CM | POA: Diagnosis not present

## 2021-03-30 DIAGNOSIS — I252 Old myocardial infarction: Secondary | ICD-10-CM | POA: Diagnosis not present

## 2021-03-30 DIAGNOSIS — U071 COVID-19: Secondary | ICD-10-CM | POA: Diagnosis not present

## 2021-03-30 DIAGNOSIS — Z510 Encounter for antineoplastic radiation therapy: Secondary | ICD-10-CM | POA: Diagnosis not present

## 2021-03-30 DIAGNOSIS — C383 Malignant neoplasm of mediastinum, part unspecified: Secondary | ICD-10-CM | POA: Diagnosis not present

## 2021-03-30 DIAGNOSIS — K219 Gastro-esophageal reflux disease without esophagitis: Secondary | ICD-10-CM | POA: Diagnosis not present

## 2021-03-30 DIAGNOSIS — M6281 Muscle weakness (generalized): Secondary | ICD-10-CM | POA: Diagnosis not present

## 2021-03-30 DIAGNOSIS — Z79899 Other long term (current) drug therapy: Secondary | ICD-10-CM | POA: Diagnosis not present

## 2021-03-30 DIAGNOSIS — E860 Dehydration: Secondary | ICD-10-CM | POA: Diagnosis not present

## 2021-03-30 DIAGNOSIS — Z681 Body mass index (BMI) 19 or less, adult: Secondary | ICD-10-CM | POA: Diagnosis not present

## 2021-03-30 DIAGNOSIS — J449 Chronic obstructive pulmonary disease, unspecified: Secondary | ICD-10-CM | POA: Diagnosis not present

## 2021-03-30 DIAGNOSIS — C3491 Malignant neoplasm of unspecified part of right bronchus or lung: Secondary | ICD-10-CM | POA: Diagnosis not present

## 2021-03-30 DIAGNOSIS — D509 Iron deficiency anemia, unspecified: Secondary | ICD-10-CM | POA: Diagnosis not present

## 2021-03-30 DIAGNOSIS — Z792 Long term (current) use of antibiotics: Secondary | ICD-10-CM | POA: Diagnosis not present

## 2021-03-30 DIAGNOSIS — C3490 Malignant neoplasm of unspecified part of unspecified bronchus or lung: Secondary | ICD-10-CM | POA: Diagnosis not present

## 2021-03-30 DIAGNOSIS — I4891 Unspecified atrial fibrillation: Secondary | ICD-10-CM | POA: Diagnosis not present

## 2021-03-30 DIAGNOSIS — E119 Type 2 diabetes mellitus without complications: Secondary | ICD-10-CM | POA: Diagnosis not present

## 2021-03-30 DIAGNOSIS — E43 Unspecified severe protein-calorie malnutrition: Secondary | ICD-10-CM | POA: Diagnosis not present

## 2021-03-30 DIAGNOSIS — Z743 Need for continuous supervision: Secondary | ICD-10-CM | POA: Diagnosis not present

## 2021-03-30 DIAGNOSIS — I10 Essential (primary) hypertension: Secondary | ICD-10-CM | POA: Diagnosis not present

## 2021-03-30 DIAGNOSIS — Z9181 History of falling: Secondary | ICD-10-CM | POA: Diagnosis not present

## 2021-03-30 DIAGNOSIS — I482 Chronic atrial fibrillation, unspecified: Secondary | ICD-10-CM | POA: Diagnosis not present

## 2021-03-30 LAB — HEPATIC FUNCTION PANEL
ALT: 26 (ref 10–40)
AST: 29 (ref 14–40)
Alkaline Phosphatase: 122 (ref 25–125)
Bilirubin, Total: 3.5

## 2021-03-30 LAB — BASIC METABOLIC PANEL
BUN: 35 — AB (ref 4–21)
CO2: 23 — AB (ref 13–22)
Chloride: 87 — AB (ref 99–108)
Creatinine: 0.9 (ref 0.6–1.3)
Glucose: 124
Potassium: 3.1 — AB (ref 3.4–5.3)
Sodium: 129 — AB (ref 137–147)

## 2021-03-30 LAB — COMPREHENSIVE METABOLIC PANEL
Albumin: 3.8 (ref 3.5–5.0)
Calcium: 9.7 (ref 8.7–10.7)

## 2021-03-30 LAB — CBC AND DIFFERENTIAL
HCT: 38 — AB (ref 41–53)
Hemoglobin: 13.2 — AB (ref 13.5–17.5)
Neutrophils Absolute: 2.57
Platelets: 132 — AB (ref 150–399)
WBC: 3.1

## 2021-03-30 LAB — CBC
MCV: 95 — AB (ref 80–94)
RBC: 3.98 (ref 3.87–5.11)

## 2021-04-03 ENCOUNTER — Encounter: Payer: Self-pay | Admitting: Oncology

## 2021-04-04 DIAGNOSIS — I4891 Unspecified atrial fibrillation: Secondary | ICD-10-CM

## 2021-04-05 ENCOUNTER — Encounter: Payer: Self-pay | Admitting: Oncology

## 2021-04-05 ENCOUNTER — Other Ambulatory Visit: Payer: Self-pay | Admitting: Oncology

## 2021-04-05 DIAGNOSIS — C3410 Malignant neoplasm of upper lobe, unspecified bronchus or lung: Secondary | ICD-10-CM

## 2021-04-06 ENCOUNTER — Telehealth: Payer: Self-pay | Admitting: Oncology

## 2021-04-06 DIAGNOSIS — E43 Unspecified severe protein-calorie malnutrition: Secondary | ICD-10-CM | POA: Diagnosis not present

## 2021-04-06 DIAGNOSIS — Z7901 Long term (current) use of anticoagulants: Secondary | ICD-10-CM | POA: Diagnosis not present

## 2021-04-06 DIAGNOSIS — I313 Pericardial effusion (noninflammatory): Secondary | ICD-10-CM | POA: Diagnosis not present

## 2021-04-06 DIAGNOSIS — I10 Essential (primary) hypertension: Secondary | ICD-10-CM | POA: Diagnosis not present

## 2021-04-06 DIAGNOSIS — J690 Pneumonitis due to inhalation of food and vomit: Secondary | ICD-10-CM | POA: Diagnosis not present

## 2021-04-06 DIAGNOSIS — R0602 Shortness of breath: Secondary | ICD-10-CM | POA: Diagnosis not present

## 2021-04-06 DIAGNOSIS — Z20822 Contact with and (suspected) exposure to covid-19: Secondary | ICD-10-CM | POA: Diagnosis not present

## 2021-04-06 DIAGNOSIS — I2510 Atherosclerotic heart disease of native coronary artery without angina pectoris: Secondary | ICD-10-CM | POA: Diagnosis not present

## 2021-04-06 DIAGNOSIS — E46 Unspecified protein-calorie malnutrition: Secondary | ICD-10-CM | POA: Diagnosis not present

## 2021-04-06 DIAGNOSIS — E7800 Pure hypercholesterolemia, unspecified: Secondary | ICD-10-CM | POA: Diagnosis not present

## 2021-04-06 DIAGNOSIS — R6889 Other general symptoms and signs: Secondary | ICD-10-CM | POA: Diagnosis not present

## 2021-04-06 DIAGNOSIS — I110 Hypertensive heart disease with heart failure: Secondary | ICD-10-CM | POA: Diagnosis not present

## 2021-04-06 DIAGNOSIS — R531 Weakness: Secondary | ICD-10-CM | POA: Diagnosis not present

## 2021-04-06 DIAGNOSIS — D696 Thrombocytopenia, unspecified: Secondary | ICD-10-CM | POA: Diagnosis not present

## 2021-04-06 DIAGNOSIS — K219 Gastro-esophageal reflux disease without esophagitis: Secondary | ICD-10-CM | POA: Diagnosis not present

## 2021-04-06 DIAGNOSIS — J90 Pleural effusion, not elsewhere classified: Secondary | ICD-10-CM | POA: Diagnosis not present

## 2021-04-06 DIAGNOSIS — M109 Gout, unspecified: Secondary | ICD-10-CM | POA: Diagnosis not present

## 2021-04-06 DIAGNOSIS — I482 Chronic atrial fibrillation, unspecified: Secondary | ICD-10-CM | POA: Diagnosis not present

## 2021-04-06 DIAGNOSIS — I700 Atherosclerosis of aorta: Secondary | ICD-10-CM | POA: Diagnosis not present

## 2021-04-06 DIAGNOSIS — J449 Chronic obstructive pulmonary disease, unspecified: Secondary | ICD-10-CM | POA: Diagnosis not present

## 2021-04-06 DIAGNOSIS — Z79899 Other long term (current) drug therapy: Secondary | ICD-10-CM | POA: Diagnosis not present

## 2021-04-06 DIAGNOSIS — Z7952 Long term (current) use of systemic steroids: Secondary | ICD-10-CM | POA: Diagnosis not present

## 2021-04-06 DIAGNOSIS — G4700 Insomnia, unspecified: Secondary | ICD-10-CM | POA: Diagnosis not present

## 2021-04-06 DIAGNOSIS — I509 Heart failure, unspecified: Secondary | ICD-10-CM | POA: Diagnosis not present

## 2021-04-06 DIAGNOSIS — D539 Nutritional anemia, unspecified: Secondary | ICD-10-CM | POA: Diagnosis not present

## 2021-04-06 DIAGNOSIS — M6281 Muscle weakness (generalized): Secondary | ICD-10-CM | POA: Diagnosis not present

## 2021-04-06 DIAGNOSIS — R918 Other nonspecific abnormal finding of lung field: Secondary | ICD-10-CM | POA: Diagnosis not present

## 2021-04-06 DIAGNOSIS — Z9181 History of falling: Secondary | ICD-10-CM | POA: Diagnosis not present

## 2021-04-06 DIAGNOSIS — E871 Hypo-osmolality and hyponatremia: Secondary | ICD-10-CM | POA: Diagnosis not present

## 2021-04-06 DIAGNOSIS — R5381 Other malaise: Secondary | ICD-10-CM | POA: Diagnosis not present

## 2021-04-06 DIAGNOSIS — R0902 Hypoxemia: Secondary | ICD-10-CM | POA: Diagnosis not present

## 2021-04-06 DIAGNOSIS — J9611 Chronic respiratory failure with hypoxia: Secondary | ICD-10-CM | POA: Diagnosis not present

## 2021-04-06 DIAGNOSIS — R451 Restlessness and agitation: Secondary | ICD-10-CM | POA: Diagnosis not present

## 2021-04-06 DIAGNOSIS — R1310 Dysphagia, unspecified: Secondary | ICD-10-CM | POA: Diagnosis not present

## 2021-04-06 DIAGNOSIS — Z792 Long term (current) use of antibiotics: Secondary | ICD-10-CM | POA: Diagnosis not present

## 2021-04-06 DIAGNOSIS — I517 Cardiomegaly: Secondary | ICD-10-CM | POA: Diagnosis not present

## 2021-04-06 DIAGNOSIS — R609 Edema, unspecified: Secondary | ICD-10-CM | POA: Diagnosis not present

## 2021-04-06 DIAGNOSIS — E119 Type 2 diabetes mellitus without complications: Secondary | ICD-10-CM | POA: Diagnosis not present

## 2021-04-06 DIAGNOSIS — U071 COVID-19: Secondary | ICD-10-CM | POA: Diagnosis not present

## 2021-04-06 DIAGNOSIS — D649 Anemia, unspecified: Secondary | ICD-10-CM | POA: Diagnosis not present

## 2021-04-06 DIAGNOSIS — J9811 Atelectasis: Secondary | ICD-10-CM | POA: Diagnosis not present

## 2021-04-06 DIAGNOSIS — M1990 Unspecified osteoarthritis, unspecified site: Secondary | ICD-10-CM | POA: Diagnosis not present

## 2021-04-06 DIAGNOSIS — Z743 Need for continuous supervision: Secondary | ICD-10-CM | POA: Diagnosis not present

## 2021-04-06 DIAGNOSIS — C3490 Malignant neoplasm of unspecified part of unspecified bronchus or lung: Secondary | ICD-10-CM | POA: Diagnosis not present

## 2021-04-06 DIAGNOSIS — I4891 Unspecified atrial fibrillation: Secondary | ICD-10-CM | POA: Diagnosis not present

## 2021-04-06 DIAGNOSIS — G301 Alzheimer's disease with late onset: Secondary | ICD-10-CM | POA: Diagnosis not present

## 2021-04-06 NOTE — Telephone Encounter (Signed)
Per 7/24 Staff Msg, patient scheduled for 8/18 Labs, Follow Up

## 2021-04-07 DIAGNOSIS — E46 Unspecified protein-calorie malnutrition: Secondary | ICD-10-CM | POA: Diagnosis not present

## 2021-04-07 DIAGNOSIS — U071 COVID-19: Secondary | ICD-10-CM | POA: Diagnosis not present

## 2021-04-07 DIAGNOSIS — R5381 Other malaise: Secondary | ICD-10-CM | POA: Diagnosis not present

## 2021-04-07 DIAGNOSIS — J449 Chronic obstructive pulmonary disease, unspecified: Secondary | ICD-10-CM | POA: Diagnosis not present

## 2021-04-09 DIAGNOSIS — J449 Chronic obstructive pulmonary disease, unspecified: Secondary | ICD-10-CM | POA: Diagnosis not present

## 2021-04-09 DIAGNOSIS — J90 Pleural effusion, not elsewhere classified: Secondary | ICD-10-CM | POA: Diagnosis not present

## 2021-04-09 DIAGNOSIS — Z20822 Contact with and (suspected) exposure to covid-19: Secondary | ICD-10-CM | POA: Diagnosis not present

## 2021-04-09 DIAGNOSIS — R0602 Shortness of breath: Secondary | ICD-10-CM | POA: Diagnosis not present

## 2021-04-12 DIAGNOSIS — D649 Anemia, unspecified: Secondary | ICD-10-CM | POA: Diagnosis not present

## 2021-04-12 DIAGNOSIS — E119 Type 2 diabetes mellitus without complications: Secondary | ICD-10-CM | POA: Diagnosis not present

## 2021-04-12 DIAGNOSIS — I4891 Unspecified atrial fibrillation: Secondary | ICD-10-CM | POA: Diagnosis not present

## 2021-04-13 ENCOUNTER — Telehealth: Payer: Self-pay | Admitting: Emergency Medicine

## 2021-04-13 MED ORDER — PREDNISONE 10 MG PO TABS: ORAL_TABLET | ORAL | 0 refills | Status: AC

## 2021-04-13 NOTE — Telephone Encounter (Signed)
Called and spoke with patient and his wife. They were both very confused about what medications he is currently. From their understanding, he is not currently on prednisone. Juan Valdez stated that she believes the prednisone will help him and wants him to have it. She was unsure of the pharmacy the facility was using. I advised her that I would call the facility myself to check. She stated that he is at [facility] in [place]   I called [facility] and was advised that they use the [facility] in [facility].   I have sent to the prednisone to [facility].   Nothing further needed.

## 2021-04-13 NOTE — Telephone Encounter (Signed)
Called and spoke with pt who states that he has had increased SOB x2 weeks now. States that he also has a dry cough and also has complaints of wheezing.  Denies any complaints of chest tightness or fever-temp today 8/1 was 97.0  Pt said that he tested positive for covid but could not tell me exactly when it was that he tested positive. Pt said that he was in [facility] last week for Covid and when discharged from there was sent straight to a rehab center where he is currently.  Pt said that he has been using his rescue inhaler at least 5 times daily if not more and has been using his nebulizer at least 4 times daily. Pt is using his Trelegy inhaler as prescribed.  Pt wants to know if there is anything that we can recommend. Dr. Lamonte Sakai, please advise.

## 2021-04-13 NOTE — Telephone Encounter (Signed)
Has he been treated with steroids? Is there a provider checking on him at rehab?  I think it would be appropriate to treat with prednisone taper if he is not already from his COVID dx.  Ok to write for pred > Take 40mg  daily for 3 days, then 30mg  daily for 3 days, then 20mg  daily for 3 days, then 10mg  daily for 3 days, then stop

## 2021-04-14 DIAGNOSIS — E46 Unspecified protein-calorie malnutrition: Secondary | ICD-10-CM | POA: Diagnosis not present

## 2021-04-14 DIAGNOSIS — I482 Chronic atrial fibrillation, unspecified: Secondary | ICD-10-CM | POA: Diagnosis not present

## 2021-04-14 DIAGNOSIS — R5381 Other malaise: Secondary | ICD-10-CM | POA: Diagnosis not present

## 2021-04-14 DIAGNOSIS — C3490 Malignant neoplasm of unspecified part of unspecified bronchus or lung: Secondary | ICD-10-CM | POA: Diagnosis not present

## 2021-04-15 DIAGNOSIS — R5381 Other malaise: Secondary | ICD-10-CM | POA: Diagnosis not present

## 2021-04-15 DIAGNOSIS — E46 Unspecified protein-calorie malnutrition: Secondary | ICD-10-CM | POA: Diagnosis not present

## 2021-04-19 DIAGNOSIS — E876 Hypokalemia: Secondary | ICD-10-CM | POA: Diagnosis not present

## 2021-04-19 DIAGNOSIS — E43 Unspecified severe protein-calorie malnutrition: Secondary | ICD-10-CM | POA: Diagnosis not present

## 2021-04-19 DIAGNOSIS — I313 Pericardial effusion (noninflammatory): Secondary | ICD-10-CM | POA: Diagnosis not present

## 2021-04-19 DIAGNOSIS — Z7901 Long term (current) use of anticoagulants: Secondary | ICD-10-CM | POA: Diagnosis not present

## 2021-04-19 DIAGNOSIS — E871 Hypo-osmolality and hyponatremia: Secondary | ICD-10-CM | POA: Diagnosis not present

## 2021-04-19 DIAGNOSIS — Z743 Need for continuous supervision: Secondary | ICD-10-CM | POA: Diagnosis not present

## 2021-04-19 DIAGNOSIS — R1312 Dysphagia, oropharyngeal phase: Secondary | ICD-10-CM | POA: Diagnosis not present

## 2021-04-19 DIAGNOSIS — M1000 Idiopathic gout, unspecified site: Secondary | ICD-10-CM | POA: Diagnosis not present

## 2021-04-19 DIAGNOSIS — J690 Pneumonitis due to inhalation of food and vomit: Secondary | ICD-10-CM | POA: Diagnosis not present

## 2021-04-19 DIAGNOSIS — R278 Other lack of coordination: Secondary | ICD-10-CM | POA: Diagnosis not present

## 2021-04-19 DIAGNOSIS — I700 Atherosclerosis of aorta: Secondary | ICD-10-CM | POA: Diagnosis not present

## 2021-04-19 DIAGNOSIS — I110 Hypertensive heart disease with heart failure: Secondary | ICD-10-CM | POA: Diagnosis not present

## 2021-04-19 DIAGNOSIS — R279 Unspecified lack of coordination: Secondary | ICD-10-CM | POA: Diagnosis not present

## 2021-04-19 DIAGNOSIS — E119 Type 2 diabetes mellitus without complications: Secondary | ICD-10-CM | POA: Diagnosis not present

## 2021-04-19 DIAGNOSIS — J9811 Atelectasis: Secondary | ICD-10-CM | POA: Diagnosis not present

## 2021-04-19 DIAGNOSIS — I2510 Atherosclerotic heart disease of native coronary artery without angina pectoris: Secondary | ICD-10-CM | POA: Diagnosis not present

## 2021-04-19 DIAGNOSIS — R0602 Shortness of breath: Secondary | ICD-10-CM | POA: Diagnosis not present

## 2021-04-19 DIAGNOSIS — C3490 Malignant neoplasm of unspecified part of unspecified bronchus or lung: Secondary | ICD-10-CM | POA: Diagnosis not present

## 2021-04-19 DIAGNOSIS — M109 Gout, unspecified: Secondary | ICD-10-CM | POA: Diagnosis not present

## 2021-04-19 DIAGNOSIS — E7800 Pure hypercholesterolemia, unspecified: Secondary | ICD-10-CM | POA: Diagnosis not present

## 2021-04-19 DIAGNOSIS — J449 Chronic obstructive pulmonary disease, unspecified: Secondary | ICD-10-CM | POA: Diagnosis not present

## 2021-04-19 DIAGNOSIS — R5381 Other malaise: Secondary | ICD-10-CM | POA: Diagnosis not present

## 2021-04-19 DIAGNOSIS — K219 Gastro-esophageal reflux disease without esophagitis: Secondary | ICD-10-CM | POA: Diagnosis not present

## 2021-04-19 DIAGNOSIS — D696 Thrombocytopenia, unspecified: Secondary | ICD-10-CM | POA: Diagnosis not present

## 2021-04-19 DIAGNOSIS — I517 Cardiomegaly: Secondary | ICD-10-CM | POA: Diagnosis not present

## 2021-04-19 DIAGNOSIS — I4891 Unspecified atrial fibrillation: Secondary | ICD-10-CM | POA: Diagnosis not present

## 2021-04-19 DIAGNOSIS — D539 Nutritional anemia, unspecified: Secondary | ICD-10-CM | POA: Diagnosis not present

## 2021-04-19 DIAGNOSIS — R609 Edema, unspecified: Secondary | ICD-10-CM | POA: Diagnosis not present

## 2021-04-19 DIAGNOSIS — J9611 Chronic respiratory failure with hypoxia: Secondary | ICD-10-CM | POA: Diagnosis not present

## 2021-04-19 DIAGNOSIS — Z8616 Personal history of COVID-19: Secondary | ICD-10-CM | POA: Diagnosis not present

## 2021-04-19 DIAGNOSIS — R0902 Hypoxemia: Secondary | ICD-10-CM | POA: Diagnosis not present

## 2021-04-19 DIAGNOSIS — Z931 Gastrostomy status: Secondary | ICD-10-CM | POA: Diagnosis not present

## 2021-04-19 DIAGNOSIS — R262 Difficulty in walking, not elsewhere classified: Secondary | ICD-10-CM | POA: Diagnosis not present

## 2021-04-19 DIAGNOSIS — Z792 Long term (current) use of antibiotics: Secondary | ICD-10-CM | POA: Diagnosis not present

## 2021-04-19 DIAGNOSIS — G4700 Insomnia, unspecified: Secondary | ICD-10-CM | POA: Diagnosis not present

## 2021-04-19 DIAGNOSIS — Z79899 Other long term (current) drug therapy: Secondary | ICD-10-CM | POA: Diagnosis not present

## 2021-04-19 DIAGNOSIS — J90 Pleural effusion, not elsewhere classified: Secondary | ICD-10-CM | POA: Diagnosis not present

## 2021-04-19 DIAGNOSIS — Z7952 Long term (current) use of systemic steroids: Secondary | ICD-10-CM | POA: Diagnosis not present

## 2021-04-19 DIAGNOSIS — M1990 Unspecified osteoarthritis, unspecified site: Secondary | ICD-10-CM | POA: Diagnosis not present

## 2021-04-19 DIAGNOSIS — M6259 Muscle wasting and atrophy, not elsewhere classified, multiple sites: Secondary | ICD-10-CM | POA: Diagnosis not present

## 2021-04-19 DIAGNOSIS — R918 Other nonspecific abnormal finding of lung field: Secondary | ICD-10-CM | POA: Diagnosis not present

## 2021-04-19 DIAGNOSIS — I509 Heart failure, unspecified: Secondary | ICD-10-CM | POA: Diagnosis not present

## 2021-04-22 DIAGNOSIS — I2510 Atherosclerotic heart disease of native coronary artery without angina pectoris: Secondary | ICD-10-CM | POA: Diagnosis not present

## 2021-04-22 DIAGNOSIS — M1000 Idiopathic gout, unspecified site: Secondary | ICD-10-CM | POA: Diagnosis not present

## 2021-04-22 DIAGNOSIS — C3490 Malignant neoplasm of unspecified part of unspecified bronchus or lung: Secondary | ICD-10-CM | POA: Diagnosis not present

## 2021-04-22 DIAGNOSIS — M6259 Muscle wasting and atrophy, not elsewhere classified, multiple sites: Secondary | ICD-10-CM | POA: Diagnosis not present

## 2021-04-22 DIAGNOSIS — D696 Thrombocytopenia, unspecified: Secondary | ICD-10-CM | POA: Diagnosis not present

## 2021-04-22 DIAGNOSIS — C3491 Malignant neoplasm of unspecified part of right bronchus or lung: Secondary | ICD-10-CM | POA: Diagnosis not present

## 2021-04-22 DIAGNOSIS — R5381 Other malaise: Secondary | ICD-10-CM | POA: Diagnosis not present

## 2021-04-22 DIAGNOSIS — Z931 Gastrostomy status: Secondary | ICD-10-CM | POA: Diagnosis not present

## 2021-04-22 DIAGNOSIS — I4891 Unspecified atrial fibrillation: Secondary | ICD-10-CM | POA: Diagnosis not present

## 2021-04-22 DIAGNOSIS — J690 Pneumonitis due to inhalation of food and vomit: Secondary | ICD-10-CM | POA: Diagnosis not present

## 2021-04-22 DIAGNOSIS — L03113 Cellulitis of right upper limb: Secondary | ICD-10-CM | POA: Diagnosis not present

## 2021-04-22 DIAGNOSIS — E119 Type 2 diabetes mellitus without complications: Secondary | ICD-10-CM | POA: Diagnosis not present

## 2021-04-22 DIAGNOSIS — G4700 Insomnia, unspecified: Secondary | ICD-10-CM | POA: Diagnosis not present

## 2021-04-22 DIAGNOSIS — Z8616 Personal history of COVID-19: Secondary | ICD-10-CM | POA: Diagnosis not present

## 2021-04-22 DIAGNOSIS — J90 Pleural effusion, not elsewhere classified: Secondary | ICD-10-CM | POA: Diagnosis not present

## 2021-04-22 DIAGNOSIS — Z600 Problems of adjustment to life-cycle transitions: Secondary | ICD-10-CM | POA: Diagnosis not present

## 2021-04-22 DIAGNOSIS — E43 Unspecified severe protein-calorie malnutrition: Secondary | ICD-10-CM | POA: Diagnosis not present

## 2021-04-22 DIAGNOSIS — E876 Hypokalemia: Secondary | ICD-10-CM | POA: Diagnosis not present

## 2021-04-22 DIAGNOSIS — K219 Gastro-esophageal reflux disease without esophagitis: Secondary | ICD-10-CM | POA: Diagnosis not present

## 2021-04-22 DIAGNOSIS — Z743 Need for continuous supervision: Secondary | ICD-10-CM | POA: Diagnosis not present

## 2021-04-22 DIAGNOSIS — K567 Ileus, unspecified: Secondary | ICD-10-CM | POA: Diagnosis not present

## 2021-04-22 DIAGNOSIS — I509 Heart failure, unspecified: Secondary | ICD-10-CM | POA: Diagnosis not present

## 2021-04-22 DIAGNOSIS — G893 Neoplasm related pain (acute) (chronic): Secondary | ICD-10-CM | POA: Diagnosis not present

## 2021-04-22 DIAGNOSIS — R278 Other lack of coordination: Secondary | ICD-10-CM | POA: Diagnosis not present

## 2021-04-22 DIAGNOSIS — R1312 Dysphagia, oropharyngeal phase: Secondary | ICD-10-CM | POA: Diagnosis not present

## 2021-04-22 DIAGNOSIS — E871 Hypo-osmolality and hyponatremia: Secondary | ICD-10-CM | POA: Diagnosis not present

## 2021-04-22 DIAGNOSIS — R279 Unspecified lack of coordination: Secondary | ICD-10-CM | POA: Diagnosis not present

## 2021-04-22 DIAGNOSIS — R262 Difficulty in walking, not elsewhere classified: Secondary | ICD-10-CM | POA: Diagnosis not present

## 2021-04-22 DIAGNOSIS — E785 Hyperlipidemia, unspecified: Secondary | ICD-10-CM | POA: Diagnosis not present

## 2021-04-22 DIAGNOSIS — R109 Unspecified abdominal pain: Secondary | ICD-10-CM | POA: Diagnosis not present

## 2021-04-22 DIAGNOSIS — J449 Chronic obstructive pulmonary disease, unspecified: Secondary | ICD-10-CM | POA: Diagnosis not present

## 2021-04-23 DIAGNOSIS — J449 Chronic obstructive pulmonary disease, unspecified: Secondary | ICD-10-CM | POA: Diagnosis not present

## 2021-04-23 DIAGNOSIS — I509 Heart failure, unspecified: Secondary | ICD-10-CM | POA: Diagnosis not present

## 2021-04-23 DIAGNOSIS — Z931 Gastrostomy status: Secondary | ICD-10-CM | POA: Diagnosis not present

## 2021-04-23 DIAGNOSIS — E119 Type 2 diabetes mellitus without complications: Secondary | ICD-10-CM | POA: Diagnosis not present

## 2021-04-23 DIAGNOSIS — I2510 Atherosclerotic heart disease of native coronary artery without angina pectoris: Secondary | ICD-10-CM | POA: Diagnosis not present

## 2021-04-23 DIAGNOSIS — C3490 Malignant neoplasm of unspecified part of unspecified bronchus or lung: Secondary | ICD-10-CM | POA: Diagnosis not present

## 2021-04-23 NOTE — Progress Notes (Signed)
[facility]  [address] [place],  [place]  [address] [phone]  Clinic Day:  04/30/2021  Referring physician: Ronita Hipps, MD  This document serves as a record of services personally performed by Korianna Washer Macarthur Critchley, MD. It was created on their behalf by [facility] E, a trained medical scribe. The creation of this record is based on the scribe's personal observations and the provider's statements to them.  HISTORY OF PRESENT ILLNESS:  The patient is a 75 y.o. male  with mediastinal recurrence of previous lung adenocarcinoma.  Due to his failing health, his chemotherapy was discontinued early.  Daily radiation was ultimately stopped as well due to his health abruptly declining.  He was taken to the emergency room at his last visit due to acute respiratory failure.  He was ultimately hospitalized for COVID pneumonia.  Since being discharged, the patient has been residing at a local rehab facility.  He is getting physical therapy there, but his recovery has been very low.   He comes into clinic today for routine follow-up.  He is currently on 3 L of oxygen for baseline shortness of breath.  He is essentially wheelchair-bound as he is unable to walk just a few feet.    PHYSICAL EXAM:  Blood pressure (!) 95/52, pulse 68, temperature (!) 97.4 F (36.3 C), resp. rate 16, height 5\' 9"  (1.753 m), SpO2 98 %. Wt Readings from Last 3 Encounters:  03/30/21 110 lb 1.6 oz (49.9 kg)  03/03/21 132 lb (59.9 kg)  03/02/21 131 lb 12.8 oz (59.8 kg)   Body mass index is 16.26 kg/m. Performance status (ECOG): 1 Physical Exam Constitutional:      Appearance: Normal appearance. He is ill-appearing.     Comments: Very frail and weak in appearance; wearing O2 per nasal cannula.  HENT:     Mouth/Throat:     Mouth: Mucous membranes are moist.     Pharynx: Oropharynx is clear. No oropharyngeal exudate or posterior oropharyngeal erythema.  Cardiovascular:     Rate and  Rhythm: Normal rate and regular rhythm.     Heart sounds: No murmur heard.   No friction rub. No gallop.  Pulmonary:     Effort: No respiratory distress.     Breath sounds: No wheezing or rales. Rhonchi: harsch rhonchi bilaterally.    Comments: Decreased breath sounds bilaterally Abdominal:     General: Bowel sounds are normal. There is no distension.     Palpations: Abdomen is soft. There is no mass.     Tenderness: There is no abdominal tenderness.  Musculoskeletal:        General: No swelling.     Right lower leg: Edema present.     Left lower leg: Edema present.  Lymphadenopathy:     Cervical: No cervical adenopathy.     Upper Body:     Right upper body: No supraclavicular or axillary adenopathy.     Left upper body: No supraclavicular or axillary adenopathy.     Lower Body: No right inguinal adenopathy. No left inguinal adenopathy.  Skin:    General: Skin is warm.     Coloration: Skin is not jaundiced.     Findings: No lesion or rash.  Neurological:     General: No focal deficit present.     Mental Status: He is alert and oriented to person, place, and time. Mental status is at baseline.     Cranial Nerves: Cranial nerves are intact.  Psychiatric:  Mood and Affect: Mood normal.        Behavior: Behavior normal.        Thought Content: Thought content normal.   LABS:  Ref. Range 04/30/2021 00:00  Sodium Latest Ref Range: 137 - 147  134 (A)  Potassium Latest Ref Range: 3.4 - 5.3  3.7  Chloride Latest Ref Range: 99 - 108  96 (A)  CO2 Latest Ref Range: 13 - 22  33 (A)  Glucose Unknown 128  BUN Latest Ref Range: 4 - 21  37 (A)  Creatinine Latest Ref Range: 0.6 - 1.3  0.8  Calcium Latest Ref Range: 8.7 - 10.7  8.5 (A)  Alkaline Phosphatase Latest Ref Range: 25 - 125  102  Albumin Latest Ref Range: 3.5 - 5.0  2.7 (A)  AST Latest Ref Range: 14 - 40  28  ALT Latest Ref Range: 10 - 40  15  Bilirubin, Total Unknown 0.6    Ref. Range 04/30/2021 00:00  WBC Unknown 6.9   RBC Latest Ref Range: 3.87 - 5.11  3.73 (A)  Hemoglobin Latest Ref Range: 13.5 - 17.5  12.6 (A)  HCT Latest Ref Range: 41 - 53  38 (A)  Platelets Latest Ref Range: 150 - 399  100 (A)  NEUT# Unknown 6.28    ASSESSMENT & PLAN:  A 75 y.o. male with hilar/bilateral mediastinal lymph node recurrence of his previous lung adenocarcinoma.  Unfortunately, this gentleman's health remains very poor.  Based upon his continuous clinical decline, I really have no other choice but to bring in hospice care.  My concern is his life expectancy may not extend beyond the next 4-6 weeks if his poor health does not improve.  He is currently at a nursing/rehab facility.  We will get in contact with them about bringing in Hospice.  Although frustrated, the patient and his loved one understand why this is being done.  No further follow-up visits will be scheduled, but they know to contact us if they have additional questions/concerns regarding his tenuous clinical picture.    I, Rita Ohara, am acting as scribe for Marice Potter, MD    I have reviewed this report as typed by the medical scribe, and it is complete and accurate.  Jeralyn Nolden Macarthur Critchley, MD

## 2021-04-24 ENCOUNTER — Telehealth: Payer: Self-pay | Admitting: Cardiology

## 2021-04-24 DIAGNOSIS — K219 Gastro-esophageal reflux disease without esophagitis: Secondary | ICD-10-CM | POA: Diagnosis not present

## 2021-04-24 DIAGNOSIS — J449 Chronic obstructive pulmonary disease, unspecified: Secondary | ICD-10-CM | POA: Diagnosis not present

## 2021-04-24 DIAGNOSIS — E876 Hypokalemia: Secondary | ICD-10-CM | POA: Diagnosis not present

## 2021-04-24 DIAGNOSIS — I2510 Atherosclerotic heart disease of native coronary artery without angina pectoris: Secondary | ICD-10-CM | POA: Diagnosis not present

## 2021-04-24 DIAGNOSIS — I4891 Unspecified atrial fibrillation: Secondary | ICD-10-CM | POA: Diagnosis not present

## 2021-04-24 DIAGNOSIS — C3490 Malignant neoplasm of unspecified part of unspecified bronchus or lung: Secondary | ICD-10-CM | POA: Diagnosis not present

## 2021-04-24 DIAGNOSIS — E785 Hyperlipidemia, unspecified: Secondary | ICD-10-CM | POA: Diagnosis not present

## 2021-04-24 DIAGNOSIS — M1000 Idiopathic gout, unspecified site: Secondary | ICD-10-CM | POA: Diagnosis not present

## 2021-04-24 DIAGNOSIS — I509 Heart failure, unspecified: Secondary | ICD-10-CM | POA: Diagnosis not present

## 2021-04-24 DIAGNOSIS — E119 Type 2 diabetes mellitus without complications: Secondary | ICD-10-CM | POA: Diagnosis not present

## 2021-04-24 NOTE — Telephone Encounter (Signed)
I have Dr. Eileen Stanford calling from [facility] wanting to speak with Dr. Geraldo Pitter

## 2021-04-28 DIAGNOSIS — J449 Chronic obstructive pulmonary disease, unspecified: Secondary | ICD-10-CM | POA: Diagnosis not present

## 2021-04-28 DIAGNOSIS — I509 Heart failure, unspecified: Secondary | ICD-10-CM | POA: Diagnosis not present

## 2021-04-28 DIAGNOSIS — D696 Thrombocytopenia, unspecified: Secondary | ICD-10-CM | POA: Diagnosis not present

## 2021-04-28 DIAGNOSIS — C3490 Malignant neoplasm of unspecified part of unspecified bronchus or lung: Secondary | ICD-10-CM | POA: Diagnosis not present

## 2021-04-30 ENCOUNTER — Inpatient Hospital Stay: Payer: Medicare Other | Attending: Oncology

## 2021-04-30 ENCOUNTER — Inpatient Hospital Stay (INDEPENDENT_AMBULATORY_CARE_PROVIDER_SITE_OTHER): Payer: Medicare Other | Admitting: Oncology

## 2021-04-30 ENCOUNTER — Encounter: Payer: Self-pay | Admitting: Oncology

## 2021-04-30 VITALS — BP 95/52 | HR 68 | Temp 97.4°F | Resp 16 | Ht 69.0 in

## 2021-04-30 DIAGNOSIS — I509 Heart failure, unspecified: Secondary | ICD-10-CM | POA: Diagnosis not present

## 2021-04-30 DIAGNOSIS — C3491 Malignant neoplasm of unspecified part of right bronchus or lung: Secondary | ICD-10-CM

## 2021-04-30 DIAGNOSIS — C3490 Malignant neoplasm of unspecified part of unspecified bronchus or lung: Secondary | ICD-10-CM | POA: Diagnosis not present

## 2021-04-30 DIAGNOSIS — K567 Ileus, unspecified: Secondary | ICD-10-CM | POA: Diagnosis not present

## 2021-04-30 DIAGNOSIS — R109 Unspecified abdominal pain: Secondary | ICD-10-CM | POA: Diagnosis not present

## 2021-04-30 DIAGNOSIS — J449 Chronic obstructive pulmonary disease, unspecified: Secondary | ICD-10-CM | POA: Diagnosis not present

## 2021-04-30 DIAGNOSIS — C3410 Malignant neoplasm of upper lobe, unspecified bronchus or lung: Secondary | ICD-10-CM

## 2021-04-30 LAB — CBC AND DIFFERENTIAL
HCT: 38 — AB (ref 41–53)
Hemoglobin: 12.6 — AB (ref 13.5–17.5)
Neutrophils Absolute: 6.28
Platelets: 100 — AB (ref 150–399)
WBC: 6.9

## 2021-04-30 LAB — HEPATIC FUNCTION PANEL
ALT: 15 (ref 10–40)
AST: 28 (ref 14–40)
Alkaline Phosphatase: 102 (ref 25–125)
Bilirubin, Total: 0.6

## 2021-04-30 LAB — BASIC METABOLIC PANEL
BUN: 37 — AB (ref 4–21)
CO2: 33 — AB (ref 13–22)
Chloride: 96 — AB (ref 99–108)
Creatinine: 0.8 (ref 0.6–1.3)
Glucose: 128
Potassium: 3.7 (ref 3.4–5.3)
Sodium: 134 — AB (ref 137–147)

## 2021-04-30 LAB — COMPREHENSIVE METABOLIC PANEL
Albumin: 2.7 — AB (ref 3.5–5.0)
Calcium: 8.5 — AB (ref 8.7–10.7)

## 2021-04-30 LAB — CBC: RBC: 3.73 — AB (ref 3.87–5.11)

## 2021-04-30 NOTE — Progress Notes (Signed)
REFERRAL TO [facility] MADE TODAY PER DR. Bobby Rumpf.

## 2021-05-01 ENCOUNTER — Telehealth: Payer: Self-pay

## 2021-05-04 DIAGNOSIS — K567 Ileus, unspecified: Secondary | ICD-10-CM | POA: Diagnosis not present

## 2021-05-04 DIAGNOSIS — J449 Chronic obstructive pulmonary disease, unspecified: Secondary | ICD-10-CM | POA: Diagnosis not present

## 2021-05-04 DIAGNOSIS — C3490 Malignant neoplasm of unspecified part of unspecified bronchus or lung: Secondary | ICD-10-CM | POA: Diagnosis not present

## 2021-05-04 DIAGNOSIS — I509 Heart failure, unspecified: Secondary | ICD-10-CM | POA: Diagnosis not present

## 2021-05-04 DIAGNOSIS — G893 Neoplasm related pain (acute) (chronic): Secondary | ICD-10-CM | POA: Diagnosis not present

## 2021-05-05 ENCOUNTER — Encounter: Payer: Self-pay | Admitting: Oncology

## 2021-05-06 DIAGNOSIS — G893 Neoplasm related pain (acute) (chronic): Secondary | ICD-10-CM | POA: Diagnosis not present

## 2021-05-06 DIAGNOSIS — Z600 Problems of adjustment to life-cycle transitions: Secondary | ICD-10-CM | POA: Diagnosis not present

## 2021-05-06 DIAGNOSIS — K567 Ileus, unspecified: Secondary | ICD-10-CM | POA: Diagnosis not present

## 2021-05-06 DIAGNOSIS — L03113 Cellulitis of right upper limb: Secondary | ICD-10-CM | POA: Diagnosis not present

## 2021-05-06 DIAGNOSIS — C3490 Malignant neoplasm of unspecified part of unspecified bronchus or lung: Secondary | ICD-10-CM | POA: Diagnosis not present

## 2021-05-06 DIAGNOSIS — J449 Chronic obstructive pulmonary disease, unspecified: Secondary | ICD-10-CM | POA: Diagnosis not present

## 2021-05-06 DIAGNOSIS — I509 Heart failure, unspecified: Secondary | ICD-10-CM | POA: Diagnosis not present

## 2021-05-07 DIAGNOSIS — C3490 Malignant neoplasm of unspecified part of unspecified bronchus or lung: Secondary | ICD-10-CM | POA: Diagnosis not present

## 2021-05-07 DIAGNOSIS — G893 Neoplasm related pain (acute) (chronic): Secondary | ICD-10-CM | POA: Diagnosis not present

## 2021-05-07 DIAGNOSIS — K567 Ileus, unspecified: Secondary | ICD-10-CM | POA: Diagnosis not present

## 2021-05-07 DIAGNOSIS — J449 Chronic obstructive pulmonary disease, unspecified: Secondary | ICD-10-CM | POA: Diagnosis not present

## 2021-05-07 DIAGNOSIS — L03113 Cellulitis of right upper limb: Secondary | ICD-10-CM | POA: Diagnosis not present

## 2021-05-07 DIAGNOSIS — I509 Heart failure, unspecified: Secondary | ICD-10-CM | POA: Diagnosis not present

## 2021-05-07 DIAGNOSIS — Z600 Problems of adjustment to life-cycle transitions: Secondary | ICD-10-CM | POA: Diagnosis not present

## 2021-05-07 NOTE — Progress Notes (Signed)
Brandi, nurse, with [facility], called in over the weekend, to speak with provider regarding home health orders to increase pain medication for pt. Call was patched to Dr Bobby Rumpf via Lynden Oxford from [facility]. Dr Bobby Rumpf returned call to [facility], with [facility]. Dr Bobby Rumpf increased MS Contin to 15mg  po q 12hr per med list.

## 2021-05-08 ENCOUNTER — Encounter: Payer: Self-pay | Admitting: Oncology

## 2021-05-08 NOTE — Telephone Encounter (Signed)
No return call 

## 2021-05-14 DEATH — deceased

## 2021-06-16 ENCOUNTER — Ambulatory Visit: Payer: Medicare Other | Admitting: Cardiology

## 2021-10-04 IMAGING — DX DG CHEST 2V
2 series · 2 of 2 positions shown · non-contrast
Comparison: None.

CLINICAL DATA: Shortness of breath

EXAM:
CHEST - 2 VIEW

[chest pa]
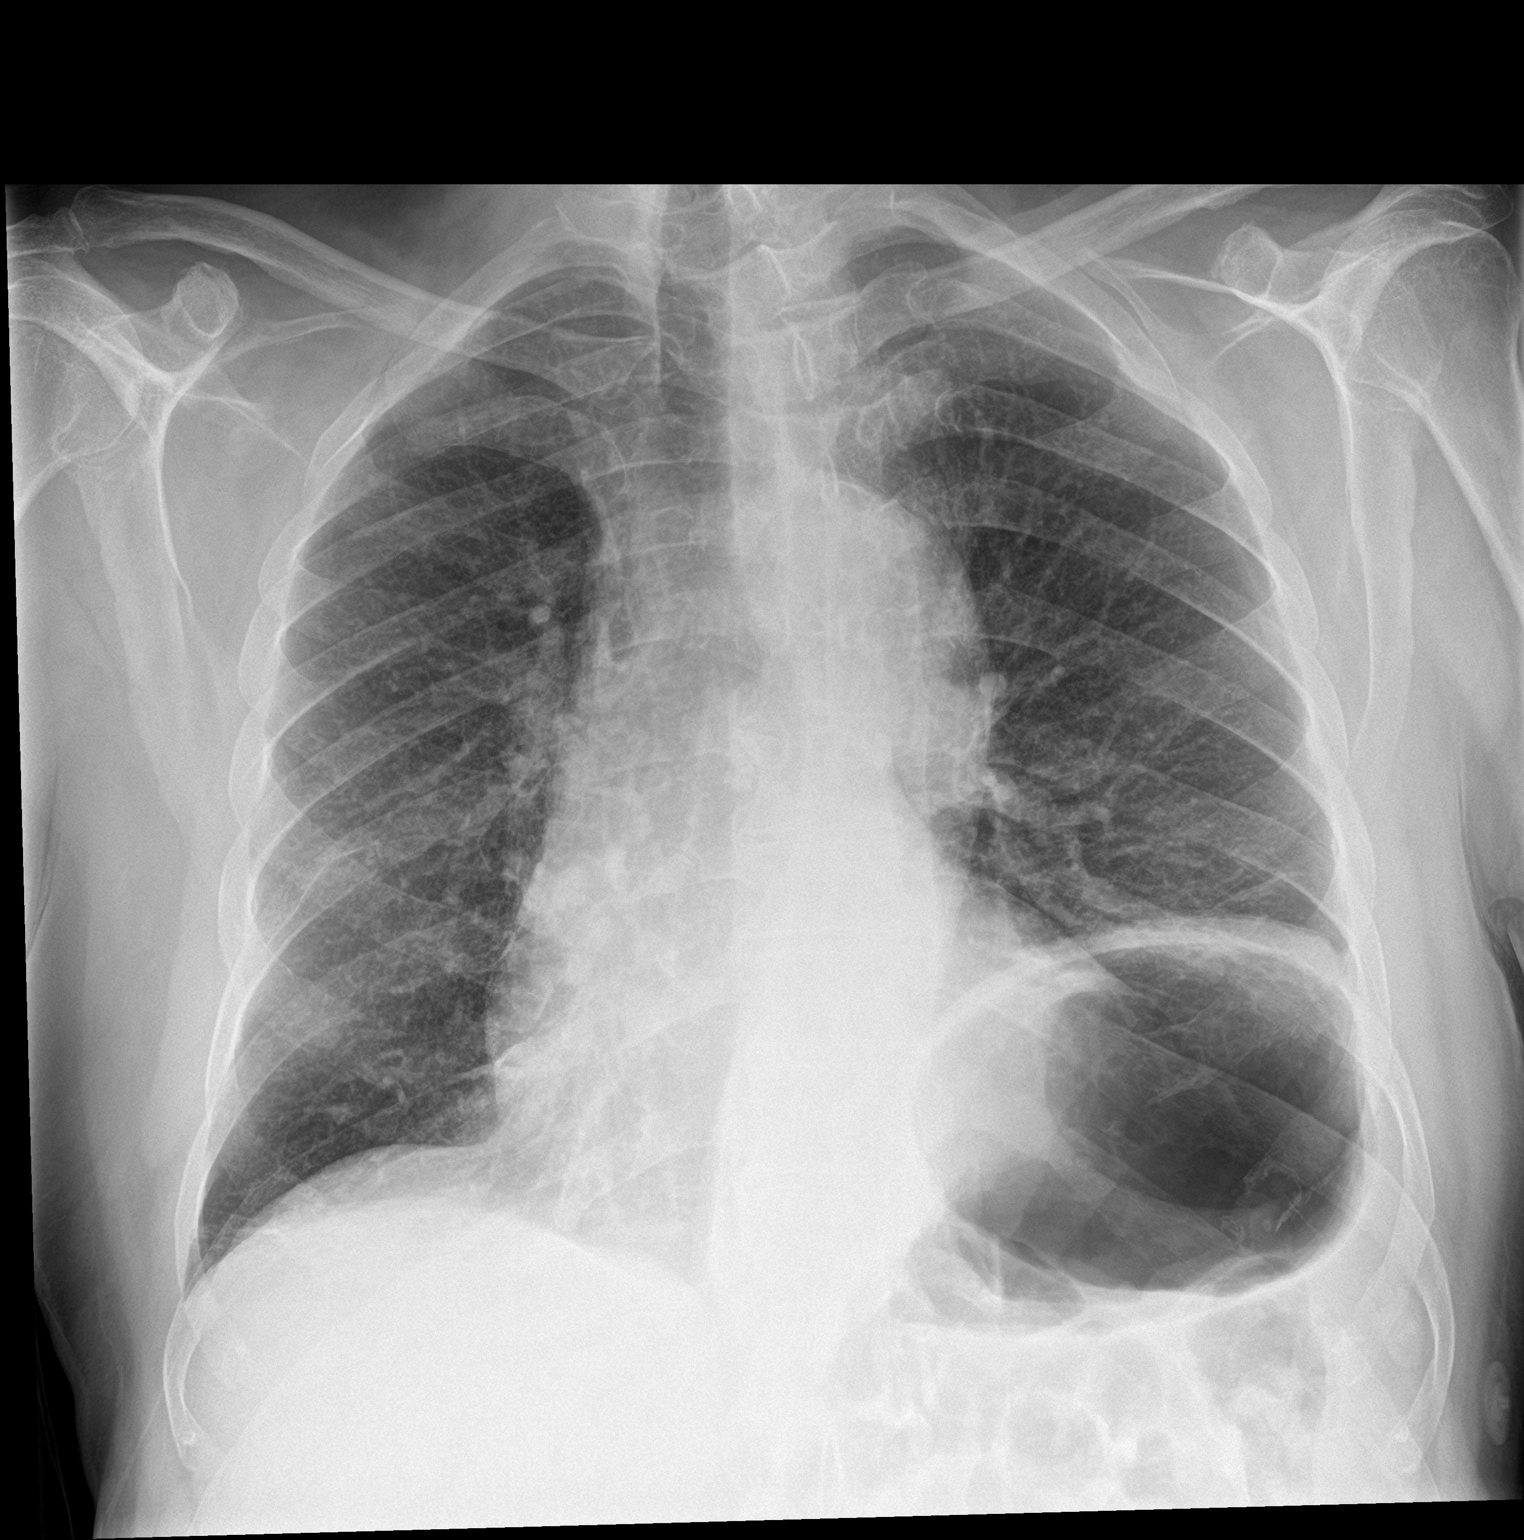

[chest lat]
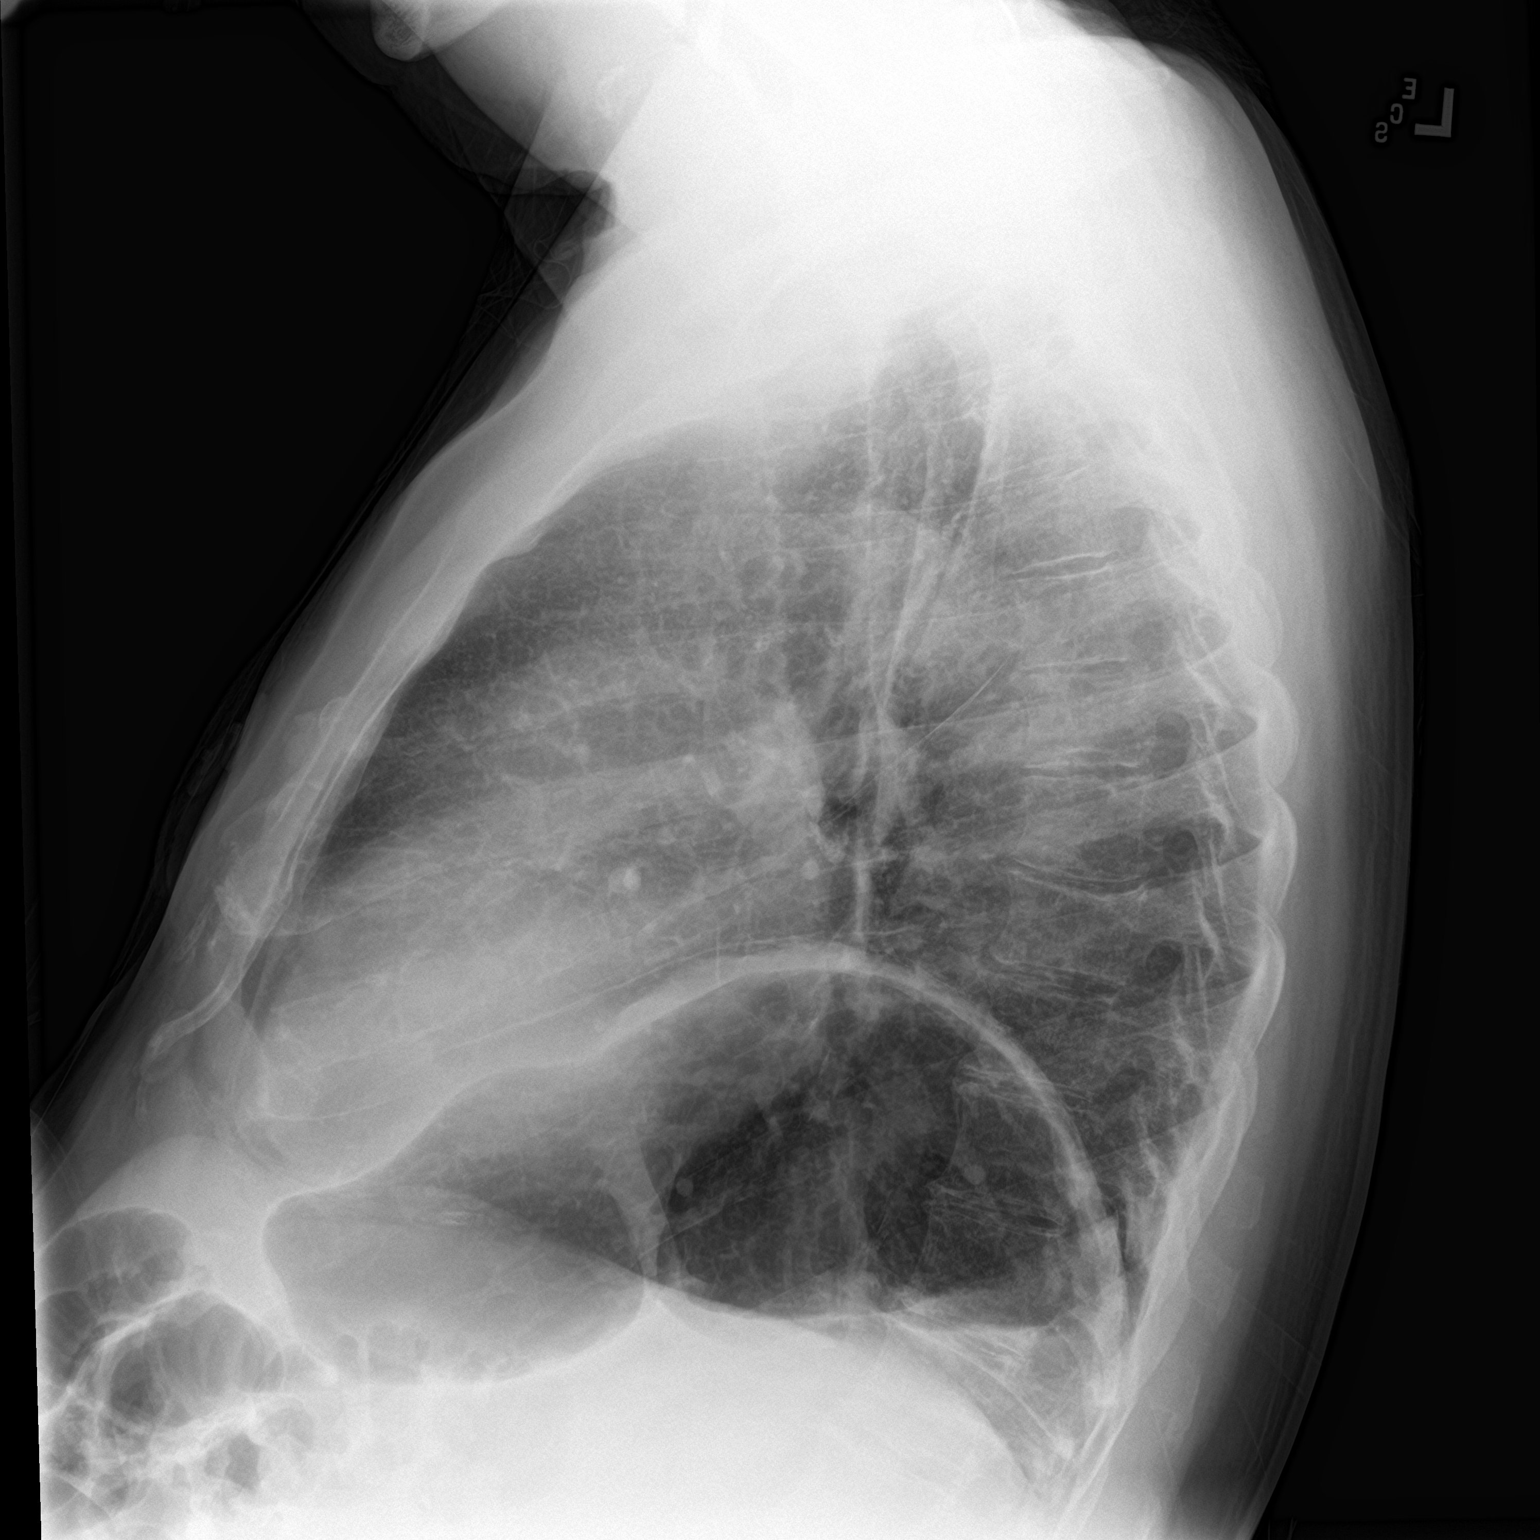

[2 of 2 positions shown; findings below may reference images not displayed]

FINDINGS: Cardiomegaly. Elevation of the left hemidiaphragm. The visualized
skeletal structures are unremarkable.
IMPRESSION: 1.  Cardiomegaly without acute abnormality of the lungs.

2.  Elevation of the left hemidiaphragm.

## 2021-10-05 IMAGING — CT CT ANGIO CHEST
2 of 6 series · 17 of 46 positions shown · IV contrast (omnipaque)
Comparison: Chest radiograph July 07, 2019

CLINICAL DATA: Shortness of breath

EXAM:
CT ANGIOGRAPHY CHEST WITH CONTRAST
TECHNIQUE: Multidetector CT imaging of the chest was performed using the
standard protocol during bolus administration of intravenous
contrast. Multiplanar CT image reconstructions and MIPs were
obtained to evaluate the vascular anatomy.
CONTRAST:  100mL OMNIPAQUE IOHEXOL 350 MG/ML SOLN

[Series 6: thins · axial · 0.77mm/px · z∈[+1019,+1325]mm · 14 of 336 slices shown]
[im 15/336  lung]
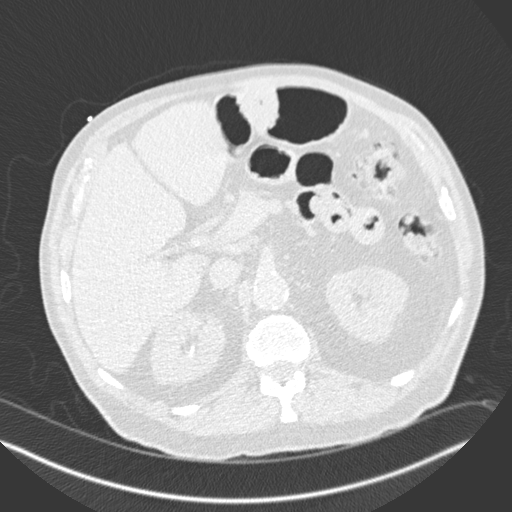
[im 44/336  soft-tissue]
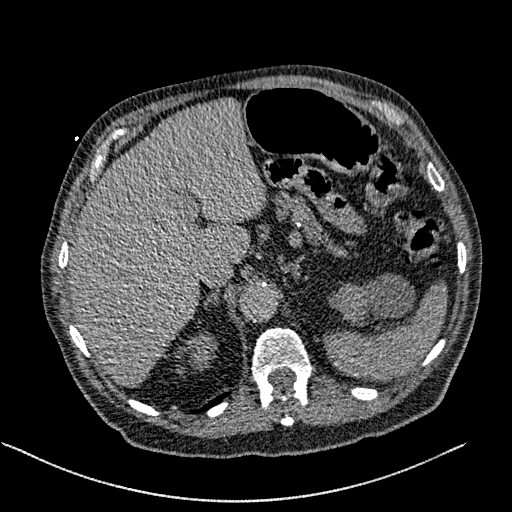
[im 59/336  lung]
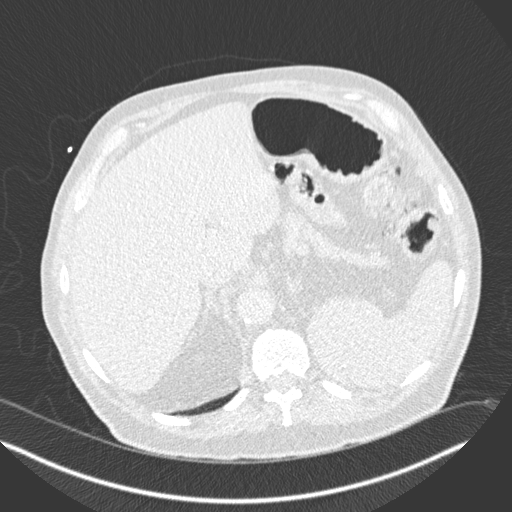
[im 88/336  soft-tissue]
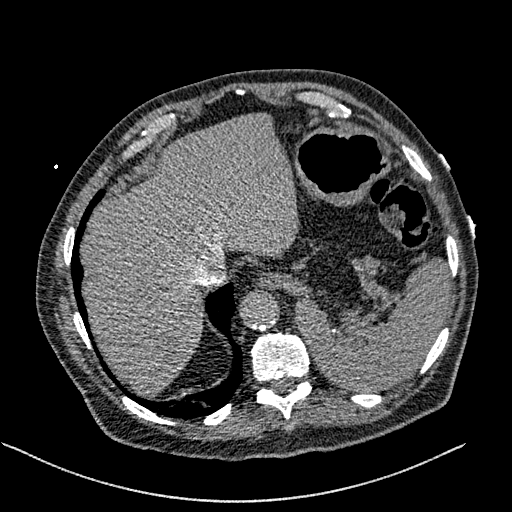
[im 117/336  lung]
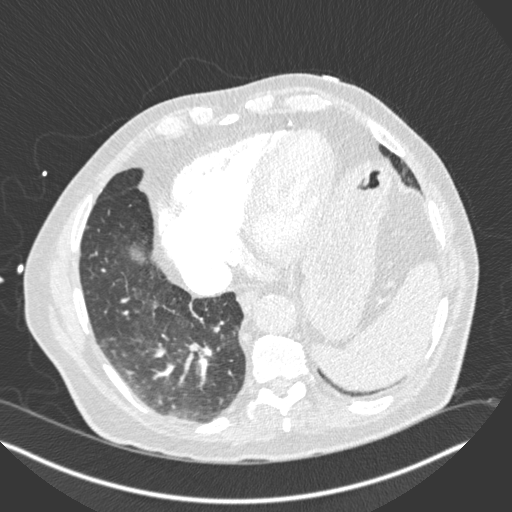
[im 132/336  soft-tissue]
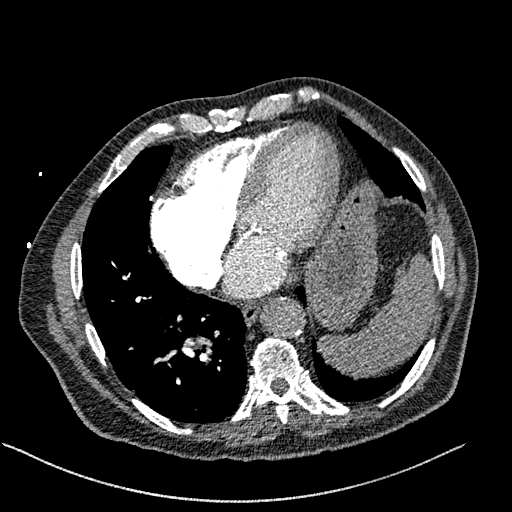
[im 161/336  lung]
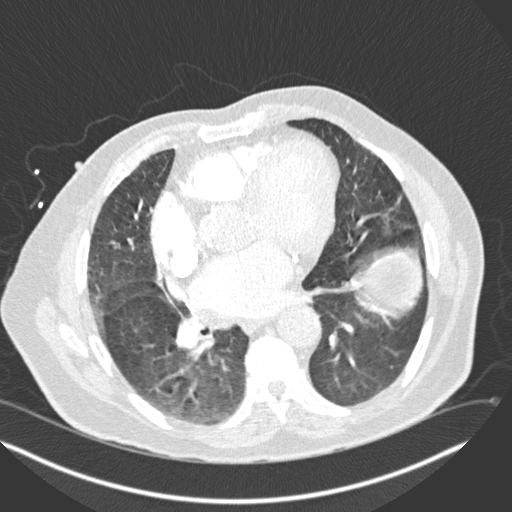
[im 175/336  soft-tissue]
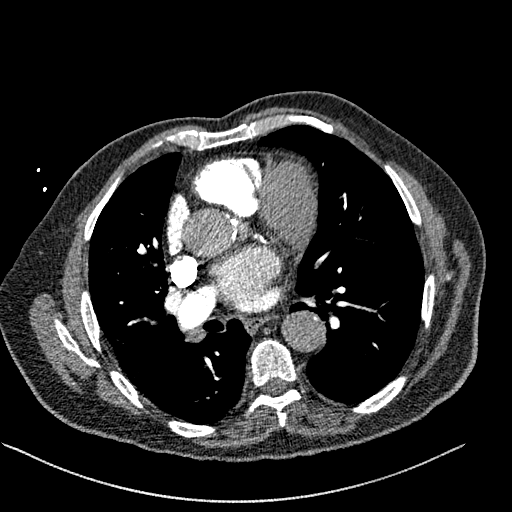
[im 204/336  lung]
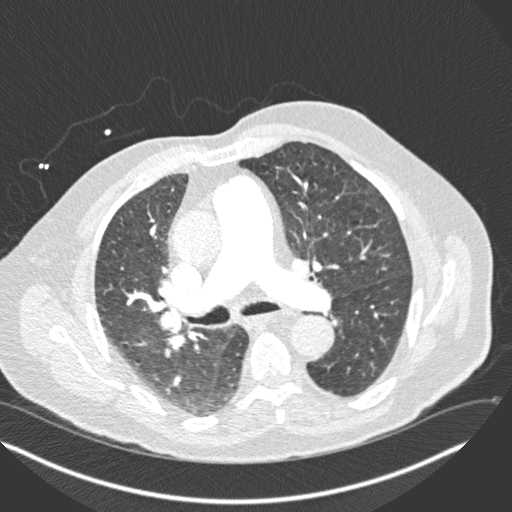
[im 219/336  soft-tissue]
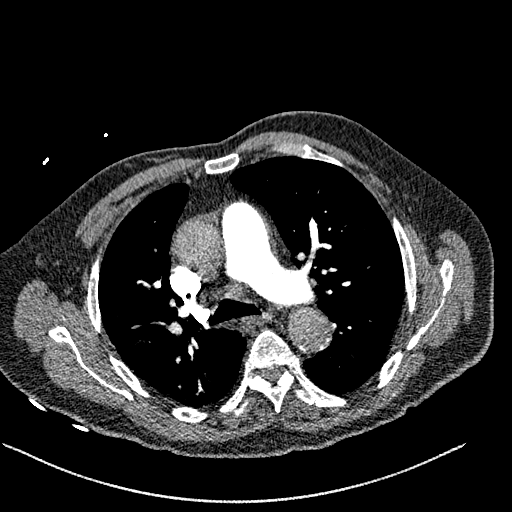
[im 248/336  lung]
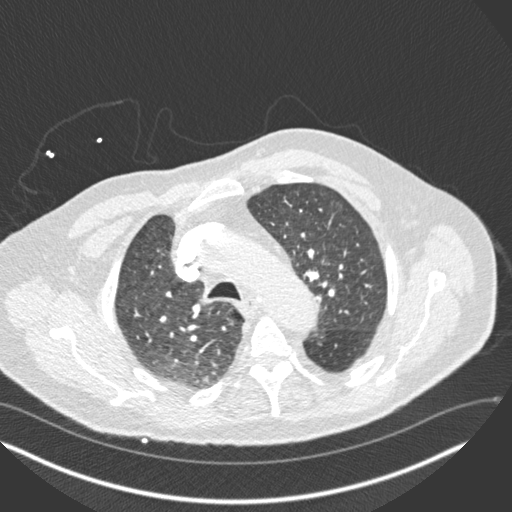
[im 277/336  soft-tissue]
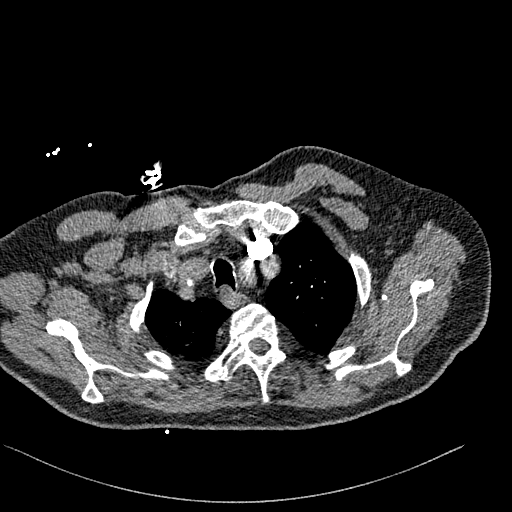
[im 292/336  lung]
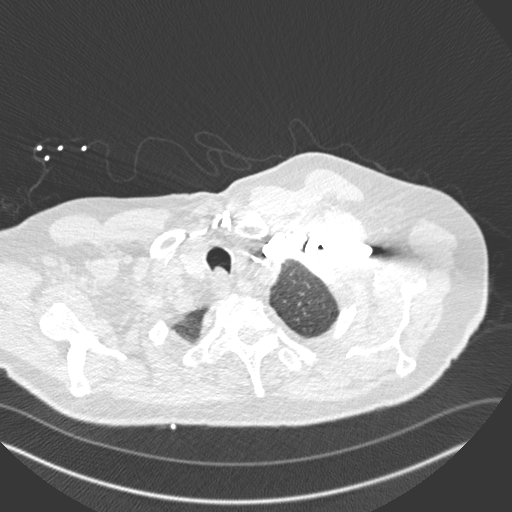
[im 321/336  soft-tissue]
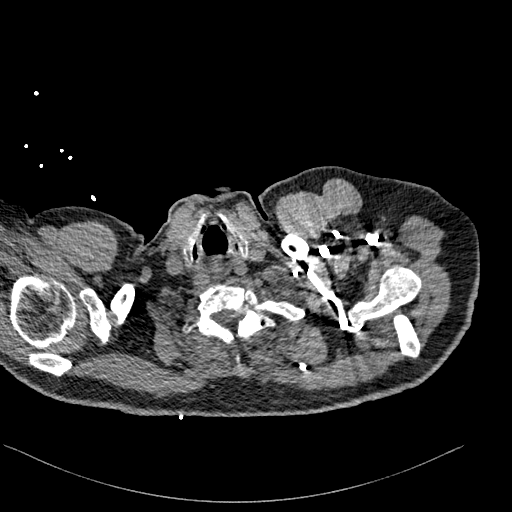

[Series 8: coronal mpr · coronal · 0.66mm/px · 3 of 159 slices shown]
[im 40/159  soft-tissue]
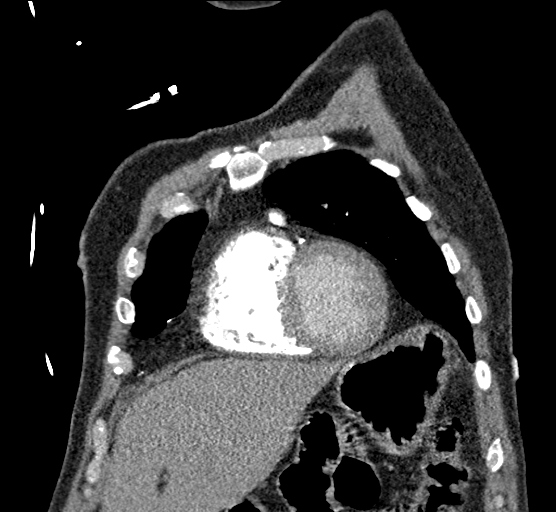
[im 80/159  soft-tissue]
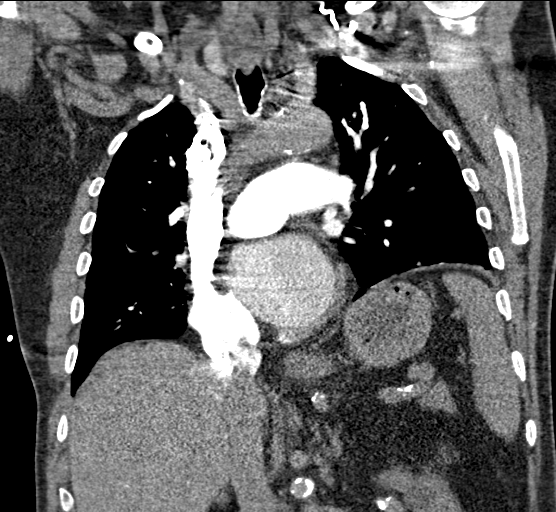
[im 119/159  soft-tissue]
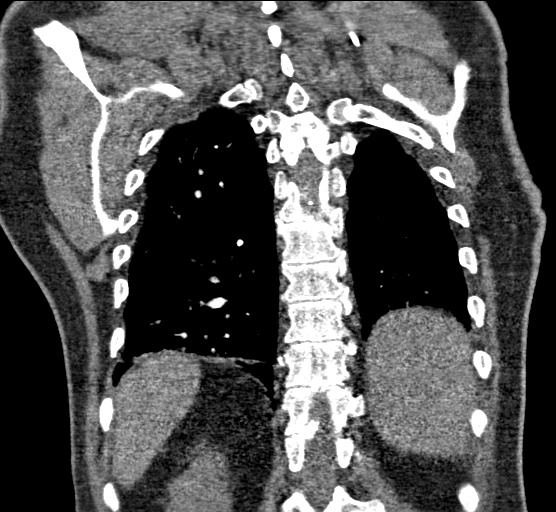

[17 of 46 positions shown; findings below may reference images not displayed]

FINDINGS: Cardiovascular: There is no demonstrable pulmonary embolus. The
ascending thoracic aorta has a maximum transverse diameter of 4.0 x
4.0 cm. There is no obvious thoracic aortic dissection. Note that
the contrast bolus is not sufficient for dissection assessment.
There are foci of aortic atherosclerosis. There is slight
calcification in proximal visualized great vessels. No pericardial
effusion or pericardial thickening is evident. There are foci of
coronary artery calcification at multiple sites.

The main pulmonary outflow tract measures 4.0 cm in diameter,
enlarged.

Mediastinum/Nodes: No thyroid lesions are evident. There are
calcified subcarinal lymph nodes. There is also a small calcified
right hilar lymph node. There are scattered subcentimeter
mediastinal lymph nodes. There is a right precarinal lymph node
measuring 1.6 x 1.2 cm. There is a right hilar lymph node measuring
1.4 x 1.1 cm. No esophageal lesions are evident.

Lungs/Pleura: There are foci of atelectatic change bilaterally.
There is no evident edema or consolidation. No pneumothorax. No
pleural effusions are evident.

Upper Abdomen: There is reflux of contrast into the inferior vena
cava and to a limited extent into hepatic veins. There are foci of
upper abdominal aortic and mesenteric vessel atherosclerotic
calcification. There is a cyst arising from the upper pole of the
left kidney laterally measuring 3.8 x 3.3 cm. Visualized upper
abdominal structures otherwise appear unremarkable.

Musculoskeletal: There are no blastic or lytic bone lesions. No
chest wall lesions are evident. There is upper thoracic
levoscoliosis.

Review of the MIP images confirms the above findings.
IMPRESSION: 1.  No demonstrable pulmonary embolus.

2. Ascending thoracic aorta measures 4.0 x 4.0 cm. There are foci of
aortic atherosclerosis as well as great vessel and coronary artery
calcification. No thoracic aortic dissection is appreciable; the
contrast bolus is not sufficient in the aorta to assess meaningfully
for potential dissection as a differential consideration. Recommend
annual imaging followup by CTA or MRA. This recommendation follows
0929 ACCF/AHA/AATS/ACR/ASA/SCA/LAGUNES/SILAVONG/GRECCO/VARIKKOLI Guidelines for the
Diagnosis and Management of Patients with Thoracic Aortic Disease.
Circulation. 0929; 121: [id number]. Aortic aneurysm NOS (VOL3I-CQJ.7)

3. Enlargement of the main pulmonary outflow tract, a finding
indicative of pulmonary arterial hypertension.

4.  No edema or consolidation.  Mild bibasilar atelectasis.

5. Mildly enlarged precarinal and right hilar lymph nodes of
uncertain etiology. Evidence of lymph node calcification at several
sites consistent with prior granulomatous disease.

6. Reflux of contrast into the inferior vena cava and hepatic veins,
a finding that may be indicative of a degree of increase in right
heart pressure.

Aortic Atherosclerosis (VOL3I-WO7.7).
